# Patient Record
Sex: Female | Born: 1950
Health system: Southern US, Community
[De-identification: ages and names within clinical notes are randomized; demographics above are authoritative.]

## PROBLEM LIST (undated history)

## (undated) DIAGNOSIS — M792 Neuralgia and neuritis, unspecified: Secondary | ICD-10-CM

## (undated) DIAGNOSIS — M48061 Spinal stenosis, lumbar region without neurogenic claudication: Secondary | ICD-10-CM

## (undated) DIAGNOSIS — Z8719 Personal history of other diseases of the digestive system: Secondary | ICD-10-CM

## (undated) DIAGNOSIS — K449 Diaphragmatic hernia without obstruction or gangrene: Secondary | ICD-10-CM

## (undated) DIAGNOSIS — Z86010 Personal history of colonic polyps: Secondary | ICD-10-CM

## (undated) DIAGNOSIS — R112 Nausea with vomiting, unspecified: Secondary | ICD-10-CM

## (undated) DIAGNOSIS — M5136 Other intervertebral disc degeneration, lumbar region: Secondary | ICD-10-CM

## (undated) DIAGNOSIS — Z973 Presence of spectacles and contact lenses: Secondary | ICD-10-CM

## (undated) DIAGNOSIS — M216X9 Other acquired deformities of unspecified foot: Secondary | ICD-10-CM

## (undated) DIAGNOSIS — D126 Benign neoplasm of colon, unspecified: Secondary | ICD-10-CM

## (undated) DIAGNOSIS — F329 Major depressive disorder, single episode, unspecified: Secondary | ICD-10-CM

## (undated) DIAGNOSIS — Z9889 Other specified postprocedural states: Secondary | ICD-10-CM

## (undated) DIAGNOSIS — Z8639 Personal history of other endocrine, nutritional and metabolic disease: Secondary | ICD-10-CM

## (undated) DIAGNOSIS — R9389 Abnormal findings on diagnostic imaging of other specified body structures: Secondary | ICD-10-CM

## (undated) DIAGNOSIS — M8580 Other specified disorders of bone density and structure, unspecified site: Secondary | ICD-10-CM

## (undated) DIAGNOSIS — I10 Essential (primary) hypertension: Secondary | ICD-10-CM

## (undated) DIAGNOSIS — G4700 Insomnia, unspecified: Secondary | ICD-10-CM

## (undated) DIAGNOSIS — K219 Gastro-esophageal reflux disease without esophagitis: Secondary | ICD-10-CM

## (undated) DIAGNOSIS — E785 Hyperlipidemia, unspecified: Secondary | ICD-10-CM

## (undated) DIAGNOSIS — Z860101 Personal history of adenomatous and serrated colon polyps: Secondary | ICD-10-CM

## (undated) DIAGNOSIS — D259 Leiomyoma of uterus, unspecified: Secondary | ICD-10-CM

## (undated) DIAGNOSIS — M7660 Achilles tendinitis, unspecified leg: Secondary | ICD-10-CM

## (undated) DIAGNOSIS — M76891 Other specified enthesopathies of right lower limb, excluding foot: Secondary | ICD-10-CM

## (undated) DIAGNOSIS — N840 Polyp of corpus uteri: Secondary | ICD-10-CM

## (undated) DIAGNOSIS — M67921 Unspecified disorder of synovium and tendon, right upper arm: Secondary | ICD-10-CM

## (undated) DIAGNOSIS — M419 Scoliosis, unspecified: Secondary | ICD-10-CM

## (undated) DIAGNOSIS — M51369 Other intervertebral disc degeneration, lumbar region without mention of lumbar back pain or lower extremity pain: Secondary | ICD-10-CM

## (undated) DIAGNOSIS — Z5189 Encounter for other specified aftercare: Secondary | ICD-10-CM

## (undated) DIAGNOSIS — D219 Benign neoplasm of connective and other soft tissue, unspecified: Secondary | ICD-10-CM

## (undated) DIAGNOSIS — F32A Depression, unspecified: Secondary | ICD-10-CM

## (undated) DIAGNOSIS — F419 Anxiety disorder, unspecified: Secondary | ICD-10-CM

## (undated) DIAGNOSIS — M4800 Spinal stenosis, site unspecified: Secondary | ICD-10-CM

## (undated) DIAGNOSIS — K222 Esophageal obstruction: Secondary | ICD-10-CM

## (undated) HISTORY — DX: Gastro-esophageal reflux disease without esophagitis: K21.9

## (undated) HISTORY — DX: Insomnia, unspecified: G47.00

## (undated) HISTORY — DX: Other specified disorders of bone density and structure, unspecified site: M85.80

## (undated) HISTORY — DX: Essential (primary) hypertension: I10

## (undated) HISTORY — PX: PARATHYROIDECTOMY: SHX19

## (undated) HISTORY — PX: UPPER GASTROINTESTINAL ENDOSCOPY: SHX188

## (undated) HISTORY — DX: Benign neoplasm of connective and other soft tissue, unspecified: D21.9

## (undated) HISTORY — DX: Neuralgia and neuritis, unspecified: M79.2

## (undated) HISTORY — DX: Scoliosis, unspecified: M41.9

## (undated) HISTORY — DX: Major depressive disorder, single episode, unspecified: F32.9

## (undated) HISTORY — PX: COLONOSCOPY: SHX174

## (undated) HISTORY — DX: Benign neoplasm of colon, unspecified: D12.6

## (undated) HISTORY — DX: Spinal stenosis, site unspecified: M48.00

## (undated) HISTORY — DX: Encounter for other specified aftercare: Z51.89

## (undated) HISTORY — DX: Achilles tendinitis, unspecified leg: M76.60

## (undated) HISTORY — DX: Anxiety disorder, unspecified: F41.9

## (undated) HISTORY — DX: Depression, unspecified: F32.A

## (undated) HISTORY — DX: Hyperlipidemia, unspecified: E78.5

---
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
## 1979-01-30 HISTORY — PX: ECTOPIC PREGNANCY SURGERY: SHX613

## 1983-01-30 HISTORY — PX: CYST EXCISION: SHX5701

## 1998-01-29 HISTORY — PX: BREAST REDUCTION SURGERY: SHX8

## 1998-01-29 HISTORY — PX: REDUCTION MAMMAPLASTY: SUR839

## 1998-03-31 ENCOUNTER — Other Ambulatory Visit: Admission: RE | Admit: 1998-03-31 | Discharge: 1998-03-31 | Payer: Self-pay | Admitting: Obstetrics and Gynecology

## 1999-10-13 ENCOUNTER — Other Ambulatory Visit: Admission: RE | Admit: 1999-10-13 | Discharge: 1999-10-13 | Payer: Self-pay | Admitting: Obstetrics and Gynecology

## 2000-10-22 ENCOUNTER — Other Ambulatory Visit: Admission: RE | Admit: 2000-10-22 | Discharge: 2000-10-22 | Payer: Self-pay | Admitting: Obstetrics and Gynecology

## 2001-10-24 ENCOUNTER — Other Ambulatory Visit: Admission: RE | Admit: 2001-10-24 | Discharge: 2001-10-24 | Payer: Self-pay | Admitting: Obstetrics and Gynecology

## 2002-10-27 ENCOUNTER — Other Ambulatory Visit: Admission: RE | Admit: 2002-10-27 | Discharge: 2002-10-27 | Payer: Self-pay | Admitting: Obstetrics and Gynecology

## 2003-02-04 ENCOUNTER — Encounter: Admission: RE | Admit: 2003-02-04 | Discharge: 2003-02-08 | Payer: Self-pay | Admitting: Specialist

## 2004-07-06 ENCOUNTER — Ambulatory Visit: Payer: Self-pay | Admitting: Gastroenterology

## 2005-01-29 HISTORY — PX: OTHER SURGICAL HISTORY: SHX169

## 2005-01-29 HISTORY — PX: SHOULDER SURGERY: SHX246

## 2006-01-29 HISTORY — PX: PARATHYROIDECTOMY: SHX19

## 2006-04-23 ENCOUNTER — Emergency Department (HOSPITAL_COMMUNITY): Admission: EM | Admit: 2006-04-23 | Discharge: 2006-04-23 | Payer: Self-pay | Admitting: Emergency Medicine

## 2008-03-10 ENCOUNTER — Ambulatory Visit (HOSPITAL_COMMUNITY): Admission: RE | Admit: 2008-03-10 | Discharge: 2008-03-10 | Payer: Self-pay | Admitting: Family Medicine

## 2008-03-11 ENCOUNTER — Ambulatory Visit: Payer: Self-pay | Admitting: Gastroenterology

## 2008-05-07 ENCOUNTER — Ambulatory Visit: Payer: Self-pay | Admitting: Gastroenterology

## 2008-09-17 ENCOUNTER — Ambulatory Visit (HOSPITAL_COMMUNITY): Admission: RE | Admit: 2008-09-17 | Discharge: 2008-09-18 | Payer: Self-pay | Admitting: General Surgery

## 2008-09-17 ENCOUNTER — Encounter (INDEPENDENT_AMBULATORY_CARE_PROVIDER_SITE_OTHER): Payer: Self-pay | Admitting: General Surgery

## 2010-02-28 NOTE — Procedures (Signed)
Summary: Colonoscopy   Colonoscopy  Procedure date:  05/07/2008  Findings:      Location:  [facility].    Procedures Next Due Date:    Colonoscopy: 04/2013  COLONOSCOPY PROCEDURE REPORT  PATIENT:  Rebecca Long, Rebecca Long  MR#:  [id number] BIRTHDATE:   [birth year], 57 yrs. old   GENDER:   female  ENDOSCOPIST:   Judie Petit T. Russella Dar, MD, [facility]    PROCEDURE DATE:  05/07/2008 PROCEDURE:  Colonoscopy, diagnostic ASA CLASS:   Class I INDICATIONS: 1) follow-up of polyp, adenomatous polyps, 12/2001.  MEDICATIONS:    Fentanyl 75 mcg IV, Versed 7 mg IV  DESCRIPTION OF PROCEDURE:   After the risks benefits and alternatives of the procedure were thoroughly explained, informed consent was obtained.  Digital rectal exam was performed and revealed no abnormalities.   The LB [id number] endoscope was introduced through the anus and advanced to the cecum, which was identified by both the appendix and ileocecal valve, without limitations.  The quality of the prep was good, using MoviPrep.  The instrument was then slowly withdrawn as the colon was fully examined. <<PROCEDUREIMAGES>>              <<OLD IMAGES>>  FINDINGS:  A normal appearing cecum, ileocecal valve, and appendiceal orifice were identified. The ascending, hepatic flexure, transverse, splenic flexure descending, sigmoid colon, and rectum appeared unremarkable. Retroflexed views in the rectum revealed no abnormalities. The time to cecum =  2.75  minutes. The scope was then withdrawn (time =  12.5  min) from the patient and the procedure completed.  COMPLICATIONS:   None  ENDOSCOPIC IMPRESSION:  1) Normal colon    REPEAT EXAM:   In 5 year(s) for Colonoscopy.   Venita Lick. Russella Dar, MD, [facility]    CC: Herb Grays MD

## 2010-02-28 NOTE — Miscellaneous (Signed)
Summary: RECALL COL..EM  Clinical Lists Changes  Medications: Added new medication of MOVIPREP 100 GM  SOLR (PEG-KCL-NACL-NASULF-NA ASC-C) As directed. - Signed Rx of MOVIPREP 100 GM  SOLR (PEG-KCL-NACL-NASULF-NA ASC-C) As directed.;  #1 x 0;  Signed;  Entered by: Clide Cliff RN;  Authorized by: Marland Kitchen LGIDEFAULTPROVIDER;  Method used: Print then Give to Patient Observations: Added new observation of ALLERGY REV: Done (03/11/2008 13:11)    Prescriptions: MOVIPREP 100 GM  SOLR (PEG-KCL-NACL-NASULF-NA ASC-C) As directed.  #1 x 0   Entered by:   Clide Cliff RN   Authorized by:   Marland Kitchen LGIDEFAULTPROVIDER   Signed by:   Clide Cliff RN on 03/11/2008   Method used:   Print then Give to Patient   RxID:   [id number]

## 2010-05-06 LAB — DIFFERENTIAL
Basophils Absolute: 0.1 10*3/uL (ref 0.0–0.1)
Basophils Relative: 1 % (ref 0–1)
Eosinophils Absolute: 0.2 10*3/uL (ref 0.0–0.7)
Eosinophils Relative: 3 % (ref 0–5)
Lymphocytes Relative: 44 % (ref 12–46)
Lymphs Abs: 2.5 10*3/uL (ref 0.7–4.0)
Monocytes Absolute: 0.4 10*3/uL (ref 0.1–1.0)
Monocytes Relative: 7 % (ref 3–12)
Neutro Abs: 2.6 10*3/uL (ref 1.7–7.7)
Neutrophils Relative %: 45 % (ref 43–77)

## 2010-05-06 LAB — BASIC METABOLIC PANEL
BUN: 14 mg/dL (ref 6–23)
CO2: 27 mEq/L (ref 19–32)
Calcium: 10 mg/dL (ref 8.4–10.5)
Chloride: 110 mEq/L (ref 96–112)
Creatinine, Ser: 0.76 mg/dL (ref 0.4–1.2)
GFR calc Af Amer: >60 mL/min (ref >60)
GFR calc non Af Amer: >60 mL/min (ref >60)
Glucose, Bld: 88 mg/dL (ref 70–99)
Potassium: 4.2 mEq/L (ref 3.5–5.1)
Sodium: 140 mEq/L (ref 135–145)

## 2010-05-06 LAB — CALCIUM
Calcium: 9 mg/dL (ref 8.4–10.5)
Calcium: 9.9 mg/dL (ref 8.4–10.5)

## 2010-05-06 LAB — CBC
HCT: 41.8 % (ref 36.0–46.0)
Hemoglobin: 14.3 g/dL (ref 12.0–15.0)
MCHC: 34.2 g/dL (ref 30.0–36.0)
MCV: 88.4 fL (ref 78.0–100.0)
Platelets: 216 10*3/uL (ref 150–400)
RBC: 4.72 MIL/uL (ref 3.87–5.11)
RDW: 13.4 % (ref 11.5–15.5)
WBC: 5.8 10*3/uL (ref 4.0–10.5)

## 2010-05-06 LAB — VITAMIN D 1,25 DIHYDROXY
Vitamin D 1, 25 (OH)2 Total: 28 pg/mL (ref 18–72)
Vitamin D2 1, 25 (OH)2: <8 pg/mL
Vitamin D3 1, 25 (OH)2: 28 pg/mL

## 2010-06-13 NOTE — Op Note (Signed)
Rebecca Long, Rebecca Long                 ACCOUNT NO.:  [id number]   MEDICAL RECORD NO.:  [id number]          PATIENT TYPE:  AMB   LOCATION:  SDS                          FACILITY:  [facility]   PHYSICIAN:  Lennie Muckle, MD      DATE OF BIRTH:  [birth year]   DATE OF PROCEDURE:  09/17/2008  DATE OF DISCHARGE:                               OPERATIVE REPORT   PREOPERATIVE DIAGNOSIS:  Hyperparathyroidism with left parathyroid  adenoma.   POSTOPERATIVE DIAGNOSIS:  Hyperparathyroidism with left parathyroid  adenoma.   PROCEDURE:  Parathyroidectomy, right superior pole of thyroid.   SURGEON:  Amber L. Freida Busman, MD   ASSISTANT:  Velora Heckler, MD   FINDINGS:  Large parathyroid at superior pole of the thyroid gland.   SPECIMEN:  Parathyroid pathology.   ESTIMATED BLOOD LOSS:  Minimal blood loss.   COMPLICATIONS:  No immediate complications.   INDICATIONS FOR PROCEDURE:  Rebecca Long is a 60 year old female who had a  mild elevation of calcium.  She had had a full workup which included  elevated PTH of 85.  She had a nuclear study which revealed uptake in  the left superior pole of the thyroid gland.  She had been on  supplementation for hypocalcemia and a complaint of neck pain.  I  presumed she had had a parathyroid adenoma.  She initially tried  conservative management, however, elected to proceed with the  parathyroidectomy.  Her informed consent was obtained after explaining  the risks of surgery including but not limited to bleeding, injury to  recurrent laryngeal nerve, and continued hypercalcemia.  All questions  were answered.   PROCEDURE IN DETAIL:  Rebecca Long was identified in the preoperative  holding area.  I marked on the left side of her neck.  She was then  taken to the operating room and placed in supine position.  After  administration of general endotracheal anesthesia, a towel roll was  placed under her neck.  Her anterior neck was prepped and draped in  usual sterile fashion.   A time-out procedure indicating the patient and  procedure were performed.  I placed an incision 2 fingerbreadths above  the sternal notch, divided the subcutaneous tissues with electrocautery,  and elevated skin flaps with the platysma muscle after dividing the  platysma muscle.  This was carried superiorly to the cricothyroid notch  and inferiorly down to the clavicle.  We divided the midline strap  muscles.  Dissected the strap muscles off the left lobe of the thyroid  gland and continued dissecting up near the superior pole of the thyroid.  We were able to find parathyroid tissue superiorly.  This was rather  large in size.  I clipped the small feeding vessels and used a harmonic  scalpel to divide close to the parathyroid gland.  Specimen was easily  removed, dissected in small pieces and sent this for frozen.  This was  consistent with a parathyroid.  The remaining specimen was sent for  permanent section.  We irrigated the wound bed, found no bleeding,  placed Surgicel within the wound bed  and closed the strap muscle in an  interrupted fashion using 3-0 Vicryl.  Platysma was reapproximated using  3-0 Vicryl.  Dermis was closed with  3-0 Vicryl and skin was closed with 4-0 Monocryl.  Steri-Strips were  placed for final dressing.  The patient was extubated and transferred to  Postanesthesia Care Unit in stable condition.  I will check a calcium  level today and in the morning likely place her on Tums 2 tablets 3  times a day and likely she will be discharged home tomorrow.      Lennie Muckle, MD  Electronically Signed     ALA/MEDQ  D:  09/17/2008  T:  09/18/2008  Job:  [id number]   cc:   Tammy R. Collins [place], M.D.

## 2012-09-09 ENCOUNTER — Encounter: Payer: Self-pay | Admitting: Sports Medicine

## 2012-09-09 ENCOUNTER — Ambulatory Visit (INDEPENDENT_AMBULATORY_CARE_PROVIDER_SITE_OTHER): Payer: No Typology Code available for payment source | Admitting: Sports Medicine

## 2012-09-09 VITALS — BP 133/88 | HR 71 | Ht 63.0 in | Wt 160.0 lb

## 2012-09-09 DIAGNOSIS — M5137 Other intervertebral disc degeneration, lumbosacral region: Secondary | ICD-10-CM

## 2012-09-09 DIAGNOSIS — M216X9 Other acquired deformities of unspecified foot: Secondary | ICD-10-CM

## 2012-09-09 DIAGNOSIS — M5136 Other intervertebral disc degeneration, lumbar region: Secondary | ICD-10-CM | POA: Insufficient documentation

## 2012-09-09 DIAGNOSIS — M48061 Spinal stenosis, lumbar region without neurogenic claudication: Secondary | ICD-10-CM | POA: Insufficient documentation

## 2012-09-09 HISTORY — DX: Other acquired deformities of unspecified foot: M21.6X9

## 2012-09-09 NOTE — Assessment & Plan Note (Signed)
To get her back into a walking program I think she is going to require custom orthotics  We need to get better arch support bilaterally and correction of the curvature on the left

## 2012-09-09 NOTE — Patient Instructions (Addendum)
Pelvic tilts - 5 x 5 breaths  Curl ups - 5 x 5 Lt Knee to Rt shoulder Rt knee to left shoulder Both knees to chest  Modified sit ups - goal is 10 and gradually lower  Find aerobic exercise that workss - try to build to 30 mins most days but be sure and get some walking  Massage is good  Heat wraps is good  Up the gabapentin to 2 at night Gradually wean ambien  Do these for 6 weeks

## 2012-09-09 NOTE — Progress Notes (Signed)
Patient ID: Rebecca Long, female   DOB: [birth year], 62 y.o.   MRN: [id number]  This patient has been trouble with low back pain for more than 2 years She originally had seen Dr. Shelle Iron at [facility] orthopedics for right hip pain. She did have problems with a labral tear and trochanteric bursitis as well as some hip muscle tendinitis that were documented by MR arthrogram. Treatment for the hip conditions did not get rid of her pain that radiated into her buttocks and down into her legs. She underwent further evaluation which revealed that she had significant lumbar spinal stenosis. She also had multilevel degenerative disc changes. She had some acquired left convex scoliosis. At several levels she had some nerve root irritation.  For the past 2 years she continues to use gabapentin just at nighttime. She has Doing certain things but is now unable to walk very far without pain. She recently took a trip to [place] and coming back as severe flare of her low back pain. This led her to come seek additional evaluation and treatment.  She did have an upper respiratory infection about one week before the flare of back pain.  She also had a urinary tract infection treated with Cipro at about the time of back pain.  Dr. Jolee Ewing treats her for her medical problems and her medications are as listed.  PE  Pleasant female in no acute distress she has moderate centripetal obesity  Back extension to 20 creates some pain Back flexion is full Left lateral bending is pain free Right lateral band creates pain down her right outside leg Rotation of the low back is pain-free  She can do heel toe and tandem walk without problems  Straight leg raise is negative bilaterally  Hip abduction and flexion strength is strong bilaterally  There is atrophy that extends into both feet particularly of the intrinsic muscles of the left foot There is abnormal rotation and inversion of the left foot suggesting some chronic  peroneal nerve weakness Bilaterally she has a cavus foot  Pelvis is anteriorly rotated, right  Leg lengths are equal

## 2012-09-09 NOTE — Assessment & Plan Note (Signed)
I suggested increasing her dose of gabapentin  We may consider adding tramadol but she does not feel that her pain is significant at this time

## 2012-09-09 NOTE — Assessment & Plan Note (Signed)
We need to begin a series of consistent exercises to improve the position of her pelvis, strengthen her abdominal muscles, preserved her aerobic capacity  I think she is a good candidate for conservative care

## 2012-10-09 ENCOUNTER — Ambulatory Visit: Payer: Self-pay | Admitting: Sports Medicine

## 2012-10-21 ENCOUNTER — Ambulatory Visit (INDEPENDENT_AMBULATORY_CARE_PROVIDER_SITE_OTHER): Payer: No Typology Code available for payment source | Admitting: Sports Medicine

## 2012-10-21 ENCOUNTER — Encounter: Payer: Self-pay | Admitting: Sports Medicine

## 2012-10-21 VITALS — BP 146/65 | HR 79 | Ht 63.0 in | Wt 160.0 lb

## 2012-10-21 DIAGNOSIS — M549 Dorsalgia, unspecified: Secondary | ICD-10-CM

## 2012-10-21 DIAGNOSIS — Q667 Congenital pes cavus, unspecified foot: Secondary | ICD-10-CM

## 2012-10-21 DIAGNOSIS — M48061 Spinal stenosis, lumbar region without neurogenic claudication: Secondary | ICD-10-CM

## 2012-10-21 NOTE — Progress Notes (Signed)
  Subjective:    Patient ID: Rebecca Long, female    DOB: 03-Apr-1950, 62 y.o.   MRN: [id number]  HPI Ms. Horvath is a 62 year old female who is here to f/u on her back pain. She was last seen about 6 weeks ago at which time her gabapentin dose was increased from 300 to 600 mg qHS. She thought this would help her sleep and not use ambien, but the increased dose of gabapentin did not seem to help. She does, however, feel that her back has gotten a lot better. She has been able to increase her walking duration to 30 minutes, and has been working on pilates. She will occasionally have a twinge in her back if she sits in a weird position, but she no longer has any numbness or tingling in her legs or shooting pains down to her toes.   She is concerned about her foot because she was told she has some nerve damage. She had orthotics made a few years ago that she does not wear because the hard plastic bothers her. Instead, she got some over the counter orthotics which seem to help. She did recently buy a new pair of walking shoes.    Review of Systems     Objective:   Physical Exam  Constitutional: She is oriented to person, place, and time. She appears well-developed and well-nourished.  HENT:  Head: Normocephalic and atraumatic.  Eyes: Conjunctivae are normal.  Pulmonary/Chest: Effort normal.  Neurological: She is alert and oriented to person, place, and time.  Psychiatric: She has a normal mood and affect. Her behavior is normal.   No midline or paraspinal back pain. Normal bilateral hip ROM and strength.  3+ patellar reflexes, 2+ plantar reflexes. Equal sensation to light touch bilaterally.  Pes cavus deformity bilaterally, L > R.  Curvature of left foot with varus deformity. Tarsometatarsal bossing bilaterally, L > R. Medial displacement of 5th rays with gap formation between left rays 1&2 Atrophy of left ankle and foot/ less on RT       Assessment & Plan:  62 year old female with back pain  secondary to degenerative disc disease and spinal stenosis, also with pes cavus and left foot deformity 2/2 to L5 nerve root compression  - Back pain improved, will continue 300 mg gabapentin qHS. Titrate up to 600 mg if desired at next visit. Epidural CSI did not help last time, will not pursue at this time - Provided patient with scaphoid pads to support arch in her shoes today - She will return on Thursday or Friday with her walking shoes and we will place either arch pads vs. full insoles for better support  F/U in 3 months

## 2012-10-21 NOTE — Assessment & Plan Note (Signed)
Cont the conservative care program  See visit note  HEP/ gabapentin  Needs better foot support

## 2013-02-02 LAB — BASIC METABOLIC PANEL
BUN: 17 mg/dL (ref 4–21)
Creatinine: 0.7 mg/dL (ref 0.5–1.1)
Potassium: 4.4 mmol/L (ref 3.4–5.3)
Sodium: 139 mmol/L (ref 137–147)

## 2013-02-02 LAB — CBC AND DIFFERENTIAL
HCT: 44 % (ref 36–46)
Hemoglobin: 14.3 g/dL (ref 12.0–16.0)
Neutrophils Absolute: 3 /uL
Platelets: 252 10*3/uL (ref 150–399)
WBC: 5.3 10^3/mL

## 2013-02-02 LAB — HEPATIC FUNCTION PANEL
ALT: 62 U/L — AB (ref 7–35)
AST: 29 U/L (ref 13–35)
Alkaline Phosphatase: 44 U/L (ref 25–125)

## 2013-02-02 LAB — LIPID PANEL
Cholesterol: 283 mg/dL — AB (ref 0–200)
HDL: 67 mg/dL (ref 35–70)
LDL Cholesterol: 175 mg/dL
Triglycerides: 160 mg/dL (ref 40–160)

## 2013-02-02 LAB — TSH: TSH: 2.57 u[IU]/mL (ref 0.41–5.90)

## 2013-02-02 LAB — HEMOGLOBIN A1C: Hgb A1c MFr Bld: 5.4 % (ref 4.0–6.0)

## 2013-03-12 ENCOUNTER — Encounter: Payer: Self-pay | Admitting: Gastroenterology

## 2013-05-05 ENCOUNTER — Ambulatory Visit: Payer: No Typology Code available for payment source | Admitting: Sports Medicine

## 2013-06-17 ENCOUNTER — Encounter: Payer: Self-pay | Admitting: Gastroenterology

## 2013-08-14 ENCOUNTER — Ambulatory Visit (AMBULATORY_SURGERY_CENTER): Payer: BC Managed Care – PPO | Admitting: *Deleted

## 2013-08-14 VITALS — Ht 64.0 in | Wt 169.8 lb

## 2013-08-14 DIAGNOSIS — Z860100 Personal history of colon polyps, unspecified: Secondary | ICD-10-CM

## 2013-08-14 MED ORDER — MOVIPREP 100 G PO SOLR: ORAL | Status: DC

## 2013-08-14 NOTE — Progress Notes (Signed)
No allergies to eggs or soy. No problems with anesthesia.  Pt given Emmi instructions for colonoscopy  No oxygen use  No diet drug use  

## 2013-08-17 ENCOUNTER — Encounter: Payer: Self-pay | Admitting: Gastroenterology

## 2013-08-28 ENCOUNTER — Ambulatory Visit (AMBULATORY_SURGERY_CENTER): Payer: BC Managed Care – PPO | Admitting: Gastroenterology

## 2013-08-28 ENCOUNTER — Encounter: Payer: Self-pay | Admitting: Gastroenterology

## 2013-08-28 VITALS — BP 118/75 | HR 61 | Temp 98.0°F | Resp 19 | Ht 64.0 in | Wt 169.0 lb

## 2013-08-28 DIAGNOSIS — Z860100 Personal history of colon polyps, unspecified: Secondary | ICD-10-CM

## 2013-08-28 MED ORDER — SODIUM CHLORIDE 0.9 % IV SOLN: 500.0000 mL | INTRAVENOUS | Status: DC

## 2013-08-28 NOTE — Progress Notes (Signed)
Report to PACU, RN, vss, BBS= Clear.  

## 2013-08-28 NOTE — Patient Instructions (Signed)
YOU HAD AN ENDOSCOPIC PROCEDURE TODAY AT THE [facility]: Refer to the procedure report that was given to you for any specific questions about what was found during the examination.  If the procedure report does not answer your questions, please call your gastroenterologist to clarify.  If you requested that your care partner not be given the details of your procedure findings, then the procedure report has been included in a sealed envelope for you to review at your convenience later.  YOU SHOULD EXPECT: Some feelings of bloating in the abdomen. Passage of more gas than usual.  Walking can help get rid of the air that was put into your GI tract during the procedure and reduce the bloating. If you had a lower endoscopy (such as a colonoscopy or flexible sigmoidoscopy) you may notice spotting of blood in your stool or on the toilet paper. If you underwent a bowel prep for your procedure, then you may not have a normal bowel movement for a few days.  DIET: Your first meal following the procedure should be a light meal and then it is ok to progress to your normal diet.  A half-sandwich or bowl of soup is an example of a good first meal.  Heavy or fried foods are harder to digest and may make you feel nauseous or bloated.  Likewise meals heavy in dairy and vegetables can cause extra gas to form and this can also increase the bloating.  Drink plenty of fluids but you should avoid alcoholic beverages for 24 hours.  ACTIVITY: Your care partner should take you home directly after the procedure.  You should plan to take it easy, moving slowly for the rest of the day.  You can resume normal activity the day after the procedure however you should NOT DRIVE or use heavy machinery for 24 hours (because of the sedation medicines used during the test).    SYMPTOMS TO REPORT IMMEDIATELY: A gastroenterologist can be reached at any hour.  During normal business hours, 8:30 AM to 5:00 PM Monday through Friday,  call [phone].  After hours and on weekends, please call the GI answering service at [phone] who will take a message and have the physician on call contact you.   Following lower endoscopy (colonoscopy or flexible sigmoidoscopy):  Excessive amounts of blood in the stool  Significant tenderness or worsening of abdominal pains  Swelling of the abdomen that is new, acute  Fever of 100F or higher  FOLLOW UP: If any biopsies were taken you will be contacted by phone or by letter within the next 1-3 weeks.  Call your gastroenterologist if you have not heard about the biopsies in 3 weeks.  Our staff will call the home number listed on your records the next business day following your procedure to check on you and address any questions or concerns that you may have at that time regarding the information given to you following your procedure. This is a courtesy call and so if there is no answer at the home number and we have not heard from you through the emergency physician on call, we will assume that you have returned to your regular daily activities without incident.  SIGNATURES/CONFIDENTIALITY: You and/or your care partner have signed paperwork which will be entered into your electronic medical record.  These signatures attest to the fact that that the information above on your After Visit Summary has been reviewed and is understood.  Full responsibility of the confidentiality of this   discharge information lies with you and/or your care-partner.  Recommendations High fiber diet with liberal fluid intake. Repeat colonoscopy in 5 years.

## 2013-08-28 NOTE — Op Note (Signed)
[place]  [address] [place], [id number]   COLONOSCOPY PROCEDURE REPORT  PATIENT: Rebecca Long, Rebecca Long  MR#: [id number] BIRTHDATE: 05/08/1950 , 62  yrs. old GENDER: Female ENDOSCOPIST: Ladene Artist, MD, [facility] PROCEDURE DATE:  08/28/2013 PROCEDURE:   Colonoscopy, surveillance First Screening Colonoscopy - Avg.  risk and is 50 yrs.  old or older - No.  Prior Negative Screening - Now for repeat screening. N/A  History of Adenoma - Now for follow-up colonoscopy & has been > or = to 3 yrs.  Yes hx of adenoma.  Has been 3 or more years since last colonoscopy.  Polyps Removed Today? No.  Recommend repeat exam, <10 yrs? Yes.  High risk (family or personal hx). ASA CLASS:   Class III INDICATIONS:Patient's personal history of adenomatous colon polyps.  MEDICATIONS: MAC sedation, administered by CRNA and propofol (Diprivan) 240mg  IV DESCRIPTION OF PROCEDURE:   After the risks benefits and alternatives of the procedure were thoroughly explained, informed consent was obtained.  A digital rectal exam revealed no abnormalities of the perianal region.   The LB [id number] endoscope was introduced through the anus and advanced to the cecum, which was identified by both the appendix and ileocecal valve. No adverse events experienced.   The quality of the prep was excellent, using MoviPrep  The instrument was then slowly withdrawn as the colon was fully examined.  COLON FINDINGS: Mild diverticulosis was noted in the sigmoid colon. The colon was otherwise normal.  There was no diverticulosis, inflammation, polyps or cancers unless previously stated. Retroflexed views revealed no abnormalities. The time to cecum=3 minutes 34 seconds.  Withdrawal time=7 minutes 24 seconds.  The scope was withdrawn and the procedure completed.  COMPLICATIONS: There were no complications.  ENDOSCOPIC IMPRESSION: 1.   Mild diverticulosis in the sigmoid colon 2.   The colon was otherwise  normal  RECOMMENDATIONS: 1.  High fiber diet with liberal fluid intake. 2.  Repeat Colonoscopy in 5 years.  eSigned:  Ladene Artist, MD, [facility] 08/28/2013 11:19 AM   cc: Florina Ou, MD

## 2013-08-31 ENCOUNTER — Telehealth: Payer: Self-pay | Admitting: *Deleted

## 2013-08-31 NOTE — Telephone Encounter (Signed)
  Follow up Call-  Call back number 08/28/2013  Post procedure Call Back phone  # [phone]  Permission to leave phone message Yes     Patient questions:  Do you have a fever, pain , or abdominal swelling? No. Pain Score  0 *  Have you tolerated food without any problems? Yes.    Have you been able to return to your normal activities? Yes.    Do you have any questions about your discharge instructions: Diet   No. Medications  No. Follow up visit  No.  Do you have questions or concerns about your Care? No.  Actions: * If pain score is 4 or above: No action needed, pain <4.

## 2013-09-14 ENCOUNTER — Encounter: Payer: Self-pay | Admitting: Gastroenterology

## 2014-04-29 LAB — HEPATIC FUNCTION PANEL
ALT: 29 U/L (ref 7–35)
AST: 22 U/L (ref 13–35)
Alkaline Phosphatase: 53 U/L (ref 25–125)

## 2014-04-29 LAB — BASIC METABOLIC PANEL
BUN: 15 mg/dL (ref 4–21)
Creatinine: 0.8 mg/dL (ref 0.5–1.1)
Glucose: 88 mg/dL
Potassium: 4.2 mmol/L (ref 3.4–5.3)
Sodium: 139 mmol/L (ref 137–147)

## 2014-04-29 LAB — LIPID PANEL
Cholesterol: 172 mg/dL (ref 0–200)
HDL: 62 mg/dL (ref 35–70)
LDL Cholesterol: 89 mg/dL
Triglycerides: 113 mg/dL (ref 40–160)

## 2014-04-29 LAB — TSH: TSH: 1.49 u[IU]/mL (ref 0.41–5.90)

## 2014-10-05 DIAGNOSIS — Z85828 Personal history of other malignant neoplasm of skin: Secondary | ICD-10-CM | POA: Diagnosis not present

## 2014-10-05 DIAGNOSIS — D485 Neoplasm of uncertain behavior of skin: Secondary | ICD-10-CM | POA: Diagnosis not present

## 2014-10-05 DIAGNOSIS — D1722 Benign lipomatous neoplasm of skin and subcutaneous tissue of left arm: Secondary | ICD-10-CM | POA: Diagnosis not present

## 2014-10-05 DIAGNOSIS — D2262 Melanocytic nevi of left upper limb, including shoulder: Secondary | ICD-10-CM | POA: Diagnosis not present

## 2014-10-05 DIAGNOSIS — L821 Other seborrheic keratosis: Secondary | ICD-10-CM | POA: Diagnosis not present

## 2014-10-05 DIAGNOSIS — D225 Melanocytic nevi of trunk: Secondary | ICD-10-CM | POA: Diagnosis not present

## 2014-10-06 ENCOUNTER — Encounter: Payer: Self-pay | Admitting: Family Medicine

## 2014-10-06 ENCOUNTER — Ambulatory Visit (INDEPENDENT_AMBULATORY_CARE_PROVIDER_SITE_OTHER): Payer: Medicare Other | Admitting: Family Medicine

## 2014-10-06 VITALS — BP 133/85 | HR 63 | Temp 97.7°F | Resp 18 | Ht 62.5 in | Wt 174.0 lb

## 2014-10-06 DIAGNOSIS — M4806 Spinal stenosis, lumbar region: Secondary | ICD-10-CM

## 2014-10-06 DIAGNOSIS — F418 Other specified anxiety disorders: Secondary | ICD-10-CM | POA: Insufficient documentation

## 2014-10-06 DIAGNOSIS — M810 Age-related osteoporosis without current pathological fracture: Secondary | ICD-10-CM | POA: Insufficient documentation

## 2014-10-06 DIAGNOSIS — Z0000 Encounter for general adult medical examination without abnormal findings: Secondary | ICD-10-CM

## 2014-10-06 DIAGNOSIS — I10 Essential (primary) hypertension: Secondary | ICD-10-CM | POA: Diagnosis not present

## 2014-10-06 DIAGNOSIS — R5383 Other fatigue: Secondary | ICD-10-CM

## 2014-10-06 DIAGNOSIS — M8580 Other specified disorders of bone density and structure, unspecified site: Secondary | ICD-10-CM

## 2014-10-06 DIAGNOSIS — M48061 Spinal stenosis, lumbar region without neurogenic claudication: Secondary | ICD-10-CM

## 2014-10-06 DIAGNOSIS — N393 Stress incontinence (female) (male): Secondary | ICD-10-CM | POA: Insufficient documentation

## 2014-10-06 DIAGNOSIS — E2839 Other primary ovarian failure: Secondary | ICD-10-CM

## 2014-10-06 MED ORDER — ATORVASTATIN CALCIUM 10 MG PO TABS: 10.0000 mg | ORAL_TABLET | Freq: Every day | ORAL | Status: DC

## 2014-10-06 MED ORDER — LOSARTAN POTASSIUM 100 MG PO TABS: 100.0000 mg | ORAL_TABLET | Freq: Every day | ORAL | Status: DC

## 2014-10-06 NOTE — Progress Notes (Signed)
Pre visit review using our clinic review tool, if applicable. No additional management support is needed unless otherwise documented below in the visit note. 

## 2014-10-06 NOTE — Progress Notes (Signed)
Subjective:    Patient ID: Rebecca Long, female    DOB: February 25, 1950, 64 y.o.   MRN: [id number]  HPI  Patient transferring from [facility] in [place]. All PMH, FH, SurgHx, Allergies and social hx entered. Awaiting requested records for immunization history.   Anxiety/depression: evaluated by Jobie Quaker, PA (psych) every 6 months, stable, meds are prescribed through psychiatry office include: BuSpar 15 mg 2 times a day, Prozac 40 mg daily and Ambien 5 mg when necessary daily at bedtime.  Spinal stenosis/DDD lumbar: Patient follows with Dr. Oneida Alar, stable currently, with flares few times a year, driving long periods of time exacerbations symptoms. She frequently will experience radiation of pain to her right leg.  MRI 09/11/2012 of lumbar spine and right hip arthrogram are scanned into the system and have been reviewed. Patient with extensive degenerative disc disease, nerve encroachment and lumbar spinal stenosis, also with mildly prepare a right hip at that time. Patient does attempt to keep active by swimming 2 times a week. She reports when she does have a flare she needs to rest, take prednisone, and use Vicodin 5/325.   Hypertension: Patient states that her medication has been unchanged for quite some time. She currently takes certain 782 mg daily. She does not routinely take her blood pressures at home, but she does have a cough and is able to do so if needed. She does not watch the salt content in her diet. She attempts to stay active and swims twice a week, exercises difficult with her spinal stenosis/degenerative disc disease. She has not been walking as much, and reports she is going get back into that now that the weather is cooling down. She denies any chest pain, visual changes, orthopnea, dizziness or LE edema.   Fatigue: Patient states that she's had fatigue for some time. She has had low vitamin D in the past, and attempts to supplement daily with her vitamin D. She also suffers  from depression, and sees psychiatry and feels stable. She has gained weight recently, but states that she is not as active as she used to be. She denies any heat or cold intolerance. She denies any polyphagia or polydipsia.  Osteopenia:She has a history of low vitamin D and osteopenia, which she attempts to take daily vitamin D and calcium. She states her last bone density scan was when she was about 25, and at that time she states that they told her she had osteopenia. She no longer is a smoker. She is postmenopausal, therefore she is estrogen deficient. She denies any fractures.  Mild incontinence: Patient states that she does have mild stress incontinence. She reports urinary leakage with coughing and sneezing. She states it's very mild, and she currently does not wear a pad, she also denies ever trying any type of therapy. She does feel like this has become worse over the last year. She has had urinary tract infections in the past, but nothing recently.  Health maintenance:  Colonoscopy:UTD, h/o of polyps, no fhx of colon cancer. Every 5 years, due 2020. Mammogram: UTD, history breast reduction breast biopsy, no family history of breast cancer. Last mammogram April 2016, patient reported as normal, no EMR record. Cervical cancer screening: UTD. Patient with no abnormal Paps, last Pap within the last 3 years. Negative HPV. No further cervical cancer screening indicated. Patient not sexually active. Immunizations: Unknown tetanus and pneumonia vaccination records. Records have been requested from prior PCP. Infectious disease screening: Unknown infectious disease screening, records have been  requested from prior PCP   Review of Systems  Constitutional: Positive for fatigue. Negative for fever, chills, diaphoresis, activity change, appetite change and unexpected weight change.  HENT: Negative for congestion, ear discharge, facial swelling, hearing loss, postnasal drip, sinus pressure, sneezing,  sore throat, tinnitus, trouble swallowing and voice change.   Eyes: Negative for redness and visual disturbance.  Respiratory: Negative for cough, chest tightness, shortness of breath and wheezing.   Cardiovascular: Negative for chest pain, palpitations and leg swelling.  Gastrointestinal: Negative for nausea, vomiting, abdominal pain, diarrhea, constipation, blood in stool and abdominal distention.  Endocrine: Negative for polyphagia and polyuria.  Genitourinary: Negative for dysuria, hematuria, flank pain, decreased urine volume, vaginal bleeding, vaginal discharge, difficulty urinating and pelvic pain.  Musculoskeletal: Positive for back pain. Negative for neck pain.  Skin: Negative for rash and wound.  Allergic/Immunologic: Negative.  Negative for environmental allergies and food allergies.  Neurological: Negative for dizziness, tremors, syncope, weakness, numbness and headaches.  Hematological: Negative.   Psychiatric/Behavioral: Negative for suicidal ideas, hallucinations, behavioral problems, confusion and sleep disturbance. The patient is not nervous/anxious.        Objective:   Physical Exam BP 133/85 mmHg  Pulse 63  Temp(Src) 97.7 F (36.5 C) (Temporal)  Resp 18  Ht 5' 2.5" (1.588 m)  Wt 174 lb (78.926 kg)  BMI 31.30 kg/m2  SpO2 98% Gen: Afebrile. No acute distress. Nontoxic in appearance, well-developed, well-nourished Caucasian female. Very pleasant, cooperative with exam. HENT: AT. . Bilateral TM visualized and normal in appearance. MMM. Bilateral nares without erythema or swelling. Throat without erythema or exudates.  Eyes:Pupils Equal Round Reactive to light, Extraocular movements intact,  Conjunctiva without redness, discharge or icterus. Neck: Supple, no lymphadenopathy, no thyromegaly CV: RRR no murmur, no edema, +2/4 P posterior tibialis pulses Chest: CTAB, no wheeze or crackles Abd: Soft. Round. NTND. BS present. No Masses palpated.  Skin: No rashes, purpura  or petechiae.  Neuro/MSK: Normal gait. PERLA. EOMi. Alert and Oriented x3.  Muscle strength 5/5 UE/LE extremity.  Psych: Normal affect, dress and demeanor. Normal speech. Normal thought content and judgment..      Assessment & Plan:   1. Essential hypertension, benign - Stable on current medication, refills prescribed, continue current regimen. - Losartan 100 mg daily #90, 3 refills - Patient to continue monitoring blood pressure occasionally at home, watch for signs and symptoms of hyper or hypotension. - Discussed goal blood pressure should be below 140/90. - Follow-up in six-month to one-year, dependent on home blood pressure readings.  2. Depression with anxiety - Patient followed every 6 months with psychiatry physician assistant. - Currently stable. - All medications are prescribed through her psychiatrist office. BuSpar, Prozac, Ambien.  3. Stress incontinence - Seems to be mild, patient understands if this increases, she experiences frequent urinary tract infections or she would like to discuss possible treatments she will make an appointment. - Discussed today possible future urology evaluation versus pessaries. Versus behavior modifications.  4. Osteoporosis - h/o of osteopenia on prior bone density scan not available in the system. Patient is female, Caucasian, Estrogen deficient and former smoker. - Continue calcium and vitamin D supplementation - Will recheck vitamin D level with other future labs, once we get records from prior office. They have been requested for the second time today. - Repeat bone density ordered today. Consider preventive or treatment therapy if osteopenic/worsening scan.  5. Other fatigue - Once records from office become available, or if we do not receive records within  2 weeks, will obtain vitamin D, TSH, CBC and CMP.  6. Spinal stenosis of lumbar region - Reviewed MRI reports, and prior management. - Currently stable. Continue tolerable  physical exercise, swimming is an excellent form of exercise. - We'll continue to follow up with Dr. Oneida Alar as needed, if desired. May also be managed here.  - If increasing symptoms or flares become more frequent will need repeat imaging and possible neurosurgical referral. Patient's goal is to refrain from surgical referral as long as possible.  7. Health care maintenance Colonoscopy:UTD, due 2020. Mammogram: UTD; April 2017 due Cervical cancer screening: UTD. No further cervical cancer screening indicated greater than 32 of age, with normal Paps. Immunizations: Unknown tetanus and pneumonia vaccination records. Records have been requested from prior PCP. Patient will be called if need of vaccination, and will schedule nurse visit for immunization. Flu vaccination given today. Infectious disease screening: Unknown infectious disease screening, records have been requested from prior PCP. Patient will be called if need of vaccination, and will schedule nurse visit for immunization.

## 2014-10-06 NOTE — Patient Instructions (Signed)

## 2014-10-11 ENCOUNTER — Other Ambulatory Visit: Payer: Self-pay | Admitting: Neurosurgery

## 2014-10-11 DIAGNOSIS — G930 Cerebral cysts: Secondary | ICD-10-CM

## 2014-10-25 ENCOUNTER — Telehealth: Payer: Self-pay | Admitting: Family Medicine

## 2014-10-25 NOTE — Telephone Encounter (Signed)
LMOM for pt to CB.  

## 2014-10-25 NOTE — Telephone Encounter (Signed)
Patient aware.

## 2014-10-25 NOTE — Telephone Encounter (Signed)
Have you received records yet?  Please advise.

## 2014-10-25 NOTE — Telephone Encounter (Signed)
I understand she would like to have her PNA vaccination. We discussed I would like to receive her prior immunization records first, as I do not which one or when she had a vaccination to PNA, if any. I have not received these records as of yet. - - She can also attempt to get these records for [place].  - If we get them we will call her immediately to schedule vaccinations if needed.  - It is not recommended to repeat the same vaccination within 5 years, and there are two different types which need to be timed at least 1 year apart form each other.  - If she is certain she has not had a PNA vac in 5 years then we can schedule a PCV13 and then in 1 year she can have the [place].

## 2014-10-25 NOTE — Telephone Encounter (Signed)
Pt. Would like a pneumo shot. Dr. Raoul Pitch said she wanted to see Renley's records from Dr. Greta Doom office before giving it. Have we received these records and can she have a vaccine Please call her.

## 2014-10-26 ENCOUNTER — Encounter: Payer: Self-pay | Admitting: Family Medicine

## 2014-10-26 ENCOUNTER — Telehealth: Payer: Self-pay | Admitting: Family Medicine

## 2014-10-26 DIAGNOSIS — R5382 Chronic fatigue, unspecified: Secondary | ICD-10-CM

## 2014-10-26 LAB — VITAMIN D 25 HYDROXY (VIT D DEFICIENCY, FRACTURES)
Vitamin D, 25-OH, D2: 1
Vitamin D, 25-OH, D3: 44
Vitamin D, 25-OH, Total: 45

## 2014-10-26 LAB — VITAMIN B12

## 2014-10-26 NOTE — Telephone Encounter (Signed)
LMOM for pt to CB.  

## 2014-10-26 NOTE — Telephone Encounter (Signed)
Pt aware.  She states that she thinks she has had a lot of these labs checked within this year but she will double check before repeating labs.   She states she will let me know on Friday when she comes into office for injections.

## 2014-10-26 NOTE — Telephone Encounter (Signed)
Pt: scheduled an appt for immunizations on 9/30 at 2 pm with the nurse.  - I have received her records and it appears she is due for both the PCV13 (and then PPSV23 1 year later) and the TDAP vaccinations (last Td was 10/05/2004, every 10 years). Please make pt aware. - I have also reviewed recent labs and it appears her thyroid was recently tested. Since she did have complaints of fatigue I would like to obtain a repeat vit D, B12, ESR, ANA and CBC to investigate further.  - we can collect these on her nurse visit date, if abnormal results would need to followup with provider visit to discuss. -  If normal results would follow up on other chronic issues/annual visit end of March/April (her annual visit with her prior PCP was 04/29/2014), unless  Needed sooner.

## 2014-10-29 ENCOUNTER — Ambulatory Visit
Admission: RE | Admit: 2014-10-29 | Discharge: 2014-10-29 | Disposition: A | Discharge status: Home / Self Care | Payer: Medicare Other | Source: Ambulatory Visit | Attending: Neurosurgery | Admitting: Neurosurgery

## 2014-10-29 ENCOUNTER — Ambulatory Visit (INDEPENDENT_AMBULATORY_CARE_PROVIDER_SITE_OTHER): Payer: Medicare Other | Admitting: *Deleted

## 2014-10-29 DIAGNOSIS — G930 Cerebral cysts: Secondary | ICD-10-CM

## 2014-10-29 DIAGNOSIS — Z23 Encounter for immunization: Secondary | ICD-10-CM | POA: Diagnosis not present

## 2014-11-04 DIAGNOSIS — H2513 Age-related nuclear cataract, bilateral: Secondary | ICD-10-CM | POA: Diagnosis not present

## 2014-11-05 DIAGNOSIS — G930 Cerebral cysts: Secondary | ICD-10-CM | POA: Diagnosis not present

## 2014-11-05 DIAGNOSIS — Z6830 Body mass index (BMI) 30.0-30.9, adult: Secondary | ICD-10-CM | POA: Diagnosis not present

## 2015-01-20 DIAGNOSIS — F3341 Major depressive disorder, recurrent, in partial remission: Secondary | ICD-10-CM | POA: Diagnosis not present

## 2015-01-20 DIAGNOSIS — F419 Anxiety disorder, unspecified: Secondary | ICD-10-CM | POA: Diagnosis not present

## 2015-04-30 DIAGNOSIS — Z1231 Encounter for screening mammogram for malignant neoplasm of breast: Secondary | ICD-10-CM | POA: Diagnosis not present

## 2015-05-03 ENCOUNTER — Encounter: Payer: Self-pay | Admitting: Family Medicine

## 2015-05-03 DIAGNOSIS — Z1239 Encounter for other screening for malignant neoplasm of breast: Secondary | ICD-10-CM | POA: Insufficient documentation

## 2015-05-17 DIAGNOSIS — M779 Enthesopathy, unspecified: Secondary | ICD-10-CM | POA: Diagnosis not present

## 2015-05-17 DIAGNOSIS — M79671 Pain in right foot: Secondary | ICD-10-CM | POA: Diagnosis not present

## 2015-05-26 ENCOUNTER — Ambulatory Visit: Payer: Medicare Other | Admitting: Podiatry

## 2015-07-25 ENCOUNTER — Ambulatory Visit (INDEPENDENT_AMBULATORY_CARE_PROVIDER_SITE_OTHER): Payer: Medicare Other | Admitting: Podiatry

## 2015-07-25 ENCOUNTER — Encounter: Payer: Self-pay | Admitting: Podiatry

## 2015-07-25 ENCOUNTER — Ambulatory Visit (INDEPENDENT_AMBULATORY_CARE_PROVIDER_SITE_OTHER): Payer: Medicare Other

## 2015-07-25 VITALS — BP 130/78 | HR 75 | Resp 16

## 2015-07-25 DIAGNOSIS — M79673 Pain in unspecified foot: Secondary | ICD-10-CM

## 2015-07-25 DIAGNOSIS — M2041 Other hammer toe(s) (acquired), right foot: Secondary | ICD-10-CM

## 2015-07-25 DIAGNOSIS — M779 Enthesopathy, unspecified: Secondary | ICD-10-CM

## 2015-07-25 DIAGNOSIS — D169 Benign neoplasm of bone and articular cartilage, unspecified: Secondary | ICD-10-CM

## 2015-07-25 MED ORDER — TRIAMCINOLONE ACETONIDE 10 MG/ML IJ SUSP
10.0000 mg | Freq: Once | INTRAMUSCULAR | Status: AC
  Administered 2015-07-25: 10 mg

## 2015-07-27 NOTE — Progress Notes (Signed)
Subjective:     Patient ID: Rebecca Long, female   DOB: 30-Apr-1950, 65 y.o.   MRN: [id number]  HPI patient presents stating that she's getting a lot of pain in the right foot worse than the left with inflammation around the base of the second toe right over left. States that it's been going on for a while and worse recently   Review of Systems  All other systems reviewed and are negative.      Objective:   Physical Exam  Constitutional: She is oriented to person, place, and time.  Cardiovascular: Intact distal pulses.   Musculoskeletal: Normal range of motion.  Neurological: She is oriented to person, place, and time.  Skin: Skin is warm.  Nursing note and vitals reviewed.  neurovascular status found to be intact muscle strength adequate range of motion within normal limits. Patient's noted to have inflammation and fluid around the second MPJ right over left and spurring formation on top of the foot that's moderately painful. Patient states that this is worsened gradually over the last few months and is making it difficult to walk comfortably. She is noted to have good digital perfusion and is well oriented 3     Assessment:     Inflammatory capsulitis right over left second MPJ with fluid buildup around the joint and moderate exostotic formation    Plan:     H&P and x-ray reviewed with patient. Today I went ahead and I did a proximal nerve block I was able to aspirate the second MPJ and got out amount of clear fluid and injected with a quarter cc dexamethasone Kenalog and applied thick plantar padding. Patient had x-rays reviewed and will be seen back for [place] to recheck and may require further treatment depending on response  X-ray report indicated that there is no indications of stress fracture or advanced arthritic condition

## 2015-08-08 ENCOUNTER — Ambulatory Visit (INDEPENDENT_AMBULATORY_CARE_PROVIDER_SITE_OTHER): Payer: Medicare Other | Admitting: Podiatry

## 2015-08-08 ENCOUNTER — Encounter: Payer: Self-pay | Admitting: Podiatry

## 2015-08-08 DIAGNOSIS — M2041 Other hammer toe(s) (acquired), right foot: Secondary | ICD-10-CM | POA: Diagnosis not present

## 2015-08-08 DIAGNOSIS — M779 Enthesopathy, unspecified: Secondary | ICD-10-CM | POA: Diagnosis not present

## 2015-08-09 NOTE — Progress Notes (Signed)
Subjective:     Patient ID: Rebecca Long, female   DOB: 01/22/1951, 65 y.o.   MRN: [id number]  HPI patient presents stating my right foot is feeling a lot better   Review of Systems     Objective:   Physical Exam Neurovascular status intact muscle strength adequate significant improvement of the right foot with digital deformity and inflamed capsule    Assessment:     Improved capsulitis right    Plan:     Advised on continued pad usage not going barefoot supportive shoes and possibility for orthotics in the future. Explained orthotic usage and reappoint as needed

## 2015-09-26 DIAGNOSIS — F419 Anxiety disorder, unspecified: Secondary | ICD-10-CM | POA: Diagnosis not present

## 2015-09-26 DIAGNOSIS — F3341 Major depressive disorder, recurrent, in partial remission: Secondary | ICD-10-CM | POA: Diagnosis not present

## 2015-10-18 ENCOUNTER — Ambulatory Visit (INDEPENDENT_AMBULATORY_CARE_PROVIDER_SITE_OTHER): Payer: Medicare Other

## 2015-10-18 VITALS — BP 124/72 | HR 58 | Ht 63.0 in | Wt 163.8 lb

## 2015-10-18 DIAGNOSIS — Z0000 Encounter for general adult medical examination without abnormal findings: Secondary | ICD-10-CM

## 2015-10-18 DIAGNOSIS — Z23 Encounter for immunization: Secondary | ICD-10-CM | POA: Diagnosis not present

## 2015-10-18 NOTE — Progress Notes (Signed)
Subjective:   Rebecca Long is a 64 y.o. female who presents for an Initial Medicare Annual Wellness Visit.  The Patient was informed that the wellness visit is to identify future health risk and educate and initiate measures that can reduce risk for increased disease through the lifespan.   Describes health as fair, good or great? Good.   Review of Systems    No ROS.  Medicare Wellness Visit.  Cardiac Risk Factors include: advanced age (>89men, >89 women);dyslipidemia;hypertension;obesity (BMI >30kg/m2) Health Maintenance Pap: Within the last 3 years, normal.  Mammo: 04/2015, normal (dense). Recall 1 year. Mother recently diagnosed with breast cancer (lumpectomy 03/2014).  Dexa: Age 18, osteopenia. Taking Vitamin D and Calcium (occasionally). Bone Density ordered 09/2014. Will call GI to schedule, number provided.  CCS: Colonoscopy 07/2013, mild diverticulosis. Recall 5 years.  Eye Exam: Followed yearly by optometrist, cannot recall name at this time. Dental: Followed every 6 months, UTD. Dr. Marlyce Huge    Sleep patterns: Typically sleeps 8-9 hours nightly (uses Ambien), up x 1 to void. Uses night guard to prevent teeth grinding.   Home Safety/Smoke Alarms and environment: Lives at home with husband and pet. Feels safe at home, smoke detectors in place, multi-level home, no difficulty with steps currently.  Firearm Safety: No firearms in home.  Seat Belt Safety/Bike Helmet: Wears seatbelt.       Objective:    Today's Vitals   10/18/15 1049  BP: 124/72  Pulse: (!) 58  SpO2: 97%  Weight: 163 lb 12.8 oz (74.3 kg)  Height: 5\' 3"  (1.6 m)   Body mass index is 29.02 kg/m.   Current Medications (verified) Outpatient Encounter Prescriptions as of 10/18/2015  Medication Sig  . aspirin 81 MG chewable tablet Chew 81 mg by mouth daily.  Marland Kitchen atorvastatin (LIPITOR) 10 MG tablet Take 1 tablet (10 mg total) by mouth daily.  . calcium acetate (PHOSLO) 667 MG capsule Take by mouth as needed.   . cholecalciferol (VITAMIN D) 1000 UNITS tablet Take 1,000 Units by mouth daily.  Marland Kitchen FLUoxetine (PROZAC) 40 MG capsule Take 40 mg by mouth daily.  Marland Kitchen losartan (COZAAR) 100 MG tablet Take 1 tablet (100 mg total) by mouth daily.  . Multiple Vitamin (MULTIVITAMIN) tablet Take 1 tablet by mouth daily.  Marland Kitchen zolpidem (AMBIEN) 5 MG tablet Take 5 mg by mouth at bedtime as needed.  . [DISCONTINUED] busPIRone (BUSPAR) 15 MG tablet Take 15 mg by mouth 2 (two) times daily. Reported on 07/25/2015   No facility-administered encounter medications on file as of 10/18/2015.     Allergies (verified) Latex and Polysporin [bacitracin-polymyxin b]   History: Past Medical History:  Diagnosis Date  . Achilles tendinitis   . Anxiety   . Blood transfusion without reported diagnosis 1980   ectopic pregnancy  . Colon polyps    Benign, routine colonoscopy found  . Depression   . Hyperlipidemia   . Hypertension   . Insomnia   . Nerve pain    back  . Osteopenia   . Scoliosis   . Spinal stenosis    Past Surgical History:  Procedure Laterality Date  . BREAST REDUCTION SURGERY Bilateral 2000  . BREAST SURGERY    . CYST EXCISION  1985   head and rt axilla  . ECTOPIC PREGNANCY SURGERY  1981  . frozen shoulder Right 2007  . PARATHYROIDECTOMY  2008   Family History  Problem Relation Age of Onset  . Heart disease Father 31  . Cancer Mother   .  Colon cancer Neg Hx    Social History   Occupational History  . Not on file.   Social History Main Topics  . Smoking status: Former Smoker    Quit date: 01/30/1991  . Smokeless tobacco: Never Used  . Alcohol use No  . Drug use: No  . Sexual activity: No    Tobacco Counseling Counseling given: Not Answered   Activities of Daily Living In your present state of health, do you have any difficulty performing the following activities: 10/18/2015  Hearing? N  Vision? N  Difficulty concentrating or making decisions? N  Walking or climbing stairs? N  Dressing  or bathing? N  Doing errands, shopping? N  Preparing Food and eating ? N  Using the Toilet? N  In the past six months, have you accidently leaked urine? Y  Do you have problems with loss of bowel control? N  Managing your Medications? N  Managing your Finances? N  Housekeeping or managing your Housekeeping? N  Some recent data might be hidden    Immunizations and Health Maintenance Immunization History  Administered Date(s) Administered  . Influenza,inj,Quad PF,36+ Mos 10/06/2014  . Influenza-Unspecified 10/15/2012  . Pneumococcal Conjugate-13 10/29/2014  . Td 10/24/2004  . Tdap 10/29/2014   Health Maintenance Due  Topic Date Due  . Hepatitis C Screening  07/23/1950  . DEXA SCAN  09/29/2009  . INFLUENZA VACCINE  08/30/2015    Patient Care Team: Ma Hillock, DO as PCP - General (Family Medicine) Wallene Huh, DPM as Consulting Physician (Podiatry) Sydnee Levans, MD as Consulting Physician (Dermatology)  Indicate any recent Medical Services you may have received from other than [facility] providers in the past year (date may be approximate).     Assessment:   This is a routine wellness examination for Rebecca Long. Physical assessment deferred to PCP.   Hearing/Vision screen Hearing Screening Comments: Hears conversational tones without difficulty.  Vision Screening Comments: Followed by optometry   Dietary issues and exercise activities discussed: Current Exercise Habits: Home exercise routine, Type of exercise: Other - see comments Environmental consultant (type of tennis)), Time (Minutes): > 60, Frequency (Times/Week): 3, Weekly Exercise (Minutes/Week): 0, Exercise limited by: cardiac condition(s);orthopedic condition(s) Diet (meal preparation, eat out, water intake, caffeinated beverages, dairy products, fruits and vegetables): Eats at least 3 meals/day, Drinks 3 cups coffee/day. Diet Coke,20 oz/daily. Sweet tea. Does not like water.  Breakfast: Skips breakfast. Occasionally will  have smoothie.  Lunch: Leftovers, eats out 2-3 x per week. Dinner:  Dance movement psychotherapist, vegetables, salads, starch/grain.    Goals    . Weight (lb) < 150 lb (68 kg)          Plans to increase walking to mailbox, continue playing "pickle ball", will incorporate resistant bands at least twice a week.       Depression Screen PHQ 2/9 Scores 10/18/2015  PHQ - 2 Score 0    Fall Risk Fall Risk  10/18/2015  Falls in the past year? No  Risk for fall due to : History of fall(s)  Risk for fall due to (comments): Fell down steps-concussion 2011   Ad8 score reviewed for issues:  Issues making decisions: no  Less interest in hobbies / activities:no  Repeats questions, stories (family complaining):no  Trouble using ordinary gadgets (microwave, computer, phone):no  Forgets the month or year: no  Mismanaging finances: no  Remembering appts:no  Daily problems with thinking and/or memory: no Ad8 score is=0     Cognitive Function: MMSE - Mini Mental  State Exam 10/18/2015  Not completed: (No Data)       Screening Tests Health Maintenance  Topic Date Due  . Hepatitis C Screening  25-Jul-1950  . DEXA SCAN  09/29/2009  . INFLUENZA VACCINE  08/30/2015  . PNA vac Low Risk Adult (2 of 2 - PPSV23) 10/29/2015  . MAMMOGRAM  04/29/2017  . COLONOSCOPY  08/29/2018  . TETANUS/TDAP  10/28/2024  . ZOSTAVAX  Completed      Plan:     Begin to eat heart healthy diet (full of fruits, vegetables, whole grains, lean protein, water--limit salt, fat, and sugar intake) and increase physical activity as tolerated.  Begin doing brain stimulating activities (puzzles, reading, adult coloring books, staying active) to keep memory sharp.   Schedule Bone Density Study, call [phone] [facility])  Follow up with Dr. Raoul Pitch in 7-10 days for follow up on chronic conditions.   Bring a copy of your advanced directives to your next office visit.  Patient reports occasional (twice a month) heart  racing that last less than 30 seconds. Patient is not concerned, thinks it is probably anxiety related. Will discuss with Dr. Raoul Pitch at next office visit (2 weeks).   During the course of the visit, Rebecca Long was educated and counseled about the following appropriate screening and preventive services:   Vaccines to include Pneumoccal, Influenza, Hepatitis B, Td, Zostavax, HCV  Cardiovascular disease screening  Colorectal cancer screening  Bone density screening  Diabetes screening  Glaucoma screening  Mammography/PAP  Nutrition counseling  Smoking cessation counseling  Patient Instructions (the written plan) were given to the patient.    Gerilyn Nestle, RN   10/18/2015   Electronically Signed by: Howard Pouch, DO [facility] [place]

## 2015-10-18 NOTE — Patient Instructions (Addendum)
Begin to eat heart healthy diet (full of fruits, vegetables, whole grains, lean protein, water--limit salt, fat, and sugar intake) and increase physical activity as tolerated.  Begin doing brain stimulating activities (puzzles, reading, adult coloring books, staying active) to keep memory sharp.   Schedule Bone Density Study, call [phone] [facility]) Follow up with Dr. Raoul Pitch in 7-10 days for follow up on chronic conditions.   Bring a copy of your advanced directives to your next office visit.     Bone Densitometry Bone densitometry is an imaging test that uses a special X-ray to measure the amount of calcium and other minerals in your bones (bone density). This test is also known as a bone mineral density test or dual-energy X-ray absorptiometry (DXA). The test can measure bone density at your hip and your spine. It is similar to having a regular X-ray. You may have this test to:  Diagnose a condition that causes weak or thin bones (osteoporosis).  Predict your risk of a broken bone (fracture).  Determine how well osteoporosis treatment is working. LET [facility] CARE PROVIDER KNOW ABOUT:  Any allergies you have.  All medicines you are taking, including vitamins, herbs, eye drops, creams, and over-the-counter medicines.  Previous problems you or members of your family have had with the use of anesthetics.  Any blood disorders you have.  Previous surgeries you have had.  Medical conditions you have.  Possibility of pregnancy.  Any other medical test you had within the previous 14 days that used contrast material. RISKS AND COMPLICATIONS Generally, this is a safe procedure. However, problems can occur and may include the following:  This test exposes you to a very small amount of radiation.  The risks of radiation exposure may be greater to unborn children. BEFORE THE PROCEDURE  Do not take any calcium supplements for 24 hours before having the test. You  can otherwise eat and drink what you usually do.  Take off all metal jewelry, eyeglasses, dental appliances, and any other metal objects. PROCEDURE  You may lie on an exam table. There will be an X-ray generator below you and an imaging device above you.  Other devices, such as boxes or braces, may be used to position your body properly for the scan.  You will need to lie still while the machine slowly scans your body.  The images will show up on a computer monitor. AFTER THE PROCEDURE You may need more testing at a later time.   This information is not intended to replace advice given to you by your health care provider. Make sure you discuss any questions you have with your health care provider.   Document Released: 02/07/2004 Document Revised: 02/05/2014 Document Reviewed: 06/25/2013 Elsevier Interactive Patient Education 2016 [employer]   Fat and Cholesterol Restricted Diet Getting too much fat and cholesterol in your diet may cause health problems. Following this diet helps keep your fat and cholesterol at normal levels. This can keep you from getting sick. WHAT TYPES OF FAT SHOULD I CHOOSE?  Choose monosaturated and polyunsaturated fats. These are found in foods such as olive oil, canola oil, flaxseeds, walnuts, almonds, and seeds.  Eat more omega-3 fats. Good choices include salmon, mackerel, sardines, tuna, flaxseed oil, and ground flaxseeds.  Limit saturated fats. These are in animal products such as meats, butter, and cream. They can also be in plant products such as palm oil, palm kernel oil, and coconut oil.   Avoid foods with partially hydrogenated oils in them.  These contain trans fats. Examples of foods that have trans fats are stick margarine, some tub margarines, cookies, crackers, and other baked goods. WHAT GENERAL GUIDELINES DO I NEED TO FOLLOW?   Check food labels. Look for the words "trans fat" and "saturated fat."  When preparing a meal:  Fill half of  your plate with vegetables and green salads.  Fill one fourth of your plate with whole grains. Look for the word "whole" as the first word in the ingredient list.  Fill one fourth of your plate with lean protein foods.  Limit fruit to two servings a day. Choose fruit instead of juice.  Eat more foods with soluble fiber. Examples of foods with this type of fiber are apples, broccoli, carrots, beans, peas, and barley. Try to get 20-30 g (grams) of fiber per day.  Eat more home-cooked foods. Eat less at restaurants and buffets.  Limit or avoid alcohol.  Limit foods high in starch and sugar.  Limit fried foods.  Cook foods without frying them. Baking, boiling, grilling, and broiling are all great options.  Lose weight if you are overweight. Losing even a small amount of weight can help your overall health. It can also help prevent diseases such as diabetes and heart disease. WHAT FOODS CAN I EAT? Grains Whole grains, such as whole wheat or whole grain breads, crackers, cereals, and pasta. Unsweetened oatmeal, bulgur, barley, quinoa, or brown rice. Corn or whole wheat flour tortillas. Vegetables Fresh or frozen vegetables (raw, steamed, roasted, or grilled). Green salads. Fruits All fresh, canned (in natural juice), or frozen fruits. Meat and Other Protein Products Ground beef (85% or leaner), grass-fed beef, or beef trimmed of fat. Skinless chicken or [place]. Ground chicken or [place]. Pork trimmed of fat. All fish and seafood. Eggs. Dried beans, peas, or lentils. Unsalted nuts or seeds. Unsalted canned or dry beans. Dairy Low-fat dairy products, such as skim or 1% milk, 2% or reduced-fat cheeses, low-fat ricotta or cottage cheese, or plain low-fat yogurt. Fats and Oils Tub margarines without trans fats. Light or reduced-fat mayonnaise and salad dressings. Avocado. Olive, canola, sesame, or safflower oils. Natural peanut or almond butter (choose ones without added sugar and oil). The  items listed above may not be a complete list of recommended foods or beverages. Contact your dietitian for more options. WHAT FOODS ARE NOT RECOMMENDED? Grains White bread. White pasta. White rice. Cornbread. Bagels, pastries, and croissants. Crackers that contain trans fat. Vegetables White potatoes. Corn. Creamed or fried vegetables. Vegetables in a cheese sauce. Fruits Dried fruits. Canned fruit in light or heavy syrup. Fruit juice. Meat and Other Protein Products Fatty cuts of meat. Ribs, chicken wings, bacon, sausage, bologna, salami, chitterlings, fatback, hot dogs, bratwurst, and packaged luncheon meats. Liver and organ meats. Dairy Whole or 2% milk, cream, half-and-half, and cream cheese. Whole milk cheeses. Whole-fat or sweetened yogurt. Full-fat cheeses. Nondairy creamers and whipped toppings. Processed cheese, cheese spreads, or cheese curds. Sweets and Desserts Corn syrup, sugars, honey, and molasses. Candy. Jam and jelly. Syrup. Sweetened cereals. Cookies, pies, cakes, donuts, muffins, and ice cream. Fats and Oils Butter, stick margarine, lard, shortening, ghee, or bacon fat. Coconut, palm kernel, or palm oils. Beverages Alcohol. Sweetened drinks (such as sodas, lemonade, and fruit drinks or punches). The items listed above may not be a complete list of foods and beverages to avoid. Contact your dietitian for more information.   This information is not intended to replace advice given to you by your health care  provider. Make sure you discuss any questions you have with your health care provider.   Document Released: 07/17/2011 Document Revised: 02/05/2014 Document Reviewed: 04/16/2013 Elsevier Interactive Patient Education 2016 [place] in the Home  Falls can cause injuries. They can happen to people of all ages. There are many things you can do to make your home safe and to help prevent falls.  WHAT CAN I DO ON THE OUTSIDE OF MY HOME?  Regularly  fix the edges of walkways and driveways and fix any cracks.  Remove anything that might make you trip as you walk through a door, such as a raised step or threshold.  Trim any bushes or trees on the path to your home.  Use bright outdoor lighting.  Clear any walking paths of anything that might make someone trip, such as rocks or tools.  Regularly check to see if handrails are loose or broken. Make sure that both sides of any steps have handrails.  Any raised decks and porches should have guardrails on the edges.  Have any leaves, snow, or ice cleared regularly.  Use sand or salt on walking paths during winter.  Clean up any spills in your garage right away. This includes oil or grease spills. WHAT CAN I DO IN THE BATHROOM?   Use night lights.  Install grab bars by the toilet and in the tub and shower. Do not use towel bars as grab bars.  Use non-skid mats or decals in the tub or shower.  If you need to sit down in the shower, use a plastic, non-slip stool.  Keep the floor dry. Clean up any water that spills on the floor as soon as it happens.  Remove soap buildup in the tub or shower regularly.  Attach bath mats securely with double-sided non-slip rug tape.  Do not have throw rugs and other things on the floor that can make you trip. WHAT CAN I DO IN THE BEDROOM?  Use night lights.  Make sure that you have a light by your bed that is easy to reach.  Do not use any sheets or blankets that are too big for your bed. They should not hang down onto the floor.  Have a firm chair that has side arms. You can use this for support while you get dressed.  Do not have throw rugs and other things on the floor that can make you trip. WHAT CAN I DO IN THE KITCHEN?  Clean up any spills right away.  Avoid walking on wet floors.  Keep items that you use a lot in easy-to-reach places.  If you need to reach something above you, use a strong step stool that has a grab bar.  Keep  electrical cords out of the way.  Do not use floor polish or wax that makes floors slippery. If you must use wax, use non-skid floor wax.  Do not have throw rugs and other things on the floor that can make you trip. WHAT CAN I DO WITH MY STAIRS?  Do not leave any items on the stairs.  Make sure that there are handrails on both sides of the stairs and use them. Fix handrails that are broken or loose. Make sure that handrails are as long as the stairways.  Check any carpeting to make sure that it is firmly attached to the stairs. Fix any carpet that is loose or worn.  Avoid having throw rugs at the top or bottom of the stairs.  If you do have throw rugs, attach them to the floor with carpet tape.  Make sure that you have a light switch at the top of the stairs and the bottom of the stairs. If you do not have them, ask someone to add them for you. WHAT ELSE CAN I DO TO HELP PREVENT FALLS?  Wear shoes that:  Do not have high heels.  Have rubber bottoms.  Are comfortable and fit you well.  Are closed at the toe. Do not wear sandals.  If you use a stepladder:  Make sure that it is fully opened. Do not climb a closed stepladder.  Make sure that both sides of the stepladder are locked into place.  Ask someone to hold it for you, if possible.  Clearly mark and make sure that you can see:  Any grab bars or handrails.  First and last steps.  Where the edge of each step is.  Use tools that help you move around (mobility aids) if they are needed. These include:  Canes.  Walkers.  Scooters.  Crutches.  Turn on the lights when you go into a dark area. Replace any light bulbs as soon as they burn out.  Set up your furniture so you have a clear path. Avoid moving your furniture around.  If any of your floors are uneven, fix them.  If there are any pets around you, be aware of where they are.  Review your medicines with your doctor. Some medicines can make you feel dizzy.  This can increase your chance of falling. Ask your doctor what other things that you can do to help prevent falls.   This information is not intended to replace advice given to you by your health care provider. Make sure you discuss any questions you have with your health care provider.   Document Released: 11/11/2008 Document Revised: 06/01/2014 Document Reviewed: 02/19/2014 Elsevier Interactive Patient Education 2016 [place] Maintenance, Female Adopting a healthy lifestyle and getting preventive care can go a long way to promote health and wellness. Talk with your health care provider about what schedule of regular examinations is right for you. This is a good chance for you to check in with your provider about disease prevention and staying healthy. In between checkups, there are plenty of things you can do on your own. Experts have done a lot of research about which lifestyle changes and preventive measures are most likely to keep you healthy. Ask your health care provider for more information. WEIGHT AND DIET  Eat a healthy diet  Be sure to include plenty of vegetables, fruits, low-fat dairy products, and lean protein.  Do not eat a lot of foods high in solid fats, added sugars, or salt.  Get regular exercise. This is one of the most important things you can do for your health.  Most adults should exercise for at least 150 minutes each week. The exercise should increase your heart rate and make you sweat (moderate-intensity exercise).  Most adults should also do strengthening exercises at least twice a week. This is in addition to the moderate-intensity exercise.  Maintain a healthy weight  Body mass index (BMI) is a measurement that can be used to identify possible weight problems. It estimates body fat based on height and weight. Your health care provider can help determine your BMI and help you achieve or maintain a healthy weight.  For females 40 years of age and  older:   A BMI below 18.5 is  considered underweight.  A BMI of 18.5 to 24.9 is normal.  A BMI of 25 to 29.9 is considered overweight.  A BMI of 30 and above is considered obese.  Watch levels of cholesterol and blood lipids  You should start having your blood tested for lipids and cholesterol at 66 years of age, then have this test every 5 years.  You may need to have your cholesterol levels checked more often if:  Your lipid or cholesterol levels are high.  You are older than 66 years of age.  You are at high risk for heart disease.  CANCER SCREENING   Lung Cancer  Lung cancer screening is recommended for adults 54-78 years old who are at high risk for lung cancer because of a history of smoking.  A yearly low-dose CT scan of the lungs is recommended for people who:  Currently smoke.  Have quit within the past 15 years.  Have at least a 30-pack-year history of smoking. A pack year is smoking an average of one pack of cigarettes a day for 1 year.  Yearly screening should continue until it has been 15 years since you quit.  Yearly screening should stop if you develop a health problem that would prevent you from having lung cancer treatment.  Breast Cancer  Practice breast self-awareness. This means understanding how your breasts normally appear and feel.  It also means doing regular breast self-exams. Let your health care provider know about any changes, no matter how small.  If you are in your 20s or 30s, you should have a clinical breast exam (CBE) by a health care provider every 1-3 years as part of a regular health exam.  If you are 73 or older, have a CBE every year. Also consider having a breast X-ray (mammogram) every year.  If you have a family history of breast cancer, talk to your health care provider about genetic screening.  If you are at high risk for breast cancer, talk to your health care provider about having an MRI and a mammogram every  year.  Breast cancer gene (BRCA) assessment is recommended for women who have family members with BRCA-related cancers. BRCA-related cancers include:  Breast.  Ovarian.  Tubal.  Peritoneal cancers.  Results of the assessment will determine the need for genetic counseling and BRCA1 and BRCA2 testing. Cervical Cancer Your health care provider may recommend that you be screened regularly for cancer of the pelvic organs (ovaries, uterus, and vagina). This screening involves a pelvic examination, including checking for microscopic changes to the surface of your cervix (Pap test). You may be encouraged to have this screening done every 3 years, beginning at age 62.  For women ages 11-65, health care providers may recommend pelvic exams and Pap testing every 3 years, or they may recommend the Pap and pelvic exam, combined with testing for human papilloma virus (HPV), every 5 years. Some types of HPV increase your risk of cervical cancer. Testing for HPV may also be done on women of any age with unclear Pap test results.  Other health care providers may not recommend any screening for nonpregnant women who are considered low risk for pelvic cancer and who do not have symptoms. Ask your health care provider if a screening pelvic exam is right for you.  If you have had past treatment for cervical cancer or a condition that could lead to cancer, you need Pap tests and screening for cancer for at least 20 years after your treatment. If  Pap tests have been discontinued, your risk factors (such as having a new sexual partner) need to be reassessed to determine if screening should resume. Some women have medical problems that increase the chance of getting cervical cancer. In these cases, your health care provider may recommend more frequent screening and Pap tests. Colorectal Cancer  This type of cancer can be detected and often prevented.  Routine colorectal cancer screening usually begins at 66 years of  age and continues through 66 years of age.  Your health care provider may recommend screening at an earlier age if you have risk factors for colon cancer.  Your health care provider may also recommend using home test kits to check for hidden blood in the stool.  A small camera at the end of a tube can be used to examine your colon directly (sigmoidoscopy or colonoscopy). This is done to check for the earliest forms of colorectal cancer.  Routine screening usually begins at age 40.  Direct examination of the colon should be repeated every 5-10 years through 66 years of age. However, you may need to be screened more often if early forms of precancerous polyps or small growths are found. Skin Cancer  Check your skin from head to toe regularly.  Tell your health care provider about any new moles or changes in moles, especially if there is a change in a mole's shape or color.  Also tell your health care provider if you have a mole that is larger than the size of a pencil eraser.  Always use sunscreen. Apply sunscreen liberally and repeatedly throughout the day.  Protect yourself by wearing long sleeves, pants, a wide-brimmed hat, and sunglasses whenever you are outside. HEART DISEASE, DIABETES, AND HIGH BLOOD PRESSURE   High blood pressure causes heart disease and increases the risk of stroke. High blood pressure is more likely to develop in:  People who have blood pressure in the high end of the normal range (130-139/85-89 mm Hg).  People who are overweight or obese.  People who are African American.  If you are 63-65 years of age, have your blood pressure checked every 3-5 years. If you are 21 years of age or older, have your blood pressure checked every year. You should have your blood pressure measured twice--once when you are at a hospital or clinic, and once when you are not at a hospital or clinic. Record the average of the two measurements. To check your blood pressure when you are  not at a hospital or clinic, you can use:  An automated blood pressure machine at a pharmacy.  A home blood pressure monitor.  If you are between 33 years and 85 years old, ask your health care provider if you should take aspirin to prevent strokes.  Have regular diabetes screenings. This involves taking a blood sample to check your fasting blood sugar level.  If you are at a normal weight and have a low risk for diabetes, have this test once every three years after 66 years of age.  If you are overweight and have a high risk for diabetes, consider being tested at a younger age or more often. PREVENTING INFECTION  Hepatitis B  If you have a higher risk for hepatitis B, you should be screened for this virus. You are considered at high risk for hepatitis B if:  You were born in a country where hepatitis B is common. Ask your health care provider which countries are considered high risk.  Your parents  were born in a high-risk country, and you have not been immunized against hepatitis B (hepatitis B vaccine).  You have HIV or AIDS.  You use needles to inject street drugs.  You live with someone who has hepatitis B.  You have had sex with someone who has hepatitis B.  You get hemodialysis treatment.  You take certain medicines for conditions, including cancer, organ transplantation, and autoimmune conditions. Hepatitis C  Blood testing is recommended for:  Everyone born from [birth year] through 1966.  Anyone with known risk factors for hepatitis C. Sexually transmitted infections (STIs)  You should be screened for sexually transmitted infections (STIs) including gonorrhea and chlamydia if:  You are sexually active and are younger than 66 years of age.  You are older than 66 years of age and your health care provider tells you that you are at risk for this type of infection.  Your sexual activity has changed since you were last screened and you are at an increased risk for  chlamydia or gonorrhea. Ask your health care provider if you are at risk.  If you do not have HIV, but are at risk, it may be recommended that you take a prescription medicine daily to prevent HIV infection. This is called pre-exposure prophylaxis (PrEP). You are considered at risk if:  You are sexually active and do not regularly use condoms or know the HIV status of your partner(s).  You take drugs by injection.  You are sexually active with a partner who has HIV. Talk with your health care provider about whether you are at high risk of being infected with HIV. If you choose to begin PrEP, you should first be tested for HIV. You should then be tested every 3 months for as long as you are taking PrEP.  PREGNANCY   If you are premenopausal and you may become pregnant, ask your health care provider about preconception counseling.  If you may become pregnant, take 400 to 800 micrograms (mcg) of folic acid every day.  If you want to prevent pregnancy, talk to your health care provider about birth control (contraception). OSTEOPOROSIS AND MENOPAUSE   Osteoporosis is a disease in which the bones lose minerals and strength with aging. This can result in serious bone fractures. Your risk for osteoporosis can be identified using a bone density scan.  If you are 57 years of age or older, or if you are at risk for osteoporosis and fractures, ask your health care provider if you should be screened.  Ask your health care provider whether you should take a calcium or vitamin D supplement to lower your risk for osteoporosis.  Menopause may have certain physical symptoms and risks.  Hormone replacement therapy may reduce some of these symptoms and risks. Talk to your health care provider about whether hormone replacement therapy is right for you.  HOME CARE INSTRUCTIONS   Schedule regular health, dental, and eye exams.  Stay current with your immunizations.   Do not use any tobacco products  including cigarettes, chewing tobacco, or electronic cigarettes.  If you are pregnant, do not drink alcohol.  If you are breastfeeding, limit how much and how often you drink alcohol.  Limit alcohol intake to no more than 1 drink per day for nonpregnant women. One drink equals 12 ounces of beer, 5 ounces of wine, or 1 ounces of hard liquor.  Do not use street drugs.  Do not share needles.  Ask your health care provider for help if you need  support or information about quitting drugs.  Tell your health care provider if you often feel depressed.  Tell your health care provider if you have ever been abused or do not feel safe at home.   This information is not intended to replace advice given to you by your health care provider. Make sure you discuss any questions you have with your health care provider.   Document Released: 07/31/2010 Document Revised: 02/05/2014 Document Reviewed: 12/17/2012 Elsevier Interactive Patient Education [employer].

## 2015-11-01 ENCOUNTER — Ambulatory Visit (INDEPENDENT_AMBULATORY_CARE_PROVIDER_SITE_OTHER): Payer: Medicare Other | Admitting: Family Medicine

## 2015-11-01 ENCOUNTER — Encounter: Payer: Self-pay | Admitting: Family Medicine

## 2015-11-01 VITALS — BP 136/88 | HR 62 | Temp 97.7°F | Resp 20 | Ht 63.0 in | Wt 165.5 lb

## 2015-11-01 DIAGNOSIS — I10 Essential (primary) hypertension: Secondary | ICD-10-CM | POA: Diagnosis not present

## 2015-11-01 DIAGNOSIS — Z1159 Encounter for screening for other viral diseases: Secondary | ICD-10-CM | POA: Diagnosis not present

## 2015-11-01 DIAGNOSIS — E785 Hyperlipidemia, unspecified: Secondary | ICD-10-CM | POA: Diagnosis not present

## 2015-11-01 DIAGNOSIS — Z23 Encounter for immunization: Secondary | ICD-10-CM | POA: Diagnosis not present

## 2015-11-01 MED ORDER — ATORVASTATIN CALCIUM 10 MG PO TABS: 10.0000 mg | ORAL_TABLET | Freq: Every day | ORAL | 3 refills | Status: DC

## 2015-11-01 MED ORDER — LOSARTAN POTASSIUM 100 MG PO TABS: 100.0000 mg | ORAL_TABLET | Freq: Every day | ORAL | 1 refills | Status: DC

## 2015-11-01 NOTE — Patient Instructions (Addendum)
Make an appt within week. For lab (fasting).  Make bone density appt, it is ordered.   Low salt and exercise (or play more pickle ball).   Hypertension Hypertension, commonly called high blood pressure, is when the force of blood pumping through your arteries is too strong. Your arteries are the blood vessels that carry blood from your heart throughout your body. A blood pressure reading consists of a higher number over a lower number, such as 110/72. The higher number (systolic) is the pressure inside your arteries when your heart pumps. The lower number (diastolic) is the pressure inside your arteries when your heart relaxes. Ideally you want your blood pressure below 120/80. Hypertension forces your heart to work harder to pump blood. Your arteries may become narrow or stiff. Having untreated or uncontrolled hypertension can cause heart attack, stroke, kidney disease, and other problems. RISK FACTORS Some risk factors for high blood pressure are controllable. Others are not.  Risk factors you cannot control include:   Race. You may be at higher risk if you are African American.  Age. Risk increases with age.  Gender. Men are at higher risk than women before age 15 years. After age 35, women are at higher risk than men. Risk factors you can control include:  Not getting enough exercise or physical activity.  Being overweight.  Getting too much fat, sugar, calories, or salt in your diet.  Drinking too much alcohol. SIGNS AND SYMPTOMS Hypertension does not usually cause signs or symptoms. Extremely high blood pressure (hypertensive crisis) may cause headache, anxiety, shortness of breath, and nosebleed. DIAGNOSIS To check if you have hypertension, your health care provider will measure your blood pressure while you are seated, with your arm held at the level of your heart. It should be measured at least twice using the same arm. Certain conditions can cause a difference in blood  pressure between your right and left arms. A blood pressure reading that is higher than normal on one occasion does not mean that you need treatment. If it is not clear whether you have high blood pressure, you may be asked to return on a different day to have your blood pressure checked again. Or, you may be asked to monitor your blood pressure at home for 1 or more weeks. TREATMENT Treating high blood pressure includes making lifestyle changes and possibly taking medicine. Living a healthy lifestyle can help lower high blood pressure. You may need to change some of your habits. Lifestyle changes may include:  Following the DASH diet. This diet is high in fruits, vegetables, and whole grains. It is low in salt, red meat, and added sugars.  Keep your sodium intake below 2,300 mg per day.  Getting at least 30-45 minutes of aerobic exercise at least 4 times per week.  Losing weight if necessary.  Not smoking.  Limiting alcoholic beverages.  Learning ways to reduce stress. Your health care provider may prescribe medicine if lifestyle changes are not enough to get your blood pressure under control, and if one of the following is true:  You are 24-60 years of age and your systolic blood pressure is above 140.  You are 43 years of age or older, and your systolic blood pressure is above 150.  Your diastolic blood pressure is above 90.  You have diabetes, and your systolic blood pressure is over [id number] or your diastolic blood pressure is over 90.  You have kidney disease and your blood pressure is above 140/90.  You have  heart disease and your blood pressure is above 140/90. Your personal target blood pressure may vary depending on your medical conditions, your age, and other factors. HOME CARE INSTRUCTIONS  Have your blood pressure rechecked as directed by your health care provider.   Take medicines only as directed by your health care provider. Follow the directions carefully. Blood  pressure medicines must be taken as prescribed. The medicine does not work as well when you skip doses. Skipping doses also puts you at risk for problems.  Do not smoke.   Monitor your blood pressure at home as directed by your health care provider. SEEK MEDICAL CARE IF:   You think you are having a reaction to medicines taken.  You have recurrent headaches or feel dizzy.  You have swelling in your ankles.  You have trouble with your vision. SEEK IMMEDIATE MEDICAL CARE IF:  You develop a severe headache or confusion.  You have unusual weakness, numbness, or feel faint.  You have severe chest or abdominal pain.  You vomit repeatedly.  You have trouble breathing. MAKE SURE YOU:   Understand these instructions.  Will watch your condition.  Will get help right away if you are not doing well or get worse.   This information is not intended to replace advice given to you by your health care provider. Make sure you discuss any questions you have with your health care provider.   Document Released: 01/15/2005 Document Revised: 06/01/2014 Document Reviewed: 11/07/2012 Elsevier Interactive Patient Education [employer].

## 2015-11-01 NOTE — Progress Notes (Signed)
Rebecca Long , July 06, 1950, 65 y.o., female MRN: [id number] Patient Care Team    Relationship Specialty Notifications Start End  Ma Hillock, DO PCP - General Family Medicine  10/06/14   Wallene Huh, DPM Consulting Physician Podiatry  10/18/15   Sydnee Levans, MD Consulting Physician Dermatology  10/18/15   Stefanie Libel, MD Consulting Physician Sports Medicine  11/01/15     CC: F/U chronic medical conditions.  Subjective:   Hypertension/hyperlipidemia: Pt reports compliance with losartan 100 mg QD and Lipitor 10 mg QD. Last lipid panel 03/2014 was well controlled on medications. Blood pressures have been stable. Pt denies chest pain, shortness of breath or lower extremity edema. She does take a daily baby ASA. She plays pickle ball a few times a week.   Health maintenance: Hep C and pneumovax due.   Allergies  Allergen Reactions  . Latex Rash  . Polysporin [Bacitracin-Polymyxin B] Rash   Social History  Substance Use Topics  . Smoking status: Former Smoker    Quit date: 01/30/1991  . Smokeless tobacco: Never Used  . Alcohol use No   Past Medical History:  Diagnosis Date  . Achilles tendinitis   . Anxiety   . Blood transfusion without reported diagnosis 1980   ectopic pregnancy  . Colon polyps    Benign, routine colonoscopy found  . Depression   . Hyperlipidemia   . Hypertension   . Insomnia   . Nerve pain    back  . Osteopenia   . Scoliosis   . Spinal stenosis    Past Surgical History:  Procedure Laterality Date  . BREAST REDUCTION SURGERY Bilateral 2000  . BREAST SURGERY    . CYST EXCISION  1985   head and rt axilla  . ECTOPIC PREGNANCY SURGERY  1981  . frozen shoulder Right 2007  . PARATHYROIDECTOMY  2008   Family History  Problem Relation Age of Onset  . Heart disease Father 48  . Cancer Mother   . Colon cancer Neg Hx      Medication List       Accurate as of 11/01/15 10:21 AM. Always use your most recent med list.          aspirin 81 MG  chewable tablet Chew 81 mg by mouth daily.   atorvastatin 10 MG tablet Commonly known as:  LIPITOR Take 1 tablet (10 mg total) by mouth daily.   calcium acetate 667 MG capsule Commonly known as:  PHOSLO Take by mouth as needed.   cholecalciferol 1000 units tablet Commonly known as:  VITAMIN D Take 1,000 Units by mouth daily.   FLUoxetine 40 MG capsule Commonly known as:  PROZAC Take 40 mg by mouth daily.   losartan 100 MG tablet Commonly known as:  COZAAR Take 1 tablet (100 mg total) by mouth daily.   multivitamin tablet Take 1 tablet by mouth daily.   zolpidem 5 MG tablet Commonly known as:  AMBIEN Take 5 mg by mouth at bedtime as needed.       No results found for this or any previous visit (from the past 24 hour(s)). No results found.   ROS: Negative, with the exception of above mentioned in HPI   Objective:  BP 136/88 (BP Location: Right Arm, Patient Position: Sitting, Cuff Size: Normal)   Pulse 62   Temp 97.7 F (36.5 C)   Resp 20   Ht 5\' 3"  (1.6 m)   Wt 165 lb 8 oz (75.1 kg)  SpO2 97%   BMI 29.32 kg/m  Body mass index is 29.32 kg/m. Gen: Afebrile. No acute distress. Nontoxic in appearance, well developed, well nourished. Pleasant caucasian female.  HENT: AT. [place]. MMM Eyes:Pupils Equal Round Reactive to light, Extraocular movements intact,  Conjunctiva without redness, discharge or icterus. Neck/lymp/endocrine: Supple,no  lymphadenopathy CV: RRR , no murmur, no edema Chest: CTAB, no wheeze or crackles. Good air movement, normal resp effort.  Abd: Soft. NTND. BS present. no Masses palpated.  Neuro: Normal gait. PERLA. EOMi. Alert. Oriented x3   Assessment/Plan: Rebecca Long is a 65 y.o. female present for acute OV for  Essential hypertension, benign/Hyperlipidemia, unspecified hyperlipidemia type - CBC, CMP, lipid panel future order to be completed fasting within a week.  - low salt, exercise counseled.  - refills on losartan and Lipitor today  for 6 mos.  - BMP yearly - F/U 6 months for HTN  Hep C screen: Hep C future  Need for 23-polyvalent pneumococcal polysaccharide vaccine - Pneumococcal polysaccharide vaccine 23-valent greater than or equal to 2yo subcutaneous/IM   electronically signed by:  Howard Pouch, DO  [place]

## 2015-11-10 ENCOUNTER — Other Ambulatory Visit (INDEPENDENT_AMBULATORY_CARE_PROVIDER_SITE_OTHER): Payer: Medicare Other

## 2015-11-10 DIAGNOSIS — I10 Essential (primary) hypertension: Secondary | ICD-10-CM | POA: Diagnosis not present

## 2015-11-10 DIAGNOSIS — E785 Hyperlipidemia, unspecified: Secondary | ICD-10-CM

## 2015-11-10 DIAGNOSIS — Z1159 Encounter for screening for other viral diseases: Secondary | ICD-10-CM | POA: Diagnosis not present

## 2015-11-10 LAB — BASIC METABOLIC PANEL WITH GFR
BUN: 13 mg/dL (ref 7–25)
CO2: 24 mmol/L (ref 20–31)
Calcium: 9.1 mg/dL (ref 8.6–10.4)
Chloride: 108 mmol/L (ref 98–110)
Creat: 0.72 mg/dL (ref 0.50–0.99)
GFR, Est African American: >89 mL/min (ref >=60)
GFR, Est Non African American: 88 mL/min (ref >=60)
Glucose, Bld: 84 mg/dL (ref 65–99)
Potassium: 4.6 mmol/L (ref 3.5–5.3)
Sodium: 142 mmol/L (ref 135–146)

## 2015-11-10 LAB — LIPID PANEL
Cholesterol: 159 mg/dL (ref 0–200)
HDL: 59.1 mg/dL (ref >39.00)
LDL Cholesterol: 80 mg/dL (ref 0–99)
NonHDL: 99.47
Total CHOL/HDL Ratio: 3
Triglycerides: 98 mg/dL (ref 0.0–149.0)
VLDL: 19.6 mg/dL (ref 0.0–40.0)

## 2015-11-10 LAB — CBC WITH DIFFERENTIAL/PLATELET
Basophils Absolute: 0 10*3/uL (ref 0.0–0.1)
Basophils Relative: 0.7 % (ref 0.0–3.0)
Eosinophils Absolute: 0.1 10*3/uL (ref 0.0–0.7)
Eosinophils Relative: 2.5 % (ref 0.0–5.0)
HCT: 41.8 % (ref 36.0–46.0)
Hemoglobin: 14 g/dL (ref 12.0–15.0)
Lymphocytes Relative: 39.2 % (ref 12.0–46.0)
Lymphs Abs: 2.1 10*3/uL (ref 0.7–4.0)
MCHC: 33.5 g/dL (ref 30.0–36.0)
MCV: 86.7 fl (ref 78.0–100.0)
Monocytes Absolute: 0.3 10*3/uL (ref 0.1–1.0)
Monocytes Relative: 6.4 % (ref 3.0–12.0)
Neutro Abs: 2.8 10*3/uL (ref 1.4–7.7)
Neutrophils Relative %: 51.2 % (ref 43.0–77.0)
Platelets: 277 10*3/uL (ref 150.0–400.0)
RBC: 4.83 Mil/uL (ref 3.87–5.11)
RDW: 14 % (ref 11.5–15.5)
WBC: 5.4 10*3/uL (ref 4.0–10.5)

## 2015-11-11 ENCOUNTER — Telehealth: Payer: Self-pay | Admitting: Family Medicine

## 2015-11-11 LAB — HEPATITIS C ANTIBODY: HCV Ab: NEGATIVE

## 2015-11-11 NOTE — Telephone Encounter (Signed)
Please call pt: - her labs are all normal.  

## 2015-11-11 NOTE — Telephone Encounter (Signed)
Spoke with patient reviewed lab results. 

## 2015-11-17 ENCOUNTER — Other Ambulatory Visit: Payer: Self-pay | Admitting: Family Medicine

## 2015-11-22 ENCOUNTER — Other Ambulatory Visit: Payer: Self-pay | Admitting: Family Medicine

## 2015-11-22 ENCOUNTER — Telehealth: Payer: Self-pay | Admitting: *Deleted

## 2015-11-22 DIAGNOSIS — E2839 Other primary ovarian failure: Secondary | ICD-10-CM

## 2015-11-22 DIAGNOSIS — M8580 Other specified disorders of bone density and structure, unspecified site: Secondary | ICD-10-CM

## 2015-11-22 NOTE — Telephone Encounter (Signed)
Order for bone density scan re entered previous order was expired.

## 2015-11-23 ENCOUNTER — Other Ambulatory Visit: Payer: Medicare Other

## 2015-11-23 ENCOUNTER — Ambulatory Visit
Admission: RE | Admit: 2015-11-23 | Discharge: 2015-11-23 | Disposition: A | Discharge status: Home / Self Care | Payer: Medicare Other | Source: Ambulatory Visit | Attending: Family Medicine | Admitting: Family Medicine

## 2015-11-23 ENCOUNTER — Telehealth: Payer: Self-pay | Admitting: Family Medicine

## 2015-11-23 DIAGNOSIS — M8580 Other specified disorders of bone density and structure, unspecified site: Secondary | ICD-10-CM

## 2015-11-23 DIAGNOSIS — Z780 Asymptomatic menopausal state: Secondary | ICD-10-CM | POA: Diagnosis not present

## 2015-11-23 DIAGNOSIS — M8589 Other specified disorders of bone density and structure, multiple sites: Secondary | ICD-10-CM | POA: Diagnosis not present

## 2015-11-23 DIAGNOSIS — E2839 Other primary ovarian failure: Secondary | ICD-10-CM

## 2015-11-23 NOTE — Telephone Encounter (Addendum)
Please call pt: - her bone density was positive for osteopenia. Her current fracture risk is not indicative of needing prescribed medication. She should continue her vit d/calcium supplement and we ill monitor her bone density every 3 years.

## 2015-11-23 NOTE — Telephone Encounter (Signed)
Spoke with patient reviewed results and instructions . Patient verbalized understanding. 

## 2016-05-01 DIAGNOSIS — Z1231 Encounter for screening mammogram for malignant neoplasm of breast: Secondary | ICD-10-CM | POA: Diagnosis not present

## 2016-05-01 LAB — HM MAMMOGRAPHY

## 2016-05-03 ENCOUNTER — Encounter: Payer: Self-pay | Admitting: *Deleted

## 2016-05-08 ENCOUNTER — Encounter: Payer: Self-pay | Admitting: Family Medicine

## 2016-05-08 ENCOUNTER — Ambulatory Visit (INDEPENDENT_AMBULATORY_CARE_PROVIDER_SITE_OTHER): Payer: Medicare Other | Admitting: Family Medicine

## 2016-05-08 VITALS — BP 107/71 | HR 70 | Temp 98.1°F | Resp 20 | Ht 63.0 in | Wt 166.0 lb

## 2016-05-08 DIAGNOSIS — E785 Hyperlipidemia, unspecified: Secondary | ICD-10-CM

## 2016-05-08 DIAGNOSIS — I10 Essential (primary) hypertension: Secondary | ICD-10-CM | POA: Diagnosis not present

## 2016-05-08 MED ORDER — LOSARTAN POTASSIUM 100 MG PO TABS: 100.0000 mg | ORAL_TABLET | Freq: Every day | ORAL | 1 refills | Status: DC

## 2016-05-08 NOTE — Progress Notes (Signed)
Rebecca Long , 04-13-1950, 66 y.o., female MRN: [id number] Patient Care Team    Relationship Specialty Notifications Start End  Ma Hillock, DO PCP - General Family Medicine  10/06/14   Wallene Huh, DPM Consulting Physician Podiatry  10/18/15   Sydnee Levans, MD Consulting Physician Dermatology  10/18/15   Stefanie Libel, MD Consulting Physician Sports Medicine  11/01/15     CC: F/U chronic medical conditions.  Subjective:   Hypertension/hyperlipidemia: She reports compliance with losartan 100 mg daily, Lipitor 10 mg daily, and daily baby aspirin. Patient's last BMP and lipids 11/10/2015 within normal limits. Patient denies any chest pain, shortness of breath, lower extremity edema. She continues to exercise, she enjoys pickle ball. She does admit that she is having difficulty losing weight, which she does love sugar.   Allergies  Allergen Reactions  . Latex Rash  . Polysporin [Bacitracin-Polymyxin B] Rash   Social History  Substance Use Topics  . Smoking status: Former Smoker    Quit date: 01/30/1991  . Smokeless tobacco: Never Used  . Alcohol use No   Past Medical History:  Diagnosis Date  . Achilles tendinitis   . Anxiety   . Blood transfusion without reported diagnosis 1980   ectopic pregnancy  . Colon polyps    Benign, routine colonoscopy found  . Depression   . Hyperlipidemia   . Hypertension   . Insomnia   . Nerve pain    back  . Osteopenia   . Scoliosis   . Spinal stenosis    Past Surgical History:  Procedure Laterality Date  . BREAST REDUCTION SURGERY Bilateral 2000  . BREAST SURGERY    . CYST EXCISION  1985   head and rt axilla  . ECTOPIC PREGNANCY SURGERY  1981  . frozen shoulder Right 2007  . PARATHYROIDECTOMY  2008   Family History  Problem Relation Age of Onset  . Heart disease Father 85  . Cancer Mother   . Colon cancer Neg Hx    Allergies as of 05/08/2016      Reactions   Latex Rash   Polysporin [bacitracin-polymyxin B] Rash        Medication List       Accurate as of 05/08/16 10:00 AM. Always use your most recent med list.          aspirin 81 MG chewable tablet Chew 81 mg by mouth daily.   atorvastatin 10 MG tablet Commonly known as:  LIPITOR Take 1 tablet (10 mg total) by mouth daily.   calcium acetate 667 MG capsule Commonly known as:  PHOSLO Take by mouth as needed.   cholecalciferol 1000 units tablet Commonly known as:  VITAMIN D Take 1,000 Units by mouth daily.   FLUoxetine 40 MG capsule Commonly known as:  PROZAC Take 40 mg by mouth daily.   losartan 100 MG tablet Commonly known as:  COZAAR Take 1 tablet (100 mg total) by mouth daily.   multivitamin tablet Take 1 tablet by mouth daily.   zolpidem 5 MG tablet Commonly known as:  AMBIEN Take 5 mg by mouth at bedtime as needed.       No results found for this or any previous visit (from the past 24 hour(s)). No results found.   ROS: Negative, with the exception of above mentioned in HPI   Objective:  BP 107/71 (BP Location: Left Arm, Patient Position: Sitting, Cuff Size: Large)   Pulse 70   Temp 98.1 F (36.7 C)  Resp 20   Ht 5\' 3"  (1.6 m)   Wt 166 lb (75.3 kg)   SpO2 97%   BMI 29.41 kg/m  Body mass index is 29.41 kg/m. Gen: Afebrile. No acute distress. Very pleasant Caucasian female. HENT: AT. [place].  MMM.  Eyes:Pupils Equal Round Reactive to light, Extraocular movements intact,  Conjunctiva without redness, discharge or icterus.no murmur CV: RRR no murmur, no edema, +2/4 P posterior tibialis pulses Chest: CTAB, no wheeze or crackles Abd: Soft. NTND. BS present.  Skin: Warm, well perfused, intact. Neuro: Normal gait. PERLA. EOMi. Alert. Oriented x3  Assessment/Plan: Rebecca Long is a 66 y.o. female present for acute OV for  Essential hypertension, benign/Hyperlipidemia, unspecified hyperlipidemia type -  blood pressure at goal today. - Continue exercising at least 150 minutes a week. Low-sodium diet. - Briefly  discussed weight loss today and if she could lose weight we could likely decrease her medication given her blood pressure today. She does love sugar, discussed cutting back on intake. - Continue losartan 100 mg daily, daily baby aspirin, and Lipitor 10 mg daily. Refills provided today on losartan. - Follow-up 6 months  electronically signed by:  Howard Pouch, DO  [place]

## 2016-05-08 NOTE — Patient Instructions (Signed)
Your blood pressure looks great. Keep up the good work. Follow up in 6 months, unless needed sooner

## 2016-05-28 ENCOUNTER — Encounter: Payer: Self-pay | Admitting: Family Medicine

## 2016-05-28 DIAGNOSIS — N6489 Other specified disorders of breast: Secondary | ICD-10-CM | POA: Diagnosis not present

## 2016-05-28 LAB — HM MAMMOGRAPHY

## 2016-05-29 ENCOUNTER — Encounter: Payer: Self-pay | Admitting: *Deleted

## 2016-07-02 ENCOUNTER — Ambulatory Visit (INDEPENDENT_AMBULATORY_CARE_PROVIDER_SITE_OTHER): Payer: Medicare Other | Admitting: Family Medicine

## 2016-07-02 ENCOUNTER — Encounter: Payer: Self-pay | Admitting: Family Medicine

## 2016-07-02 VITALS — BP 122/64 | HR 63 | Temp 97.8°F | Resp 16 | Wt 164.0 lb

## 2016-07-02 DIAGNOSIS — M67921 Unspecified disorder of synovium and tendon, right upper arm: Secondary | ICD-10-CM | POA: Diagnosis not present

## 2016-07-02 DIAGNOSIS — M76891 Other specified enthesopathies of right lower limb, excluding foot: Secondary | ICD-10-CM

## 2016-07-02 HISTORY — DX: Unspecified disorder of synovium and tendon, right upper arm: M67.921

## 2016-07-02 HISTORY — DX: Other specified enthesopathies of right lower limb, excluding foot: M76.891

## 2016-07-02 MED ORDER — NAPROXEN 500 MG PO TABS: 500.0000 mg | ORAL_TABLET | Freq: Two times a day (BID) | ORAL | 0 refills | Status: DC

## 2016-07-02 MED ORDER — MELOXICAM 7.5 MG PO TABS: 7.5000 mg | ORAL_TABLET | Freq: Every day | ORAL | 11 refills | Status: DC

## 2016-07-02 NOTE — Patient Instructions (Addendum)
Naproxen every 12 hours for 5-7 days.  You can then try mobic daily after naproxen stops. Dont use mobic with other anti- inflammatory.  Rest. Take 2 weeks off, then ease back into activities. Stretch before and after activities.     Hip Exercises Ask your health care provider which exercises are safe for you. Do exercises exactly as told by your health care provider and adjust them as directed. It is normal to feel mild stretching, pulling, tightness, or discomfort as you do these exercises, but you should stop right away if you feel sudden pain or your pain gets worse.Do not begin these exercises until told by your health care provider. STRETCHING AND RANGE OF MOTION EXERCISES These exercises warm up your muscles and joints and improve the movement and flexibility of your hip. These exercises also help to relieve pain, numbness, and tingling. Exercise A: Hamstrings, Supine  1. Lie on your back. 2. Loop a belt or towel over the ball of your left / rightfoot. The ball of your foot is on the walking surface, right under your toes. 3. Straighten your left / rightknee and slowly pull on the belt to raise your leg. ? Do not let your left / right knee bend while you do this. ? Keep your other leg flat on the floor. ? Raise the left / right leg until you feel a gentle stretch behind your left / right knee or thigh. 4. Hold this position for __________ seconds. 5. Slowly return your leg to the starting position. Repeat __________ times. Complete this stretch __________ times a day. Exercise B: Hip Rotators  1. Lie on your back on a firm surface. 2. Hold your left / right knee with your left / right hand. Hold your ankle with your other hand. 3. Gently pull your left / right knee and rotate your lower leg toward your other shoulder. ? Pull until you feel a stretch in your buttocks. ? Keep your hips and shoulders firmly planted while you do this stretch. 4. Hold this position for __________  seconds. Repeat __________ times. Complete this stretch __________ times a day. Exercise C: V-Sit (Hamstrings and Adductors)  1. Sit on the floor with your legs extended in a large "V" shape. Keep your knees straight during this exercise. 2. Start with your head and chest upright, then bend at your waist to reach for your left foot (position A). You should feel a stretch in your right inner thigh. 3. Hold this position for __________ seconds. Then slowly return to the upright position. 4. Bend at your waist to reach forward (position B). You should feel a stretch behind both of your thighs and knees. 5. Hold this position for __________ seconds. Then slowly return to the upright position. 6. Bend at your waist to reach for your right foot (position C). You should feel a stretch in your left inner thigh. 7. Hold this position for __________ seconds. Then slowly return to the upright position. Repeat __________ times. Complete this stretch __________ times a day. Exercise D: Lunge (Hip Flexors)  1. Place your left / right knee on the floor and bend your other knee so that is directly over your ankle. You should be half-kneeling. 2. Keep good posture with your head over your shoulders. 3. Tighten your buttocks to point your tailbone downward. This helps your back to keep from arching too much. 4. You should feel a gentle stretch in the front of your left / right thigh and hip. If  you do not feel any resistance, slightly slide your other foot forward and then slowly lunge forward so your knee once again lines up over your ankle. 5. Make sure your tailbone continues to point downward. 6. Hold this position for __________ seconds. Repeat __________ times. Complete this stretch __________ times a day. STRENGTHENING EXERCISES These exercises build strength and endurance in your hip. Endurance is the ability to use your muscles for a long time, even after they get tired. Exercise E: Bridge (Hip  Extensors)  1. Lie on your back on a firm surface with your knees bent and your feet flat on the floor. 2. Tighten your buttocks muscles and lift your bottom off the floor until the trunk of your body is level with your thighs. ? Do not arch your back. ? You should feel the muscles working in your buttocks and the back of your thighs. If you do not feel these muscles, slide your feet 1-2 inches (2.5-5 cm) farther away from your buttocks. 3. Hold this position for __________ seconds. 4. Slowly lower your hips to the starting position. 5. Let your muscles relax completely between repetitions. 6. If this exercise is too easy, try doing it with your arms crossed over your chest. Repeat __________ times. Complete this exercise __________ times a day. Exercise F: Straight Leg Raises - Hip Abductors  1. Lie on your side with your left / right leg in the top position. Lie so your head, shoulder, knee, and hip line up with each other. You may bend your bottom knee to help you balance. 2. Roll your hips slightly forward, so your hips are stacked directly over each other and your left / right knee is facing forward. 3. Leading with your heel, lift your top leg 4-6 inches (10-15 cm). You should feel the muscles in your outer hip lifting. ? Do not let your foot drift forward. ? Do not let your knee roll toward the ceiling. 4. Hold this position for __________ seconds. 5. Slowly return to the starting position. 6. Let your muscles relax completely between repetitions. Repeat __________ times. Complete this exercise __________ times a day. Exercise G: Straight Leg Raises - Hip Adductors  1. Lie on your side with your left / right leg in the bottom position. Lie so your head, shoulder, knee, and hip line up. You may place your upper foot in front to help you balance. 2. Roll your hips slightly forward, so your hips are stacked directly over each other and your left / right knee is facing forward. 3. Tense  the muscles in your inner thigh and lift your bottom leg 4-6 inches (10-15 cm). 4. Hold this position for __________ seconds. 5. Slowly return to the starting position. 6. Let your muscles relax completely between repetitions. Repeat __________ times. Complete this exercise __________ times a day. Exercise H: Straight Leg Raises - Quadriceps  1. Lie on your back with your left / right leg extended and your other knee bent. 2. Tense the muscles in the front of your left / right thigh. When you do this, you should see your kneecap slide up or see increased dimpling just above your knee. 3. Tighten these muscles even more and raise your leg 4-6 inches (10-15 cm) off the floor. 4. Hold this position for __________ seconds. 5. Keep these muscles tense as you lower your leg. 6. Relax the muscles slowly and completely between repetitions. Repeat __________ times. Complete this exercise __________ times a day. Exercise I: Hip  Abductors, Standing 1. Tie one end of a rubber exercise band or tubing to a secure surface, such as a table or pole. 2. Loop the other end of the band or tubing around your left / right ankle. 3. Keeping your ankle with the band or tubing directly opposite of the secured end, step away until there is tension in the tubing or band. Hold onto a chair as needed for balance. 4. Lift your left / right leg out to your side. While you do this: ? Keep your back upright. ? Keep your shoulders over your hips. ? Keep your toes pointing forward. ? Make sure to use your hip muscles to lift your leg. Do not "throw" your leg or tip your body to lift your leg. 5. Hold this position for __________ seconds. 6. Slowly return to the starting position. Repeat __________ times. Complete this exercise __________ times a day. Exercise J: Squats (Quadriceps) 1. Stand in a door frame so your feet and knees are in line with the frame. You may place your hands on the frame for balance. 2. Slowly bend  your knees and lower your hips like you are going to sit in a chair. ? Keep your lower legs in a straight-up-and-down position. ? Do not let your hips go lower than your knees. ? Do not bend your knees lower than told by your health care provider. ? If your hip pain increases, do not bend as low. 3. Hold this position for ___________ seconds. 4. Slowly push with your legs to return to standing. Do not use your hands to pull yourself to standing. Repeat __________ times. Complete this exercise __________ times a day. This information is not intended to replace advice given to you by your health care provider. Make sure you discuss any questions you have with your health care provider. Document Released: 02/02/2005 Document Revised: 10/10/2015 Document Reviewed: 01/10/2015 Elsevier Interactive Patient Education  [employer].

## 2016-07-02 NOTE — Progress Notes (Signed)
Rebecca Long , [birth year], 66 y.o., female MRN: [id number] Patient Care Team    Relationship Specialty Notifications Start End  Ma Hillock, DO PCP - General Family Medicine  10/06/14   Wallene Huh, [place] Consulting Physician Podiatry  10/18/15   Sydnee Levans, MD Consulting Physician Dermatology  10/18/15   Stefanie Libel, MD Consulting Physician Sports Medicine  11/01/15     Chief Complaint  Patient presents with  . Groin Pain    right side groin pain, right shoulder pain     Subjective: Pt presents for an OV with complaints of 2 msk complaints.   Shoulder pain: prior h/o right shoulder surgery for fx, then adhesive capsulitis, which needed second surgery to improve. She denies known injury, but reports it has been achy for a few weeks and points to bicep area. She use to take mobic for her shoulder and it worked great. She has not tried anything OTC.   Groin pain:  Pt reports right groin pain, that has been "coming on for a few weeks", but started hurting worse for 3 days. She plays pickle ball and it started after playing Friday. She reports seating straight up makes worse and flexing hip. She states the pain is intermittent and no tenderness to touch. She did take a week of activitie s   Depression screen [facility] 2/9 10/18/2015  Decreased Interest 0  Down, Depressed, Hopeless 0  PHQ - 2 Score 0    Allergies  Allergen Reactions  . Latex Rash  . Polysporin [Bacitracin-Polymyxin B] Rash   Social History  Substance Use Topics  . Smoking status: Former Smoker    Quit date: 01/30/1991  . Smokeless tobacco: Never Used  . Alcohol use No   Past Medical History:  Diagnosis Date  . Achilles tendinitis   . Anxiety   . Blood transfusion without reported diagnosis 1980   ectopic pregnancy  . Colon polyps    Benign, routine colonoscopy found  . Depression   . Hyperlipidemia   . Hypertension   . Insomnia   . Nerve pain    back  . Osteopenia   . Scoliosis   . Spinal  stenosis    Past Surgical History:  Procedure Laterality Date  . BREAST REDUCTION SURGERY Bilateral 2000  . BREAST SURGERY    . CYST EXCISION  1985   head and rt axilla  . ECTOPIC PREGNANCY SURGERY  1981  . frozen shoulder Right 2007  . PARATHYROIDECTOMY  2008   Family History  Problem Relation Age of Onset  . Heart disease Father 74  . Cancer Mother   . Colon cancer Neg Hx    Allergies as of 07/02/2016      Reactions   Latex Rash   Polysporin [bacitracin-polymyxin B] Rash      Medication List       Accurate as of 07/02/16  1:56 PM. Always use your most recent med list.          aspirin 81 MG chewable tablet Chew 81 mg by mouth daily.   atorvastatin 10 MG tablet Commonly known as:  LIPITOR Take 1 tablet (10 mg total) by mouth daily.   calcium acetate 667 MG capsule Commonly known as:  PHOSLO Take by mouth as needed.   cholecalciferol 1000 units tablet Commonly known as:  VITAMIN D Take 1,000 Units by mouth daily.   FLUoxetine 40 MG capsule Commonly known as:  PROZAC Take 40 mg by mouth daily.  losartan 100 MG tablet Commonly known as:  COZAAR Take 1 tablet (100 mg total) by mouth daily.   multivitamin tablet Take 1 tablet by mouth daily.   zolpidem 5 MG tablet Commonly known as:  AMBIEN Take 5 mg by mouth at bedtime as needed.       All past medical history, surgical history, allergies, family history, immunizations andmedications were updated in the EMR today and reviewed under the history and medication portions of their EMR.     ROS: Negative, with the exception of above mentioned in HPI   Objective:  BP 122/64 (BP Location: Right Arm, Patient Position: Sitting, Cuff Size: Normal)   Pulse 63   Temp 97.8 F (36.6 C) (Oral)   Resp 16   Wt 164 lb (74.4 kg)   SpO2 98%   BMI 29.05 kg/m  Body mass index is 29.05 kg/m. Gen: Afebrile. No acute distress. Nontoxic in appearance, well developed, well nourished.  HENT: AT. [place].  MMM, no oral  lesions.  Eyes:Pupils Equal Round Reactive to light, Extraocular movements intact,  Conjunctiva without redness, discharge or icterus. MSK: no erythema, no soft tissue swelling either locations.   right shoulder: TTP right bicep Full ROM. Neg empty can, neg hawkins. 5/5 UE MS strength. NV intact distally.    Right LE: full ROM. No ttp. Bilateral LE 5/5 MS with mild discomfort with resisted flexion of hip. Neg FABRE and SLR bilaterally. NV intact distally.  Neuro:  Normal gait. PERLA. EOMi. Alert. Oriented x3  No exam data present No results found. No results found for this or any previous visit (from the past 24 hour(s)).  Assessment/Plan: Rebecca Long is a 66 y.o. female present for OV for  Hip flexor tendinitis, right Tendinopathy of right biceps tendon (proximal origin) - NSAIDS, rest, stretches to start next week. Than ease back into activity, making sure to warm up with stretches and cool down with stretches.  - stretches provided to her today.  - encouraged her to decrease activity for 2 weeks.  - naproxen BID for 5 days, then can start mobic.  Both prescribed today with instruction.   Reviewed expectations re: course of current medical issues.  Discussed self-management of symptoms.  Outlined signs and symptoms indicating need for more acute intervention.  Patient verbalized understanding and all questions were answered.  Patient received an After-Visit Summary.     Note is dictated utilizing voice recognition software. Although note has been proof read prior to signing, occasional typographical errors still can be missed. If any questions arise, please do not hesitate to call for verification.   electronically signed by:  Howard Pouch, DO  [place]

## 2016-10-25 DIAGNOSIS — F3341 Major depressive disorder, recurrent, in partial remission: Secondary | ICD-10-CM | POA: Diagnosis not present

## 2016-10-25 DIAGNOSIS — F419 Anxiety disorder, unspecified: Secondary | ICD-10-CM | POA: Diagnosis not present

## 2016-11-12 ENCOUNTER — Encounter: Payer: Self-pay | Admitting: Family Medicine

## 2016-11-12 ENCOUNTER — Ambulatory Visit (INDEPENDENT_AMBULATORY_CARE_PROVIDER_SITE_OTHER): Payer: Medicare Other | Admitting: Family Medicine

## 2016-11-12 VITALS — BP 136/86 | HR 62 | Temp 98.2°F | Resp 20 | Ht 63.0 in | Wt 164.0 lb

## 2016-11-12 DIAGNOSIS — Z6829 Body mass index (BMI) 29.0-29.9, adult: Secondary | ICD-10-CM | POA: Diagnosis not present

## 2016-11-12 DIAGNOSIS — Z8619 Personal history of other infectious and parasitic diseases: Secondary | ICD-10-CM | POA: Insufficient documentation

## 2016-11-12 DIAGNOSIS — F418 Other specified anxiety disorders: Secondary | ICD-10-CM

## 2016-11-12 DIAGNOSIS — I10 Essential (primary) hypertension: Secondary | ICD-10-CM

## 2016-11-12 DIAGNOSIS — Z23 Encounter for immunization: Secondary | ICD-10-CM | POA: Diagnosis not present

## 2016-11-12 DIAGNOSIS — Z1389 Encounter for screening for other disorder: Secondary | ICD-10-CM | POA: Insufficient documentation

## 2016-11-12 DIAGNOSIS — E663 Overweight: Secondary | ICD-10-CM

## 2016-11-12 HISTORY — DX: Personal history of other infectious and parasitic diseases: Z86.19

## 2016-11-12 MED ORDER — LOSARTAN POTASSIUM 100 MG PO TABS: 100.0000 mg | ORAL_TABLET | Freq: Every day | ORAL | 1 refills | Status: DC

## 2016-11-12 MED ORDER — VALACYCLOVIR HCL 1 G PO TABS: ORAL_TABLET | ORAL | 0 refills | Status: DC

## 2016-11-12 NOTE — Patient Instructions (Addendum)
Check BP in a week or so and make sure below 135/85.  Low sodium.  I refilled you medications.  Follow up in 6 months.    Please help [place] help you:  We are honored you have chosen [facility] for your Primary Care home. Below you will find basic instructions that you may need to access in the future. Please help [place] help you by reading the instructions, which cover many of the frequent questions we experience.   Prescription refills and request:  -In order to allow more efficient response time, please call your pharmacy for all refills. They will forward the request electronically to [place]. This allows for the quickest possible response. Request left on a nurse line can take longer to refill, since these are checked as time allows between office patients and other phone calls.  - refill request can take up to 3-5 working days to complete.  - If request is sent electronically and request is appropiate, it is usually completed in 1-2 business days.  - all patients will need to be seen routinely for all chronic medical conditions requiring prescription medications (see follow-up below). If you are overdue for follow up on your condition, you will be asked to make an appointment and we will call in enough medication to cover you until your appointment (up to 30 days).  - all controlled substances will require a face to face visit to request/refill.  - if you desire your prescriptions to go through a new pharmacy, and have an active script at original pharmacy, you will need to call your pharmacy and have scripts transferred to new pharmacy. This is completed between the pharmacy locations and not by your provider.    Results: If any images or labs were ordered, it can take up to 1 week to get results depending on the test ordered and the lab/facility running and resulting the test. - Normal or stable results, which do not need further discussion, may be released to your mychart immediately with  attached note to you. A call may not be generated for normal results. Please make certain to sign up for mychart. If you have questions on how to activate your mychart you can call the front office.  - If your results need further discussion, our office will attempt to contact you via phone, and if unable to reach you after 2 attempts, we will release your abnormal result to your mychart with instructions.  - All results will be automatically released in mychart after 1 week.  - Your provider will provide you with explanation and instruction on all relevant material in your results. Please keep in mind, results and labs may appear confusing or abnormal to the untrained eye, but it does not mean they are actually abnormal for you personally. If you have any questions about your results that are not covered, or you desire more detailed explanation than what was provided, you should make an appointment with your provider to do so.   Our office handles many outgoing and incoming calls daily. If we have not contacted you within 1 week about your results, please check your mychart to see if there is a message first and if not, then contact our office.  In helping with this matter, you help decrease call volume, and therefore allow [place] to be able to respond to patients needs more efficiently.   Acute office visits (sick visit):  An acute visit is intended for a new problem and are scheduled  in shorter time slots to allow schedule openings for patients with new problems. This is the appropriate visit to discuss a new problem. In order to provide you with excellent quality medical care with proper time for you to explain your problem, have an exam and receive treatment with instructions, these appointments should be limited to one new problem per visit. If you experience a new problem, in which you desire to be addressed, please make an acute office visit, we save openings on the schedule to accommodate you. Please do  not save your new problem for any other type of visit, let us take care of it properly and quickly for you.   Follow up visits:  Depending on your condition(s) your provider will need to see you routinely in order to provide you with quality care and prescribe medication(s). Most chronic conditions (Example: hypertension, Diabetes, depression/anxiety... etc), require visits a couple times a year. Your provider will instruct you on proper follow up for your personal medical conditions and history. Please make certain to make follow up appointments for your condition as instructed. Failing to do so could result in lapse in your medication treatment/refills. If you request a refill, and are overdue to be seen on a condition, we will always provide you with a 30 day script (once) to allow you time to schedule.    Medicare wellness (well visit): - we have a wonderful Nurse Maudie Mercury), that will meet with you and provide you will yearly medicare wellness visits. These visits should occur yearly (can not be scheduled less than 1 calendar year apart) and cover preventive health, immunizations, advance directives and screenings you are entitled to yearly through your medicare benefits. Do not miss out on your entitled benefits, this is when medicare will pay for these benefits to be ordered for you.  These are strongly encouraged by your provider and is the appropriate type of visit to make certain you are up to date with all preventive health benefits. If you have not had your medicare wellness exam in the last 12 months, please make certain to schedule one by calling the office and schedule your medicare wellness with Maudie Mercury as soon as possible.   Yearly physical (well visit):  - Adults are recommended to be seen yearly for physicals. Check with your insurance and date of your last physical, most insurances require one calendar year between physicals. Physicals include all preventive health topics, screenings, medical  exam and labs that are appropriate for gender/age and history. You may have fasting labs needed at this visit. This is a well visit (not a sick visit), new problems should not be covered during this visit (see acute visit).  - Pediatric patients are seen more frequently when they are younger. Your provider will advise you on well child visit timing that is appropriate for your their age. - This is not a medicare wellness visit. Medicare wellness exams do not have an exam portion to the visit. Some medicare companies allow for a physical, some do not allow a yearly physical. If your medicare allows a yearly physical you can schedule the medicare wellness with our nurse Maudie Mercury and have your physical with your provider after, on the same day. Please check with insurance for your full benefits.   Late Policy/No Shows:  - all new patients should arrive 15-30 minutes earlier than appointment to allow [place] time  to  obtain all personal demographics,  insurance information and for you to complete office paperwork. - All established  patients should arrive 10-15 minutes earlier than appointment time to update all information and be checked in .  - In our best efforts to run on time, if you are late for your appointment you will be asked to either reschedule or if able, we will work you back into the schedule. There will be a wait time to work you back in the schedule,  depending on availability.  - If you are unable to make it to your appointment as scheduled, please call 24 hours ahead of time to allow [place] to fill the time slot with someone else who needs to be seen. If you do not cancel your appointment ahead of time, you may be charged a no show fee.

## 2016-11-12 NOTE — Progress Notes (Signed)
Rebecca Long , 08-Dec-1950, 66 y.o., female MRN: [id number] Patient Care Team    Relationship Specialty Notifications Start End  Rebecca Hillock, DO PCP - General Family Medicine  10/06/14   Rebecca Long, [place] Consulting Physician Podiatry  10/18/15   Rebecca Levans, MD Consulting Physician Dermatology  10/18/15   Rebecca Libel, MD Consulting Physician Sports Medicine  11/01/15     Chief Complaint  Patient presents with  . Hypertension    Subjective:   Hypertension/hyperlipidemia:   Pt reports compliance with Losartan 100 mg daily, Lipitor 10 mg daily and daily baby aspirin. Blood pressures ranges at home not routinely checked. Patient denies chest pain, shortness of breath or lower extremity edema. Pt endorses use of daily baby ASA. Pt is  prescribed statin. BMP: 11/10/2015 WNL CBC: 11/10/2015 WNL Lipid: 11/10/2015 WNL Exercise: exercise and pickle ball.  RF: HTN, HLD, Fhx Heart disease,  Depression/anxiety: Followed by Rebecca Long, prescribed Ambien and prozac.   HSV: Patient reports using Valtrex 1000 mg daily for 3-5 days with onset of genital lesion. She does need refills on these medications today.   Depression screen [facility] 2/9 11/12/2016 11/12/2016 10/18/2015  Decreased Interest 0 0 0  Down, Depressed, Hopeless 0 0 0  PHQ - 2 Score 0 0 0  Altered sleeping 0 - -  Tired, decreased energy 3 - -  Change in appetite 0 - -  Feeling bad or failure about yourself  0 - -  Trouble concentrating 2 - -  Moving slowly or fidgety/restless 0 - -  Suicidal thoughts 0 - -  PHQ-9 Score 5 - -    Allergies  Allergen Reactions  . Latex Rash  . Polysporin [Bacitracin-Polymyxin B] Rash   Social History  Substance Use Topics  . Smoking status: Former Smoker    Quit date: 01/30/1991  . Smokeless tobacco: Never Used  . Alcohol use No   Past Medical History:  Diagnosis Date  . Achilles tendinitis   . Anxiety   . Blood transfusion without reported diagnosis 1980   ectopic  pregnancy  . Colon polyps    Benign, routine colonoscopy found  . Depression   . Hyperlipidemia   . Hypertension   . Insomnia   . Nerve pain    back  . Osteopenia   . Scoliosis   . Spinal stenosis    Past Surgical History:  Procedure Laterality Date  . BREAST REDUCTION SURGERY Bilateral 2000  . BREAST SURGERY    . CYST EXCISION  1985   head and rt axilla  . ECTOPIC PREGNANCY SURGERY  1981  . frozen shoulder Right 2007  . PARATHYROIDECTOMY  2008   Family History  Problem Relation Age of Onset  . Heart disease Father 30  . Cancer Mother   . Colon cancer Neg Hx    Allergies as of 11/12/2016      Reactions   Latex Rash   Polysporin [bacitracin-polymyxin B] Rash      Medication List       Accurate as of 11/12/16 10:19 AM. Always use your most recent med list.          atorvastatin 10 MG tablet Commonly known as:  LIPITOR Take 1 tablet (10 mg total) by mouth daily.   cholecalciferol 1000 units tablet Commonly known as:  VITAMIN D Take 1,000 Units by mouth daily.   FLUoxetine 40 MG capsule Commonly known as:  PROZAC Take 40 mg by mouth daily.   losartan 100  MG tablet Commonly known as:  COZAAR Take 1 tablet (100 mg total) by mouth daily.   meloxicam 7.5 MG tablet Commonly known as:  MOBIC Take 1 tablet (7.5 mg total) by mouth daily.   multivitamin tablet Take 1 tablet by mouth daily.   valACYclovir 1000 MG tablet Commonly known as:  VALTREX   zolpidem 5 MG tablet Commonly known as:  AMBIEN Take 5 mg by mouth at bedtime as needed.       No results found for this or any previous visit (from the past 24 hour(s)). No results found.   ROS: Negative, with the exception of above mentioned in HPI   Objective:  BP 136/86 (BP Location: Right Arm, Patient Position: Sitting, Cuff Size: Normal)   Pulse 62   Temp 98.2 F (36.8 C)   Resp 20   Ht 5\' 3"  (1.6 m)   Wt 164 lb (74.4 kg)   SpO2 97%   BMI 29.05 kg/m  Body mass index is 29.05  kg/m. Gen: Afebrile. No acute distress. Nontoxic in appearance, well-developed, well-nourished, Caucasian female. HENT: AT. [place].MMM. Eyes:Pupils Equal Round Reactive to light, Extraocular movements intact,  Conjunctiva without redness, discharge or icterus. Neck/lymp/endocrine: Supple, no lymphadenopathy, no thyromegaly CV: RRR no murmur, no edema, +2/4 P posterior tibialis pulses Chest: CTAB, no wheeze or crackles Abd: Soft. NTND. BS present. No Masses palpated.  Neuro:  Normal gait. PERLA. EOMi. Alert. Oriented x3 Psych: Normal affect, dress and demeanor. Normal speech. Normal thought content and judgment.  Assessment/Plan: Rebecca Long is a 66 y.o. female present for acute OV for  Essential hypertension, benign/Hyperlipidemia, unspecified hyperlipidemia type/BMI 29 -  stable, but borderline for her. She was encouraged to monitor in the outpatient setting if above 135/85 routinely would want to see her back sooner to discuss the addition of medication. - Continue exercising at least 150 minutes a week. Low-sodium diet. - Continue losartan 100 mg daily, daily baby aspirin, and Lipitor 10 mg daily. Refills provided today on losartan. - Follow-up 6 months Depression with anxiety: Patient follows with psychiatry. PHQ9 completed today. HSV2: Refills on Valtrex provided today.  Influenza vaccination administered today.  electronically signed by:  Rebecca Pouch, DO  [place]

## 2016-11-13 DIAGNOSIS — D2262 Melanocytic nevi of left upper limb, including shoulder: Secondary | ICD-10-CM | POA: Diagnosis not present

## 2016-11-13 DIAGNOSIS — L218 Other seborrheic dermatitis: Secondary | ICD-10-CM | POA: Diagnosis not present

## 2016-11-13 DIAGNOSIS — D225 Melanocytic nevi of trunk: Secondary | ICD-10-CM | POA: Diagnosis not present

## 2016-11-13 DIAGNOSIS — L821 Other seborrheic keratosis: Secondary | ICD-10-CM | POA: Diagnosis not present

## 2016-11-13 DIAGNOSIS — Z85828 Personal history of other malignant neoplasm of skin: Secondary | ICD-10-CM | POA: Diagnosis not present

## 2016-11-30 ENCOUNTER — Ambulatory Visit (INDEPENDENT_AMBULATORY_CARE_PROVIDER_SITE_OTHER): Payer: Medicare Other | Admitting: Internal Medicine

## 2016-11-30 DIAGNOSIS — Z9189 Other specified personal risk factors, not elsewhere classified: Secondary | ICD-10-CM | POA: Diagnosis not present

## 2016-11-30 DIAGNOSIS — Z23 Encounter for immunization: Secondary | ICD-10-CM | POA: Diagnosis not present

## 2016-11-30 DIAGNOSIS — Z789 Other specified health status: Secondary | ICD-10-CM

## 2016-11-30 DIAGNOSIS — Z7184 Encounter for health counseling related to travel: Secondary | ICD-10-CM

## 2016-11-30 DIAGNOSIS — Z7189 Other specified counseling: Secondary | ICD-10-CM

## 2016-11-30 MED ORDER — AZITHROMYCIN 500 MG PO TABS: 500.0000 mg | ORAL_TABLET | Freq: Every day | ORAL | 0 refills | Status: DC

## 2016-11-30 NOTE — Progress Notes (Signed)
Subjective:   Rebecca Long is a 66 y.o. female who presents to the Infectious Disease clinic for travel consultation. Planned departure date: November thanksgiving Planned return date: 4 days Countries of travel: [place] Areas in country: rural?  Accommodations: resort Purpose of travel: family vacation Prior travel out of [place]: yes- [place], [place], [place], [place], [place], [place], [place], [place]  Vaccines - flu and pneumonococcal     Objective:   Medications: atorvastatin, losratan, prozac, ambien, and vitamin d All _ none   Assessment:   No contraindications to travel. none   Plan:   Mosquito borne illness = give precautions to use mosquito repellant  Pre travel vaccine = gave option to get hepatitis A to prevent food borne illness/hepatitis  Traveler's diarrrhea = gave rx for azithromycin to use if needed. Gave recs to bring pepto and immodium

## 2016-11-30 NOTE — Addendum Note (Signed)
Addended by: Aundria Rud on: 11/30/2016 11:00 AM   Modules accepted: Orders

## 2016-12-17 ENCOUNTER — Other Ambulatory Visit: Payer: Self-pay | Admitting: Family Medicine

## 2017-03-31 DIAGNOSIS — R509 Fever, unspecified: Secondary | ICD-10-CM | POA: Diagnosis not present

## 2017-03-31 DIAGNOSIS — J019 Acute sinusitis, unspecified: Secondary | ICD-10-CM | POA: Diagnosis not present

## 2017-04-09 ENCOUNTER — Other Ambulatory Visit: Payer: Self-pay

## 2017-04-09 ENCOUNTER — Ambulatory Visit (INDEPENDENT_AMBULATORY_CARE_PROVIDER_SITE_OTHER): Payer: Medicare HMO

## 2017-04-09 VITALS — BP 120/76 | HR 70 | Ht 63.0 in | Wt 165.8 lb

## 2017-04-09 DIAGNOSIS — Z0000 Encounter for general adult medical examination without abnormal findings: Secondary | ICD-10-CM

## 2017-04-09 DIAGNOSIS — Z23 Encounter for immunization: Secondary | ICD-10-CM

## 2017-04-09 MED ORDER — ZOSTER VAC RECOMB ADJUVANTED 50 MCG/0.5ML IM SUSR: 0.5000 mL | Freq: Once | INTRAMUSCULAR | 1 refills | Status: AC

## 2017-04-09 NOTE — Patient Instructions (Addendum)
Schedule mammogram in April.   Schedule eye exam.   Continue doing brain stimulating activities (puzzles, reading, adult coloring books, staying active) to keep memory sharp.   Bring a copy of your living will and/or healthcare power of attorney to your next office visit.  Health Maintenance, Female Adopting a healthy lifestyle and getting preventive care can go a long way to promote health and wellness. Talk with your health care provider about what schedule of regular examinations is right for you. This is a good chance for you to check in with your provider about disease prevention and staying healthy. In between checkups, there are plenty of things you can do on your own. Experts have done a lot of research about which lifestyle changes and preventive measures are most likely to keep you healthy. Ask your health care provider for more information. Weight and diet Eat a healthy diet  Be sure to include plenty of vegetables, fruits, low-fat dairy products, and lean protein.  Do not eat a lot of foods high in solid fats, added sugars, or salt.  Get regular exercise. This is one of the most important things you can do for your health. ? Most adults should exercise for at least 150 minutes each week. The exercise should increase your heart rate and make you sweat (moderate-intensity exercise). ? Most adults should also do strengthening exercises at least twice a week. This is in addition to the moderate-intensity exercise.  Maintain a healthy weight  Body mass index (BMI) is a measurement that can be used to identify possible weight problems. It estimates body fat based on height and weight. Your health care provider can help determine your BMI and help you achieve or maintain a healthy weight.  For females 49 years of age and older: ? A BMI below 18.5 is considered underweight. ? A BMI of 18.5 to 24.9 is normal. ? A BMI of 25 to 29.9 is considered overweight. ? A BMI of 30 and above is  considered obese.  Watch levels of cholesterol and blood lipids  You should start having your blood tested for lipids and cholesterol at 68 years of age, then have this test every 5 years.  You may need to have your cholesterol levels checked more often if: ? Your lipid or cholesterol levels are high. ? You are older than 68 years of age. ? You are at high risk for heart disease.  Cancer screening Lung Cancer  Lung cancer screening is recommended for adults 63-57 years old who are at high risk for lung cancer because of a history of smoking.  A yearly low-dose CT scan of the lungs is recommended for people who: ? Currently smoke. ? Have quit within the past 15 years. ? Have at least a 30-pack-year history of smoking. A pack year is smoking an average of one pack of cigarettes a day for 1 year.  Yearly screening should continue until it has been 15 years since you quit.  Yearly screening should stop if you develop a health problem that would prevent you from having lung cancer treatment.  Breast Cancer  Practice breast self-awareness. This means understanding how your breasts normally appear and feel.  It also means doing regular breast self-exams. Let your health care provider know about any changes, no matter how small.  If you are in your 20s or 30s, you should have a clinical breast exam (CBE) by a health care provider every 1-3 years as part of a regular health exam.  If you are 40 or older, have a CBE every year. Also consider having a breast X-ray (mammogram) every year.  If you have a family history of breast cancer, talk to your health care provider about genetic screening.  If you are at high risk for breast cancer, talk to your health care provider about having an MRI and a mammogram every year.  Breast cancer gene (BRCA) assessment is recommended for women who have family members with BRCA-related cancers. BRCA-related cancers  include: ? Breast. ? Ovarian. ? Tubal. ? Peritoneal cancers.  Results of the assessment will determine the need for genetic counseling and BRCA1 and BRCA2 testing.  Cervical Cancer Your health care provider may recommend that you be screened regularly for cancer of the pelvic organs (ovaries, uterus, and vagina). This screening involves a pelvic examination, including checking for microscopic changes to the surface of your cervix (Pap test). You may be encouraged to have this screening done every 3 years, beginning at age 21.  For women ages 30-65, health care providers may recommend pelvic exams and Pap testing every 3 years, or they may recommend the Pap and pelvic exam, combined with testing for human papilloma virus (HPV), every 5 years. Some types of HPV increase your risk of cervical cancer. Testing for HPV may also be done on women of any age with unclear Pap test results.  Other health care providers may not recommend any screening for nonpregnant women who are considered low risk for pelvic cancer and who do not have symptoms. Ask your health care provider if a screening pelvic exam is right for you.  If you have had past treatment for cervical cancer or a condition that could lead to cancer, you need Pap tests and screening for cancer for at least 20 years after your treatment. If Pap tests have been discontinued, your risk factors (such as having a new sexual partner) need to be reassessed to determine if screening should resume. Some women have medical problems that increase the chance of getting cervical cancer. In these cases, your health care provider may recommend more frequent screening and Pap tests.  Colorectal Cancer  This type of cancer can be detected and often prevented.  Routine colorectal cancer screening usually begins at 68 years of age and continues through 68 years of age.  Your health care provider may recommend screening at an earlier age if you have risk factors  for colon cancer.  Your health care provider may also recommend using home test kits to check for hidden blood in the stool.  A small camera at the end of a tube can be used to examine your colon directly (sigmoidoscopy or colonoscopy). This is done to check for the earliest forms of colorectal cancer.  Routine screening usually begins at age 50.  Direct examination of the colon should be repeated every 5-10 years through 68 years of age. However, you may need to be screened more often if early forms of precancerous polyps or small growths are found.  Skin Cancer  Check your skin from head to toe regularly.  Tell your health care provider about any new moles or changes in moles, especially if there is a change in a mole's shape or color.  Also tell your health care provider if you have a mole that is larger than the size of a pencil eraser.  Always use sunscreen. Apply sunscreen liberally and repeatedly throughout the day.  Protect yourself by wearing long sleeves, pants, a wide-brimmed hat, and   sunglasses whenever you are outside.  Heart disease, diabetes, and high blood pressure  High blood pressure causes heart disease and increases the risk of stroke. High blood pressure is more likely to develop in: ? People who have blood pressure in the high end of the normal range (130-139/85-89 mm Hg). ? People who are overweight or obese. ? People who are African American.  If you are 25-70 years of age, have your blood pressure checked every 3-5 years. If you are 86 years of age or older, have your blood pressure checked every year. You should have your blood pressure measured twice-once when you are at a hospital or clinic, and once when you are not at a hospital or clinic. Record the average of the two measurements. To check your blood pressure when you are not at a hospital or clinic, you can use: ? An automated blood pressure machine at a pharmacy. ? A home blood pressure monitor.  If  you are between 34 years and 33 years old, ask your health care provider if you should take aspirin to prevent strokes.  Have regular diabetes screenings. This involves taking a blood sample to check your fasting blood sugar level. ? If you are at a normal weight and have a low risk for diabetes, have this test once every three years after 68 years of age. ? If you are overweight and have a high risk for diabetes, consider being tested at a younger age or more often. Preventing infection Hepatitis B  If you have a higher risk for hepatitis B, you should be screened for this virus. You are considered at high risk for hepatitis B if: ? You were born in a country where hepatitis B is common. Ask your health care provider which countries are considered high risk. ? Your parents were born in a high-risk country, and you have not been immunized against hepatitis B (hepatitis B vaccine). ? You have HIV or AIDS. ? You use needles to inject street drugs. ? You live with someone who has hepatitis B. ? You have had sex with someone who has hepatitis B. ? You get hemodialysis treatment. ? You take certain medicines for conditions, including cancer, organ transplantation, and autoimmune conditions.  Hepatitis C  Blood testing is recommended for: ? Everyone born from [birth year] through 1966. ? Anyone with known risk factors for hepatitis C.  Sexually transmitted infections (STIs)  You should be screened for sexually transmitted infections (STIs) including gonorrhea and chlamydia if: ? You are sexually active and are younger than 68 years of age. ? You are older than 68 years of age and your health care provider tells you that you are at risk for this type of infection. ? Your sexual activity has changed since you were last screened and you are at an increased risk for chlamydia or gonorrhea. Ask your health care provider if you are at risk.  If you do not have HIV, but are at risk, it may be recommended  that you take a prescription medicine daily to prevent HIV infection. This is called pre-exposure prophylaxis (PrEP). You are considered at risk if: ? You are sexually active and do not regularly use condoms or know the HIV status of your partner(s). ? You take drugs by injection. ? You are sexually active with a partner who has HIV.  Talk with your health care provider about whether you are at high risk of being infected with HIV. If you choose to begin PrEP, you  should first be tested for HIV. You should then be tested every 3 months for as long as you are taking PrEP. Pregnancy  If you are premenopausal and you may become pregnant, ask your health care provider about preconception counseling.  If you may become pregnant, take 400 to 800 micrograms (mcg) of folic acid every day.  If you want to prevent pregnancy, talk to your health care provider about birth control (contraception). Osteoporosis and menopause  Osteoporosis is a disease in which the bones lose minerals and strength with aging. This can result in serious bone fractures. Your risk for osteoporosis can be identified using a bone density scan.  If you are 65 years of age or older, or if you are at risk for osteoporosis and fractures, ask your health care provider if you should be screened.  Ask your health care provider whether you should take a calcium or vitamin D supplement to lower your risk for osteoporosis.  Menopause may have certain physical symptoms and risks.  Hormone replacement therapy may reduce some of these symptoms and risks. Talk to your health care provider about whether hormone replacement therapy is right for you. Follow these instructions at home:  Schedule regular health, dental, and eye exams.  Stay current with your immunizations.  Do not use any tobacco products including cigarettes, chewing tobacco, or electronic cigarettes.  If you are pregnant, do not drink alcohol.  If you are  breastfeeding, limit how much and how often you drink alcohol.  Limit alcohol intake to no more than 1 drink per day for nonpregnant women. One drink equals 12 ounces of beer, 5 ounces of wine, or 1 ounces of hard liquor.  Do not use street drugs.  Do not share needles.  Ask your health care provider for help if you need support or information about quitting drugs.  Tell your health care provider if you often feel depressed.  Tell your health care provider if you have ever been abused or do not feel safe at home. This information is not intended to replace advice given to you by your health care provider. Make sure you discuss any questions you have with your health care provider. Document Released: 07/31/2010 Document Revised: 06/23/2015 Document Reviewed: 10/19/2014 Elsevier Interactive Patient Education  2018 Elsevier Inc.  

## 2017-04-09 NOTE — Progress Notes (Addendum)
Subjective:   Rebecca Long is a 67 y.o. female who presents for Medicare Annual (Subsequent) preventive examination.  Review of Systems:  No ROS.  Medicare Wellness Visit. Additional risk factors are reflected in the social history.  Cardiac Risk Factors include: advanced age (>16men, >15 women);dyslipidemia;hypertension   Sleep patterns: Sleeps 8-9 hours, Ambien 2.5 mg nightly.  Home Safety/Smoke Alarms: Feels safe in home. Smoke alarms in place.  Living environment; residence and Firearm Safety: Lives with husband in 2 story home.  Seat Belt Safety/Bike Helmet: Wears seat belt.   Female:   Pap-02/05/2013       Mammo-05/28/2016 (w/ [place]), normal. [place]. Will schedule for 04/2017.  Dexa scan-11/23/2015, Osteopenia.          [facility]-Colonoscopy 07/2013, mild diverticulosis. Recall 5 years     Objective:     Vitals: BP 120/76 (BP Location: Left Arm, Patient Position: Sitting, Cuff Size: Normal)   Pulse 70   Ht 5\' 3"  (1.6 m)   Wt 165 lb 12.8 oz (75.2 kg)   SpO2 98%   BMI 29.37 kg/m   Body mass index is 29.37 kg/m.  Advanced Directives 04/09/2017 10/18/2015 08/14/2013  Does Patient Have a Medical Advance Directive? Yes Yes Patient has advance directive, copy not in chart  Type of Advance Directive Living will;Healthcare Power of Attorney Living will;Healthcare Power of Attorney Living will;Healthcare Power of Attorney  Does patient want to make changes to medical advance directive? - No - Patient declined -  Copy of [place] in Chart? No - copy requested No - copy requested -    Tobacco Social History   Tobacco Use  Smoking Status Former Smoker  . Last attempt to quit: 01/30/1991  . Years since quitting: 26.2  Smokeless Tobacco Never Used     Counseling given: Not Answered    Past Medical History:  Diagnosis Date  . Achilles tendinitis   . Anxiety   . Blood transfusion without reported diagnosis 1980   ectopic pregnancy  . Colon polyps    Benign,  routine colonoscopy found  . Depression   . Hyperlipidemia   . Hypertension   . Insomnia   . Nerve pain    back  . Osteopenia   . Scoliosis   . Spinal stenosis    Past Surgical History:  Procedure Laterality Date  . BREAST REDUCTION SURGERY Bilateral 2000  . BREAST SURGERY    . CYST EXCISION  1985   head and rt axilla  . ECTOPIC PREGNANCY SURGERY  1981  . frozen shoulder Right 2007  . PARATHYROIDECTOMY  2008   Family History  Problem Relation Age of Onset  . Heart disease Father 81  . Cancer Mother   . Colon cancer Neg Hx    Social History   Socioeconomic History  . Marital status: Married    Spouse name: None  . Number of children: None  . Years of education: None  . Highest education level: None  Social Needs  . Financial resource strain: None  . Food insecurity - worry: None  . Food insecurity - inability: None  . Transportation needs - medical: None  . Transportation needs - non-medical: None  Occupational History  . None  Tobacco Use  . Smoking status: Former Smoker    Last attempt to quit: 01/30/1991    Years since quitting: 26.2  . Smokeless tobacco: Never Used  Substance and Sexual Activity  . Alcohol use: Yes    Comment: 1/year  .  Drug use: No  . Sexual activity: No  Other Topics Concern  . None  Social History Narrative   Lives with husband. Feels safe at home. 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 [employer]. Some college. Former smoker. Wears seat belt.     Outpatient Encounter Medications as of 04/09/2017  Medication Sig  . atorvastatin (LIPITOR) 10 MG tablet TAKE 1 TABLET DAILY  . cholecalciferol (VITAMIN D) 1000 UNITS tablet Take 1,000 Units by mouth daily.  Marland Kitchen FLUoxetine (PROZAC) 40 MG capsule Take 40 mg by mouth daily.  Marland Kitchen losartan (COZAAR) 100 MG tablet Take 1 tablet (100 mg total) by mouth daily.  . meloxicam (MOBIC) 7.5 MG tablet Take 1 tablet (7.5 mg total) by mouth daily.  . valACYclovir (VALTREX) 1000 MG tablet 1000 mg QD for 3-5 days  with onset of lesion.  Marland Kitchen zolpidem (AMBIEN) 5 MG tablet Take 5 mg by mouth at bedtime as needed.  Marland Kitchen azithromycin (ZITHROMAX) 500 MG tablet Take 1 tablet (500 mg total) by mouth daily. If you have 3+ loose stools in 24hr. Can stop taking if diarrhea resolves (Patient not taking: Reported on 04/09/2017)  . Multiple Vitamin (MULTIVITAMIN) tablet Take 1 tablet by mouth daily.  Marland Kitchen Zoster Vaccine Adjuvanted [employer]) injection Inject 0.5 mLs into the muscle once for 1 dose.   No facility-administered encounter medications on file as of 04/09/2017.     Activities of Daily Living In your present state of health, do you have any difficulty performing the following activities: 04/09/2017  Hearing? N  Vision? N  Difficulty concentrating or making decisions? N  Walking or climbing stairs? N  Dressing or bathing? N  Doing errands, shopping? N  Preparing Food and eating ? N  Using the Toilet? N  In the past six months, have you accidently leaked urine? Y  Do you have problems with loss of bowel control? N  Managing your Medications? N  Managing your Finances? N  Housekeeping or managing your Housekeeping? N  Some recent data might be hidden    Patient Care Team: Ma Hillock, DO as PCP - General (Family Medicine) Regal, Tamala Fothergill, DPM as Consulting Physician (Podiatry) Sydnee Levans, MD as Consulting Physician (Dermatology) Stefanie Libel, MD as Consulting Physician (Sports Medicine)    Assessment:   This is a routine wellness examination for Rebecca Long.  Exercise Activities and Dietary recommendations Current Exercise Habits: Structured exercise class, Type of exercise: Other - see comments(pickle ball), Time (Minutes): > 60, Frequency (Times/Week): 3, Weekly Exercise (Minutes/Week): 0, Exercise limited by: None identified   Diet (meal preparation, eat out, water intake, caffeinated beverages, dairy products, fruits and vegetables): Drinks water, coffee and diet soda.   Breakfast: cottage  cheese with fruit; crackers Lunch: leftovers  Dinner: lean meat with veggies Eats sweets often.       Goals    . Patient Stated     Maintain current health    . Weight (lb) < 150 lb (68 kg)     Plans to increase walking to mailbox, continue playing "pickle ball", will incorporate resistant bands at least twice a week.        Fall Risk Fall Risk  04/09/2017 11/12/2016 10/18/2015  Falls in the past year? No No No  Risk for fall due to : - - History of fall(s)  Risk for fall due to: Comment - - Fell down steps-concussion 2011    Depression Screen PHQ 2/9 Scores 04/09/2017 11/12/2016 11/12/2016 10/18/2015  PHQ - 2 Score 2 0  0 0  PHQ- 9 Score 10 5 - -     Cognitive Function MMSE - Mini Mental State Exam 04/09/2017 10/18/2015  Not completed: - (No Data)  Orientation to time 5 -  Orientation to Place 5 -  Registration 3 -  Attention/ Calculation 5 -  Recall 3 -  Language- name 2 objects 2 -  Language- repeat 1 -  Language- follow 3 step command 3 -  Language- read & follow direction 1 -  Write a sentence 1 -  Copy design 1 -  Total score 30 -        Immunization History  Administered Date(s) Administered  . Hepatitis A, Adult 11/30/2016  . Influenza, High Dose Seasonal PF 10/18/2015, 11/12/2016  . Influenza,inj,Quad PF,6+ Mos 10/06/2014  . Influenza-Unspecified 10/15/2012  . Pneumococcal Conjugate-13 10/29/2014  . Pneumococcal Polysaccharide-23 11/01/2015  . Td 10/24/2004  . Tdap 10/29/2014     Screening Tests Health Maintenance  Topic Date Due  . MAMMOGRAM  05/29/2018  . COLONOSCOPY  08/29/2018  . DEXA SCAN  11/23/2018  . TETANUS/TDAP  10/28/2024  . INFLUENZA VACCINE  Completed  . Hepatitis C Screening  Completed  . PNA vac Low Risk Adult  Completed        Plan:    Schedule mammogram in April.   Schedule eye exam.   Continue doing brain stimulating activities (puzzles, reading, adult coloring books, staying active) to keep memory sharp.   Bring a  copy of your living will and/or healthcare power of attorney to your next office visit.   I have personally reviewed and noted the following in the patient's chart:   . Medical and social history . Use of alcohol, tobacco or illicit drugs  . Current medications and supplements . Functional ability and status . Nutritional status . Physical activity . Advanced directives . List of other physicians . Hospitalizations, surgeries, and ER visits in previous 12 months . Vitals . Screenings to include cognitive, depression, and falls . Referrals and appointments  In addition, I have reviewed and discussed with patient certain preventive protocols, quality metrics, and best practice recommendations. A written personalized care plan for preventive services as well as general preventive health recommendations were provided to patient.     Gerilyn Nestle, RN  04/09/2017   PCP Notes: -PHQ9=10. States Fluoxetine is working, followed by therapist yearly.  -Would like to discuss possible referral for bladder tack.  -FU appt with PCP 05/15/17  Medical screening examination/treatment/procedure(s) were performed by non-physician practitioner and as supervising physician I was immediately available for consultation/collaboration.  I agree with above assessment and plan.  Electronically Signed by: Howard Pouch, DO [facility] [place]

## 2017-04-15 DIAGNOSIS — H524 Presbyopia: Secondary | ICD-10-CM | POA: Diagnosis not present

## 2017-04-15 DIAGNOSIS — H5203 Hypermetropia, bilateral: Secondary | ICD-10-CM | POA: Diagnosis not present

## 2017-04-15 DIAGNOSIS — H52223 Regular astigmatism, bilateral: Secondary | ICD-10-CM | POA: Diagnosis not present

## 2017-04-15 DIAGNOSIS — H25013 Cortical age-related cataract, bilateral: Secondary | ICD-10-CM | POA: Diagnosis not present

## 2017-04-15 DIAGNOSIS — H2513 Age-related nuclear cataract, bilateral: Secondary | ICD-10-CM | POA: Diagnosis not present

## 2017-04-19 ENCOUNTER — Other Ambulatory Visit: Payer: Self-pay | Admitting: *Deleted

## 2017-04-19 MED ORDER — ATORVASTATIN CALCIUM 10 MG PO TABS: 10.0000 mg | ORAL_TABLET | Freq: Every day | ORAL | 0 refills | Status: DC

## 2017-05-08 DIAGNOSIS — Z1231 Encounter for screening mammogram for malignant neoplasm of breast: Secondary | ICD-10-CM | POA: Diagnosis not present

## 2017-05-08 LAB — HM MAMMOGRAPHY

## 2017-05-10 ENCOUNTER — Encounter: Payer: Self-pay | Admitting: Family Medicine

## 2017-05-15 ENCOUNTER — Encounter: Payer: Self-pay | Admitting: Family Medicine

## 2017-05-15 ENCOUNTER — Ambulatory Visit (INDEPENDENT_AMBULATORY_CARE_PROVIDER_SITE_OTHER): Payer: Medicare HMO | Admitting: Family Medicine

## 2017-05-15 VITALS — BP 109/71 | HR 68 | Temp 97.5°F | Ht 63.0 in | Wt 163.0 lb

## 2017-05-15 DIAGNOSIS — E785 Hyperlipidemia, unspecified: Secondary | ICD-10-CM | POA: Insufficient documentation

## 2017-05-15 DIAGNOSIS — E663 Overweight: Secondary | ICD-10-CM | POA: Diagnosis not present

## 2017-05-15 DIAGNOSIS — N393 Stress incontinence (female) (male): Secondary | ICD-10-CM | POA: Diagnosis not present

## 2017-05-15 DIAGNOSIS — R69 Illness, unspecified: Secondary | ICD-10-CM | POA: Diagnosis not present

## 2017-05-15 DIAGNOSIS — I10 Essential (primary) hypertension: Secondary | ICD-10-CM | POA: Diagnosis not present

## 2017-05-15 DIAGNOSIS — F418 Other specified anxiety disorders: Secondary | ICD-10-CM | POA: Diagnosis not present

## 2017-05-15 LAB — LIPID PANEL
Cholesterol: 162 mg/dL (ref 0–200)
HDL: 63.3 mg/dL (ref >39.00)
LDL Cholesterol: 79 mg/dL (ref 0–99)
NonHDL: 98.42
Total CHOL/HDL Ratio: 3
Triglycerides: 96 mg/dL (ref 0.0–149.0)
VLDL: 19.2 mg/dL (ref 0.0–40.0)

## 2017-05-15 LAB — COMPREHENSIVE METABOLIC PANEL
ALT: 19 U/L (ref 0–35)
AST: 17 U/L (ref 0–37)
Albumin: 4.1 g/dL (ref 3.5–5.2)
Alkaline Phosphatase: 48 U/L (ref 39–117)
BUN: 11 mg/dL (ref 6–23)
CO2: 25 mEq/L (ref 19–32)
Calcium: 9.3 mg/dL (ref 8.4–10.5)
Chloride: 106 mEq/L (ref 96–112)
Creatinine, Ser: 0.71 mg/dL (ref 0.40–1.20)
GFR: 87.11 mL/min (ref >60.00)
Glucose, Bld: 88 mg/dL (ref 70–99)
Potassium: 4.2 mEq/L (ref 3.5–5.1)
Sodium: 140 mEq/L (ref 135–145)
Total Bilirubin: 0.8 mg/dL (ref 0.2–1.2)
Total Protein: 6.4 g/dL (ref 6.0–8.3)

## 2017-05-15 LAB — TSH: TSH: 1.6 u[IU]/mL (ref 0.35–4.50)

## 2017-05-15 LAB — CBC
HCT: 42.5 % (ref 36.0–46.0)
Hemoglobin: 14.1 g/dL (ref 12.0–15.0)
MCHC: 33.2 g/dL (ref 30.0–36.0)
MCV: 87.4 fl (ref 78.0–100.0)
Platelets: 279 10*3/uL (ref 150.0–400.0)
RBC: 4.87 Mil/uL (ref 3.87–5.11)
RDW: 14.4 % (ref 11.5–15.5)
WBC: 5.2 10*3/uL (ref 4.0–10.5)

## 2017-05-15 MED ORDER — ATORVASTATIN CALCIUM 10 MG PO TABS: 10.0000 mg | ORAL_TABLET | Freq: Every day | ORAL | 0 refills | Status: DC

## 2017-05-15 MED ORDER — LOSARTAN POTASSIUM 100 MG PO TABS: 100.0000 mg | ORAL_TABLET | Freq: Every day | ORAL | 1 refills | Status: DC

## 2017-05-15 NOTE — Progress Notes (Signed)
Rebecca Long , 01-12-1951, 67 y.o., female MRN: [id number] Patient Care Team    Relationship Specialty Notifications Start End  Ma Hillock, DO PCP - General Family Medicine  10/06/14   Wallene Huh, [place] Consulting Physician Podiatry  10/18/15   Sydnee Levans, MD Consulting Physician Dermatology  10/18/15   Stefanie Libel, MD Consulting Physician Sports Medicine  11/01/15     Chief Complaint  Patient presents with  . Follow-up    HTN/DEPRESSION    Subjective:   Hypertension/hyperlipidemia/overweight:   Pt reports compliance with Losartan 100 mg daily, Lipitor 10 mg daily and daily baby aspirin. Blood pressures ranges at home not routinely checked. Patient denies chest pain, shortness of breath, dizziness or lower extremity edema. Pt endorses use of daily baby ASA. Pt is  prescribed statin. BMP: 11/10/2015 WNL CBC: 11/10/2015 WNL Lipid: 11/10/2015 WNL Exercise: exercise and pickle ball.  RF: HTN, HLD, Fhx Heart disease  Stress incontinence:  Pt reports worsening incontinence. She states she has always had some incontinence when sneezing etc, but over the last 2 months she has noticed a more frequent occurrence of incontinence with physical activity, laughing, coughing and sneezing. She has started weering a pad. She had one vaginal delivery and reports the baby was small.  She has all female organs. She denies nocturia or urgency.   Depression/anxiety: Followed by Jobie Quaker, prescribed Ambien and prozac. Reports she is doing ok, despite the screening results.   Depression screen [facility] 2/9 05/15/2017 04/09/2017 11/12/2016 11/12/2016 10/18/2015  Decreased Interest 1 1 0 0 0  Down, Depressed, Hopeless 1 1 0 0 0  PHQ - 2 Score 2 2 0 0 0  Altered sleeping 1 1 0 - -  Tired, decreased energy '2 3 3 '$ - -  Change in appetite 2 1 0 - -  Feeling bad or failure about yourself  1 1 0 - -  Trouble concentrating '1 1 2 '$ - -  Moving slowly or fidgety/restless 0 1 0 - -  Suicidal thoughts 0 0  0 - -  PHQ-9 Score '9 10 5 '$ - -  Difficult doing work/chores Not difficult at all Not difficult at all - - -    Allergies  Allergen Reactions  . Latex Rash  . Polysporin [Bacitracin-Polymyxin B] Rash   Social History   Tobacco Use  . Smoking status: Former Smoker    Last attempt to quit: 01/30/1991    Years since quitting: 26.3  . Smokeless tobacco: Never Used  Substance Use Topics  . Alcohol use: Yes    Comment: 1/year   Past Medical History:  Diagnosis Date  . Achilles tendinitis   . Anxiety   . Blood transfusion without reported diagnosis 1980   ectopic pregnancy  . Colon polyps    Benign, routine colonoscopy found  . Depression   . Hyperlipidemia   . Hypertension   . Insomnia   . Nerve pain    back  . Osteopenia   . Scoliosis   . Spinal stenosis    Past Surgical History:  Procedure Laterality Date  . BREAST REDUCTION SURGERY Bilateral 2000  . BREAST SURGERY    . CYST EXCISION  1985   head and rt axilla  . ECTOPIC PREGNANCY SURGERY  1981  . frozen shoulder Right 2007  . PARATHYROIDECTOMY  2008   Family History  Problem Relation Age of Onset  . Heart disease Father 85  . Cancer Mother   . Colon cancer Neg Hx  Allergies as of 05/15/2017      Reactions   Latex Rash   Polysporin [bacitracin-polymyxin B] Rash      Medication List        Accurate as of 05/15/17  8:44 AM. Always use your most recent med list.          atorvastatin 10 MG tablet Commonly known as:  LIPITOR Take 1 tablet (10 mg total) by mouth daily.   cholecalciferol 1000 units tablet Commonly known as:  VITAMIN D Take 1,000 Units by mouth daily.   FLUoxetine 40 MG capsule Commonly known as:  PROZAC Take 40 mg by mouth daily.   losartan 100 MG tablet Commonly known as:  COZAAR Take 1 tablet (100 mg total) by mouth daily.   meloxicam 7.5 MG tablet Commonly known as:  MOBIC Take 1 tablet (7.5 mg total) by mouth daily.   multivitamin tablet Take 1 tablet by mouth daily.     valACYclovir 1000 MG tablet Commonly known as:  VALTREX 1000 mg QD for 3-5 days with onset of lesion.   zolpidem 5 MG tablet Commonly known as:  AMBIEN Take 5 mg by mouth at bedtime as needed.       No results found for this or any previous visit (from the past 24 hour(s)). No results found.   ROS: Negative, with the exception of above mentioned in HPI   Objective:  BP 109/71 (BP Location: Left Arm, Patient Position: Sitting, Cuff Size: Normal)   Pulse 68   Temp (!) 97.5 F (36.4 C) (Oral)   Ht '5\' 3"'$  (1.6 m)   Wt 163 lb (73.9 kg)   SpO2 98%   BMI 28.87 kg/m  Body mass index is 28.87 kg/m. Gen: Afebrile. No acute distress. Nontoxic in appearance, well developed, well nourished. Overweight caucasian female.  HENT: AT. [place]. MMM.  Eyes:Pupils Equal Round Reactive to light, Extraocular movements intact,  Conjunctiva without redness, discharge or icterus. Neck/lymp/endocrine: Supple CV: RRR no murmur, no edema, +2/4 P posterior tibialis pulses Chest: CTAB, no wheeze or crackles Abd: Soft. NTND. BS present. no Masses palpated.  Skin: no rashes, purpura or petechiae.  Neuro:  Normal gait. PERLA. EOMi. Alert. Oriented x3  Psych: Normal affect, dress and demeanor. Normal speech. Normal thought content and judgment  Assessment/Plan: AAROHI REDDITT is a 66 y.o. female present for acute OV for  Essential hypertension, benign/Hyperlipidemia, unspecified hyperlipidemia type/overweight -  stable.  - Continue exercising at least 150 minutes a week. Low-sodium diet. - Continue losartan 100 mg daily, daily baby aspirin, and Lipitor 10 mg daily. Refills provided today on losartan. - Comp Met (CMET) - TSH - CBC - Lipid panel - atorvastatin (LIPITOR) 10 MG tablet; Take 1 tablet (10 mg total) by mouth daily.  Dispense: 90 tablet; Refill: 0 - losartan (COZAAR) 100 MG tablet; Take 1 tablet (100 mg total) by mouth daily.  Dispense: 90 tablet; Refill: 1 - atorvastatin (LIPITOR) 10 MG  tablet; Take 1 tablet (10 mg total) by mouth daily.  Dispense: 90 tablet; Refill: 0 - F/U 6 months as long as doing well.   Stress incontinence - worsening incontinence, by report seems mostly stress incontinence. Recommended GYN eval for potential cystocele and stress incontinence. She is agreeable to this today.  - kegel exercises recommended and AVS instructions provided.  - Ambulatory referral to Gynecology  Depression with anxiety: Patient follows with psychiatry. PHQ9 completed today. Pt reports she is doing well.   electronically signed by:  Howard Pouch,  DO  [facility] - [place]

## 2017-05-15 NOTE — Patient Instructions (Signed)
I will place a referral to GYN for eval on incontinence.   I have refilled your medications. We will call you with your labs.  Your BP looks great.    Urinary Incontinence Urinary incontinence is the involuntary loss of urine from your bladder. What are the causes? There are many causes of urinary incontinence. They include:  Medicines.  Infections.  Prostatic enlargement, leading to overflow of urine from your bladder.  Surgery.  Neurological diseases.  Emotional factors.  What are the signs or symptoms? Urinary Incontinence can be divided into four types: 1. Urge incontinence. Urge incontinence is the involuntary loss of urine before you have the opportunity to go to the bathroom. There is a sudden urge to void but not enough time to reach a bathroom. 2. Stress incontinence. Stress incontinence is the sudden loss of urine with any activity that forces urine to pass. It is commonly caused by anatomical changes to the pelvis and sphincter areas of your body. 3. Overflow incontinence. Overflow incontinence is the loss of urine from an obstructed opening to your bladder. This results in a backup of urine and a resultant buildup of pressure within the bladder. When the pressure within the bladder exceeds the closing pressure of the sphincter, the urine overflows, which causes incontinence, similar to water overflowing a dam. 4. Total incontinence. Total incontinence is the loss of urine as a result of the inability to store urine within your bladder.  How is this diagnosed? Evaluating the cause of incontinence may require:  A thorough and complete medical and obstetric history.  A complete physical exam.  Laboratory tests such as a urine culture and sensitivities.  When additional tests are indicated, they can include:  An ultrasound exam.  Kidney and bladder X-rays.  Cystoscopy. This is an exam of the bladder using a narrow scope.  Urodynamic testing to test the nerve  function to the bladder and sphincter areas.  How is this treated? Treatment for urinary incontinence depends on the cause:  For urge incontinence caused by a bacterial infection, antibiotics will be prescribed. If the urge incontinence is related to medicines you take, your health care provider may have you change the medicine.  For stress incontinence, surgery to re-establish anatomical support to the bladder or sphincter, or both, will often correct the condition.  For overflow incontinence caused by an enlarged prostate, an operation to open the channel through the enlarged prostate will allow the flow of urine out of the bladder. In women with fibroids, a hysterectomy may be recommended.  For total incontinence, surgery on your urinary sphincter may help. An artificial urinary sphincter (an inflatable cuff placed around the urethra) may be required. In women who have developed a hole-like passage between their bladder and vagina (vesicovaginal fistula), surgery to close the fistula often is required.  Follow these instructions at home:  Normal daily hygiene and the use of pads or adult diapers that are changed regularly will help prevent odors and skin damage.  Avoid caffeine. It can overstimulate your bladder.  Use the bathroom regularly. Try about every 2-3 hours to go to the bathroom, even if you do not feel the need to do so. Take time to empty your bladder completely. After urinating, wait a minute. Then try to urinate again.  For causes involving nerve dysfunction, keep a log of the medicines you take and a journal of the times you go to the bathroom. Contact a health care provider if:  You experience worsening of pain  instead of improvement in pain after your procedure.  Your incontinence becomes worse instead of better. Get help right away if:  You experience fever or shaking chills.  You are unable to pass your urine.  You have redness spreading into your groin or down  into your thighs. This information is not intended to replace advice given to you by your health care provider. Make sure you discuss any questions you have with your health care provider. Document Released: 02/23/2004 Document Revised: 08/26/2015 Document Reviewed: 06/24/2012 Elsevier Interactive Patient Education  2018 [employer].  Kegel Exercises Kegel exercises help strengthen the muscles that support the rectum, vagina, small intestine, bladder, and uterus. Doing Kegel exercises can help:  Improve bladder and bowel control.  Improve sexual response.  Reduce problems and discomfort during pregnancy.  Kegel exercises involve squeezing your pelvic floor muscles, which are the same muscles you squeeze when you try to stop the flow of urine. The exercises can be done while sitting, standing, or lying down, but it is best to vary your position. Phase 1 exercises 5. Squeeze your pelvic floor muscles tight. You should feel a tight lift in your rectal area. If you are a female, you should also feel a tightness in your vaginal area. Keep your stomach, buttocks, and legs relaxed. 6. Hold the muscles tight for up to 10 seconds. 7. Relax your muscles. Repeat this exercise 50 times a day or as many times as told by your health care provider. Continue to do this exercise for at least 4-6 weeks or for as long as told by your health care provider. This information is not intended to replace advice given to you by your health care provider. Make sure you discuss any questions you have with your health care provider. Document Released: 01/02/2012 Document Revised: 09/10/2015 Document Reviewed: 12/05/2014 Elsevier Interactive Patient Education  [employer].

## 2017-06-05 ENCOUNTER — Telehealth: Payer: Self-pay | Admitting: Family Medicine

## 2017-06-05 NOTE — Telephone Encounter (Signed)
Spoke with patient scheduled appt for evaluation of her rash.

## 2017-06-05 NOTE — Telephone Encounter (Signed)
Copied from [place] [id number]. Topic: Quick Communication - See Telephone Encounter >> Jun 05, 2017  2:40 PM Aurelio Brash B wrote: CRM for notification. See Telephone encounter for: 06/05/17. Pt is requesting  an antibiotic   for a nose rash  that she has had in the past and states only an antibiotic  clears  it up   [id number] - [place], [address] [phone] (Phone) [phone] (Fax)

## 2017-06-06 ENCOUNTER — Encounter: Payer: Self-pay | Admitting: Family Medicine

## 2017-06-06 ENCOUNTER — Ambulatory Visit (INDEPENDENT_AMBULATORY_CARE_PROVIDER_SITE_OTHER): Payer: Medicare HMO | Admitting: Family Medicine

## 2017-06-06 VITALS — BP 125/85 | HR 60 | Temp 98.2°F | Resp 20 | Ht 63.0 in | Wt 162.0 lb

## 2017-06-06 DIAGNOSIS — R21 Rash and other nonspecific skin eruption: Secondary | ICD-10-CM

## 2017-06-06 MED ORDER — DOXYCYCLINE HYCLATE 100 MG PO TABS: 100.0000 mg | ORAL_TABLET | Freq: Two times a day (BID) | ORAL | 0 refills | Status: DC

## 2017-06-06 NOTE — Progress Notes (Signed)
Rebecca Long , February 25, 1950, 67 y.o., female MRN: [id number] Patient Care Team    Relationship Specialty Notifications Start End  Ma Hillock, DO PCP - General Family Medicine  10/06/14   Wallene Huh, [place] Consulting Physician Podiatry  10/18/15   Sydnee Levans, MD Consulting Physician Dermatology  10/18/15   Stefanie Libel, MD Consulting Physician Sports Medicine  11/01/15     Chief Complaint  Patient presents with  . Rash    face     Subjective: Pt presents for an OV with complaints of rash of 2-3 weeks duration.  Associated symptoms include itching red raised rash. She reports she had a similar rash a few years ago and dermatology tried many topical creams without resolution and then she was tried on oral abx that cleared the rash. She is uncertain the diagnosis or abx. Pt has tried nothing to ease their symptoms.   Depression screen [facility] 2/9 05/15/2017 04/09/2017 11/12/2016 11/12/2016 10/18/2015  Decreased Interest 1 1 0 0 0  Down, Depressed, Hopeless 1 1 0 0 0  PHQ - 2 Score 2 2 0 0 0  Altered sleeping 1 1 0 - -  Tired, decreased energy 2 3 3  - -  Change in appetite 2 1 0 - -  Feeling bad or failure about yourself  1 1 0 - -  Trouble concentrating 1 1 2  - -  Moving slowly or fidgety/restless 0 1 0 - -  Suicidal thoughts 0 0 0 - -  PHQ-9 Score 9 10 5  - -  Difficult doing work/chores Not difficult at all Not difficult at all - - -    Allergies  Allergen Reactions  . Latex Rash  . Polysporin [Bacitracin-Polymyxin B] Rash   Social History   Tobacco Use  . Smoking status: Former Smoker    Last attempt to quit: 01/30/1991    Years since quitting: 26.3  . Smokeless tobacco: Never Used  Substance Use Topics  . Alcohol use: Yes    Comment: 1/year   Past Medical History:  Diagnosis Date  . Achilles tendinitis   . Anxiety   . Blood transfusion without reported diagnosis 1980   ectopic pregnancy  . Colon polyps    Benign, routine colonoscopy found  . Depression     . Hyperlipidemia   . Hypertension   . Insomnia   . Nerve pain    back  . Osteopenia   . Scoliosis   . Spinal stenosis    Past Surgical History:  Procedure Laterality Date  . BREAST REDUCTION SURGERY Bilateral 2000  . BREAST SURGERY    . CYST EXCISION  1985   head and rt axilla  . ECTOPIC PREGNANCY SURGERY  1981  . frozen shoulder Right 2007  . PARATHYROIDECTOMY  2008   Family History  Problem Relation Age of Onset  . Heart disease Father 28  . Cancer Mother   . Colon cancer Neg Hx    Allergies as of 06/06/2017      Reactions   Latex Rash   Polysporin [bacitracin-polymyxin B] Rash      Medication List        Accurate as of 06/06/17  9:20 AM. Always use your most recent med list.          atorvastatin 10 MG tablet Commonly known as:  LIPITOR Take 1 tablet (10 mg total) by mouth daily.   cholecalciferol 1000 units tablet Commonly known as:  VITAMIN D Take 1,000 Units by  mouth daily.   FLUoxetine 40 MG capsule Commonly known as:  PROZAC Take 40 mg by mouth daily.   losartan 100 MG tablet Commonly known as:  COZAAR Take 1 tablet (100 mg total) by mouth daily.   meloxicam 7.5 MG tablet Commonly known as:  MOBIC Take 1 tablet (7.5 mg total) by mouth daily.   multivitamin tablet Take 1 tablet by mouth daily.   valACYclovir 1000 MG tablet Commonly known as:  VALTREX 1000 mg QD for 3-5 days with onset of lesion.   zolpidem 5 MG tablet Commonly known as:  AMBIEN Take 5 mg by mouth at bedtime as needed.       All past medical history, surgical history, allergies, family history, immunizations andmedications were updated in the EMR today and reviewed under the history and medication portions of their EMR.     ROS: Negative, with the exception of above mentioned in HPI   Objective:  BP 125/85 (BP Location: Right Arm, Patient Position: Sitting, Cuff Size: Normal)   Pulse 60   Temp 98.2 F (36.8 C)   Resp 20   Ht 5\' 3"  (1.6 m)   Wt 162 lb (73.5 kg)    SpO2 96%   BMI 28.70 kg/m  Body mass index is 28.7 kg/m. Gen: Afebrile. No acute distress. Nontoxic in appearance, well developed, well nourished.  HENT: AT. [place]. MMM Eyes:Pupils Equal Round Reactive to light, Extraocular movements intact,  Conjunctiva without redness, discharge or icterus. Skin: left nasolabial fold red raised rash, no vesicales. no purpura or petechiae. No drainage.    No exam data present No results found. No results found for this or any previous visit (from the past 24 hour(s)).  Assessment/Plan: Rebecca Long is a 67 y.o. female present for OV for  Rash - possible impetigo, given location and h/o prior similar infection  A few years ago on her nasal labial fold and chin, which was resistant to topical treatments.  - trial of doxy BID 5-7 days.  - F/U PRN   Reviewed expectations re: course of current medical issues.  Discussed self-management of symptoms.  Outlined signs and symptoms indicating need for more acute intervention.  Patient verbalized understanding and all questions were answered.  Patient received an After-Visit Summary.    No orders of the defined types were placed in this encounter.    Note is dictated utilizing voice recognition software. Although note has been proof read prior to signing, occasional typographical errors still can be missed. If any questions arise, please do not hesitate to call for verification.   electronically signed by:  Howard Pouch, DO  [place]

## 2017-06-06 NOTE — Patient Instructions (Addendum)
Doxycyline every 12 hours for 5-7 days.  Keep face clean.    Please help [place] help you:  We are honored you have chosen [facility] for your Primary Care home. Below you will find basic instructions that you may need to access in the future. Please help [place] help you by reading the instructions, which cover many of the frequent questions we experience.   Prescription refills and request:  -In order to allow more efficient response time, please call your pharmacy for all refills. They will forward the request electronically to [place]. This allows for the quickest possible response. Request left on a nurse line can take longer to refill, since these are checked as time allows between office patients and other phone calls.  - refill request can take up to 3-5 working days to complete.  - If request is sent electronically and request is appropiate, it is usually completed in 1-2 business days.  - all patients will need to be seen routinely for all chronic medical conditions requiring prescription medications (see follow-up below). If you are overdue for follow up on your condition, you will be asked to make an appointment and we will call in enough medication to cover you until your appointment (up to 30 days).  - all controlled substances will require a face to face visit to request/refill.  - if you desire your prescriptions to go through a new pharmacy, and have an active script at original pharmacy, you will need to call your pharmacy and have scripts transferred to new pharmacy. This is completed between the pharmacy locations and not by your provider.    Results: If any images or labs were ordered, it can take up to 1 week to get results depending on the test ordered and the lab/facility running and resulting the test. - Normal or stable results, which do not need further discussion, may be released to your mychart immediately with attached note to you. A call may not be generated for normal  results. Please make certain to sign up for mychart. If you have questions on how to activate your mychart you can call the front office.  - If your results need further discussion, our office will attempt to contact you via phone, and if unable to reach you after 2 attempts, we will release your abnormal result to your mychart with instructions.  - All results will be automatically released in mychart after 1 week.  - Your provider will provide you with explanation and instruction on all relevant material in your results. Please keep in mind, results and labs may appear confusing or abnormal to the untrained eye, but it does not mean they are actually abnormal for you personally. If you have any questions about your results that are not covered, or you desire more detailed explanation than what was provided, you should make an appointment with your provider to do so.   Our office handles many outgoing and incoming calls daily. If we have not contacted you within 1 week about your results, please check your mychart to see if there is a message first and if not, then contact our office.  In helping with this matter, you help decrease call volume, and therefore allow [place] to be able to respond to patients needs more efficiently.   Acute office visits (sick visit):  An acute visit is intended for a new problem and are scheduled in shorter time slots to allow schedule openings for patients with new problems. This is  the appropriate visit to discuss a new problem. Problems will not be addressed by phone call or Echart message. Appointment is needed if requesting treatment. In order to provide you with excellent quality medical care with proper time for you to explain your problem, have an exam and receive treatment with instructions, these appointments should be limited to one new problem per visit. If you experience a new problem, in which you desire to be addressed, please make an acute office visit, we save  openings on the schedule to accommodate you. Please do not save your new problem for any other type of visit, let us take care of it properly and quickly for you.   Follow up visits:  Depending on your condition(s) your provider will need to see you routinely in order to provide you with quality care and prescribe medication(s). Most chronic conditions (Example: hypertension, Diabetes, depression/anxiety... etc), require visits a couple times a year. Your provider will instruct you on proper follow up for your personal medical conditions and history. Please make certain to make follow up appointments for your condition as instructed. Failing to do so could result in lapse in your medication treatment/refills. If you request a refill, and are overdue to be seen on a condition, we will always provide you with a 30 day script (once) to allow you time to schedule.    Medicare wellness (well visit): - we have a wonderful Nurse Maudie Mercury), that will meet with you and provide you will yearly medicare wellness visits. These visits should occur yearly (can not be scheduled less than 1 calendar year apart) and cover preventive health, immunizations, advance directives and screenings you are entitled to yearly through your medicare benefits. Do not miss out on your entitled benefits, this is when medicare will pay for these benefits to be ordered for you.  These are strongly encouraged by your provider and is the appropriate type of visit to make certain you are up to date with all preventive health benefits. If you have not had your medicare wellness exam in the last 12 months, please make certain to schedule one by calling the office and schedule your medicare wellness with Maudie Mercury as soon as possible.   Yearly physical (well visit):  - Adults are recommended to be seen yearly for physicals. Check with your insurance and date of your last physical, most insurances require one calendar year between physicals. Physicals  include all preventive health topics, screenings, medical exam and labs that are appropriate for gender/age and history. You may have fasting labs needed at this visit. This is a well visit (not a sick visit), new problems should not be covered during this visit (see acute visit).  - Pediatric patients are seen more frequently when they are younger. Your provider will advise you on well child visit timing that is appropriate for your their age. - This is not a medicare wellness visit. Medicare wellness exams do not have an exam portion to the visit. Some medicare companies allow for a physical, some do not allow a yearly physical. If your medicare allows a yearly physical you can schedule the medicare wellness with our nurse Maudie Mercury and have your physical with your provider after, on the same day. Please check with insurance for your full benefits.   Late Policy/No Shows:  - all new patients should arrive 15-30 minutes earlier than appointment to allow [place] time  to  obtain all personal demographics,  insurance information and for you to complete office paperwork. -  All established patients should arrive 10-15 minutes earlier than appointment time to update all information and be checked in .  - In our best efforts to run on time, if you are late for your appointment you will be asked to either reschedule or if able, we will work you back into the schedule. There will be a wait time to work you back in the schedule,  depending on availability.  - If you are unable to make it to your appointment as scheduled, please call 24 hours ahead of time to allow [place] to fill the time slot with someone else who needs to be seen. If you do not cancel your appointment ahead of time, you may be charged a no show fee.

## 2017-06-12 ENCOUNTER — Telehealth: Payer: Self-pay | Admitting: Family Medicine

## 2017-06-12 MED ORDER — DOXYCYCLINE HYCLATE 100 MG PO TABS: 100.0000 mg | ORAL_TABLET | Freq: Two times a day (BID) | ORAL | 0 refills | Status: DC

## 2017-06-12 NOTE — Telephone Encounter (Signed)
Copied from [place] [id number]. Topic: Quick Communication - See Telephone Encounter >> Jun 12, 2017  1:39 PM Bea Graff, NT wrote: CRM for notification. See Telephone encounter for: 06/12/17. Pt states she will finish her antibiotic for the rash on her nose today and that the rash is not close to being any better. She states that when she had this issue back in 2015 a prescription for doxycycline had to be prescribed for a month to clear the rash up. She would like to see if something else can be called in.

## 2017-06-12 NOTE — Telephone Encounter (Signed)
Doxy prescribed for 1 month, if symptoms are no improved after completion she will need referral to Dermatology which we would be happy to place for her without appt. Since she has been seen for this issue.

## 2017-06-12 NOTE — Telephone Encounter (Signed)
Spoke with patient reviewed lab information and instructions. Patient verbalized understanding.

## 2017-06-21 ENCOUNTER — Ambulatory Visit: Payer: Medicare Other | Admitting: Obstetrics & Gynecology

## 2017-07-25 ENCOUNTER — Other Ambulatory Visit: Payer: Self-pay | Admitting: *Deleted

## 2017-07-25 MED ORDER — MELOXICAM 7.5 MG PO TABS: 7.5000 mg | ORAL_TABLET | Freq: Every day | ORAL | 2 refills | Status: DC

## 2017-08-06 DIAGNOSIS — Z8249 Family history of ischemic heart disease and other diseases of the circulatory system: Secondary | ICD-10-CM | POA: Diagnosis not present

## 2017-08-06 DIAGNOSIS — Z87891 Personal history of nicotine dependence: Secondary | ICD-10-CM | POA: Diagnosis not present

## 2017-08-06 DIAGNOSIS — E785 Hyperlipidemia, unspecified: Secondary | ICD-10-CM | POA: Diagnosis not present

## 2017-08-06 DIAGNOSIS — I10 Essential (primary) hypertension: Secondary | ICD-10-CM | POA: Diagnosis not present

## 2017-08-06 DIAGNOSIS — F419 Anxiety disorder, unspecified: Secondary | ICD-10-CM | POA: Diagnosis not present

## 2017-08-06 DIAGNOSIS — R69 Illness, unspecified: Secondary | ICD-10-CM | POA: Diagnosis not present

## 2017-08-06 DIAGNOSIS — G4700 Insomnia, unspecified: Secondary | ICD-10-CM | POA: Diagnosis not present

## 2017-08-06 DIAGNOSIS — R32 Unspecified urinary incontinence: Secondary | ICD-10-CM | POA: Diagnosis not present

## 2017-08-12 DIAGNOSIS — R69 Illness, unspecified: Secondary | ICD-10-CM | POA: Diagnosis not present

## 2017-08-21 ENCOUNTER — Encounter: Payer: Self-pay | Admitting: Obstetrics & Gynecology

## 2017-08-21 ENCOUNTER — Ambulatory Visit (INDEPENDENT_AMBULATORY_CARE_PROVIDER_SITE_OTHER): Payer: Medicare HMO | Admitting: Obstetrics & Gynecology

## 2017-08-21 VITALS — BP 124/78 | Ht 62.5 in | Wt 165.0 lb

## 2017-08-21 DIAGNOSIS — Z9189 Other specified personal risk factors, not elsewhere classified: Secondary | ICD-10-CM | POA: Diagnosis not present

## 2017-08-21 DIAGNOSIS — R102 Pelvic and perineal pain: Secondary | ICD-10-CM | POA: Diagnosis not present

## 2017-08-21 DIAGNOSIS — Z780 Asymptomatic menopausal state: Secondary | ICD-10-CM

## 2017-08-21 DIAGNOSIS — Z01419 Encounter for gynecological examination (general) (routine) without abnormal findings: Secondary | ICD-10-CM | POA: Diagnosis not present

## 2017-08-21 DIAGNOSIS — N393 Stress incontinence (female) (male): Secondary | ICD-10-CM

## 2017-08-21 DIAGNOSIS — N6320 Unspecified lump in the left breast, unspecified quadrant: Secondary | ICD-10-CM

## 2017-08-21 NOTE — Progress Notes (Signed)
Rebecca Long [birth year] [id number]   History:    67 y.o. G2P1A1L1  Married.  Retired.  RP:  New patient presenting for annual gyn exam   HPI: C/O sudden pelvic pain while playing pickle ball and very intense for a couple of hours after playing 2 days ago.  The pelvic pain has persisted, but more like a discomfort.  No vaginal bulging or protrusion.  Menopause, well on no HRT.  No PMB.  Normal vaginal secretions.  Abstinent.  Urine with some frequency, but no burning.  Mild SUI with coughing.  BMs wnl.  No fever.  Breasts s/p bilateral reduction normal.  BMI 29.70.  Health labs with Fam MD.  Past medical history,surgical history, family history and social history were all reviewed and documented in the EPIC chart.  Gynecologic History No LMP recorded. Patient is postmenopausal. Contraception: abstinence and post menopausal status Last Pap: 2015. Results were: normal Last mammogram: 04/2017. Results were: Negative Bone Density: 10/2015 Osteopenia T-Score -1.8 Colonoscopy: 07/2013, 5 yr schedule  Obstetric History OB History  Gravida Para Term Preterm AB Living  2 1     1 1   SAB TAB Ectopic Multiple Live Births      1        # Outcome Date GA Lbr Len/2nd Weight Sex Delivery Anes PTL Lv  2 Ectopic           1 Para              ROS: A ROS was performed and pertinent positives and negatives are included in the history.  GENERAL: No fevers or chills. HEENT: No change in vision, no earache, sore throat or sinus congestion. NECK: No pain or stiffness. CARDIOVASCULAR: No chest pain or pressure. No palpitations. PULMONARY: No shortness of breath, cough or wheeze. GASTROINTESTINAL: No abdominal pain, nausea, vomiting or diarrhea, melena or bright red blood per rectum. GENITOURINARY: No urinary frequency, urgency, hesitancy or dysuria. MUSCULOSKELETAL: No joint or muscle pain, no back pain, no recent trauma. DERMATOLOGIC: No rash, no itching, no lesions. ENDOCRINE: No polyuria, polydipsia, no  heat or cold intolerance. No recent change in weight. HEMATOLOGICAL: No anemia or easy bruising or bleeding. NEUROLOGIC: No headache, seizures, numbness, tingling or weakness. PSYCHIATRIC: No depression, no loss of interest in normal activity or change in sleep pattern.     Exam:   BP 124/78   Ht 5' 2.5" (1.588 m)   Wt 165 lb (74.8 kg)   BMI 29.70 kg/m   Body mass index is 29.7 kg/m.  General appearance : Well developed well nourished female. No acute distress HEENT: Eyes: no retinal hemorrhage or exudates,  Neck supple, trachea midline, no carotid bruits, no thyroidmegaly Lungs: Clear to auscultation, no rhonchi or wheezes, or rib retractions  Heart: Regular rate and rhythm, no murmurs or gallops Breast:Examined in sitting and supine position were symmetrical in appearance, s/p bilateral reduction.  Rt breast: No palpable masses or tenderness,  no skin retraction, no nipple inversion, no nipple discharge, no skin discoloration, no axillary or supraclavicular lymphadenopathy.  Left breast:  Increased density/mass at 4 O'clock, 4 x 4 cm, mobile, mildly tender.   No skin retraction, no nipple inversion, no nipple discharge, no skin discoloration, no axillary or supraclavicular lymphadenopathy.  Abdomen: no palpable masses or tenderness, no rebound or guarding Extremities: no edema or skin discoloration or tenderness  Pelvic: Vulva: Normal             Vagina: No gross lesions or  discharge  Cervix: No gross lesions or discharge.  Pap reflex done.  Uterus  AV, normal size, shape and consistency, non-tender and mobile  Adnexa  Without masses or tenderness  Anus: Normal  U/A: Yellow clear, nitrites negative, blood 1+ with red blood cells 0-2, white blood cells 0-5, bacteria negative.   Assessment/Plan:  67 y.o. female for annual exam   1. Encounter for routine gynecological examination with Papanicolaou smear of cervix Normal gynecologic exam in menopause, except for mild tenderness of  the uterus.  Pap reflex done today.  Breast exam normal.  Mammogram negative in April 2019.  Colonoscopy in 2015.  2. Menopause present Menopause, well on no HRT.  No postmenopausal bleeding.  Last bone density showed osteopenia with a T score of -1.8 in October 2017.  Will repeat in October 2019.  Vitamin D supplements, calcium rich nutrition and regular physical activity recommended.  3. Left breast mass  Left breast:  Increased density/mass at 4 O'clock, 4 x 4 cm, mobile, mildly tender. Lt Dx mammo/US  4. Pelvic pain in female Increase with ambulation.  Patient will complete investigation with a pelvic ultrasound.  Urine analysis very mildly perturbed, culture pending. - US Transvaginal Non-OB; Future - Urinalysis,Complete w/RFL Culture  5. SUI (stress urinary incontinence, female) Kegel exercises instructed and recommended.  PT for reinforcement of pelvic floor discussed.  Counseling on above issues and coordination of care more than 50% for 15 minutes.  Princess Bruins MD, 2:18 PM 08/21/2017

## 2017-08-22 ENCOUNTER — Other Ambulatory Visit: Payer: Self-pay | Admitting: Obstetrics & Gynecology

## 2017-08-22 ENCOUNTER — Encounter: Payer: Self-pay | Admitting: Obstetrics & Gynecology

## 2017-08-22 ENCOUNTER — Ambulatory Visit (INDEPENDENT_AMBULATORY_CARE_PROVIDER_SITE_OTHER): Payer: Medicare HMO

## 2017-08-22 ENCOUNTER — Ambulatory Visit: Payer: Medicare HMO | Admitting: Obstetrics & Gynecology

## 2017-08-22 ENCOUNTER — Telehealth: Payer: Self-pay | Admitting: *Deleted

## 2017-08-22 DIAGNOSIS — R102 Pelvic and perineal pain unspecified side: Secondary | ICD-10-CM

## 2017-08-22 DIAGNOSIS — D252 Subserosal leiomyoma of uterus: Secondary | ICD-10-CM

## 2017-08-22 DIAGNOSIS — N63 Unspecified lump in unspecified breast: Secondary | ICD-10-CM

## 2017-08-22 DIAGNOSIS — N858 Other specified noninflammatory disorders of uterus: Secondary | ICD-10-CM

## 2017-08-22 DIAGNOSIS — R9389 Abnormal findings on diagnostic imaging of other specified body structures: Secondary | ICD-10-CM | POA: Diagnosis not present

## 2017-08-22 DIAGNOSIS — N852 Hypertrophy of uterus: Secondary | ICD-10-CM

## 2017-08-22 DIAGNOSIS — D251 Intramural leiomyoma of uterus: Secondary | ICD-10-CM

## 2017-08-22 DIAGNOSIS — N859 Noninflammatory disorder of uterus, unspecified: Secondary | ICD-10-CM | POA: Diagnosis not present

## 2017-08-22 DIAGNOSIS — N9489 Other specified conditions associated with female genital organs and menstrual cycle: Secondary | ICD-10-CM

## 2017-08-22 LAB — PAP IG W/ RFLX HPV ASCU

## 2017-08-22 NOTE — Telephone Encounter (Signed)
patient called back and order faxed to [phone], they will call to schedule.

## 2017-08-22 NOTE — Patient Instructions (Signed)
1. Pelvic pain in female Intermittent midline pelvic pain worsened by movement.  2. Thickened endometrium Thick endometrium at 10.4 cm with a cystic focus 1.8 x 1.0 cm and color flow doppler positive.  Long standing menopause on no hormone replacement therapy, no postmenopausal bleeding.  Will proceed with Sonohysterogram with endometrial biopsy per findings, to rule out endometrial polyp, submucosal fibroid, endometrial hyperplasia and endometrial cancer. - [place] Sonohysterogram; Future  3. Uterine mass Benign Uterine Fibroid very likely.  But given that patient is experiencing pain, has no recent pelvic imaging with no h/o Uterine Fibroids and that the uterine mass is large at 6 x 5.7 x 5.7 cm, decision made to further investigate with an MRI of the pelvis.  MRI of the pelvis to make sure that the uterine mass has no feature c/w a Sarcoma.  Richell, good seeing you today!

## 2017-08-22 NOTE — Progress Notes (Signed)
    Rebecca Long 1950-06-18 [id number]        67 y.o.  G2P0011   RP: Pelvic Pain for Pelvic US  HPI: Intermittent midline pelvic pain x 3 days, started during a [employer] and worsened after.  No PMB.     OB History  Gravida Para Term Preterm AB Living  2 1     1 1   SAB TAB Ectopic Multiple Live Births      1        # Outcome Date GA Lbr Len/2nd Weight Sex Delivery Anes PTL Lv  2 Ectopic           1 Para             Past medical history,surgical history, problem list, medications, allergies, family history and social history were all reviewed and documented in the EPIC chart.   Directed ROS with pertinent positives and negatives documented in the history of present illness/assessment and plan.  Exam:  There were no vitals filed for this visit. General appearance:  Normal  Pelvic US today: T/V and T/a images.  Anteverted uterus measuring 5.89 x 4.16 x 2.09 cm. Exophytic right fundal fibroid with feeder vessel measured at 6.0 x 5.7 x 5.7 cm.  Endometrial lining thickened at 10.4 mm.  With a cystic focus measuring 1.8 x 1.0 cm.  Color flow Doppler positive to the endometrium.  Right ovary normal.  Left ovary with an echo-free cyst measuring 7 x 10 mm.  No free fluid in the posterior cul-de-sac.   Assessment/Plan:  67 y.o. G2P0011   1. Pelvic pain in female Intermittent midline pelvic pain worsened by movement.  2. Thickened endometrium Thick endometrium at 10.4 cm with a cystic focus 1.8 x 1.0 cm and color flow doppler positive.  Long standing menopause on no hormone replacement therapy, no postmenopausal bleeding.  Will proceed with Sonohysterogram with endometrial biopsy per findings, to rule out endometrial polyp, submucosal fibroid, endometrial hyperplasia and endometrial cancer. - [place] Sonohysterogram; Future  3. Uterine mass Benign Uterine Fibroid very likely.  But given that patient is experiencing pain, has no recent pelvic imaging with no h/o Uterine Fibroids  and that the uterine mass is large at 6 x 5.7 x 5.7 cm, decision made to further investigate with an MRI of the pelvis.  MRI of the pelvis to make sure that the uterine mass has no feature c/w a Sarcoma.  Counseling on above issues and coordination of care more than 50% for 15 minutes.  Princess Bruins MD, 4:26 PM 08/22/2017

## 2017-08-22 NOTE — Telephone Encounter (Signed)
Left message for pt to call to find out where last mammogram was done.

## 2017-08-22 NOTE — Telephone Encounter (Signed)
patient scheduled on 09/04/17 @ 12:30pm at breast center. After speaking with patient she would prefer to have done at [place] will let me know what they need from me. She will call breast center to cancel appointment.

## 2017-08-22 NOTE — Telephone Encounter (Signed)
-----   Message from Princess Bruins, MD sent at 08/21/2017  2:57 PM EDT ----- Regarding: Refer for Left Dx mammo/US Left breast density/mass at 4 O'clock 4 x 4 cm, mobile, mildly tender.  S/P Bilateral breast reduction.

## 2017-08-23 ENCOUNTER — Telehealth: Payer: Self-pay | Admitting: *Deleted

## 2017-08-23 DIAGNOSIS — R102 Pelvic and perineal pain: Secondary | ICD-10-CM

## 2017-08-23 DIAGNOSIS — N858 Other specified noninflammatory disorders of uterus: Secondary | ICD-10-CM

## 2017-08-23 NOTE — Telephone Encounter (Signed)
-----   Message from Princess Bruins, MD sent at 08/22/2017  4:45 PM EDT ----- Regarding: Schedule MRI of pelvis Pelvic pain with large uterine mass 6 x 5.7 x 5.7 cm.

## 2017-08-23 NOTE — Telephone Encounter (Signed)
Number given to patient to schedule at [facility].

## 2017-08-24 LAB — URINALYSIS, COMPLETE W/RFL CULTURE
Bacteria, UA: NONE SEEN /HPF
Bilirubin Urine: NEGATIVE
Glucose, UA: NEGATIVE
Hyaline Cast: NONE SEEN /LPF
Ketones, ur: NEGATIVE
Leukocyte Esterase: NEGATIVE
Nitrites, Initial: NEGATIVE
Protein, ur: NEGATIVE
Specific Gravity, Urine: 1.01 (ref 1.001–1.03)
pH: 5.5 (ref 5.0–8.0)

## 2017-08-24 LAB — URINE CULTURE
MICRO NUMBER:: 90880947
SPECIMEN QUALITY:: ADEQUATE
Sample Source: 0

## 2017-08-24 LAB — CULTURE INDICATED

## 2017-08-25 ENCOUNTER — Encounter: Payer: Self-pay | Admitting: Obstetrics & Gynecology

## 2017-08-25 NOTE — Patient Instructions (Signed)
1. Encounter for routine gynecological examination with Papanicolaou smear of cervix Normal gynecologic exam in menopause, except for mild tenderness of the uterus.  Pap reflex done today.  Breast exam normal.  Mammogram negative in April 2019.  Colonoscopy in 2015.  2. Menopause present Menopause, well on no HRT.  No postmenopausal bleeding.  Last bone density showed osteopenia with a T score of -1.8 in October 2017.  Will repeat in October 2019.  Vitamin D supplements, calcium rich nutrition and regular physical activity recommended.  3. Left breast mass  Left breast:  Increased density/mass at 4 O'clock, 4 x 4 cm, mobile, mildly tender. Lt Dx mammo/US  4. Pelvic pain in female Increase with ambulation.  Patient will complete investigation with a pelvic ultrasound.  Urine analysis very mildly perturbed, culture pending. - US Transvaginal Non-OB; Future - Urinalysis,Complete w/RFL Culture  5. SUI (stress urinary incontinence, female) Kegel exercises instructed and recommended.  PT for reinforcement of pelvic floor discussed.  Rebecca Long, it was a pleasure meeting you today!  I will inform you of your results as soon as they are available.   Kegel Exercises Kegel exercises help strengthen the muscles that support the rectum, vagina, small intestine, bladder, and uterus. Doing Kegel exercises can help:  Improve bladder and bowel control.  Improve sexual response.  Reduce problems and discomfort during pregnancy.  Kegel exercises involve squeezing your pelvic floor muscles, which are the same muscles you squeeze when you try to stop the flow of urine. The exercises can be done while sitting, standing, or lying down, but it is best to vary your position. Phase 1 exercises 1. Squeeze your pelvic floor muscles tight. You should feel a tight lift in your rectal area. If you are a female, you should also feel a tightness in your vaginal area. Keep your stomach, buttocks, and legs relaxed. 2. Hold  the muscles tight for up to 10 seconds. 3. Relax your muscles. Repeat this exercise 50 times a day or as many times as told by your health care provider. Continue to do this exercise for at least 4-6 weeks or for as long as told by your health care provider. This information is not intended to replace advice given to you by your health care provider. Make sure you discuss any questions you have with your health care provider. Document Released: 01/02/2012 Document Revised: 09/10/2015 Document Reviewed: 12/05/2014 Elsevier Interactive Patient Education  [employer].

## 2017-08-30 NOTE — Telephone Encounter (Signed)
patient scheduled on 09/09/17 @ 1:40pm

## 2017-09-04 ENCOUNTER — Other Ambulatory Visit (HOSPITAL_COMMUNITY): Payer: Medicare HMO

## 2017-09-04 ENCOUNTER — Other Ambulatory Visit: Payer: Medicare HMO

## 2017-09-09 ENCOUNTER — Ambulatory Visit
Admission: RE | Admit: 2017-09-09 | Discharge: 2017-09-09 | Disposition: A | Discharge status: Home / Self Care | Payer: Medicare HMO | Source: Ambulatory Visit | Attending: Obstetrics & Gynecology | Admitting: Obstetrics & Gynecology

## 2017-09-09 DIAGNOSIS — R102 Pelvic and perineal pain: Secondary | ICD-10-CM

## 2017-09-09 DIAGNOSIS — D252 Subserosal leiomyoma of uterus: Secondary | ICD-10-CM | POA: Diagnosis not present

## 2017-09-09 DIAGNOSIS — N858 Other specified noninflammatory disorders of uterus: Secondary | ICD-10-CM

## 2017-09-09 MED ORDER — GADOBENATE DIMEGLUMINE 529 MG/ML IV SOLN
15.0000 mL | Freq: Once | INTRAVENOUS | Status: AC | PRN
  Administered 2017-09-09: 15 mL via INTRAVENOUS

## 2017-09-10 ENCOUNTER — Encounter: Payer: Self-pay | Admitting: Obstetrics & Gynecology

## 2017-09-10 DIAGNOSIS — R928 Other abnormal and inconclusive findings on diagnostic imaging of breast: Secondary | ICD-10-CM | POA: Diagnosis not present

## 2017-09-10 DIAGNOSIS — N6489 Other specified disorders of breast: Secondary | ICD-10-CM | POA: Diagnosis not present

## 2017-09-11 ENCOUNTER — Other Ambulatory Visit: Payer: Self-pay | Admitting: Obstetrics & Gynecology

## 2017-09-11 ENCOUNTER — Telehealth: Payer: Self-pay

## 2017-09-11 DIAGNOSIS — D219 Benign neoplasm of connective and other soft tissue, unspecified: Secondary | ICD-10-CM

## 2017-09-11 NOTE — Telephone Encounter (Signed)
-----   Message from Princess Bruins, MD sent at 09/10/2017  5:35 PM EDT ----- MRI pelvis reviewed:  Normal Ovaries, no Free Fluid.  SubSerosal Fibroid 5.8 cm.  Reassure patient.  F/U with me if pelvic pain persists.  Recommend following the Fibroid with a Pelvic US in 6 months here.

## 2017-09-11 NOTE — Telephone Encounter (Signed)
Patient asked in light of these results did she need to come for the [facility] appointment scheduled next week?   You had ordered it due to thickened endometrium.

## 2017-09-12 NOTE — Telephone Encounter (Signed)
MRI also shows a thin endometrium, so can cancel the Sonohysterogram.

## 2017-09-13 NOTE — Telephone Encounter (Signed)
Patient informed, [facility] canceled.

## 2017-09-16 ENCOUNTER — Encounter: Payer: Self-pay | Admitting: Anesthesiology

## 2017-09-19 ENCOUNTER — Other Ambulatory Visit: Payer: Medicare HMO

## 2017-09-19 ENCOUNTER — Ambulatory Visit: Payer: Medicare HMO | Admitting: Obstetrics & Gynecology

## 2017-10-14 ENCOUNTER — Other Ambulatory Visit: Payer: Self-pay | Admitting: *Deleted

## 2017-10-14 DIAGNOSIS — I10 Essential (primary) hypertension: Secondary | ICD-10-CM

## 2017-10-14 DIAGNOSIS — E785 Hyperlipidemia, unspecified: Secondary | ICD-10-CM

## 2017-10-14 MED ORDER — ATORVASTATIN CALCIUM 10 MG PO TABS
10.0000 mg | ORAL_TABLET | Freq: Every day | ORAL | 0 refills | Status: DC
Start: 2017-10-14 — End: 2017-11-01

## 2017-10-18 DIAGNOSIS — R69 Illness, unspecified: Secondary | ICD-10-CM | POA: Diagnosis not present

## 2017-10-24 DIAGNOSIS — F3341 Major depressive disorder, recurrent, in partial remission: Secondary | ICD-10-CM | POA: Diagnosis not present

## 2017-10-24 DIAGNOSIS — R69 Illness, unspecified: Secondary | ICD-10-CM | POA: Diagnosis not present

## 2017-11-01 ENCOUNTER — Encounter: Payer: Self-pay | Admitting: Gastroenterology

## 2017-11-01 ENCOUNTER — Ambulatory Visit (INDEPENDENT_AMBULATORY_CARE_PROVIDER_SITE_OTHER): Payer: Medicare HMO | Admitting: Family Medicine

## 2017-11-01 ENCOUNTER — Encounter: Payer: Self-pay | Admitting: Family Medicine

## 2017-11-01 VITALS — BP 124/78 | HR 83 | Temp 98.2°F | Resp 20 | Ht 63.0 in | Wt 166.5 lb

## 2017-11-01 DIAGNOSIS — I10 Essential (primary) hypertension: Secondary | ICD-10-CM

## 2017-11-01 DIAGNOSIS — R1310 Dysphagia, unspecified: Secondary | ICD-10-CM | POA: Diagnosis not present

## 2017-11-01 DIAGNOSIS — R1319 Other dysphagia: Secondary | ICD-10-CM

## 2017-11-01 DIAGNOSIS — E785 Hyperlipidemia, unspecified: Secondary | ICD-10-CM

## 2017-11-01 MED ORDER — LOSARTAN POTASSIUM 100 MG PO TABS: 100.0000 mg | ORAL_TABLET | Freq: Every day | ORAL | 1 refills | Status: DC

## 2017-11-01 MED ORDER — ATORVASTATIN CALCIUM 10 MG PO TABS: 10.0000 mg | ORAL_TABLET | Freq: Every day | ORAL | 3 refills | Status: DC

## 2017-11-01 NOTE — Progress Notes (Signed)
Rebecca Long , 09-22-1950, 67 y.o., female MRN: [id number] Patient Care Team    Relationship Specialty Notifications Start End  Rebecca Hillock, DO PCP - General Family Medicine  10/06/14   Rebecca Long, [place] Consulting Physician Podiatry  10/18/15   Rebecca Levans, MD Consulting Physician Dermatology  10/18/15   Rebecca Libel, MD Consulting Physician Sports Medicine  11/01/15     Chief Complaint  Patient presents with  . Dysphagia     Subjective: Pt presents for an OV with new complaints of dysphagia of 1 year duration.  Associated symptoms include feeling like food is stuck in her throat, she points to just above the sternal notch midline.  She states it has happened about 10 times over the last year, however it is becoming more frequent with 3 of those times being very recent.  It is not uncommon for her to vomit the food contents of her stomach when this occurs.  She reports she does not have any difficulty swallowing liquids.  She has not noticed any particular food type that causes her symptoms.  She has had no unintentional weight loss.  Her gastroenterologist is Dr. Fuller Plan.  Hypertension/hyperlipidemia/overweight:   Pt reports compliance with Losartan 100 mg daily, Lipitor 10 mg daily and daily baby aspirin. Blood pressures ranges at home not routinely checked. Patient denies chest pain, shortness of breath, dizziness or lower extremity edema.  Pt endorses use of daily baby ASA. Pt is  prescribed statin. BMP: 05/15/2017 within normal limits CBC: 05/15/2017 within normal limits Lipid:05/15/2017 within normal limits, total cholesterol 162, HDL 63, LDL 79, triglycerides 26 TSH: 05/15/2017 within normal limits Exercise: exercise and pickle ball.  RF: HTN, HLD, Fhx Heart disease   Depression screen [facility] 2/9 05/15/2017 04/09/2017 11/12/2016 11/12/2016 10/18/2015  Decreased Interest 1 1 0 0 0  Down, Depressed, Hopeless 1 1 0 0 0  PHQ - 2 Score 2 2 0 0 0  Altered sleeping 1 1 0 - -    Tired, decreased energy 2 3 3  - -  Change in appetite 2 1 0 - -  Feeling bad or failure about yourself  1 1 0 - -  Trouble concentrating 1 1 2  - -  Moving slowly or fidgety/restless 0 1 0 - -  Suicidal thoughts 0 0 0 - -  PHQ-9 Score 9 10 5  - -  Difficult doing work/chores Not difficult at all Not difficult at all - - -    Allergies  Allergen Reactions  . Latex Itching and Rash  . Polysporin [Bacitracin-Polymyxin B] Rash   Social History   Tobacco Use  . Smoking status: Former Smoker    Last attempt to quit: 01/30/1991    Years since quitting: 26.7  . Smokeless tobacco: Never Used  Substance Use Topics  . Alcohol use: Yes    Comment: 1/year   Past Medical History:  Diagnosis Date  . Achilles tendinitis   . Anxiety   . Blood transfusion without reported diagnosis 1980   ectopic pregnancy  . Colon polyps    Benign, routine colonoscopy found  . Depression   . Hyperlipidemia   . Hypertension   . Insomnia   . Nerve pain    back  . Osteopenia   . Scoliosis   . Spinal stenosis    Past Surgical History:  Procedure Laterality Date  . BREAST REDUCTION SURGERY Bilateral 2000  . BREAST SURGERY     breast reduction  . CYST EXCISION  1985   head and rt axilla  . ECTOPIC PREGNANCY SURGERY  1981  . frozen shoulder Right 2007  . PARATHYROIDECTOMY  2008   Family History  Problem Relation Age of Onset  . Heart disease Father 81  . Breast cancer Mother   . Hypertension Brother   . Hypertension Sister   . Breast cancer Maternal Aunt   . Colon cancer Neg Hx    Allergies as of 11/01/2017      Reactions   Latex Itching, Rash   Polysporin [bacitracin-polymyxin B] Rash      Medication List        Accurate as of 11/01/17  3:31 PM. Always use your most recent med list.          atorvastatin 10 MG tablet Commonly known as:  LIPITOR Take 1 tablet (10 mg total) by mouth daily. Needs office visit prior to any additional refills.   FLUoxetine 40 MG capsule Commonly  known as:  PROZAC Take 40 mg by mouth daily.   losartan 100 MG tablet Commonly known as:  COZAAR Take 1 tablet (100 mg total) by mouth daily.   zolpidem 5 MG tablet Commonly known as:  AMBIEN Take 5 mg by mouth at bedtime as needed.       All past medical history, surgical history, allergies, family history, immunizations andmedications were updated in the EMR today and reviewed under the history and medication portions of their EMR.     ROS: Negative, with the exception of above mentioned in HPI   Objective:  BP 124/78 (BP Location: Left Arm, Patient Position: Sitting, Cuff Size: Large)   Pulse 83   Temp 98.2 F (36.8 C)   Resp 20   Ht 5\' 3"  (1.6 m)   Wt 166 lb 8 oz (75.5 kg)   SpO2 97%   BMI 29.49 kg/m  Body mass index is 29.49 kg/m. Gen: Afebrile. No acute distress. Nontoxic in appearance, well developed, well nourished.  HENT: AT. [place].  MMM, no oral lesions.  Eyes:Pupils Equal Round Reactive to light, Extraocular movements intact,  Conjunctiva without redness, discharge or icterus. Neck/lymp/endocrine: Supple, no lymphadenopathy, no thyromegaly CV: RRR no murmur, no edema Chest: CTAB, no wheeze or crackles. Good air movement, normal resp effort.  Abd: Soft. NTND. BS +. no Masses palpated. No rebound or guarding. Skin: no rashes, purpura or petechiae.  Neuro:  Normal gait. PERLA. EOMi. Alert. Oriented x3  Psych: Normal affect, dress and demeanor. Normal speech. Normal thought content and judgment.  No exam data present No results found. No results found for this or any previous visit (from the past 24 hour(s)).  Assessment/Plan: Rebecca Long is a 67 y.o. female present for OV for  Esophageal dysphagia New problem.  Has been occurring for over 1 year with worsening symptoms over the last few weeks.  Gust options with her today which included swallow study versus GI referral for evaluation and possible EGD (vs swallow study).  Patient is interested in GI referral  today. - Ambulatory referral to Gastroenterology, she is a patient of Dr. Silvio Pate. Follow-up PRN  Essential hypertension, benign/Hyperlipidemia, unspecified hyperlipidemia type/overweight -  Stable.  Feels provided today on losartan and Lipitor. - Continue exercising at least 150 minutes a week. Low-sodium diet. - atorvastatin (LIPITOR) 10 MG tablet; Take 1 tablet (10 mg total) by mouth daily.  Dispense: 90 tablet; Refill: 0 - losartan (COZAAR) 100 MG tablet; Take 1 tablet (100 mg total) by mouth daily.  Dispense: 90  tablet; Refill: 1 - atorvastatin (LIPITOR) 10 MG tablet; Take 1 tablet (10 mg total) by mouth daily.  Dispense: 90 tablet; Refill: 0 - F/U 6 months as long as doing well.   Reviewed expectations re: course of current medical issues.  Discussed self-management of symptoms.  Outlined signs and symptoms indicating need for more acute intervention.  Patient verbalized understanding and all questions were answered.  Patient received an After-Visit Summary.    No orders of the defined types were placed in this encounter.  > 25 minutes spent with patient, >50% of time spent face to face counseling and coordinating care.     Note is dictated utilizing voice recognition software. Although note has been proof read prior to signing, occasional typographical errors still can be missed. If any questions arise, please do not hesitate to call for verification.   electronically signed by:  Howard Pouch, DO  [place]

## 2017-11-01 NOTE — Patient Instructions (Signed)
Referred you to gastroenterology to have evaluation and possibly egd procedure.    Dysphagia Dysphagia is trouble swallowing. This condition occurs when solids and liquids stick in a person's throat on the way down to the stomach, or when food takes longer to get to the stomach. You may have problems swallowing food, liquids, or both. You may also have pain while trying to swallow. It may take you more time and effort to swallow something. What are the causes? This condition is caused by:  Problems with the muscles. They may make it difficult for you to move food and liquids through the tube that connects your mouth to your stomach (esophagus). You may have ulcers, scar tissue, or inflammation that blocks the normal passage of food and liquids. Causes of these problems include: ? Acid reflux from your stomach into your esophagus (gastroesophageal reflux). ? Infections. ? Radiation treatment for cancer. ? Medicines taken without enough fluids to wash them down into your stomach.  Nerve problems. These prevent signals from being sent to the muscles of your esophagus to squeeze (contract) and move what you swallow down to your stomach.  Globus pharyngeus. This is a common problem that involves feeling like something is stuck in the throat or a sense of trouble with swallowing even though nothing is wrong with the swallowing passages.  Stroke. This can affect the nerves and make it difficult to swallow.  Certain conditions, such as cerebral palsy or Parkinson disease.  What are the signs or symptoms? Common symptoms of this condition include:  A feeling that solids or liquids are stuck in your throat on the way down to the stomach.  Food taking too long to get to the stomach.  Other symptoms include:  Food moving back from your stomach to your mouth (regurgitation).  Noises coming from your throat.  Chest discomfort with swallowing.  A feeling of fullness when  swallowing.  Drooling, especially when the throat is blocked.  Pain while swallowing.  Heartburn.  Coughing or gagging while trying to swallow.  How is this diagnosed? This condition is diagnosed by:  Barium X-ray. In this test, you swallow a white substance (contrast medium)that sticks to the inside of your esophagus. X-ray images are then taken.  Endoscopy. In this test, a flexible telescope is inserted down your throat to look at your esophagus and your stomach.  CT scans and MRI.  How is this treated? Treatment for dysphagia depends on the cause of the condition:  If the dysphagia is caused by acid reflux or infection, medicines may be used. They may include antibiotics and heartburn medicines.  If the dysphagia is caused by problems with your muscles, swallowing therapy may be used to help you strengthen your swallowing muscles. You may have to do specific exercises to strengthen the muscles or stretch them.  If the dysphagia is caused by a blockage or mass, procedures to remove the blockage may be done. You may need surgery and a feeding tube.  You may need to make diet changes. Ask your health care provider for specific instructions. Follow these instructions at home: Eating and drinking  Try to eat soft food that is easier to swallow.  Follow any diet changes as told by your health care provider.  Cut your food into small pieces and eat slowly.  Eat and drink only when you are sitting upright.  Do not drink alcohol or caffeine. If you need help quitting, ask your health care provider. General instructions  Check your  weight every day to make sure you are not losing weight.  Take over-the-counter and prescription medicines only as told by your health care provider.  If you were prescribed an antibiotic medicine, take it as told by your health care provider. Do not stop taking the antibiotic even if you start to feel better.  Do not use any products that contain  nicotine or tobacco, such as cigarettes and e-cigarettes. If you need help quitting, ask your health care provider.  Keep all follow-up visits as told by your health care provider. This is important. Contact a health care provider if:  You lose weight because you cannot swallow.  You cough when you drink liquids (aspiration).  You cough up partially digested food. Get help right away if:  You cannot swallow your saliva.  You have shortness of breath or a fever, or both.  You have a hoarse voice and also have trouble swallowing. Summary  Dysphagia is trouble swallowing. This condition occurs when solids and liquids stick in a person's throat on the way down to the stomach, or when food takes longer to get to the stomach.  Dysphagia has many possible causes and symptoms.  Treatment for dysphagia depends on the cause of the condition. This information is not intended to replace advice given to you by your health care provider. Make sure you discuss any questions you have with your health care provider. Document Released: 01/13/2000 Document Revised: 01/05/2016 Document Reviewed: 01/05/2016 Elsevier Interactive Patient Education  2017 [employer].

## 2017-11-29 DIAGNOSIS — Z8719 Personal history of other diseases of the digestive system: Secondary | ICD-10-CM

## 2017-11-29 HISTORY — DX: Personal history of other diseases of the digestive system: Z87.19

## 2017-12-05 ENCOUNTER — Ambulatory Visit: Payer: Medicare HMO | Admitting: Physician Assistant

## 2017-12-05 ENCOUNTER — Encounter: Payer: Self-pay | Admitting: Physician Assistant

## 2017-12-05 ENCOUNTER — Encounter (INDEPENDENT_AMBULATORY_CARE_PROVIDER_SITE_OTHER): Payer: Medicare HMO | Admitting: Gastroenterology

## 2017-12-05 ENCOUNTER — Encounter: Payer: Self-pay | Admitting: Gastroenterology

## 2017-12-05 VITALS — BP 110/78 | HR 72 | Ht 62.0 in | Wt 166.0 lb

## 2017-12-05 DIAGNOSIS — Z86010 Personal history of colonic polyps: Secondary | ICD-10-CM

## 2017-12-05 DIAGNOSIS — R1314 Dysphagia, pharyngoesophageal phase: Secondary | ICD-10-CM

## 2017-12-05 NOTE — Progress Notes (Signed)
Reviewed and agree with management plan.  Florena Kozma T. Daviyon Widmayer, MD FACG 

## 2017-12-05 NOTE — Patient Instructions (Signed)
You have been scheduled for a Barium Esophogram at [facility]iology (1st floor of the hospital) on Monday the 11th at 9:30 am. Please arrive at 9;15 am  to your appointment for registration. Make certain not to have anything to eat or drink 3 hours prior to your test. If you need to reschedule for any reason, please contact radiology at [phone] to do so. __________________________________________________________________ A barium swallow is an examination that concentrates on views of the esophagus. This tends to be a double contrast exam (barium and two liquids which, when combined, create a gas to distend the wall of the oesophagus) or single contrast (non-ionic iodine based). The study is usually tailored to your symptoms so a good history is essential. Attention is paid during the study to the form, structure and configuration of the esophagus, looking for functional disorders (such as aspiration, dysphagia, achalasia, motility and reflux) EXAMINATION You may be asked to change into a gown, depending on the type of swallow being performed. A radiologist and radiographer will perform the procedure. The radiologist will advise you of the type of contrast selected for your procedure and direct you during the exam. You will be asked to stand, sit or lie in several different positions and to hold a small amount of fluid in your mouth before being asked to swallow while the imaging is performed .In some instances you may be asked to swallow barium coated marshmallows to assess the motility of a solid food bolus. The exam can be recorded as a digital or video fluoroscopy procedure. POST PROCEDURE It will take 1-2 days for the barium to pass through your system. To facilitate this, it is important, unless otherwise directed, to increase your fluids for the next 24-48hrs and to resume your normal diet.  This test typically takes about 30 minutes to  perform. __________________________________________________________________________________  Normal BMI (Body Mass Index- based on height and weight) is between 23 and 30. Your BMI today is Body mass index is 30.36 kg/m. Marland Kitchen Please consider follow up  regarding your BMI with your Primary Care Provider.

## 2017-12-05 NOTE — Progress Notes (Signed)
Chief Complaint: Dysphagia  HPI:    Rebecca Long is a 67 year old female with a past medical history as listed below, known to Dr. Fuller Plan from a surveillance colonoscopy, who was referred to me by Howard Pouch A, DO for a complaint of dysphagia.      08/28/2013 colonoscopy due to patient's history of adenomatous colon polyps with mild diverticulosis in the sigmoid colon and otherwise normal.  Repeat was recommended in 5 years.    11/01/2017 office visit with PCP to complain about dysphagia that had been going on for a year.  Food seemed to get stuck in her throat, apparently had happened 10 times over the past year but more frequent recently.    Today, the patient presents to clinic accompanied by her husband and explains that over the past year she has had more than 5 but less than 10 episodes, where she feels as though something gets stuck in her throat and causes her to cough, on occasion, it has even caused her to vomit.  This has been increasing in frequency over the past few months.  Patient notes that she is trying to eat slower and chew very well in order to avoid problems.  Does express that when she eats, she coughs quite frequently though, and this is "a dry cough".  Asssociated symptoms include a very minor amount of reflux per patient which happens "not even once every week".  Patient is on Mobic as needed, but does not use this every day either.    Denies fever, chills, weight loss, anorexia, weight loss or change in bowel habits.  Past Medical History:  Diagnosis Date  . Achilles tendinitis   . Anxiety   . Blood transfusion without reported diagnosis 1980   ectopic pregnancy  . Depression   . Hyperlipidemia   . Hypertension   . Insomnia   . Nerve pain    back  . Osteopenia   . Scoliosis   . Spinal stenosis   . Tubular adenoma of colon 12/2001    Past Surgical History:  Procedure Laterality Date  . BREAST REDUCTION SURGERY Bilateral 2000  . CYST EXCISION  1985   head and  rt axilla  . ECTOPIC PREGNANCY SURGERY  1981  . frozen shoulder Right 2007  . PARATHYROIDECTOMY  2008    Current Outpatient Medications  Medication Sig Dispense Refill  . atorvastatin (LIPITOR) 10 MG tablet Take 1 tablet (10 mg total) by mouth daily. Needs office visit prior to any additional refills. 90 tablet 3  . buPROPion (WELLBUTRIN XL) 300 MG 24 hr tablet Take 1 tablet by mouth daily.    Marland Kitchen losartan (COZAAR) 100 MG tablet Take 1 tablet (100 mg total) by mouth daily. 90 tablet 1  . meloxicam (MOBIC) 7.5 MG tablet Take 7.5 mg by mouth as needed.  0  . zolpidem (AMBIEN) 5 MG tablet Take 5 mg by mouth at bedtime as needed.     No current facility-administered medications for this visit.     Allergies as of 12/05/2017 - Review Complete 12/05/2017  Allergen Reaction Noted  . Latex Itching and Rash 08/14/2013  . Polysporin [bacitracin-polymyxin b] Rash 09/09/2012    Family History  Problem Relation Age of Onset  . Heart disease Father 49  . Breast cancer Mother   . Hypertension Brother   . Hypertension Sister   . Breast cancer Maternal Aunt   . Colon cancer Neg Hx     Social History   Socioeconomic History  .  Marital status: Married    Spouse name: Not on file  . Number of children: Not on file  . Years of education: Not on file  . Highest education level: Not on file  Occupational History  . Not on file  Social Needs  . Financial resource strain: Not on file  . Food insecurity:    Worry: Not on file    Inability: Not on file  . Transportation needs:    Medical: Not on file    Non-medical: Not on file  Tobacco Use  . Smoking status: Former Smoker    Last attempt to quit: 01/30/1991    Years since quitting: 26.8  . Smokeless tobacco: Never Used  Substance and Sexual Activity  . Alcohol use: Yes    Comment: 1/year  . Drug use: No  . Sexual activity: Not Currently    Comment: 1st intercourse- 17, partners- 55, married- 19 yrs   Lifestyle  . Physical activity:      Days per week: Not on file    Minutes per session: Not on file  . Stress: Not on file  Relationships  . Social connections:    Talks on phone: Not on file    Gets together: Not on file    Attends religious service: Not on file    Active member of club or organization: Not on file    Attends meetings of clubs or organizations: Not on file    Relationship status: Not on file  . Intimate partner violence:    Fear of current or ex partner: Not on file    Emotionally abused: Not on file    Physically abused: Not on file    Forced sexual activity: Not on file  Other Topics Concern  . Not on file  Social History Narrative   Lives with husband. Feels safe at home. 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 [employer]. Some college. Former smoker. Wears seat belt.     Review of Systems:    Constitutional: No weight loss, fever or chills Skin: No rash Cardiovascular: No chest pain Respiratory: No SOB  Gastrointestinal: See HPI and otherwise negative Genitourinary: No dysuria  Neurological: No headache, dizziness or syncope Musculoskeletal: No new muscle or joint pain Hematologic: No bleeding  Psychiatric: No history of depression or anxiety   Physical Exam:  Vital signs: BP 110/78   Pulse 72   Ht 5\' 2"  (1.575 m)   Wt 166 lb (75.3 kg)   BMI 30.36 kg/m   Constitutional:   Pleasant Caucasian female appears to be in NAD, Well developed, Well nourished, alert and cooperative Head:  Normocephalic and atraumatic. Eyes:   PEERL, EOMI. No icterus. Conjunctiva pink. Ears:  Normal auditory acuity. Neck:  Supple Throat: Oral cavity and pharynx without inflammation, swelling or lesion.  Respiratory: Respirations even and unlabored. Lungs clear to auscultation bilaterally.   No wheezes, crackles, or rhonchi.  Cardiovascular: Normal S1, S2. No MRG. Regular rate and rhythm. No peripheral edema, cyanosis or pallor.  Gastrointestinal:  Soft, nondistended, nontender. No rebound or guarding. Normal  bowel sounds. No appreciable masses or hepatomegaly. Rectal:  Not performed.  Msk:  Symmetrical without gross deformities. Without edema, no deformity or joint abnormality.  Neurologic:  Alert and  oriented x4;  grossly normal neurologically.  Skin:   Dry and intact without significant lesions or rashes. Psychiatric: Demonstrates good judgement and reason without abnormal affect or behaviors.  MOST RECENT LABS AND IMAGING: CBC    Component Value Date/Time  WBC 5.2 05/15/2017 0905   RBC 4.87 05/15/2017 0905   HGB 14.1 05/15/2017 0905   HCT 42.5 05/15/2017 0905   PLT 279.0 05/15/2017 0905   MCV 87.4 05/15/2017 0905   MCHC 33.2 05/15/2017 0905   RDW 14.4 05/15/2017 0905   LYMPHSABS 2.1 11/10/2015 0831   MONOABS 0.3 11/10/2015 0831   EOSABS 0.1 11/10/2015 0831   BASOSABS 0.0 11/10/2015 0831    CMP     Component Value Date/Time   NA 140 05/15/2017 0905   NA 139 04/29/2014   K 4.2 05/15/2017 0905   CL 106 05/15/2017 0905   CO2 25 05/15/2017 0905   GLUCOSE 88 05/15/2017 0905   BUN 11 05/15/2017 0905   BUN 15 04/29/2014   CREATININE 0.71 05/15/2017 0905   CREATININE 0.72 11/10/2015 0831   CALCIUM 9.3 05/15/2017 0905   PROT 6.4 05/15/2017 0905   ALBUMIN 4.1 05/15/2017 0905   AST 17 05/15/2017 0905   ALT 19 05/15/2017 0905   ALKPHOS 48 05/15/2017 0905   BILITOT 0.8 05/15/2017 0905   GFRNONAA 88 11/10/2015 0831   GFRAA >89 11/10/2015 0831    Assessment: 1.  Dysphagia: Over the past year, 10 episodes where food seems to get stuck in her throat, causes dry cough which sometimes causes vomiting, worse over the past few months, very infrequent reflux, does use Mobic occasionally; consider most likely esophageal stricture versus dysmotility versus other 2.  History of adenomatous polyps: Last colonoscopy 07/2013 with recommendations for repeat in 5 years  Plan: 1.  Scheduled patient for a barium esophagram with tablet.  Pending results we will schedule patient for an EGD with  dilation in the [place] if necessary.  Patient does agree to have this scheduled with a different physician as Dr. Fuller Plan does not have availability before the end of November and the patient is leaving town at the beginning of December for 4 months. 2.  Reviewed anti-dysphagia measures including taking small bites, drinking sips of water in between bites and the chin tuck technique. 3.  Offered the patient Omeprazole 20 mg daily, but she would like to wait on any medications because her symptoms are so "infrequent". 4.  Patient will follow in clinic per recommendations after imaging above.  Ellouise Newer, PA-C [facility] 12/05/2017, 10:12 AM  Cc: Howard Pouch A, DO

## 2017-12-06 DIAGNOSIS — L821 Other seborrheic keratosis: Secondary | ICD-10-CM | POA: Diagnosis not present

## 2017-12-06 DIAGNOSIS — L814 Other melanin hyperpigmentation: Secondary | ICD-10-CM | POA: Diagnosis not present

## 2017-12-06 DIAGNOSIS — Z85828 Personal history of other malignant neoplasm of skin: Secondary | ICD-10-CM | POA: Diagnosis not present

## 2017-12-06 DIAGNOSIS — D1801 Hemangioma of skin and subcutaneous tissue: Secondary | ICD-10-CM | POA: Diagnosis not present

## 2017-12-06 DIAGNOSIS — D2262 Melanocytic nevi of left upper limb, including shoulder: Secondary | ICD-10-CM | POA: Diagnosis not present

## 2017-12-06 DIAGNOSIS — D225 Melanocytic nevi of trunk: Secondary | ICD-10-CM | POA: Diagnosis not present

## 2017-12-09 ENCOUNTER — Ambulatory Visit (HOSPITAL_COMMUNITY)
Admission: RE | Admit: 2017-12-09 | Discharge: 2017-12-09 | Disposition: A | Discharge status: Home / Self Care | Payer: Medicare HMO | Source: Ambulatory Visit | Attending: Physician Assistant | Admitting: Physician Assistant

## 2017-12-09 DIAGNOSIS — K222 Esophageal obstruction: Secondary | ICD-10-CM | POA: Diagnosis not present

## 2017-12-09 DIAGNOSIS — K449 Diaphragmatic hernia without obstruction or gangrene: Secondary | ICD-10-CM | POA: Diagnosis not present

## 2017-12-09 DIAGNOSIS — R1314 Dysphagia, pharyngoesophageal phase: Secondary | ICD-10-CM | POA: Insufficient documentation

## 2017-12-09 DIAGNOSIS — R1310 Dysphagia, unspecified: Secondary | ICD-10-CM | POA: Diagnosis present

## 2017-12-10 ENCOUNTER — Telehealth: Payer: Self-pay

## 2017-12-10 NOTE — Telephone Encounter (Signed)
Levin Erp, PA  [place]al[place], Santez Woodcox L, RN        Esophagram shows stricture, needs EGD. She is a Teacher, adult education patient but he does not have availability before the end of November, please schedule her with available physician in the [facility] in next week or two . Thanks-JLL

## 2017-12-10 NOTE — Telephone Encounter (Signed)
The pt has been added to Dr Rush Landmark for 12/18/17 for EGD.  She is a Dr Fuller Plan pt but he does not have any open appts prior to the end of November.  She has been scheduled for pre op visit on 11/14 at 1 pm.  The pt has been advised of the information and verbalized understanding.

## 2017-12-12 ENCOUNTER — Encounter: Payer: Self-pay | Admitting: Gastroenterology

## 2017-12-12 ENCOUNTER — Ambulatory Visit (AMBULATORY_SURGERY_CENTER): Payer: Self-pay

## 2017-12-12 VITALS — Ht 62.5 in | Wt 165.2 lb

## 2017-12-12 DIAGNOSIS — K222 Esophageal obstruction: Secondary | ICD-10-CM

## 2017-12-12 NOTE — Progress Notes (Signed)
Denies allergies to eggs or soy products. Denies complication of anesthesia or sedation. Denies use of weight loss medication. Denies use of O2.   Emmi instructions declined.  

## 2017-12-18 ENCOUNTER — Encounter: Payer: Self-pay | Admitting: Gastroenterology

## 2017-12-18 ENCOUNTER — Ambulatory Visit (AMBULATORY_SURGERY_CENTER): Payer: Medicare HMO | Admitting: Gastroenterology

## 2017-12-18 ENCOUNTER — Telehealth: Payer: Self-pay | Admitting: Gastroenterology

## 2017-12-18 VITALS — BP 130/75 | HR 70 | Temp 97.7°F | Resp 14 | Ht 62.5 in | Wt 165.0 lb

## 2017-12-18 DIAGNOSIS — K222 Esophageal obstruction: Secondary | ICD-10-CM | POA: Diagnosis not present

## 2017-12-18 DIAGNOSIS — K449 Diaphragmatic hernia without obstruction or gangrene: Secondary | ICD-10-CM | POA: Diagnosis not present

## 2017-12-18 DIAGNOSIS — K3189 Other diseases of stomach and duodenum: Secondary | ICD-10-CM

## 2017-12-18 DIAGNOSIS — K317 Polyp of stomach and duodenum: Secondary | ICD-10-CM | POA: Diagnosis not present

## 2017-12-18 DIAGNOSIS — K259 Gastric ulcer, unspecified as acute or chronic, without hemorrhage or perforation: Secondary | ICD-10-CM | POA: Diagnosis not present

## 2017-12-18 DIAGNOSIS — R1310 Dysphagia, unspecified: Secondary | ICD-10-CM | POA: Diagnosis not present

## 2017-12-18 DIAGNOSIS — K228 Other specified diseases of esophagus: Secondary | ICD-10-CM | POA: Diagnosis not present

## 2017-12-18 MED ORDER — SODIUM CHLORIDE 0.9 % IV SOLN: 500.0000 mL | Freq: Once | INTRAVENOUS | Status: DC

## 2017-12-18 MED ORDER — OMEPRAZOLE 40 MG PO CPDR: 40.0000 mg | DELAYED_RELEASE_CAPSULE | Freq: Two times a day (BID) | ORAL | 4 refills | Status: DC

## 2017-12-18 NOTE — Progress Notes (Signed)
Called to room to assist during endoscopic procedure.  Patient ID and intended procedure confirmed with present staff. Received instructions for my participation in the procedure from the performing physician.  

## 2017-12-18 NOTE — Progress Notes (Signed)
Pt's states no medical or surgical changes since previsit or office visit. 

## 2017-12-18 NOTE — Op Note (Addendum)
[place] Patient Name: Rebecca Long Procedure Date: 12/18/2017 10:15 AM MRN: [id number] Endoscopist: Justice Britain , MD Age: 67 Referring MD:  Date of Birth: 04-Jan-1951 Gender: Female Account #: [id number] Procedure:                Upper GI endoscopy Indications:              Dysphagia, Chronic cough, Sore throat Medicines:                Monitored Anesthesia Care Procedure:                Pre-Anesthesia Assessment:                           - Prior to the procedure, a History and Physical                            was performed, and patient medications and                            allergies were reviewed. The patient's tolerance of                            previous anesthesia was also reviewed. The risks                            and benefits of the procedure and the sedation                            options and risks were discussed with the patient.                            All questions were answered, and informed consent                            was obtained. Prior Anticoagulants: The patient has                            taken previous NSAID medication. ASA Grade                            Assessment: II - A patient with mild systemic                            disease. After reviewing the risks and benefits,                            the patient was deemed in satisfactory condition to                            undergo the procedure.                           After obtaining informed consent, the endoscope was  passed under direct vision. Throughout the                            procedure, the patient's blood pressure, pulse, and                            oxygen saturations were monitored continuously. The                            Endoscope was introduced through the mouth, and                            advanced to the second part of duodenum. The upper                            GI endoscopy was accomplished without  difficulty.                            The patient tolerated the procedure. Scope In: Scope Out: Findings:                 No gross lesions were noted in the proximal                            esophagus, in the mid esophagus and in the distal                            esophagus. Biopsies were taken with a cold forceps                            for histology from the proximal and middle                            esophagus for EoE evaluation.. Biopsies were taken                            with a cold forceps for histology from the distal                            esophagus for EoE evaluation.                           LA Grade C (one or more mucosal breaks continuous                            between tops of 2 or more mucosal folds, less than                            75% circumference) esophagitis was found at the                            gastroesophageal junction at 31 cm. This was  biopsied with a cold forceps for histology purposes                            to rule out Barrett's esophagus as well.                           A widely patent and non-obstructing Schatzki ring                            was found at the gastroesophageal junction at 31                            cm. Biopsies were taken with a cold forceps for                            histology to disrupt the region, but as noted above                            there is significant inflammation at the [employer] with esophagitis noted - dilation not                            performed as widely patent.                           A medium-sized hiatal hernia was found. The                            proximal extent of the gastric folds (end of                            tubular esophagus) was 32 cm from the incisors. The                            hiatal narrowing was 35 cm from the incisors. The                            Z-line was 31 cm from the incisors.                            Multiple small semi-sessile polyps were found in                            the cardia, in the gastric fundus and in the                            gastric body. Four of these polyps were removed                            with a cold biopsy forceps for sampling purposes -  these have appearance of typical fundic gland                            polyps.                           Multiple dispersed small erosions were found in the                            gastric antrum. There were stigmata of recent                            bleeding.                           No other gross lesions were noted in the entire                            examined stomach. Biopsies were taken with a cold                            forceps for histology and Helicobacter pylori                            testing from the antrum/incisura/greater                            curve/lesser curve.                           No gross lesions were noted in the duodenal bulb,                            in the first portion of the duodenum and in the                            second portion of the duodenum. Complications:            No immediate complications. Estimated Blood Loss:     Estimated blood loss was minimal. Impression:               - No gross lesions in proximal/middle/distal                            esophagus. Biopsied for EoE.                           - LA Grade C esophagitis with a widely patent and                            non-obstructing Schatzki ring at the [employer].                            Biopsied.                           -  Medium-sized hiatal hernia.                           - Multiple gastric polyps - likely fundic gland                            polyps. Resected and retrieved for sampling                            purposes.                           - Recently bleeding erosive gastropathy. No other                            gross  lesions in the stomach. Biopsied for HP.                           - No gross lesions in the duodenal bulb, in the                            first portion of the duodenum and in the second                            portion of the duodenum. Recommendation:           - The patient will be observed post-procedure,                            until all discharge criteria are met.                           - Discharge patient to home.                           - Patient has a contact number available for                            emergencies. The signs and symptoms of potential                            delayed complications were discussed with the                            patient. Return to normal activities tomorrow.                            Written discharge instructions were provided to the                            patient.                           - Resume previous diet.                           -  Use Prilosec (omeprazole) 40 mg PO BID (30                            minutes before breakfast and 30 minutes before                            dinner).                           - Await pathology results.                           - Repeat upper endoscopy in 2-3 months to check                            healing and dependent on how patient is feeling                            consider need for further Schatzki ring disruption                            via biopsy vs dilation.                           - Minimize NSAID use as able.                           - The findings and recommendations were discussed                            with the patient.                           - The findings and recommendations were discussed                            with the patient's family. Justice Britain, MD 12/18/2017 10:57:14 AM

## 2017-12-18 NOTE — Progress Notes (Signed)
A and O x3. Report to RN. Tolerated MAC anesthesia well.Teeth unchanged after procedure.

## 2017-12-18 NOTE — Telephone Encounter (Signed)
Spoke with the pharmacist at [facility]. He reran the prescription and was able to get her balance down to $o.oo. Pharmacy will notify the patient.

## 2017-12-18 NOTE — Patient Instructions (Signed)
Start taking Prilosec 40 mg Twice daily 30 mins before breakfast and dinner.  Minimize NSAID use as able.  YOU HAD AN ENDOSCOPIC PROCEDURE TODAY AT [facility]:   Refer to the procedure report that was given to you for any specific questions about what was found during the examination.  If the procedure report does not answer your questions, please call your gastroenterologist to clarify.  If you requested that your care partner not be given the details of your procedure findings, then the procedure report has been included in a sealed envelope for you to review at your convenience later.  YOU SHOULD EXPECT: Some feelings of bloating in the abdomen. Passage of more gas than usual.  Walking can help get rid of the air that was put into your GI tract during the procedure and reduce the bloating. If you had a lower endoscopy (such as a colonoscopy or flexible sigmoidoscopy) you may notice spotting of blood in your stool or on the toilet paper. If you underwent a bowel prep for your procedure, you may not have a normal bowel movement for a few days.  Please Note:  You might notice some irritation and congestion in your nose or some drainage.  This is from the oxygen used during your procedure.  There is no need for concern and it should clear up in a day or so.  SYMPTOMS TO REPORT IMMEDIATELY:   Following upper endoscopy (EGD)  Vomiting of blood or coffee ground material  New chest pain or pain under the shoulder blades  Painful or persistently difficult swallowing  New shortness of breath  Fever of 100F or higher  Black, tarry-looking stools  For urgent or emergent issues, a gastroenterologist can be reached at any hour by calling [phone].   DIET:  We do recommend a small meal at first, but then you may proceed to your regular diet.  Drink plenty of fluids but you should avoid alcoholic beverages for 24 hours.  ACTIVITY:  You should plan to take it easy for the rest of  today and you should NOT DRIVE or use heavy machinery until tomorrow (because of the sedation medicines used during the test).    FOLLOW UP: Our staff will call the number listed on your records the next business day following your procedure to check on you and address any questions or concerns that you may have regarding the information given to you following your procedure. If we do not reach you, we will leave a message.  However, if you are feeling well and you are not experiencing any problems, there is no need to return our call.  We will assume that you have returned to your regular daily activities without incident.  If any biopsies were taken you will be contacted by phone or by letter within the next 1-3 weeks.  Please call us at [phone] if you have not heard about the biopsies in 3 weeks.    SIGNATURES/CONFIDENTIALITY: You and/or your care partner have signed paperwork which will be entered into your electronic medical record.  These signatures attest to the fact that that the information above on your After Visit Summary has been reviewed and is understood.  Full responsibility of the confidentiality of this discharge information lies with you and/or your care-partner.

## 2017-12-19 ENCOUNTER — Telehealth: Payer: Self-pay | Admitting: *Deleted

## 2017-12-19 NOTE — Telephone Encounter (Signed)
  Follow up Call-  Call back number 12/18/2017  Post procedure Call Back phone  # [phone]  Permission to leave phone message Yes  Some recent data might be hidden     Patient questions:  Do you have a fever, pain , or abdominal swelling? No. Pain Score  0 *  Have you tolerated food without any problems? Yes.    Have you been able to return to your normal activities? Yes.    Do you have any questions about your discharge instructions: Diet   No. Medications  No. Follow up visit  No.  Do you have questions or concerns about your Care? No.  Actions: * If pain score is 4 or above: No action needed, pain <4.

## 2017-12-23 DIAGNOSIS — R69 Illness, unspecified: Secondary | ICD-10-CM | POA: Diagnosis not present

## 2017-12-23 DIAGNOSIS — F419 Anxiety disorder, unspecified: Secondary | ICD-10-CM | POA: Diagnosis not present

## 2017-12-25 ENCOUNTER — Encounter: Payer: Self-pay | Admitting: Gastroenterology

## 2017-12-30 ENCOUNTER — Encounter: Payer: Self-pay | Admitting: Family Medicine

## 2017-12-30 DIAGNOSIS — K2100 Gastro-esophageal reflux disease with esophagitis, without bleeding: Secondary | ICD-10-CM | POA: Insufficient documentation

## 2018-01-06 ENCOUNTER — Telehealth: Payer: Self-pay | Admitting: Gastroenterology

## 2018-01-06 NOTE — Telephone Encounter (Signed)
Patient wants egd biopsy report

## 2018-01-06 NOTE — Telephone Encounter (Signed)
12/25/2017 MRN: [id number]   [place] [address]  Dear Rebecca Long,  I am writing to inform you that the biopsies taken during your recent endoscopic examination showed:   Diagnosis 1. Surgical [P], random gastric BX - BENIGN GASTRIC MUCOSA - NO H. PYLORI, INTESTINAL METAPLASIA OR MALIGNANCY IDENTIFIED 2. Surgical [P], gastric polyps BX - FUNDIC GLAND POLYP - NO H. PYLORI, INTESTINAL METAPLASIA OR MALIGNANCY IDENTIFIED 3. Surgical [P], GE junction ulcer BX - SQUAMOCOLUMNAR JUNCTION WITH ULCERATION, ACUTE AND CHRONIC INFLAMMATION - NO INTESTINAL METAPLASIA, DYSPLASIA OR MALIGNANCY IDENTIFIED - SEE COMMENT 4. Surgical [P], distal esophagus - REACTIVE SQUAMOUS MUCOSA WITH INCREASED INTRAEPITHELIAL LYMPHOCYTES - SEE COMMENT 5. Surgical [P], mid esophagus and proximal esophagus - REACTIVE SQUAMOUS MUCOSA WITH INCREASED INTRAEPITHELIAL LYMPHOCYTES - SEE COMMENT   What does this all mean? Your esophageal biopsies showed evidence of reactive changes consistent with reflux and not Eosinophilic esophagitis. The biopsies from the bottom of the esophagus showed evidence of inflammation and ulceration.  No evidence of Barrett's esophagus. The stomach polyps returned as fundic gland which are benign and not precancerous in nature as we expected. The stomach biopsies showed no evidence of H. Pylori infection.  This is good news.  As previously discussed, I would like you to continue on Omeprazole 40 mg twice daily for the next 8-months.   I would consider repeating your upper endoscopy in 63-months to evaluate for healing of the esophagus and of the stomach lining as well, but I will defer this to Dr. Fuller Plan.  You have an appointment for 02/04/2018 at 10:45 am with Dr Fuller Plan.   Please call us at Dept: [phone] if you have persistent problems or have questions about your condition that have not been fully answered at this time.  Sincerely,  Irving Copas., MD    I called and gave the pt the information in the letter and advised her that the letter was mailed.  She is to call with any further questions.

## 2018-01-08 ENCOUNTER — Telehealth: Payer: Self-pay | Admitting: Gastroenterology

## 2018-01-08 NOTE — Telephone Encounter (Signed)
I spoke with the pt and she was given the information in the letter and the letter was also re-mailed to her.

## 2018-01-14 ENCOUNTER — Encounter: Payer: Self-pay | Admitting: *Deleted

## 2018-02-04 ENCOUNTER — Ambulatory Visit: Payer: Medicare HMO | Admitting: Gastroenterology

## 2018-02-17 DIAGNOSIS — M25571 Pain in right ankle and joints of right foot: Secondary | ICD-10-CM | POA: Diagnosis not present

## 2018-03-11 ENCOUNTER — Other Ambulatory Visit: Payer: Self-pay | Admitting: Gastroenterology

## 2018-03-11 ENCOUNTER — Ambulatory Visit: Payer: Self-pay | Admitting: *Deleted

## 2018-03-11 NOTE — Telephone Encounter (Signed)
Summary: advise    Pt called and is in [place] and has swelling on inside of right ankle on the bone, no heat to touch. Pt went to urgent care two weeks ago in [place] and had xrays and they were not able to determine what was wrong. The urgent care gave the Pt medication for gout. Pt states that she doesnt think it really helped. Ankle is painful/ please advise what the Pt should do next     Patient has been in [place] for 2 months- she has been to [facility]- x ray negative.( 3 weeks ago-1/20)  Patient was diagnosed with gout- but did not have labs at [facility]. Patient is taking antiinflammatory. Indomethacin- 3 x d/5 d. Finished medication. Recommend patient go back for reevaluation of ankle- labs may be helpful. Patient states she will return to [facility].  Reason for Disposition . [1] Redness AND [2] painful when touched AND [3] no fever  Answer Assessment - Initial Assessment Questions 1. LOCATION: "Which joint is swollen?"     R ankle-inside 2. ONSET: "When did the swelling start?"     1 month ago 3. SIZE: "How large is the swelling?"     Has gone down some- red and discolored- local to one area of foot- swelling just on bone 4. PAIN: "Is there any pain?" If so, ask: "How bad is it?" (Scale 1-10; or mild, moderate, severe)     Yes- moderate patient has elevated, soaked- pain is described as aching pain- sore to touch 5. CAUSE: "What do you think caused the swollen joint?"     Unknown- no recall of injury 6. OTHER SYMPTOMS: "Do you have any other symptoms?" (e.g., fever, chest pain, difficulty breathing, calf pain)     No other symptoms 7. PREGNANCY: "Is there any chance you are pregnant?" "When was your last menstrual period?"     n/a  Protocols used: ANKLE SWELLING-A-AH

## 2018-03-12 DIAGNOSIS — M19072 Primary osteoarthritis, left ankle and foot: Secondary | ICD-10-CM | POA: Diagnosis not present

## 2018-03-12 DIAGNOSIS — M25572 Pain in left ankle and joints of left foot: Secondary | ICD-10-CM | POA: Diagnosis not present

## 2018-03-27 DIAGNOSIS — R509 Fever, unspecified: Secondary | ICD-10-CM | POA: Diagnosis not present

## 2018-03-27 DIAGNOSIS — B999 Unspecified infectious disease: Secondary | ICD-10-CM | POA: Diagnosis not present

## 2018-04-15 ENCOUNTER — Ambulatory Visit: Payer: Medicare Other

## 2018-05-02 ENCOUNTER — Ambulatory Visit: Payer: Medicare HMO

## 2018-05-19 ENCOUNTER — Other Ambulatory Visit: Payer: Medicare HMO

## 2018-05-19 ENCOUNTER — Ambulatory Visit: Payer: Medicare HMO | Admitting: Obstetrics & Gynecology

## 2018-06-17 ENCOUNTER — Ambulatory Visit: Payer: Medicare HMO

## 2018-06-17 ENCOUNTER — Other Ambulatory Visit: Payer: Self-pay

## 2018-06-17 ENCOUNTER — Ambulatory Visit (INDEPENDENT_AMBULATORY_CARE_PROVIDER_SITE_OTHER): Payer: Medicare HMO | Admitting: Family Medicine

## 2018-06-17 ENCOUNTER — Encounter: Payer: Self-pay | Admitting: Family Medicine

## 2018-06-17 VITALS — Ht 62.0 in

## 2018-06-17 DIAGNOSIS — F418 Other specified anxiety disorders: Secondary | ICD-10-CM | POA: Diagnosis not present

## 2018-06-17 DIAGNOSIS — I10 Essential (primary) hypertension: Secondary | ICD-10-CM

## 2018-06-17 DIAGNOSIS — D369 Benign neoplasm, unspecified site: Secondary | ICD-10-CM | POA: Diagnosis not present

## 2018-06-17 DIAGNOSIS — E785 Hyperlipidemia, unspecified: Secondary | ICD-10-CM | POA: Diagnosis not present

## 2018-06-17 DIAGNOSIS — M8580 Other specified disorders of bone density and structure, unspecified site: Secondary | ICD-10-CM

## 2018-06-17 DIAGNOSIS — R69 Illness, unspecified: Secondary | ICD-10-CM | POA: Diagnosis not present

## 2018-06-17 DIAGNOSIS — Z0000 Encounter for general adult medical examination without abnormal findings: Secondary | ICD-10-CM

## 2018-06-17 MED ORDER — LOSARTAN POTASSIUM 100 MG PO TABS: 100.0000 mg | ORAL_TABLET | Freq: Every day | ORAL | 1 refills | Status: DC

## 2018-06-17 MED ORDER — ATORVASTATIN CALCIUM 10 MG PO TABS: 10.0000 mg | ORAL_TABLET | Freq: Every day | ORAL | 3 refills | Status: DC

## 2018-06-17 NOTE — Progress Notes (Signed)
VIRTUAL VISIT VIA VIDEO  I connected with Rebecca Long on 06/17/18 at  2:00 PM EDT by a video enabled telemedicine application and verified that I am speaking with the correct person using two identifiers. Location patient: Home Location provider: [facility], Office Persons participating in the virtual visit: Patient, Dr. Raoul Pitch and R.Baker, LPN  I discussed the limitations of evaluation and management by telemedicine and the availability of in person appointments. The patient expressed understanding and agreed to proceed.  Medicare AWV, subsequent Chief Complaint  Patient presents with  . Medicare Wellness    Pt is doing well with no complaints. Pt has living will and Advanced directives, On file. Pt went to GYN appt 11/2017 and had mammogram.    Patient Care Team    Relationship Specialty Notifications Start End  Ma Hillock, DO PCP - General Family Medicine  10/06/14   Wallene Huh, DPM Consulting Physician Podiatry  10/18/15   Sydnee Levans, MD Consulting Physician Dermatology  10/18/15   Stefanie Libel, MD Consulting Physician Sports Medicine  11/01/15   Ladene Artist, MD Consulting Physician Gastroenterology  11/04/17      History of Present Ilness: Rebecca Long, 68 y.o. , female presents today for Medicare wellness visit.   Ht '5\' 2"'  (1.575 m)   BMI 30.18 kg/m   Hypertension/hyperlipidemia/overweight:  Pt reportscompliance with Losartan 100 mg daily, Lipitor 10 mg daily and daily baby aspirin. Blood pressures ranges at home not routinely checked.Patient denies chest pain, shortness of breath, dizziness or lower extremity edema.  Pt endorses use of daily baby ASA. Pt is prescribed statin. BMP: 05/15/2017 within normal limits CBC: 05/15/2017 within normal limits Lipid:05/15/2017 within normal limits, total cholesterol 162, HDL 63, LDL 79, triglycerides 26 TSH: 05/15/2017 within normal limits Exercise: exercise and pickle ball.  RF: HTN, HLD, Fhx Heart  disease Health maintenance:  Colonoscopy: 08/28/2013, 5 year follow up. Dr. Fuller Plan >>> placed referral Mammogram (50-74): 09/10/2017; at GYN. Had an abnormal then a rpeat normal per pt.  Cervical cancer screening(<65):N/A- following with GYN for urinary incontinence, fibroid and poss.uterine hyperplasia Immunizations: tdap UTD 2016, PNA series completed, shingrix completed Infectious disease screening: completed 2017 DEXA: 10/2015- osteopenia Glaucoma screen:Yearly at [facility] eye Hearing: Whisper test, no barriers identified.  Cognitive assessment:  Memory recall: 3/3. No barriers identified. She feels her stress has made her unable to focus as much as prior.  Past medical, surgical, family and social histories reviewed (including experiences with illnesses, hospital stays, operations, injuries, and treatments):  Past Medical History:  Diagnosis Date  . Achilles tendinitis   . Anxiety   . Blood transfusion without reported diagnosis 1980   ectopic pregnancy  . Depression   . Fibroids   . GERD (gastroesophageal reflux disease)   . Hyperlipidemia   . Hypertension   . Insomnia   . Nerve pain    back  . Osteopenia   . Scoliosis   . Spinal stenosis   . Tubular adenoma of colon 12/2001   All allergies reviewed Allergies  Allergen Reactions  . Latex Itching and Rash  . Polysporin [Bacitracin-Polymyxin B] Rash   Past Surgical History:  Procedure Laterality Date  . BREAST REDUCTION SURGERY Bilateral 2000  . CYST EXCISION  1985   head and rt axilla  . ECTOPIC PREGNANCY SURGERY  1981  . frozen shoulder Right 2007  . PARATHYROIDECTOMY  2008   Family History  Problem Relation Age of Onset  . Heart disease Father 65  .  Breast cancer Mother   . Hypertension Brother   . Hypertension Sister   . Breast cancer Maternal Aunt   . Colon cancer Neg Hx   . Esophageal cancer Neg Hx   . Stomach cancer Neg Hx   . Rectal cancer Neg Hx    Social History   Social History Narrative    Lives with husband. Feels safe at home. 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 [employer]. Some college. Former smoker. Wears seat belt.     All medications verified Allergies as of 06/17/2018      Reactions   Latex Itching, Rash   Polysporin [bacitracin-polymyxin B] Rash      Medication List       Accurate as of Jun 17, 2018  1:59 PM. If you have any questions, ask your nurse or doctor.        atorvastatin 10 MG tablet Commonly known as:  LIPITOR Take 1 tablet (10 mg total) by mouth daily. Needs office visit prior to any additional refills.   buPROPion 150 MG 24 hr tablet Commonly known as:  WELLBUTRIN XL TAKE 1 TABLET BY MOUTH EVERY DAY IN THE MORNING What changed:  Another medication with the same name was removed. Continue taking this medication, and follow the directions you see here. Changed by:  Howard Pouch, DO   losartan 100 MG tablet Commonly known as:  COZAAR Take 1 tablet (100 mg total) by mouth daily.   meloxicam 7.5 MG tablet Commonly known as:  MOBIC Take 7.5 mg by mouth as needed.   omeprazole 40 MG capsule Commonly known as:  PRILOSEC TAKE 1 CAPSULE (40 MG TOTAL) BY MOUTH 2 (TWO) TIMES DAILY BEFORE A MEAL.   valACYclovir 500 MG tablet Commonly known as:  VALTREX Take 500 mg by mouth as needed.   zolpidem 5 MG tablet Commonly known as:  AMBIEN Take 5 mg by mouth at bedtime as needed.       Depression screen [facility] 2/9 06/17/2018 11/04/2017 05/15/2017 04/09/2017 11/12/2016  Decreased Interest 3 0 1 1 0  Down, Depressed, Hopeless 3 0 1 1 0  PHQ - 2 Score 6 0 2 2 0  Altered sleeping 1 - 1 1 0  Tired, decreased energy 3 - '2 3 3  ' Change in appetite 0 - 2 1 0  Feeling bad or failure about yourself  1 - 1 1 0  Trouble concentrating 1 - '1 1 2  ' Moving slowly or fidgety/restless 0 - 0 1 0  Suicidal thoughts 0 - 0 0 0  PHQ-9 Score 12 - '9 10 5  ' Difficult doing work/chores Somewhat difficult - Not difficult at all Not difficult at all -   GAD 7 : Generalized  Anxiety Score 06/17/2018 05/15/2017  Nervous, Anxious, on Edge 3 1  Control/stop worrying 3 1  Worry too much - different things 3 1  Trouble relaxing 3 1  Restless 0 1  Easily annoyed or irritable 3 1  Afraid - awful might happen 3 1  Total GAD 7 Score 18 7  Anxiety Difficulty Somewhat difficult Not difficult at all   Immunizations: Immunization History  Administered Date(s) Administered  . Hepatitis A, Adult 11/30/2016  . Influenza, High Dose Seasonal PF 10/18/2015, 11/12/2016, 10/18/2017  . Influenza,inj,Quad PF,6+ Mos 10/06/2014  . Influenza-Unspecified 10/15/2012  . Pneumococcal Conjugate-13 10/29/2014  . Pneumococcal Polysaccharide-23 11/01/2015  . Td 10/24/2004  . Tdap 10/29/2014  . Zoster 01/30/2007  . Zoster Recombinat (Shingrix) 04/11/2017, 07/25/2017   Exercise: Current  Exercise Habits: Home exercise routine, Type of exercise: Other - see comments;walking, Time (Minutes): 30, Frequency (Times/Week): 1, Weekly Exercise (Minutes/Week): 30, Intensity: Mild   Diet: Regular  Functional Status Survey: Get up and go test: steady and less than 20 seconds. Is the patient deaf or have difficulty hearing?: No Does the patient have difficulty seeing, even when wearing glasses/contacts?: No Does the patient have difficulty concentrating, remembering, or making decisions?: Yes Does the patient have difficulty walking or climbing stairs?: No Does the patient have difficulty dressing or bathing?: No Does the patient have difficulty doing errands alone such as visiting a doctor's office or shopping?: No Current Exercise Habits: Home exercise routine, Type of exercise: Other - see comments;walking, Time (Minutes): 30, Frequency (Times/Week): 1, Weekly Exercise (Minutes/Week): 30, Intensity: Mild   Fall Risk  06/17/2018 11/04/2017 04/09/2017 11/12/2016 10/18/2015  Falls in the past year? 0 No No No No  Risk for fall due to : - - - - History of fall(s)  Risk for fall due to: Comment - - -  - Fell down steps-concussion 2011  Follow up Education provided;Falls evaluation completed;Falls prevention discussed - - - -    Advanced Care Planning: Y- pt reports copy in chart  Cardiovascular Screening Blood Tests Lipid Panel     Component Value Date/Time   CHOL 162 05/15/2017 0905   TRIG 96.0 05/15/2017 0905   HDL 63.30 05/15/2017 0905   CHOLHDL 3 05/15/2017 0905   VLDL 19.2 05/15/2017 0905   LDLCALC 79 05/15/2017 0905    Diabetes Screening Tests Lab Results  Component Value Date   HGBA1C 5.4 02/02/2013    Objective:  Ht '5\' 2"'  (1.575 m)   BMI 30.18 kg/m  Gen: No acute distress. Nontoxic in appearance.  HENT: AT. [place].  MMM.  Eyes:Pupils Equal Round Reactive to light, Extraocular movements intact,  Conjunctiva without redness, discharge or icterus. CV: no edema Chest: Cough or shortness of breath not present Neuro:  Alert. Oriented x3  Psych: Normal affect, dress and demeanor. Normal speech. Normal thought content and judgment.  Assessment and Plan: Essential hypertension, benign/Hyperlipidemia, unspecified hyperlipidemia type/Morbid obesity ([place]) - VS check at lab appt this week. Continue Losartan and statin- refills provided today - CBC - Comp Met (CMET) - TSH - Lipid panel - losartan (COZAAR) 100 MG tablet; Take 1 tablet (100 mg total) by mouth daily.  Dispense: 90 tablet; Refill: 1 - atorvastatin (LIPITOR) 10 MG tablet; Take 1 tablet (10 mg total) by mouth daily. Needs office visit prior to any additional refills.  Dispense: 90 tablet; Refill: 3  Depression with anxiety Managed by J. Hughs. We discussed today her significant increase in both her PHQ and her GAD. She believes it is secondary to Coronavirus.She has an appt next week with her mental health specialist.   Osteopenia, unspecified location DEXa ordered for 11/2018  Medicare annual wellness visit, subsequent Patient was encouraged to exercise greater than 150 minutes a week. Patient was encouraged  to choose a diet filled with fresh fruits and vegetables, and lean meats. AVS provided to patient today for education/recommendation on gender specific health and safety maintenance. Colonoscopy: 08/28/2013, 5 year follow up for tubular adenoma. Dr. Fuller Plan >>> placed referral Mammogram (50-74): 09/10/2017; at GYN. Had an abnormal then a rpeat normal per pt.  Cervical cancer screening(<65):N/A- following with GYN for urinary incontinence, fibroid and poss.uterine hyperplasia Immunizations: tdap UTD 2016, PNA series completed, shingrix completed Infectious disease screening: completed 2017 DEXA: 10/2015- osteopenia Glaucoma screen:Yearly at [facility]  eye Hearing: Whisper test, no barriers identified.  Cognitive assessment:  Memory recall: 3/3. No barriers identified. She feels her stress has made her unable to focus as much as prior.   F/u: 6 mos [place] 1 yr MW  Medicare wellness completed today in addition to chronic medical conditions.  Greater than 15 minutes was spent on chronic medical conditions, greater than 15% of that time was spent face-to-face.  Electronically Signed by: Howard Pouch, DO [place]

## 2018-06-17 NOTE — Patient Instructions (Signed)
Rebecca Long , Thank you for taking time to come for your Medicare Wellness Visit. I appreciate your ongoing commitment to your health goals. Please review the following plan we discussed and let me know if I can assist you in the future.   These are the goals we discussed: Goals    . Patient Stated     Maintain current health       I have referred you to your gastroenterologist to have your colonoscopy completed this year.  You are due for your 5-year follow-up. Please make certain to reschedule your appointment with your eye doctor. Your bone density was ordered to be completed in November for your 3-year follow-up. Your immunizations are all up-to-date until September/October, your influenza vaccine will be due.   Health Maintenance After Age 81 After age 74, you are at a higher risk for certain long-term diseases and infections as well as injuries from falls. Falls are a major cause of broken bones and head injuries in people who are older than age 56. Getting regular preventive care can help to keep you healthy and well. Preventive care includes getting regular testing and making lifestyle changes as recommended by your health care provider. Talk with your health care provider about:  Which screenings and tests you should have. A screening is a test that checks for a disease when you have no symptoms.  A diet and exercise plan that is right for you. What should I know about screenings and tests to prevent falls? Screening and testing are the best ways to find a health problem early. Early diagnosis and treatment give you the best chance of managing medical conditions that are common after age 91. Certain conditions and lifestyle choices may make you more likely to have a fall. Your health care provider may recommend:  Regular vision checks. Poor vision and conditions such as cataracts can make you more likely to have a fall. If you wear glasses, make sure to get your prescription updated  if your vision changes.  Medicine review. Work with your health care provider to regularly review all of the medicines you are taking, including over-the-counter medicines. Ask your health care provider about any side effects that may make you more likely to have a fall. Tell your health care provider if any medicines that you take make you feel dizzy or sleepy.  Osteoporosis screening. Osteoporosis is a condition that causes the bones to get weaker. This can make the bones weak and cause them to break more easily.  Blood pressure screening. Blood pressure changes and medicines to control blood pressure can make you feel dizzy.  Strength and balance checks. Your health care provider may recommend certain tests to check your strength and balance while standing, walking, or changing positions.  Foot health exam. Foot pain and numbness, as well as not wearing proper footwear, can make you more likely to have a fall.  Depression screening. You may be more likely to have a fall if you have a fear of falling, feel emotionally low, or feel unable to do activities that you used to do.  Alcohol use screening. Using too much alcohol can affect your balance and may make you more likely to have a fall. What actions can I take to lower my risk of falls? General instructions  Talk with your health care provider about your risks for falling. Tell your health care provider if: ? You fall. Be sure to tell your health care provider about all falls, even  ones that seem minor. ? You feel dizzy, sleepy, or off-balance.  Take over-the-counter and prescription medicines only as told by your health care provider. These include any supplements.  Eat a healthy diet and maintain a healthy weight. A healthy diet includes low-fat dairy products, low-fat (lean) meats, and fiber from whole grains, beans, and lots of fruits and vegetables. Home safety  Remove any tripping hazards, such as rugs, cords, and clutter.   Install safety equipment such as grab bars in bathrooms and safety rails on stairs.  Keep rooms and walkways well-lit. Activity   Follow a regular exercise program to stay fit. This will help you maintain your balance. Ask your health care provider what types of exercise are appropriate for you.  If you need a cane or walker, use it as recommended by your health care provider.  Wear supportive shoes that have nonskid soles. Lifestyle  Do not drink alcohol if your health care provider tells you not to drink.  If you drink alcohol, limit how much you have: ? 0-1 drink a day for women. ? 0-2 drinks a day for men.  Be aware of how much alcohol is in your drink. In the [place] one drink equals one typical bottle of beer (12 oz), one-half glass of wine (5 oz), or one shot of hard liquor (1 oz).  Do not use any products that contain nicotine or tobacco, such as cigarettes and e-cigarettes. If you need help quitting, ask your health care provider. Summary  Having a healthy lifestyle and getting preventive care can help to protect your health and wellness after age 15.  Screening and testing are the best way to find a health problem early and help you avoid having a fall. Early diagnosis and treatment give you the best chance for managing medical conditions that are more common for people who are older than age 80.  Falls are a major cause of broken bones and head injuries in people who are older than age 51. Take precautions to prevent a fall at home.  Work with your health care provider to learn what changes you can make to improve your health and wellness and to prevent falls. This information is not intended to replace advice given to you by your health care provider. Make sure you discuss any questions you have with your health care provider. Document Released: 11/28/2016 Document Revised: 11/28/2016 Document Reviewed: 11/28/2016 Elsevier Interactive Patient Education  2019 [employer].

## 2018-06-19 ENCOUNTER — Ambulatory Visit: Payer: Medicare HMO | Admitting: Obstetrics & Gynecology

## 2018-06-19 ENCOUNTER — Encounter: Payer: Self-pay | Admitting: Obstetrics & Gynecology

## 2018-06-19 ENCOUNTER — Other Ambulatory Visit: Payer: Self-pay

## 2018-06-19 ENCOUNTER — Ambulatory Visit: Payer: Medicare HMO

## 2018-06-19 VITALS — BP 140/86

## 2018-06-19 DIAGNOSIS — R9389 Abnormal findings on diagnostic imaging of other specified body structures: Secondary | ICD-10-CM | POA: Diagnosis not present

## 2018-06-19 DIAGNOSIS — D219 Benign neoplasm of connective and other soft tissue, unspecified: Secondary | ICD-10-CM

## 2018-06-19 DIAGNOSIS — F419 Anxiety disorder, unspecified: Secondary | ICD-10-CM | POA: Diagnosis not present

## 2018-06-19 DIAGNOSIS — R69 Illness, unspecified: Secondary | ICD-10-CM | POA: Diagnosis not present

## 2018-06-19 NOTE — Progress Notes (Signed)
    Rebecca Long 10/18/1950 [id number]        68 y.o.  G2P0011 married  RP: History of thickened endometrium and uterine fibroid for pelvic ultrasound  HPI: Had a thickened endometrium on pelvic ultrasound in July 2019.  Patient had been scheduled for a sonohysterogram, but instead did a MRI of the pelvis which was reported as a normal endometrial lining.  Therefore patient did not follow-up for a sonohysterogram.  Menopause, on no hormone replacement therapy.  No postmenopausal bleeding.  Occasional pelvic discomfort.  Abstinent.  Urine and bowel movements normal.   OB History  Gravida Para Term Preterm AB Living  2 1     1 1   SAB TAB Ectopic Multiple Live Births      1        # Outcome Date GA Lbr Len/2nd Weight Sex Delivery Anes PTL Lv  2 Ectopic           1 Para             Past medical history,surgical history, problem list, medications, allergies, family history and social history were all reviewed and documented in the EPIC chart.   Directed ROS with pertinent positives and negatives documented in the history of present illness/assessment and plan.  Exam:  Vitals:   06/19/18 1546  BP: 140/86   General appearance:  Normal  Pelvic US today: T/V images.  Compared with previous ultrasound scan August 22, 2017.  Small uterus measured at 5.98 x 3.44 x 2.32 cm.  Pedunculated fibroid to the right fundus measuring 5.6 x 5.5 cm, unchanged.  Thick endometrium with cystic changes and feeder vessel measured at 9.3 mm, unchanged.  Small atrophic ovaries unchanged with a 6 mm simple cyst on the left side which is unchanged.  No free fluid in the posterior cul-de-sac.   Assessment/Plan:  68 y.o. G2P0011   1. Thickened endometrium Persistence of a thickened endometrium with an endometrial lining measuring 9.3 mm.  Probable endometrial polyp.  Follow-up for a sonohysterogram to further investigate.  If an endometrial polyp is present, will proceed with hysteroscopy excision of polyp and  D&C.  If no evidence of endometrial polyp, will proceed with an endometrial biopsy.  Importance of further investigation and treatment discussed with patient. - [place] Sonohysterogram; Future  Counseling on above issues and coordination of care more than 50% for 15 minutes.  Princess Bruins MD, 4:10 PM 06/19/2018

## 2018-06-23 ENCOUNTER — Encounter: Payer: Self-pay | Admitting: Obstetrics & Gynecology

## 2018-06-23 NOTE — Patient Instructions (Signed)
1. Thickened endometrium Persistence of a thickened endometrium with an endometrial lining measuring 9.3 mm.  Probable endometrial polyp.  Follow-up for a sonohysterogram to further investigate.  If an endometrial polyp is present, will proceed with hysteroscopy excision of polyp and D&C.  If no evidence of endometrial polyp, will proceed with an endometrial biopsy.  Importance of further investigation and treatment discussed with patient. - [place] Sonohysterogram; Future  Rebecca Long, it was a pleasure seeing you today!

## 2018-06-27 ENCOUNTER — Other Ambulatory Visit: Payer: Self-pay

## 2018-06-27 ENCOUNTER — Telehealth: Payer: Self-pay | Admitting: Family Medicine

## 2018-06-27 ENCOUNTER — Ambulatory Visit (INDEPENDENT_AMBULATORY_CARE_PROVIDER_SITE_OTHER): Payer: Medicare HMO | Admitting: Family Medicine

## 2018-06-27 DIAGNOSIS — E785 Hyperlipidemia, unspecified: Secondary | ICD-10-CM | POA: Diagnosis not present

## 2018-06-27 DIAGNOSIS — I10 Essential (primary) hypertension: Secondary | ICD-10-CM | POA: Diagnosis not present

## 2018-06-27 LAB — CBC
HCT: 42.4 % (ref 36.0–46.0)
Hemoglobin: 14.2 g/dL (ref 12.0–15.0)
MCHC: 33.4 g/dL (ref 30.0–36.0)
MCV: 88 fl (ref 78.0–100.0)
Platelets: 265 10*3/uL (ref 150.0–400.0)
RBC: 4.82 Mil/uL (ref 3.87–5.11)
RDW: 14.2 % (ref 11.5–15.5)
WBC: 5.9 10*3/uL (ref 4.0–10.5)

## 2018-06-27 LAB — COMPREHENSIVE METABOLIC PANEL
ALT: 14 U/L (ref 0–35)
AST: 13 U/L (ref 0–37)
Albumin: 4.3 g/dL (ref 3.5–5.2)
Alkaline Phosphatase: 52 U/L (ref 39–117)
BUN: 11 mg/dL (ref 6–23)
CO2: 28 mEq/L (ref 19–32)
Calcium: 9.6 mg/dL (ref 8.4–10.5)
Chloride: 104 mEq/L (ref 96–112)
Creatinine, Ser: 0.83 mg/dL (ref 0.40–1.20)
GFR: 68.21 mL/min (ref >60.00)
Glucose, Bld: 90 mg/dL (ref 70–99)
Potassium: 4.5 mEq/L (ref 3.5–5.1)
Sodium: 140 mEq/L (ref 135–145)
Total Bilirubin: 1 mg/dL (ref 0.2–1.2)
Total Protein: 6.7 g/dL (ref 6.0–8.3)

## 2018-06-27 LAB — LIPID PANEL
Cholesterol: 154 mg/dL (ref 0–200)
HDL: 62.9 mg/dL (ref >39.00)
LDL Cholesterol: 72 mg/dL (ref 0–99)
NonHDL: 90.94
Total CHOL/HDL Ratio: 2
Triglycerides: 94 mg/dL (ref 0.0–149.0)
VLDL: 18.8 mg/dL (ref 0.0–40.0)

## 2018-06-27 LAB — TSH: TSH: 1.53 u[IU]/mL (ref 0.35–4.50)

## 2018-06-27 NOTE — Addendum Note (Signed)
Addended by: Ralph Dowdy on: 06/27/2018 08:23 AM   Modules accepted: Orders

## 2018-06-27 NOTE — Telephone Encounter (Signed)
Please inform patient the following information: Her BP was great.  Her labs were all normal.

## 2018-06-27 NOTE — Progress Notes (Addendum)
Rebecca Long is a 68 y.o. female presents to the office today for Blood pressure recheck secondary to verbal orders and Virtual visit Blood pressure medication: losartan 100mg    If on medication, Last dose was at least 1-2 hours prior to recheck: Yes Blood pressure was taken in the right arm after patient rested for 10 minutes.  BP 128/88 (BP Location: Right Arm, Patient Position: Sitting, Cuff Size: Normal)   Pulse 66   Resp 16   SpO2 98%   Kathie Dike, CMA   Great.  Electronically Signed by: Howard Pouch, DO [place] [facility] [place]

## 2018-06-30 NOTE — Telephone Encounter (Signed)
Spoke with patient to give results. Stated verbal understanding.

## 2018-06-30 NOTE — Telephone Encounter (Signed)
LM for patient to call for results.

## 2018-07-03 ENCOUNTER — Encounter: Payer: Self-pay | Admitting: Gastroenterology

## 2018-07-07 ENCOUNTER — Ambulatory Visit: Payer: Medicare HMO

## 2018-07-23 ENCOUNTER — Other Ambulatory Visit: Payer: Self-pay

## 2018-07-24 ENCOUNTER — Encounter: Payer: Self-pay | Admitting: Obstetrics & Gynecology

## 2018-07-24 ENCOUNTER — Ambulatory Visit (INDEPENDENT_AMBULATORY_CARE_PROVIDER_SITE_OTHER): Payer: Medicare HMO

## 2018-07-24 ENCOUNTER — Ambulatory Visit: Payer: Medicare HMO | Admitting: Obstetrics & Gynecology

## 2018-07-24 DIAGNOSIS — N840 Polyp of corpus uteri: Secondary | ICD-10-CM | POA: Diagnosis not present

## 2018-07-24 DIAGNOSIS — R9389 Abnormal findings on diagnostic imaging of other specified body structures: Secondary | ICD-10-CM

## 2018-07-24 NOTE — Progress Notes (Signed)
    Rebecca Long 05/27/1950 [id number]        68 y.o.  G2P0011   RP: Thickened Endometrium with cystic changes for Sonohysterogram  HPI: Pelvic US 06/19/2018 showed a thick endometrium with cystic changes and feeder vessel measured at 9.3 mm, unchanged since 07/2017.  No PMB. No pelvic pain.   OB History  Gravida Para Term Preterm AB Living  2 1     1 1   SAB TAB Ectopic Multiple Live Births      1        # Outcome Date GA Lbr Len/2nd Weight Sex Delivery Anes PTL Lv  2 Ectopic           1 Para             Past medical history,surgical history, problem list, medications, allergies, family history and social history were all reviewed and documented in the EPIC chart.   Directed ROS with pertinent positives and negatives documented in the history of present illness/assessment and plan.  Exam:  There were no vitals filed for this visit. General appearance:  Normal                                                                    Sono Infusion Hysterogram ( procedure note)   The initial transvaginal ultrasound demonstrated the following: T/V images.  Uterus unchanged since May 29th 2020 ultrasound.  Pedunculated fibroid  unchanged at 5.6 cm.  Endometrial lining remains thickened at 9.3 mm. Both ovaries are small with atrophic appearance.  No free fluid in the posterior cutis sac.   The speculum  was inserted and the cervix cleansed with Betadine solution after confirming that patient has no allergies.A small sonohysterography catheterwas utilized.  Insertion was facilitated with ring forceps, using a spear-like motion the catheter was inserted to the fundus of the uterus. The speculum is then removed carefully to avoid dislodging the catheter. The catheter was flushed with sterile saline delete prior to insertion to rid it of small amounts of air.the sterile saline solution was infused into the uterine cavity as a vaginal ultrasound probe was then placed in the vagina for full  visualization of the uterine cavity from a transvaginal approach. The following was noted: The endometrial cavity is filled with saline, a 1.8 x 1.0 cm endometrial mass is present.  The catheter was then removed after retrieving some of the saline from the intrauterine cavity. An endometrial biopsy was not done. Patient tolerated procedure well. She had received a tablet of Aleve for discomfort.    Assessment/Plan:  68 y.o. G2P0011   1. Thickened endometrium Endometrial Polyp confirmed by Sonohysto measuring 1.8 x 1.0 cm.  Sonohysterogram findings reviewed with patient.  Patient informed about the endometrial polyp and management.  We will proceed with a hysteroscopy with MyoSure excision of the endometrial polyp and D&C.  Surgical procedure briefly discussed and pamphlet given.  Follow-up preop.  2. Endometrial polyp As above.  Counseling on above issues and coordination of care more than 50% for 15 minutes.  Princess Bruins MD, 11:32 AM 07/24/2018

## 2018-07-24 NOTE — Patient Instructions (Signed)
1. Thickened endometrium Endometrial Polyp confirmed by Sonohysto measuring 1.8 x 1.0 cm.  Sonohysterogram findings reviewed with patient.  Patient informed about the endometrial polyp and management.  We will proceed with a hysteroscopy with MyoSure excision of the endometrial polyp and D&C.  Surgical procedure briefly discussed and pamphlet given.  Follow-up preop.  2. Endometrial polyp As above.  Rebecca Long, it was a pleasure seeing you today!

## 2018-07-29 ENCOUNTER — Telehealth: Payer: Self-pay

## 2018-07-29 NOTE — Telephone Encounter (Signed)
Televisit is fine

## 2018-07-29 NOTE — Telephone Encounter (Signed)
Patient is scheduled for Hysteroscopy D&C w Myosure exc on 7/15.  She is scheduled to see you on 7/7 for pre op . She asked if this was really  Necessary because all her questions are answered OR if she could make it a telephone visit and not have to come in to the office?

## 2018-07-29 NOTE — Telephone Encounter (Signed)
Patient informed. Appt arranged.

## 2018-08-05 ENCOUNTER — Encounter: Payer: Self-pay | Admitting: Obstetrics & Gynecology

## 2018-08-05 ENCOUNTER — Ambulatory Visit (INDEPENDENT_AMBULATORY_CARE_PROVIDER_SITE_OTHER): Payer: Medicare HMO | Admitting: Obstetrics & Gynecology

## 2018-08-05 ENCOUNTER — Other Ambulatory Visit: Payer: Self-pay

## 2018-08-05 DIAGNOSIS — N840 Polyp of corpus uteri: Secondary | ICD-10-CM | POA: Diagnosis not present

## 2018-08-05 NOTE — Progress Notes (Signed)
    Rebecca Long Aug 15, 1950 [id number]        68 y.o.  G2P0011 Televisit by phone.  Patient consented to a televisit.  Patient identified.  Phone Televisit conducted between patient at her home and myself in my [place] office.  Counseling done x 20 minutes.  RP: Preop for [facility] Myosure Excision of Polyp/D+C  HPI: SonoHysto on 07/24/18 Endometrial Polyp 1.8 cm.  Pelvic US 07/24/2018 showed a thick endometrium with cystic changes and feeder vessel measured at 9.3 mm, unchanged since 05/2018.  No PMB. No pelvic pain.   OB History  Gravida Para Term Preterm AB Living  2 1     1 1   SAB TAB Ectopic Multiple Live Births      1        # Outcome Date GA Lbr Len/2nd Weight Sex Delivery Anes PTL Lv  2 Ectopic           1 Para             Past medical history,surgical history, problem list, medications, allergies, family history and social history were all reviewed and documented in the EPIC chart.   Directed ROS with pertinent positives and negatives documented in the history of present illness/assessment and plan.  Exam:  There were no vitals filed for this visit. General appearance:  Normal  Televisit   Sono Infusion Hysterogram ( procedure note)   The initial transvaginal ultrasound demonstrated the following: T/V images.  Uterus unchanged since May 29th 2020 ultrasound.  Pedunculated fibroid  unchanged at 5.6 cm.  Endometrial lining remains thickened at 9.3 mm. Both ovaries are small with atrophic appearance.  No free fluid in the posterior cutis sac.   The speculum  was inserted and the cervix cleansed with Betadine solution after confirming that patient has no allergies.A small sonohysterography catheterwas utilized.  Insertion was facilitated with ring forceps, using a spear-like motion the catheter was inserted to the fundus of the uterus. The speculum is then removed carefully to avoid dislodging the catheter. The catheter was flushed with sterile saline delete prior to insertion to  rid it of small amounts of air.the sterile saline solution was infused into the uterine cavity as a vaginal ultrasound probe was then placed in the vagina for full visualization of the uterine cavity from a transvaginal approach. The following was noted: The endometrial cavity is filled with saline, a 1.8 x 1.0 cm endometrial mass is present.  The catheter was then removed after retrieving some of the saline from the intrauterine cavity. An endometrial biopsy was not done. Patient tolerated procedure well. She had received a tablet of Aleve for discomfort.    Assessment/Plan:  68 y.o. G2P0011   1. Thickened endometrium Endometrial Polyp confirmed by Sonohysto measuring 1.8 x 1.0 cm.  Sonohysterogram findings reviewed with patient.  Patient informed about the endometrial polyp and management.  We will proceed with a hysteroscopy with MyoSure excision of the endometrial polyp and D&C.  Surgical procedure thoroughly reviewed and discussed including risks of trauma/uterine perforation, infection, hemorrhage, anesthesia complications.  Preop and postop management reviewed.  2. Endometrial polyp As above.   Princess Bruins MD, 3:22 PM 08/05/2018

## 2018-08-07 ENCOUNTER — Other Ambulatory Visit: Payer: Self-pay

## 2018-08-07 ENCOUNTER — Ambulatory Visit (AMBULATORY_SURGERY_CENTER): Payer: Self-pay | Admitting: *Deleted

## 2018-08-07 VITALS — Ht 62.5 in | Wt 162.0 lb

## 2018-08-07 DIAGNOSIS — G4700 Insomnia, unspecified: Secondary | ICD-10-CM | POA: Diagnosis not present

## 2018-08-07 DIAGNOSIS — R69 Illness, unspecified: Secondary | ICD-10-CM | POA: Diagnosis not present

## 2018-08-07 DIAGNOSIS — Z7722 Contact with and (suspected) exposure to environmental tobacco smoke (acute) (chronic): Secondary | ICD-10-CM | POA: Diagnosis not present

## 2018-08-07 DIAGNOSIS — M1990 Unspecified osteoarthritis, unspecified site: Secondary | ICD-10-CM | POA: Diagnosis not present

## 2018-08-07 DIAGNOSIS — Z791 Long term (current) use of non-steroidal anti-inflammatories (NSAID): Secondary | ICD-10-CM | POA: Diagnosis not present

## 2018-08-07 DIAGNOSIS — K222 Esophageal obstruction: Secondary | ICD-10-CM

## 2018-08-07 DIAGNOSIS — Z803 Family history of malignant neoplasm of breast: Secondary | ICD-10-CM | POA: Diagnosis not present

## 2018-08-07 DIAGNOSIS — R32 Unspecified urinary incontinence: Secondary | ICD-10-CM | POA: Diagnosis not present

## 2018-08-07 DIAGNOSIS — Z86010 Personal history of colonic polyps: Secondary | ICD-10-CM

## 2018-08-07 DIAGNOSIS — E785 Hyperlipidemia, unspecified: Secondary | ICD-10-CM | POA: Diagnosis not present

## 2018-08-07 DIAGNOSIS — I10 Essential (primary) hypertension: Secondary | ICD-10-CM | POA: Diagnosis not present

## 2018-08-07 DIAGNOSIS — F419 Anxiety disorder, unspecified: Secondary | ICD-10-CM | POA: Diagnosis not present

## 2018-08-07 MED ORDER — SUPREP BOWEL PREP KIT 17.5-3.13-1.6 GM/177ML PO SOLN: 1.0000 | Freq: Once | ORAL | 0 refills | Status: AC

## 2018-08-07 NOTE — Progress Notes (Signed)
No egg or soy allergy known to patient  No issues with past sedation with any surgeries  or procedures, no intubation problems  No diet pills per patient No home 02 use per patient  No blood thinners per patient  Pt denies issues with constipation  No A fib or A flutter  EMMI information given to the patient.

## 2018-08-08 ENCOUNTER — Encounter: Payer: Self-pay | Admitting: Obstetrics & Gynecology

## 2018-08-09 ENCOUNTER — Other Ambulatory Visit (HOSPITAL_COMMUNITY)
Admission: RE | Admit: 2018-08-09 | Discharge: 2018-08-09 | Disposition: A | Discharge status: Home / Self Care | Payer: Medicare HMO | Source: Ambulatory Visit | Attending: Obstetrics & Gynecology | Admitting: Obstetrics & Gynecology

## 2018-08-09 DIAGNOSIS — Z1159 Encounter for screening for other viral diseases: Secondary | ICD-10-CM | POA: Diagnosis not present

## 2018-08-09 DIAGNOSIS — Z01812 Encounter for preprocedural laboratory examination: Secondary | ICD-10-CM | POA: Insufficient documentation

## 2018-08-09 LAB — SARS CORONAVIRUS 2 (TAT 6-24 HRS): SARS Coronavirus 2: NEGATIVE

## 2018-08-11 ENCOUNTER — Other Ambulatory Visit: Payer: Self-pay

## 2018-08-11 ENCOUNTER — Encounter (HOSPITAL_COMMUNITY)
Admission: RE | Admit: 2018-08-11 | Discharge: 2018-08-11 | Disposition: A | Discharge status: Home / Self Care | Payer: Medicare HMO | Source: Ambulatory Visit | Attending: Obstetrics & Gynecology | Admitting: Obstetrics & Gynecology

## 2018-08-11 DIAGNOSIS — N920 Excessive and frequent menstruation with regular cycle: Secondary | ICD-10-CM | POA: Diagnosis not present

## 2018-08-11 DIAGNOSIS — Z87891 Personal history of nicotine dependence: Secondary | ICD-10-CM | POA: Diagnosis not present

## 2018-08-11 DIAGNOSIS — M8580 Other specified disorders of bone density and structure, unspecified site: Secondary | ICD-10-CM | POA: Diagnosis not present

## 2018-08-11 DIAGNOSIS — M419 Scoliosis, unspecified: Secondary | ICD-10-CM | POA: Diagnosis not present

## 2018-08-11 DIAGNOSIS — Z79899 Other long term (current) drug therapy: Secondary | ICD-10-CM | POA: Diagnosis not present

## 2018-08-11 DIAGNOSIS — E892 Postprocedural hypoparathyroidism: Secondary | ICD-10-CM | POA: Diagnosis not present

## 2018-08-11 DIAGNOSIS — I10 Essential (primary) hypertension: Secondary | ICD-10-CM | POA: Diagnosis not present

## 2018-08-11 DIAGNOSIS — G4700 Insomnia, unspecified: Secondary | ICD-10-CM | POA: Diagnosis not present

## 2018-08-11 DIAGNOSIS — Z803 Family history of malignant neoplasm of breast: Secondary | ICD-10-CM | POA: Diagnosis not present

## 2018-08-11 DIAGNOSIS — M5136 Other intervertebral disc degeneration, lumbar region: Secondary | ICD-10-CM | POA: Diagnosis not present

## 2018-08-11 DIAGNOSIS — F419 Anxiety disorder, unspecified: Secondary | ICD-10-CM | POA: Diagnosis not present

## 2018-08-11 DIAGNOSIS — E785 Hyperlipidemia, unspecified: Secondary | ICD-10-CM | POA: Diagnosis not present

## 2018-08-11 DIAGNOSIS — N858 Other specified noninflammatory disorders of uterus: Secondary | ICD-10-CM | POA: Diagnosis not present

## 2018-08-11 DIAGNOSIS — F329 Major depressive disorder, single episode, unspecified: Secondary | ICD-10-CM | POA: Diagnosis not present

## 2018-08-11 DIAGNOSIS — Z881 Allergy status to other antibiotic agents status: Secondary | ICD-10-CM | POA: Diagnosis not present

## 2018-08-11 DIAGNOSIS — K449 Diaphragmatic hernia without obstruction or gangrene: Secondary | ICD-10-CM | POA: Diagnosis not present

## 2018-08-11 DIAGNOSIS — K219 Gastro-esophageal reflux disease without esophagitis: Secondary | ICD-10-CM | POA: Diagnosis not present

## 2018-08-11 DIAGNOSIS — Z91040 Latex allergy status: Secondary | ICD-10-CM | POA: Diagnosis not present

## 2018-08-11 DIAGNOSIS — Z8249 Family history of ischemic heart disease and other diseases of the circulatory system: Secondary | ICD-10-CM | POA: Diagnosis not present

## 2018-08-11 DIAGNOSIS — N856 Intrauterine synechiae: Secondary | ICD-10-CM | POA: Diagnosis not present

## 2018-08-11 DIAGNOSIS — R9389 Abnormal findings on diagnostic imaging of other specified body structures: Secondary | ICD-10-CM | POA: Diagnosis present

## 2018-08-11 DIAGNOSIS — M48061 Spinal stenosis, lumbar region without neurogenic claudication: Secondary | ICD-10-CM | POA: Diagnosis not present

## 2018-08-11 DIAGNOSIS — K222 Esophageal obstruction: Secondary | ICD-10-CM | POA: Diagnosis not present

## 2018-08-11 DIAGNOSIS — R69 Illness, unspecified: Secondary | ICD-10-CM | POA: Diagnosis not present

## 2018-08-11 LAB — CBC
HCT: 43.9 % (ref 36.0–46.0)
Hemoglobin: 13.7 g/dL (ref 12.0–15.0)
MCH: 28.4 pg (ref 26.0–34.0)
MCHC: 31.2 g/dL (ref 30.0–36.0)
MCV: 91.1 fL (ref 80.0–100.0)
Platelets: 262 10*3/uL (ref 150–400)
RBC: 4.82 MIL/uL (ref 3.87–5.11)
RDW: 13.3 % (ref 11.5–15.5)
WBC: 5.5 10*3/uL (ref 4.0–10.5)
nRBC: 0 % (ref 0.0–0.2)

## 2018-08-11 LAB — BASIC METABOLIC PANEL
Anion gap: 9 (ref 5–15)
BUN: 17 mg/dL (ref 8–23)
CO2: 26 mmol/L (ref 22–32)
Calcium: 9.5 mg/dL (ref 8.9–10.3)
Chloride: 106 mmol/L (ref 98–111)
Creatinine, Ser: 0.73 mg/dL (ref 0.44–1.00)
GFR calc Af Amer: >60 mL/min (ref >60)
GFR calc non Af Amer: >60 mL/min (ref >60)
Glucose, Bld: 88 mg/dL (ref 70–99)
Potassium: 4.5 mmol/L (ref 3.5–5.1)
Sodium: 141 mmol/L (ref 135–145)

## 2018-08-12 ENCOUNTER — Encounter (HOSPITAL_BASED_OUTPATIENT_CLINIC_OR_DEPARTMENT_OTHER): Payer: Self-pay | Admitting: *Deleted

## 2018-08-12 ENCOUNTER — Other Ambulatory Visit: Payer: Self-pay

## 2018-08-12 ENCOUNTER — Telehealth: Payer: Self-pay

## 2018-08-12 NOTE — Progress Notes (Addendum)
Spoke w/ Rebecca Long via phone for pre-op interview.  Npo after mn.  Arrive at 0800.  Lab work dated 08-11-2018 (cbc, cmp, ekg) results in chart and epic.  Rebecca Long had covid test done 08-09-2018.  Will take prozac and wellbutrin am dos w/ sips of water.  Rebecca Long stated she has discomfort right goin area lower right quadrant.  Stated she had not done anything to pull a muscle intermittant for a week.  Rebecca Long verbalized understanding if she has concerns or becomes worse to call her pcp and also she can let dr Dellis Filbert know tomorrow before surgery.

## 2018-08-12 NOTE — Progress Notes (Signed)

## 2018-08-12 NOTE — Telephone Encounter (Signed)
Opened in error

## 2018-08-13 ENCOUNTER — Ambulatory Visit (HOSPITAL_BASED_OUTPATIENT_CLINIC_OR_DEPARTMENT_OTHER): Payer: Medicare HMO | Admitting: Certified Registered"

## 2018-08-13 ENCOUNTER — Encounter (HOSPITAL_BASED_OUTPATIENT_CLINIC_OR_DEPARTMENT_OTHER): Payer: Self-pay

## 2018-08-13 ENCOUNTER — Encounter (HOSPITAL_BASED_OUTPATIENT_CLINIC_OR_DEPARTMENT_OTHER)
Admission: RE | Disposition: A | Discharge status: Home / Self Care | Payer: Self-pay | Source: Home / Self Care | Attending: Obstetrics & Gynecology

## 2018-08-13 ENCOUNTER — Other Ambulatory Visit: Payer: Self-pay

## 2018-08-13 ENCOUNTER — Ambulatory Visit (HOSPITAL_BASED_OUTPATIENT_CLINIC_OR_DEPARTMENT_OTHER)
Admission: RE | Admit: 2018-08-13 | Discharge: 2018-08-13 | Disposition: A | Discharge status: Home / Self Care | Payer: Medicare HMO | Attending: Obstetrics & Gynecology | Admitting: Obstetrics & Gynecology

## 2018-08-13 DIAGNOSIS — F419 Anxiety disorder, unspecified: Secondary | ICD-10-CM | POA: Insufficient documentation

## 2018-08-13 DIAGNOSIS — G4700 Insomnia, unspecified: Secondary | ICD-10-CM | POA: Insufficient documentation

## 2018-08-13 DIAGNOSIS — E785 Hyperlipidemia, unspecified: Secondary | ICD-10-CM | POA: Diagnosis not present

## 2018-08-13 DIAGNOSIS — Z79899 Other long term (current) drug therapy: Secondary | ICD-10-CM | POA: Insufficient documentation

## 2018-08-13 DIAGNOSIS — M48061 Spinal stenosis, lumbar region without neurogenic claudication: Secondary | ICD-10-CM | POA: Insufficient documentation

## 2018-08-13 DIAGNOSIS — N920 Excessive and frequent menstruation with regular cycle: Secondary | ICD-10-CM | POA: Insufficient documentation

## 2018-08-13 DIAGNOSIS — R9389 Abnormal findings on diagnostic imaging of other specified body structures: Secondary | ICD-10-CM | POA: Diagnosis not present

## 2018-08-13 DIAGNOSIS — K449 Diaphragmatic hernia without obstruction or gangrene: Secondary | ICD-10-CM | POA: Diagnosis not present

## 2018-08-13 DIAGNOSIS — F329 Major depressive disorder, single episode, unspecified: Secondary | ICD-10-CM | POA: Insufficient documentation

## 2018-08-13 DIAGNOSIS — I10 Essential (primary) hypertension: Secondary | ICD-10-CM | POA: Insufficient documentation

## 2018-08-13 DIAGNOSIS — E892 Postprocedural hypoparathyroidism: Secondary | ICD-10-CM | POA: Insufficient documentation

## 2018-08-13 DIAGNOSIS — K222 Esophageal obstruction: Secondary | ICD-10-CM | POA: Insufficient documentation

## 2018-08-13 DIAGNOSIS — N858 Other specified noninflammatory disorders of uterus: Secondary | ICD-10-CM | POA: Insufficient documentation

## 2018-08-13 DIAGNOSIS — Z803 Family history of malignant neoplasm of breast: Secondary | ICD-10-CM | POA: Insufficient documentation

## 2018-08-13 DIAGNOSIS — K219 Gastro-esophageal reflux disease without esophagitis: Secondary | ICD-10-CM | POA: Insufficient documentation

## 2018-08-13 DIAGNOSIS — M8580 Other specified disorders of bone density and structure, unspecified site: Secondary | ICD-10-CM | POA: Insufficient documentation

## 2018-08-13 DIAGNOSIS — Z87891 Personal history of nicotine dependence: Secondary | ICD-10-CM | POA: Insufficient documentation

## 2018-08-13 DIAGNOSIS — Z91040 Latex allergy status: Secondary | ICD-10-CM | POA: Insufficient documentation

## 2018-08-13 DIAGNOSIS — M1990 Unspecified osteoarthritis, unspecified site: Secondary | ICD-10-CM | POA: Diagnosis not present

## 2018-08-13 DIAGNOSIS — R69 Illness, unspecified: Secondary | ICD-10-CM | POA: Diagnosis not present

## 2018-08-13 DIAGNOSIS — Z8249 Family history of ischemic heart disease and other diseases of the circulatory system: Secondary | ICD-10-CM | POA: Insufficient documentation

## 2018-08-13 DIAGNOSIS — N856 Intrauterine synechiae: Secondary | ICD-10-CM | POA: Insufficient documentation

## 2018-08-13 DIAGNOSIS — M5136 Other intervertebral disc degeneration, lumbar region: Secondary | ICD-10-CM | POA: Insufficient documentation

## 2018-08-13 DIAGNOSIS — Z881 Allergy status to other antibiotic agents status: Secondary | ICD-10-CM | POA: Insufficient documentation

## 2018-08-13 DIAGNOSIS — M419 Scoliosis, unspecified: Secondary | ICD-10-CM | POA: Insufficient documentation

## 2018-08-13 HISTORY — DX: Abnormal findings on diagnostic imaging of other specified body structures: R93.89

## 2018-08-13 HISTORY — DX: Spinal stenosis, lumbar region without neurogenic claudication: M48.061

## 2018-08-13 HISTORY — DX: Personal history of other diseases of the digestive system: Z87.19

## 2018-08-13 HISTORY — DX: Personal history of adenomatous and serrated colon polyps: Z86.0101

## 2018-08-13 HISTORY — PX: DILATATION & CURETTAGE/HYSTEROSCOPY WITH MYOSURE: SHX6511

## 2018-08-13 HISTORY — DX: Personal history of other endocrine, nutritional and metabolic disease: Z86.39

## 2018-08-13 HISTORY — DX: Personal history of colonic polyps: Z86.010

## 2018-08-13 HISTORY — DX: Polyp of corpus uteri: N84.0

## 2018-08-13 HISTORY — DX: Other intervertebral disc degeneration, lumbar region without mention of lumbar back pain or lower extremity pain: M51.369

## 2018-08-13 HISTORY — DX: Leiomyoma of uterus, unspecified: D25.9

## 2018-08-13 HISTORY — DX: Esophageal obstruction: K22.2

## 2018-08-13 HISTORY — DX: Other intervertebral disc degeneration, lumbar region: M51.36

## 2018-08-13 HISTORY — DX: Diaphragmatic hernia without obstruction or gangrene: K44.9

## 2018-08-13 HISTORY — PX: DILATION AND CURETTAGE OF UTERUS: SHX78

## 2018-08-13 HISTORY — DX: Presence of spectacles and contact lenses: Z97.3

## 2018-08-13 SURGERY — DILATATION & CURETTAGE/HYSTEROSCOPY WITH MYOSURE
Anesthesia: General

## 2018-08-13 MED ORDER — FENTANYL CITRATE (PF) 100 MCG/2ML IJ SOLN
INTRAMUSCULAR | Status: AC
  Filled 2018-08-13: qty 2

## 2018-08-13 MED ORDER — LACTATED RINGERS IV SOLN
INTRAVENOUS | Status: DC
  Filled 2018-08-13: qty 1000

## 2018-08-13 MED ORDER — ONDANSETRON HCL 4 MG/2ML IJ SOLN
INTRAMUSCULAR | Status: AC
  Filled 2018-08-13: qty 2

## 2018-08-13 MED ORDER — OXYCODONE HCL 5 MG PO TABS
5.0000 mg | ORAL_TABLET | Freq: Once | ORAL | Status: DC | PRN
  Filled 2018-08-13: qty 1

## 2018-08-13 MED ORDER — DEXAMETHASONE SODIUM PHOSPHATE 10 MG/ML IJ SOLN
INTRAMUSCULAR | Status: DC | PRN
  Administered 2018-08-13: 5 mg via INTRAVENOUS

## 2018-08-13 MED ORDER — HYDROMORPHONE HCL 1 MG/ML IJ SOLN
0.2500 mg | INTRAMUSCULAR | Status: DC | PRN
  Filled 2018-08-13: qty 0.5

## 2018-08-13 MED ORDER — DEXAMETHASONE SODIUM PHOSPHATE 10 MG/ML IJ SOLN
INTRAMUSCULAR | Status: AC
  Filled 2018-08-13: qty 1

## 2018-08-13 MED ORDER — LIDOCAINE HCL 1 % IJ SOLN
INTRAMUSCULAR | Status: DC | PRN
  Administered 2018-08-13: 20 mL

## 2018-08-13 MED ORDER — OXYCODONE HCL 5 MG/5ML PO SOLN
5.0000 mg | Freq: Once | ORAL | Status: DC | PRN
  Filled 2018-08-13: qty 5

## 2018-08-13 MED ORDER — PROMETHAZINE HCL 25 MG/ML IJ SOLN
6.2500 mg | INTRAMUSCULAR | Status: DC | PRN
  Filled 2018-08-13: qty 1

## 2018-08-13 MED ORDER — PROPOFOL 10 MG/ML IV BOLUS
INTRAVENOUS | Status: AC
  Filled 2018-08-13: qty 20

## 2018-08-13 MED ORDER — CEFAZOLIN SODIUM-DEXTROSE 2-4 GM/100ML-% IV SOLN
INTRAVENOUS | Status: AC
  Filled 2018-08-13: qty 100

## 2018-08-13 MED ORDER — LACTATED RINGERS IV SOLN
INTRAVENOUS | Status: DC
  Administered 2018-08-13: 11:00:00 via INTRAVENOUS
  Administered 2018-08-13: 125 mL/h via INTRAVENOUS
  Filled 2018-08-13: qty 1000

## 2018-08-13 MED ORDER — KETOROLAC TROMETHAMINE 30 MG/ML IJ SOLN
INTRAMUSCULAR | Status: DC | PRN
  Administered 2018-08-13: 30 mg via INTRAVENOUS

## 2018-08-13 MED ORDER — SODIUM CHLORIDE 0.9 % IR SOLN
Status: DC | PRN
  Administered 2018-08-13: 3000 mL

## 2018-08-13 MED ORDER — ONDANSETRON HCL 4 MG/2ML IJ SOLN
INTRAMUSCULAR | Status: DC | PRN
  Administered 2018-08-13: 4 mg via INTRAVENOUS

## 2018-08-13 MED ORDER — MIDAZOLAM HCL 5 MG/5ML IJ SOLN
INTRAMUSCULAR | Status: DC | PRN
  Administered 2018-08-13: 1 mg via INTRAVENOUS

## 2018-08-13 MED ORDER — FENTANYL CITRATE (PF) 100 MCG/2ML IJ SOLN
INTRAMUSCULAR | Status: DC | PRN
  Administered 2018-08-13: 50 ug via INTRAVENOUS
  Administered 2018-08-13: 25 ug via INTRAVENOUS

## 2018-08-13 MED ORDER — PROPOFOL 10 MG/ML IV BOLUS
INTRAVENOUS | Status: DC | PRN
  Administered 2018-08-13: 140 mg via INTRAVENOUS
  Administered 2018-08-13: 60 mg via INTRAVENOUS

## 2018-08-13 MED ORDER — KETOROLAC TROMETHAMINE 30 MG/ML IJ SOLN
INTRAMUSCULAR | Status: AC
  Filled 2018-08-13: qty 1

## 2018-08-13 MED ORDER — LIDOCAINE 2% (20 MG/ML) 5 ML SYRINGE
INTRAMUSCULAR | Status: AC
  Filled 2018-08-13: qty 5

## 2018-08-13 MED ORDER — CEFAZOLIN SODIUM-DEXTROSE 2-4 GM/100ML-% IV SOLN
2.0000 g | INTRAVENOUS | Status: AC
  Administered 2018-08-13: 2 g via INTRAVENOUS
  Filled 2018-08-13: qty 100

## 2018-08-13 MED ORDER — MIDAZOLAM HCL 2 MG/2ML IJ SOLN
INTRAMUSCULAR | Status: AC
  Filled 2018-08-13: qty 2

## 2018-08-13 MED ORDER — MEPERIDINE HCL 25 MG/ML IJ SOLN
6.2500 mg | INTRAMUSCULAR | Status: DC | PRN
  Filled 2018-08-13: qty 1

## 2018-08-13 MED ORDER — EPHEDRINE SULFATE-NACL 50-0.9 MG/10ML-% IV SOSY
PREFILLED_SYRINGE | INTRAVENOUS | Status: DC | PRN
  Administered 2018-08-13: 5 mg via INTRAVENOUS

## 2018-08-13 MED ORDER — LIDOCAINE 2% (20 MG/ML) 5 ML SYRINGE
INTRAMUSCULAR | Status: DC | PRN
  Administered 2018-08-13: 50 mg via INTRAVENOUS

## 2018-08-13 SURGICAL SUPPLY — 23 items
CANISTER SUCT 3000ML PPV (MISCELLANEOUS) ×2 IMPLANT
CATH ROBINSON RED A/P 16FR (CATHETERS) ×2 IMPLANT
COVER WAND RF STERILE (DRAPES) ×2 IMPLANT
DEVICE MYOSURE LITE (MISCELLANEOUS) IMPLANT
DEVICE MYOSURE REACH (MISCELLANEOUS) IMPLANT
DILATOR CANAL MILEX (MISCELLANEOUS) IMPLANT
ELECT REM PT RETURN 9FT ADLT (ELECTROSURGICAL)
ELECTRODE REM PT RTRN 9FT ADLT (ELECTROSURGICAL) IMPLANT
GAUZE 4X4 16PLY RFD (DISPOSABLE) ×2 IMPLANT
GLOVE BIO SURGEON STRL SZ 6.5 (GLOVE) ×2 IMPLANT
GLOVE BIOGEL PI IND STRL 7.0 (GLOVE) ×2 IMPLANT
GLOVE BIOGEL PI INDICATOR 7.0 (GLOVE) ×2
GOWN STRL REUS W/TWL LRG LVL3 (GOWN DISPOSABLE) ×4 IMPLANT
IV NS IRRIG 3000ML ARTHROMATIC (IV SOLUTION) ×4 IMPLANT
KIT PROCEDURE FLUENT (KITS) ×2 IMPLANT
MYOSURE XL FIBROID (MISCELLANEOUS) ×2
PACK VAGINAL MINOR WOMEN LF (CUSTOM PROCEDURE TRAY) ×2 IMPLANT
PAD OB MATERNITY 4.3X12.25 (PERSONAL CARE ITEMS) ×2 IMPLANT
PAD PREP 24X48 CUFFED NSTRL (MISCELLANEOUS) ×2 IMPLANT
SEAL CERVICAL OMNI LOK (ABLATOR) IMPLANT
SEAL ROD LENS SCOPE MYOSURE (ABLATOR) ×2 IMPLANT
SYSTEM TISS REMOVAL MYOSURE XL (MISCELLANEOUS) IMPLANT
TOWEL OR 17X26 10 PK STRL BLUE (TOWEL DISPOSABLE) ×4 IMPLANT

## 2018-08-13 NOTE — H&P (Signed)
Rebecca Long is an 68 y.o. female. [id number]   RP: [place] Myosure Excision of Polyp/D+C  HPI: SonoHysto on 07/24/18 Endometrial Polyp 1.8 cm.  Pelvic US 07/24/2018 showed a thick endometrium with cystic changes and feeder vessel measured at 9.3 mm, unchangedsince 05/2018. No PMB.No pelvic pain.   Pertinent Gynecological History: Menses: post-menopausal Contraception: post menopausal status Blood transfusions: none Sexually transmitted diseases: HPV Previous GYN Procedures: H/O LEEP Last mammogram: normal  Last pap: normal    Menstrual History: No LMP recorded. Patient is postmenopausal.    Past Medical History:  Diagnosis Date  . Anxiety   . Cavus foot, acquired 09/09/2012    bilateral cavus feet the one on the left shows that she has some probable neurogenic weakness with abnormal curvature and in inversion contracture Podiatry 06/2015: H&P and x-ray reviewed with patient. Today I went ahead and I did a proximal nerve block I was able to aspirate the second MPJ and got out amount of clear fluid and injected with a quarter cc dexamethasone Kenalog and ap  . DDD (degenerative disc disease), lumbar   . Depression   . Endometrial polyp   . GERD (gastroesophageal reflux disease)   . Hiatal hernia   . Hip flexor tendinitis, right 07/02/2016  . History of adenomatous polyp of colon   . History of esophageal stricture   . History of gastric polyp 11/2017  . History of primary hyperparathyroidism    s/p  left parathryoidectomy 09-17-2008-- resolved  . Hyperlipidemia   . Hypertension   . Insomnia   . Lumbar stenosis   . Osteopenia   . Schatzki's ring   . Scoliosis   . Tendinopathy of right biceps tendon 07/02/2016  . Thickened endometrium   . Uterine fibroid   . Wears glasses     Past Surgical History:  Procedure Laterality Date  . BREAST REDUCTION SURGERY Bilateral 2000  . COLONOSCOPY    . CYST EXCISION  1985   head and rt axilla  . ECTOPIC PREGNANCY SURGERY  1981  .  PARATHYROIDECTOMY Left 09/17/2008   dr Ronnald Collum  @[facility]    left superior  (adenoma)  . SHOULDER SURGERY Right 2007   "frozen"  . UPPER GASTROINTESTINAL ENDOSCOPY  last one 12-18-2017   dr Rush Landmark    Family History  Problem Relation Age of Onset  . Heart disease Father 30  . Breast cancer Mother   . Hypertension Brother   . Hypertension Sister   . Breast cancer Maternal Aunt   . Colon cancer Neg Hx   . Esophageal cancer Neg Hx   . Stomach cancer Neg Hx   . Rectal cancer Neg Hx   . Colon polyps Neg Hx     Social History:  reports that she quit smoking about 27 years ago. Her smoking use included cigarettes. She quit after 15.00 years of use. She has never used smokeless tobacco. She reports previous alcohol use. She reports that she does not use drugs.  Allergies:  Allergies  Allergen Reactions  . Latex Itching and Rash  . Polysporin [Bacitracin-Polymyxin B] Rash    Medications Prior to Admission  Medication Sig Dispense Refill Last Dose  . atorvastatin (LIPITOR) 10 MG tablet Take 1 tablet (10 mg total) by mouth daily. Needs office visit prior to any additional refills. (Patient taking differently: Take 10 mg by mouth at bedtime. Needs office visit prior to any additional refills.) 90 tablet 3 08/12/2018 at 2000  . buPROPion (WELLBUTRIN XL) 150 MG 24  hr tablet Take 150 mg by mouth daily.    08/12/2018 at Unknown time  . FLUoxetine (PROZAC) 20 MG tablet Take 20 mg by mouth daily.   08/12/2018 at Unknown time  . losartan (COZAAR) 100 MG tablet Take 1 tablet (100 mg total) by mouth daily. (Patient taking differently: Take 100 mg by mouth at bedtime. ) 90 tablet 1 08/12/2018 at 2000  . zolpidem (AMBIEN) 5 MG tablet Take 2.5 mg by mouth at bedtime.    08/12/2018 at Unknown time  . valACYclovir (VALTREX) 500 MG tablet Take 500 mg by mouth as needed.    More than a month at Unknown time    REVIEW OF SYSTEMS: A ROS was performed and pertinent positives and negatives are included in the  history.  GENERAL: No fevers or chills. HEENT: No change in vision, no earache, sore throat or sinus congestion. NECK: No pain or stiffness. CARDIOVASCULAR: No chest pain or pressure. No palpitations. PULMONARY: No shortness of breath, cough or wheeze. GASTROINTESTINAL: No abdominal pain, nausea, vomiting or diarrhea, melena or bright red blood per rectum. GENITOURINARY: No urinary frequency, urgency, hesitancy or dysuria. MUSCULOSKELETAL: No joint or muscle pain, no back pain, no recent trauma. DERMATOLOGIC: No rash, no itching, no lesions. ENDOCRINE: No polyuria, polydipsia, no heat or cold intolerance. No recent change in weight. HEMATOLOGICAL: No anemia or easy bruising or bleeding. NEUROLOGIC: No headache, seizures, numbness, tingling or weakness. PSYCHIATRIC: No depression, no loss of interest in normal activity or change in sleep pattern.     Blood pressure 130/76, pulse 63, temperature 98.2 F (36.8 C), temperature source Oral, resp. rate 16, height 5\' 2"  (1.575 m), weight 73.9 kg, SpO2 98 %.  Physical Exam:  See office notes  Sono Infusion Hysterogram ( procedure note)   The initial transvaginal ultrasound demonstrated the following: T/V images. Uterus unchanged since May 29th 2020 ultrasound. Pedunculated fibroid unchanged at 5.6 cm. Endometrial lining remains thickened at 9.3 mm. Both ovaries are small with atrophic appearance. No free fluid in the posterior cutis sac.   The speculum was inserted and the cervix cleansed with Betadine solution after confirming that patient has no allergies.A small sonohysterography catheterwas utilized. Insertion was facilitated with ring forceps, using a spear-like motion the catheter was inserted to the fundus of the uterus. The speculum is then removed carefully to avoid dislodging the catheter. The catheter was flushed with sterile saline delete prior to insertion to rid it of small amounts of air.the sterile saline solution was infused  into the uterine cavity as a vaginal ultrasound probe was then placed in the vagina for full visualization of the uterine cavity from a transvaginal approach. The following was noted: The endometrial cavity is filled with saline, a 1.8 x 1.0 cm endometrial mass is present.  The catheter was then removed after retrieving some of the saline from the intrauterine cavity. An endometrial biopsywas notdone. Patient tolerated procedure well. She had received a tablet of Aleve for discomfort.    Assessment/Plan:67 y.[id number]   1. Thickened endometrium Endometrial Polyp confirmed by Sonohysto measuring 1.8 x 1.0 cm.Sonohysterogram findings reviewed with patient. Patient informed about the endometrial polyp and management. We will proceed with a hysteroscopy with MyoSure excision of the endometrial polyp and D&C. Surgical procedure thoroughly reviewed and discussed including risks of trauma/uterine perforation, infection, hemorrhage, anesthesia complications. Preop and postop management reviewed.  2. Endometrial polyp As above.  Marie-Lyne Kamryn Messineo 08/13/2018, 8:44 AM

## 2018-08-13 NOTE — Transfer of Care (Signed)
Immediate Anesthesia Transfer of Care Note  Patient: Rebecca Long  Procedure(s) Performed: DILATATION & CURETTAGE/HYSTEROSCOPY WITH MYOSURE (N/A )  Patient Location: PACU  Anesthesia Type:General  Level of Consciousness: drowsy and patient cooperative  Airway & Oxygen Therapy: Patient Spontanous Breathing and Patient connected to nasal cannula oxygen  Post-op Assessment: Report given to RN and Post -op Vital signs reviewed and stable  Post vital signs: Reviewed and stable  Last Vitals:  Vitals Value Taken Time  BP 102/56 08/13/18 1048  Temp 36.4 C 08/13/18 1048  Pulse 66 08/13/18 1055  Resp 14 08/13/18 1055  SpO2 97 % 08/13/18 1055  Vitals shown include unvalidated device data.  Last Pain:  Vitals:   08/13/18 1048  TempSrc:   PainSc: Asleep      Patients Stated Pain Goal: 3 (18/59/09 3112)  Complications: No apparent anesthesia complications

## 2018-08-13 NOTE — Op Note (Addendum)
Operative Note  08/13/2018  10:41 AM  PATIENT:  Rebecca Long  68 y.o. female  PRE-OPERATIVE DIAGNOSIS:  Thickened Endometrium/Probable Polyp  POST-OPERATIVE DIAGNOSIS: Endometrial scarring/Septum  PROCEDURE:  Procedure(s): HYSTEROSCOPY WITH D+C  SURGEON:  Surgeon(s): Princess Bruins, MD  ANESTHESIA:   general with laryngeal mask  FINDINGS: Intra-uterine scarring with adhesions/septum  DESCRIPTION OF OPERATION: Under general anesthesia with laryngeal mask the patient is in lithotomy position.  She is prepped with Betadine on the suprapubic, vulvar and vaginal areas. The bladder is catheterized.  The patient is draped as usual.  The vaginal exam reveals an anteverted uterus, normal volume, mobile.  No adnexal mass.  The speculum is inserted in the vagina and the anterior lip of the cervix is grasped with a tenaculum.  A paracervical block is done with lidocaine 1% a total of 20 cc at 4 and 8:00.  Dilation of the cervix with Pratt dilators up to #19 without difficulty.  Insertion of the hysteroscope in the intrauterine cavity.  Inspection reveals scar tissue throughout the cavity with a thick anteroposterior midline adhesion/septum.  Both ostia are seen on either side of that midline adhesion/septum.  No other intrauterine lesion is seen.  Pictures are taken.  The hysteroscope was removed.  An endometrial curettage is done with the sharp curette on all intrauterine surfaces.  The specimen is sent to pathology.  Hemostasis is adequate.  All instruments are removed.  The patient is brought to recovery room in good and stable status.  ESTIMATED BLOOD LOSS: 5 mL FLUID DEFICIT 450 cc  Intake/Output Summary (Last 24 hours) at 08/13/2018 1041 Last data filed at 08/13/2018 1024 Gross per 24 hour  Intake -  Output 5 ml  Net -5 ml     BLOOD ADMINISTERED:none   LOCAL MEDICATIONS USED:  LIDOCAINE 1% 20 cc Paracervical block  SPECIMEN:  Source of Specimen:  Endometrial  curretings  DISPOSITION OF SPECIMEN:  PATHOLOGY  COUNTS:  YES  PLAN OF CARE: Transfer to PACU  Marie-Lyne LavoieMD10:41 AM

## 2018-08-13 NOTE — Anesthesia Procedure Notes (Signed)
Procedure Name: LMA Insertion Date/Time: 08/13/2018 10:14 AM Performed by: Gwyndolyn Saxon, CRNA Pre-anesthesia Checklist: Patient identified, Emergency Drugs available, Suction available and Patient being monitored Patient Re-evaluated:Patient Re-evaluated prior to induction Oxygen Delivery Method: Circle system utilized Preoxygenation: Pre-oxygenation with 100% oxygen Induction Type: IV induction Ventilation: Mask ventilation without difficulty LMA: LMA inserted LMA Size: 3.0 Number of attempts: 2 (LMA#4 unable to pass gently) Placement Confirmation: positive ETCO2 and breath sounds checked- equal and bilateral Tube secured with: Tape Dental Injury: Teeth and Oropharynx as per pre-operative assessment

## 2018-08-13 NOTE — Anesthesia Postprocedure Evaluation (Signed)
Anesthesia Post Note  Patient: Jalayla Chrismer Cassin  Procedure(s) Performed: DILATATION & CURETTAGE/HYSTEROSCOPY WITH MYOSURE (N/A )     Patient location during evaluation: PACU Anesthesia Type: General Level of consciousness: awake and alert Pain management: pain level controlled Vital Signs Assessment: post-procedure vital signs reviewed and stable Respiratory status: spontaneous breathing, nonlabored ventilation and respiratory function stable Cardiovascular status: blood pressure returned to baseline and stable Postop Assessment: no apparent nausea or vomiting Anesthetic complications: no    Last Vitals:  Vitals:   08/13/18 1130 08/13/18 1200  BP: 110/67 123/70  Pulse: (!) 58 62  Resp: 14 16  Temp:  (!) 36.4 C  SpO2: 93% 96%    Last Pain:  Vitals:   08/13/18 1200  TempSrc:   PainSc: 0-No pain                 Lynda Rainwater

## 2018-08-13 NOTE — Discharge Instructions (Addendum)
Hysteroscopy, Care After This sheet gives you information about how to care for yourself after your procedure. Your health care provider may also give you more specific instructions. If you have problems or questions, contact your health care provider. What can I expect after the procedure? After the procedure, it is common to have:  Cramping.  Bleeding. This can vary from light spotting to menstrual-like bleeding. Follow these instructions at home: Activity  Rest for 1-2 days after the procedure.  Do not douche, use tampons, or have sex for 2 weeks after the procedure, or until your health care provider approves.  Do not drive for 24 hours after the procedure, or for as long as told by your health care provider.  Do not drive, use heavy machinery, or drink alcohol while taking prescription pain medicines. Medicines   Take over-the-counter and prescription medicines only as told by your health care provider.  Do not take aspirin during recovery. It can increase the risk of bleeding.  General instructions  Do not take baths, swim, or use a hot tub until your health care provider approves. Take showers instead of baths for 2 weeks, or for as long as told by your health care provider.  To prevent or treat constipation while you are taking prescription pain medicine, your health care provider may recommend that you: ? Drink enough fluid to keep your urine clear or pale yellow. ? Take over-the-counter or prescription medicines. ? Eat foods that are high in fiber, such as fresh fruits and vegetables, whole grains, and beans. ? Limit foods that are high in fat and processed sugars, such as fried and sweet foods.  Keep all follow-up visits as told by your health care provider. This is important. Contact a health care provider if:  You feel dizzy or lightheaded.  You feel nauseous.  You have abnormal vaginal discharge.  You have a rash.  You have pain that does not get better  with medicine.  You have chills. Get help right away if:  You have bleeding that is heavier than a normal menstrual period.  You have a fever.  You have pain or cramps that get worse.  You develop new abdominal pain.  You faint.  You have pain in your shoulders.  You have shortness of breath. Summary  After the procedure, you may have cramping and some vaginal bleeding.  Do not douche, use tampons, or have sex for 2 weeks after the procedure, or until your health care provider approves.  Do not take baths, swim, or use a hot tub until your health care provider approves. Take showers instead of baths for 2 weeks, or for as long as told by your health care provider.  Report any unusual symptoms to your health care provider.  Keep all follow-up visits as told by your health care provider. This is important. This information is not intended to replace advice given to you by your health care provider. Make sure you discuss any questions you have with your health care provider. Document Released: 11/05/2012 Document Revised: 12/28/2016 Document Reviewed: 02/14/2016 Elsevier Patient Education  [place] until after 6:30pm  Post Anesthesia Home Care Instructions  Activity: Get plenty of rest for the remainder of the day. A responsible individual must stay with you for 24 hours following the procedure.  For the next 24 hours, DO NOT: -Drive a car -Paediatric nurse -Drink alcoholic beverages -Take any medication unless instructed by your physician -Make any legal decisions or  sign important papers.  Meals: Start with liquid foods such as gelatin or soup. Progress to regular foods as tolerated. Avoid greasy, spicy, heavy foods. If nausea and/or vomiting occur, drink only clear liquids until the nausea and/or vomiting subsides. Call your physician if vomiting continues.  Special Instructions/Symptoms: Your throat may feel dry or sore from the  anesthesia or the breathing tube placed in your throat during surgery. If this causes discomfort, gargle with warm salt water. The discomfort should disappear within 24 hours.  If you had a scopolamine patch placed behind your ear for the management of post- operative nausea and/or vomiting:  1. The medication in the patch is effective for 72 hours, after which it should be removed.  Wrap patch in a tissue and discard in the trash. Wash hands thoroughly with soap and water. 2. You may remove the patch earlier than 72 hours if you experience unpleasant side effects which may include dry mouth, dizziness or visual disturbances. 3. Avoid touching the patch. Wash your hands with soap and water after contact with the patch.

## 2018-08-13 NOTE — Anesthesia Preprocedure Evaluation (Signed)
Anesthesia Evaluation  Patient identified by MRN, date of birth, ID band Patient awake    Reviewed: Allergy & Precautions, NPO status , Patient's Chart, lab work & pertinent test results  Airway Mallampati: II  TM Distance: >3 FB Neck ROM: Full    Dental no notable dental hx.    Pulmonary neg pulmonary ROS, former smoker,    Pulmonary exam normal breath sounds clear to auscultation       Cardiovascular hypertension, Pt. on medications negative cardio ROS Normal cardiovascular exam Rhythm:Regular Rate:Normal     Neuro/Psych Anxiety Depression negative neurological ROS  negative psych ROS   GI/Hepatic Neg liver ROS, GERD  ,  Endo/Other  negative endocrine ROS  Renal/GU negative Renal ROS  negative genitourinary   Musculoskeletal  (+) Arthritis , Osteoarthritis,    Abdominal   Peds negative pediatric ROS (+)  Hematology negative hematology ROS (+)   Anesthesia Other Findings   Reproductive/Obstetrics negative OB ROS                             Anesthesia Physical Anesthesia Plan  ASA: II  Anesthesia Plan: General   Post-op Pain Management:    Induction: Intravenous  PONV Risk Score and Plan: 3 and Ondansetron, Dexamethasone, Midazolam and Treatment may vary due to age or medical condition  Airway Management Planned: LMA  Additional Equipment:   Intra-op Plan:   Post-operative Plan: Extubation in OR  Informed Consent: I have reviewed the patients History and Physical, chart, labs and discussed the procedure including the risks, benefits and alternatives for the proposed anesthesia with the patient or authorized representative who has indicated his/her understanding and acceptance.     Dental advisory given  Plan Discussed with: CRNA  Anesthesia Plan Comments:         Anesthesia Quick Evaluation

## 2018-08-15 ENCOUNTER — Encounter (HOSPITAL_BASED_OUTPATIENT_CLINIC_OR_DEPARTMENT_OTHER): Payer: Self-pay | Admitting: Obstetrics & Gynecology

## 2018-08-15 ENCOUNTER — Telehealth: Payer: Self-pay

## 2018-08-15 ENCOUNTER — Telehealth: Payer: Self-pay | Admitting: *Deleted

## 2018-08-15 NOTE — Telephone Encounter (Signed)
Patient advised. Appt cancelled.

## 2018-08-15 NOTE — Telephone Encounter (Signed)
Can be pregnancy, infection, D+C.  Could also be congenital with a septum.Marland KitchenMarland Kitchen

## 2018-08-15 NOTE — Telephone Encounter (Signed)
Patient had D&C on 08/13/18 states you spoke with her husband and told her that you found scar tissue on uterus? Patient asked how does scar tissue form on uterus? Please advise

## 2018-08-15 NOTE — Telephone Encounter (Signed)
-----   Message from Princess Bruins, MD sent at 08/14/2018  8:56 PM EDT ----- Patho benign.  Scarred cavity, no endometrium.  Diagnosis Endometrium, curettage - PREDOMINANTLY BLOOD WITH FRAGMENTS OF BENIGN SQUAMOUS EPITHELIUM - NO DEFINITIVE ENDOMETRIUM IDENTIFIED

## 2018-08-15 NOTE — Telephone Encounter (Signed)
Patient wants to know if it is necessary that she return for a follow up visit?

## 2018-08-15 NOTE — Telephone Encounter (Signed)
If she has no pelvic  pain, no abnormal vaginal discharge, no fever and no question, she doesn't need to have a postop visit.  She can do a televisit if she justs wants to discuss the findings.

## 2018-08-20 ENCOUNTER — Telehealth: Payer: Self-pay | Admitting: Gastroenterology

## 2018-08-20 NOTE — Telephone Encounter (Signed)
Patient answered "NO" to all Covid-19 questions.

## 2018-08-20 NOTE — Telephone Encounter (Signed)
Patient informed. 

## 2018-08-20 NOTE — Telephone Encounter (Signed)

## 2018-08-21 ENCOUNTER — Other Ambulatory Visit: Payer: Self-pay

## 2018-08-21 ENCOUNTER — Ambulatory Visit (AMBULATORY_SURGERY_CENTER): Payer: Medicare HMO | Admitting: Gastroenterology

## 2018-08-21 ENCOUNTER — Encounter: Payer: Self-pay | Admitting: Gastroenterology

## 2018-08-21 VITALS — BP 97/58 | HR 57 | Temp 97.7°F | Resp 16 | Ht 62.2 in | Wt 162.0 lb

## 2018-08-21 DIAGNOSIS — D125 Benign neoplasm of sigmoid colon: Secondary | ICD-10-CM

## 2018-08-21 DIAGNOSIS — R1310 Dysphagia, unspecified: Secondary | ICD-10-CM | POA: Diagnosis not present

## 2018-08-21 DIAGNOSIS — K210 Gastro-esophageal reflux disease with esophagitis: Secondary | ICD-10-CM

## 2018-08-21 DIAGNOSIS — D123 Benign neoplasm of transverse colon: Secondary | ICD-10-CM

## 2018-08-21 DIAGNOSIS — K222 Esophageal obstruction: Secondary | ICD-10-CM

## 2018-08-21 DIAGNOSIS — K317 Polyp of stomach and duodenum: Secondary | ICD-10-CM

## 2018-08-21 DIAGNOSIS — K449 Diaphragmatic hernia without obstruction or gangrene: Secondary | ICD-10-CM | POA: Diagnosis not present

## 2018-08-21 DIAGNOSIS — D122 Benign neoplasm of ascending colon: Secondary | ICD-10-CM | POA: Diagnosis not present

## 2018-08-21 DIAGNOSIS — Z86010 Personal history of colonic polyps: Secondary | ICD-10-CM

## 2018-08-21 DIAGNOSIS — R1319 Other dysphagia: Secondary | ICD-10-CM

## 2018-08-21 MED ORDER — OMEPRAZOLE 40 MG PO CPDR: 40.0000 mg | DELAYED_RELEASE_CAPSULE | Freq: Every day | ORAL | 4 refills | Status: DC

## 2018-08-21 MED ORDER — SODIUM CHLORIDE 0.9 % IV SOLN: 500.0000 mL | Freq: Once | INTRAVENOUS | Status: DC

## 2018-08-21 NOTE — Progress Notes (Signed)
Patient had D&C on July 15 at [facility].

## 2018-08-21 NOTE — Progress Notes (Signed)
PT taken to PACU. Monitors in place. VSS. Report given to RN. 

## 2018-08-21 NOTE — Op Note (Signed)
[place] Patient Name: Rebecca Long Procedure Date: 08/21/2018 9:53 AM MRN: [id number] Endoscopist: Ladene Artist , MD Age: 68 Referring MD:  Date of Birth: 1950/07/17 Gender: Female Account #: [id number] Procedure:                Upper GI endoscopy Indications:              Dysphagia Medicines:                Monitored Anesthesia Care Procedure:                Pre-Anesthesia Assessment:                           - Prior to the procedure, a History and Physical                            was performed, and patient medications and                            allergies were reviewed. The patient's tolerance of                            previous anesthesia was also reviewed. The risks                            and benefits of the procedure and the sedation                            options and risks were discussed with the patient.                            All questions were answered, and informed consent                            was obtained. Prior Anticoagulants: The patient has                            taken no previous anticoagulant or antiplatelet                            agents. ASA Grade Assessment: II - A patient with                            mild systemic disease. After reviewing the risks                            and benefits, the patient was deemed in                            satisfactory condition to undergo the procedure.                           After obtaining informed consent, the endoscope was  passed under direct vision. Throughout the                            procedure, the patient's blood pressure, pulse, and                            oxygen saturations were monitored continuously. The                            Endoscope was introduced through the mouth, and                            advanced to the second part of duodenum. The upper                            GI endoscopy was accomplished without  difficulty.                            The patient tolerated the procedure well. Scope In: Scope Out: Findings:                 LA Grade C (one or more mucosal breaks continuous                            between tops of 2 or more mucosal folds, less than                            75% circumference) esophagitis with no bleeding was                            found in the distal esophagus.                           One benign-appearing, intrinsic moderate stenosis                            was found 32 cm from the incisors. This stenosis                            measured 1.2 cm (inner diameter). The stenosis was                            traversed. A guidewire was placed and the scope was                            withdrawn. Dilations were performed with Savary                            dilators with mild resistance at 13 mm, 14 mm and                            15 mm. No heme noted  The exam of the esophagus was otherwise normal.                           Localized mild inflammation characterized by                            erythema and granularity was found in the gastric                            antrum.                           A medium-sized hiatal hernia was present.                           Multiple small sessile polyps with no stigmata of                            recent bleeding were found in the gastric body.                           The exam of the stomach was otherwise normal.                           Patchy mildly erythematous mucosa without active                            bleeding and with no stigmata of bleeding was found                            in the duodenal bulb.                           The second portion of the duodenum was normal. Complications:            No immediate complications. Estimated Blood Loss:     Estimated blood loss: none. Impression:               - LA Grade C reflux esophagitis.                            - Benign-appearing esophageal stenosis. Dilated.                           - Gastritis.                           - Medium-sized hiatal hernia.                           - Multiple gastric polyps.                           - Erythematous duodenopathy.                           - Normal second portion of the duodenum.                           -  No specimens collected. Recommendation:           - Patient has a contact number available for                            emergencies. The signs and symptoms of potential                            delayed complications were discussed with the                            patient. Return to normal activities tomorrow.                            Written discharge instructions were provided to the                            patient.                           - Clear liquid diet for 2 hours, then advance as                            tolerated to soft diet today.                           - Resume prior diet tomorrow.                           - Antireflux measures indefinitely.                           - Continue present medications.                           - Return to GI office in 6-8 weeks.                           - Prilosec (omeprazole) 40 mg PO daily                            indefinitely, 1 year of refills. Ladene Artist, MD 08/21/2018 11:00:25 AM This report has been signed electronically.

## 2018-08-21 NOTE — Patient Instructions (Signed)
Thank you for allowing [place] to participate in your care today!  Await pathology results by mail, approximately 2 weeks.  Modified diet today.  Clear liquids for 2 hours, then advance to soft diet for the remainder of the day today.  Resume previous diet tomorrow.  New prescription:  Prilosec 40 mg by mouth daily.  This has been sent to your pharmacy.  Follow up in GI clinic in 6-8 weeks.  We will call to arrange this appointment.  Recommend high fiber diet and follow anti-reflux measures.  Handouts provided.  Avoid all Aspirin, Ibuprofen, Naproxyn, or other non-steroidal anti-inflammatory drugs for 2 weeks.      YOU HAD AN ENDOSCOPIC PROCEDURE TODAY AT [facility]:   Refer to the procedure report that was given to you for any specific questions about what was found during the examination.  If the procedure report does not answer your questions, please call your gastroenterologist to clarify.  If you requested that your care partner not be given the details of your procedure findings, then the procedure report has been included in a sealed envelope for you to review at your convenience later.  YOU SHOULD EXPECT: Some feelings of bloating in the abdomen. Passage of more gas than usual.  Walking can help get rid of the air that was put into your GI tract during the procedure and reduce the bloating. If you had a lower endoscopy (such as a colonoscopy or flexible sigmoidoscopy) you may notice spotting of blood in your stool or on the toilet paper. If you underwent a bowel prep for your procedure, you may not have a normal bowel movement for a few days.  Please Note:  You might notice some irritation and congestion in your nose or some drainage.  This is from the oxygen used during your procedure.  There is no need for concern and it should clear up in a day or so.  SYMPTOMS TO REPORT IMMEDIATELY:   Following lower endoscopy (colonoscopy or flexible sigmoidoscopy):  Excessive  amounts of blood in the stool  Significant tenderness or worsening of abdominal pains  Swelling of the abdomen that is new, acute  Fever of 100F or higher   Following upper endoscopy (EGD)  Vomiting of blood or coffee ground material  New chest pain or pain under the shoulder blades  Painful or persistently difficult swallowing  New shortness of breath  Fever of 100F or higher  Black, tarry-looking stools  For urgent or emergent issues, a gastroenterologist can be reached at any hour by calling [phone].   DIET:  We do recommend a small meal at first, but then you may proceed to your regular diet.  Drink plenty of fluids but you should avoid alcoholic beverages for 24 hours.  ACTIVITY:  You should plan to take it easy for the rest of today and you should NOT DRIVE or use heavy machinery until tomorrow (because of the sedation medicines used during the test).    FOLLOW UP: Our staff will call the number listed on your records 48-72 hours following your procedure to check on you and address any questions or concerns that you may have regarding the information given to you following your procedure. If we do not reach you, we will leave a message.  We will attempt to reach you two times.  During this call, we will ask if you have developed any symptoms of COVID 19. If you develop any symptoms (ie: fever, flu-like symptoms, shortness of  breath, cough etc.) before then, please call [phone].  If you test positive for Covid 19 in the 2 weeks post procedure, please call and report this information to [place].    If any biopsies were taken you will be contacted by phone or by letter within the next 1-3 weeks.  Please call us at [phone] if you have not heard about the biopsies in 3 weeks.    SIGNATURES/CONFIDENTIALITY: You and/or your care partner have signed paperwork which will be entered into your electronic medical record.  These signatures attest to the fact that that the  information above on your After Visit Summary has been reviewed and is understood.  Full responsibility of the confidentiality of this discharge information lies with you and/or your care-partner.YOU HAD AN ENDOSCOPIC PROCEDURE TODAY AT [facility]:   Refer to the procedure report that was given to you for any specific questions about what was found during the examination.  If the procedure report does not answer your questions, please call your gastroenterologist to clarify.  If you requested that your care partner not be given the details of your procedure findings, then the procedure report has been included in a sealed envelope for you to review at your convenience later.  YOU SHOULD EXPECT: Some feelings of bloating in the abdomen. Passage of more gas than usual.  Walking can help get rid of the air that was put into your GI tract during the procedure and reduce the bloating. If you had a lower endoscopy (such as a colonoscopy or flexible sigmoidoscopy) you may notice spotting of blood in your stool or on the toilet paper. If you underwent a bowel prep for your procedure, you may not have a normal bowel movement for a few days.  Please Note:  You might notice some irritation and congestion in your nose or some drainage.  This is from the oxygen used during your procedure.  There is no need for concern and it should clear up in a day or so.  SYMPTOMS TO REPORT IMMEDIATELY:   Following lower endoscopy (colonoscopy or flexible sigmoidoscopy):  Excessive amounts of blood in the stool  Significant tenderness or worsening of abdominal pains  Swelling of the abdomen that is new, acute  Fever of 100F or higher   Following upper endoscopy (EGD)  Vomiting of blood or coffee ground material  New chest pain or pain under the shoulder blades  Painful or persistently difficult swallowing  New shortness of breath  Fever of 100F or higher  Black, tarry-looking stools  For urgent or  emergent issues, a gastroenterologist can be reached at any hour by calling [phone].   DIET:  We do recommend a small meal at first, but then you may proceed to your regular diet.  Drink plenty of fluids but you should avoid alcoholic beverages for 24 hours.  ACTIVITY:  You should plan to take it easy for the rest of today and you should NOT DRIVE or use heavy machinery until tomorrow (because of the sedation medicines used during the test).    FOLLOW UP: Our staff will call the number listed on your records 48-72 hours following your procedure to check on you and address any questions or concerns that you may have regarding the information given to you following your procedure. If we do not reach you, we will leave a message.  We will attempt to reach you two times.  During this call, we will ask if you have  developed any symptoms of COVID 19. If you develop any symptoms (ie: fever, flu-like symptoms, shortness of breath, cough etc.) before then, please call [phone].  If you test positive for Covid 19 in the 2 weeks post procedure, please call and report this information to [place].    If any biopsies were taken you will be contacted by phone or by letter within the next 1-3 weeks.  Please call us at [phone] if you have not heard about the biopsies in 3 weeks.    SIGNATURES/CONFIDENTIALITY: You and/or your care partner have signed paperwork which will be entered into your electronic medical record.  These signatures attest to the fact that that the information above on your After Visit Summary has been reviewed and is understood.  Full responsibility of the confidentiality of this discharge information lies with you and/or your care-partner.

## 2018-08-21 NOTE — Progress Notes (Signed)
Called to room to assist during endoscopic procedure.  Patient ID and intended procedure confirmed with present staff. Received instructions for my participation in the procedure from the performing physician.  

## 2018-08-21 NOTE — Progress Notes (Signed)
Riki Sheer took temp and Edison took vitals.

## 2018-08-21 NOTE — Op Note (Signed)
[place] Patient Name: Rebecca Long Procedure Date: 08/21/2018 9:54 AM MRN: [id number] Endoscopist: Ladene Artist , MD Age: 68 Referring MD:  Date of Birth: Oct 05, 1950 Gender: Female Account #: [id number] Procedure:                Colonoscopy Indications:              Surveillance: Personal history of adenomatous                            polyps on last colonoscopy 5 years ago Medicines:                Monitored Anesthesia Care Procedure:                Pre-Anesthesia Assessment:                           - Prior to the procedure, a History and Physical                            was performed, and patient medications and                            allergies were reviewed. The patient's tolerance of                            previous anesthesia was also reviewed. The risks                            and benefits of the procedure and the sedation                            options and risks were discussed with the patient.                            All questions were answered, and informed consent                            was obtained. Prior Anticoagulants: The patient has                            taken no previous anticoagulant or antiplatelet                            agents. ASA Grade Assessment: II - A patient with                            mild systemic disease. After reviewing the risks                            and benefits, the patient was deemed in                            satisfactory condition to undergo the procedure.  After obtaining informed consent, the colonoscope                            was passed under direct vision. Throughout the                            procedure, the patient's blood pressure, pulse, and                            oxygen saturations were monitored continuously. The                            Endoscope was introduced through the anus and                            advanced to the the cecum,  identified by                            appendiceal orifice and ileocecal valve. The                            ileocecal valve, appendiceal orifice, and rectum                            were photographed. The quality of the bowel                            preparation was good. The colonoscopy was somewhat                            difficult due to restricted mobility of the colon                            and a tortuous colon in the sigmoid colon.                            Successful completion of the procedure was aided by                            using manual pressure, withdrawing the adult                            colonoscope and replacing with the pediatric                            colonoscope, withdrawing the pediatric colonoscope                            and replacing with the adult endoscope,                            straightening and shortening the scope to obtain  bowel loop reduction and using scope torsion. The                            procedure was completed with the adult endoscope.                            The patient tolerated the procedure fairly well. Scope In: 10:25:37 AM Scope Out: 10:52:41 AM Scope Withdrawal Time: 0 hours 18 minutes 41 seconds  Total Procedure Duration: 0 hours 27 minutes 4 seconds  Findings:                 The perianal and digital rectal examinations were                            normal.                           A 10 mm polyp was found in the ascending colon. The                            polyp was flat. The polyp was removed with a hot                            snare. Resection and retrieval were complete.                           A 16 mm polyp was found in the ascending colon. The                            polyp was sessile. The polyp was removed with a hot                            snare. Resection and retrieval were complete.                           Two sessile polyps were found in the  transverse                            colon. The polyps were 5 mm in size. These polyps                            were removed with a cold biopsy forceps. Resection                            and retrieval were complete.                           A 7 mm polyp was found in the sigmoid colon. The                            polyp was sessile. The polyp was removed with a  cold snare. Resection and retrieval were complete.                           Many small-mouthed diverticula were found in the                            left colon. There was no evidence of diverticular                            bleeding.                           The exam was otherwise without abnormality on                            direct and retroflexion views. Complications:            No immediate complications. Estimated blood loss:                            None. Estimated Blood Loss:     Estimated blood loss: none. Impression:               - One 10 mm polyp in the ascending colon, removed                            with a hot snare. Resected and retrieved.                           - One 16 mm polyp in the ascending colon, removed                            with a hot snare. Resected and retrieved.                           - Two 5 mm polyps in the transverse colon, removed                            with a cold biopsy forceps. Resected and retrieved.                           - One 7 mm polyp in the sigmoid colon, removed with                            a cold snare. Resected and retrieved.                           - Mild diverticulosis in the left colon.                           - The examination was otherwise normal on direct                            and retroflexion views. Recommendation:           -  Repeat colonoscopy likely in 3 years for                            surveillance based on pathology results.                           - Patient has a contact number available for                             emergencies. The signs and symptoms of potential                            delayed complications were discussed with the                            patient. Return to normal activities tomorrow.                            Written discharge instructions were provided to the                            patient.                           - High fiber diet.                           - Continue present medications.                           - Await pathology results.                           - No aspirin, ibuprofen, naproxen, or other                            non-steroidal anti-inflammatory drugs for 2 weeks                            after polyp removal. Ladene Artist, MD 08/21/2018 11:08:54 AM This report has been signed electronically.

## 2018-08-25 ENCOUNTER — Telehealth: Payer: Self-pay

## 2018-08-25 NOTE — Telephone Encounter (Signed)
  Follow up Call-  Call back number 08/21/2018 12/18/2017  Post procedure Call Back phone  # [phone]  Permission to leave phone message Yes Yes  Some recent data might be hidden     Patient questions:  Do you have a fever, pain , or abdominal swelling? No. Pain Score  0 *  Have you tolerated food without any problems? Yes.    Have you been able to return to your normal activities? Yes.    Do you have any questions about your discharge instructions: Diet   No. Medications  No. Follow up visit  No.  Do you have questions or concerns about your Care? No.  Actions: * If pain score is 4 or above: No action needed, pain <4.  1. Have you developed a fever since your procedure? no  2.   Have you had an respiratory symptoms (SOB or cough) since your procedure? no  3.   Have you tested positive for COVID 19 since your procedure no  4.   Have you had any family members/close contacts diagnosed with the COVID 19 since your procedure?  no   If yes to any of these questions please route to Joylene John, RN and Alphonsa Gin, Therapist, sports.

## 2018-08-27 ENCOUNTER — Encounter: Payer: Self-pay | Admitting: Gastroenterology

## 2018-09-01 ENCOUNTER — Ambulatory Visit: Payer: Medicare HMO | Admitting: Obstetrics & Gynecology

## 2018-09-15 DIAGNOSIS — R69 Illness, unspecified: Secondary | ICD-10-CM | POA: Diagnosis not present

## 2018-09-25 ENCOUNTER — Telehealth: Payer: Self-pay | Admitting: Gastroenterology

## 2018-09-25 NOTE — Telephone Encounter (Signed)
Patient reports that she has had one episode of bright rectal bleeding.  She is s/p colonoscopy 08/21/18 with polypectomy.  She is advised most likely hemorrhoidal bleeding.  She is advised to remain on a high fiber diet with liberal fluid intake.  She is asked to try hydrocortisone cream OTC PRN.  She is asked to call back for any additional questions or concerns.

## 2018-09-25 NOTE — Telephone Encounter (Signed)
Pt reported that she is having blood in stool post colon.  Please advice.

## 2018-10-07 ENCOUNTER — Ambulatory Visit
Admission: RE | Admit: 2018-10-07 | Discharge: 2018-10-07 | Disposition: A | Discharge status: Home / Self Care | Payer: Medicare HMO | Source: Ambulatory Visit | Attending: Family Medicine | Admitting: Family Medicine

## 2018-10-07 ENCOUNTER — Other Ambulatory Visit: Payer: Self-pay

## 2018-10-07 DIAGNOSIS — M8589 Other specified disorders of bone density and structure, multiple sites: Secondary | ICD-10-CM | POA: Diagnosis not present

## 2018-10-07 DIAGNOSIS — Z780 Asymptomatic menopausal state: Secondary | ICD-10-CM | POA: Diagnosis not present

## 2018-10-07 DIAGNOSIS — M8580 Other specified disorders of bone density and structure, unspecified site: Secondary | ICD-10-CM

## 2018-10-08 ENCOUNTER — Telehealth: Payer: Self-pay | Admitting: Family Medicine

## 2018-10-08 NOTE — Telephone Encounter (Signed)
Please inform patient the following information: Bone density resulted with osteopenia with a score of -1.8.This is unchanged from prior score.  Continue weightbearing exercises, adequate calcium 1200 mg and vitamin D 1000 units intake or supplementation recommended-as long as not told to avoid calcium used in the past..  Commendations are to repeat scan every 2-3 years to ensure not progressing to osteoporosis.

## 2018-10-09 NOTE — Telephone Encounter (Signed)
Pt was called and given results and instructions, she verbalized understanding

## 2018-10-09 NOTE — Telephone Encounter (Signed)
Pt was left message to return call

## 2018-12-16 DIAGNOSIS — L821 Other seborrheic keratosis: Secondary | ICD-10-CM | POA: Diagnosis not present

## 2018-12-16 DIAGNOSIS — B009 Herpesviral infection, unspecified: Secondary | ICD-10-CM | POA: Diagnosis not present

## 2018-12-16 DIAGNOSIS — D1801 Hemangioma of skin and subcutaneous tissue: Secondary | ICD-10-CM | POA: Diagnosis not present

## 2018-12-16 DIAGNOSIS — L814 Other melanin hyperpigmentation: Secondary | ICD-10-CM | POA: Diagnosis not present

## 2018-12-16 DIAGNOSIS — D225 Melanocytic nevi of trunk: Secondary | ICD-10-CM | POA: Diagnosis not present

## 2018-12-16 DIAGNOSIS — D2262 Melanocytic nevi of left upper limb, including shoulder: Secondary | ICD-10-CM | POA: Diagnosis not present

## 2018-12-16 DIAGNOSIS — Z85828 Personal history of other malignant neoplasm of skin: Secondary | ICD-10-CM | POA: Diagnosis not present

## 2019-01-01 DIAGNOSIS — F3341 Major depressive disorder, recurrent, in partial remission: Secondary | ICD-10-CM | POA: Diagnosis not present

## 2019-01-01 DIAGNOSIS — R69 Illness, unspecified: Secondary | ICD-10-CM | POA: Diagnosis not present

## 2019-01-12 ENCOUNTER — Encounter: Payer: Self-pay | Admitting: Family Medicine

## 2019-01-12 ENCOUNTER — Other Ambulatory Visit: Payer: Self-pay

## 2019-01-12 ENCOUNTER — Ambulatory Visit (INDEPENDENT_AMBULATORY_CARE_PROVIDER_SITE_OTHER): Payer: Medicare HMO | Admitting: Family Medicine

## 2019-01-12 VITALS — BP 133/84 | Ht 62.0 in

## 2019-01-12 DIAGNOSIS — R69 Illness, unspecified: Secondary | ICD-10-CM | POA: Diagnosis not present

## 2019-01-12 DIAGNOSIS — I10 Essential (primary) hypertension: Secondary | ICD-10-CM

## 2019-01-12 DIAGNOSIS — E785 Hyperlipidemia, unspecified: Secondary | ICD-10-CM

## 2019-01-12 DIAGNOSIS — F418 Other specified anxiety disorders: Secondary | ICD-10-CM

## 2019-01-12 MED ORDER — LOSARTAN POTASSIUM 100 MG PO TABS: 100.0000 mg | ORAL_TABLET | Freq: Every day | ORAL | 1 refills | Status: DC

## 2019-01-12 NOTE — Progress Notes (Signed)
VIRTUAL VISIT VIA VIDEO  I connected with [employer] on 01/12/19 at  1:00 PM EST by a video enabled telemedicine application and verified that I am speaking with the correct person using two identifiers. Location patient: Home Location provider: [facility], Office Persons participating in the virtual visit: Patient, Dr. Raoul Pitch and R.Baker, LPN  I discussed the limitations of evaluation and management by telemedicine and the availability of in person appointments. The patient expressed understanding and agreed to proceed.   Rebecca Long , 13-Oct-1950, 68 y.o., female MRN: [id number] Patient Care Team    Relationship Specialty Notifications Start End  Ma Hillock, DO PCP - General Family Medicine  10/06/14   Wallene Huh, [place] Consulting Physician Podiatry  10/18/15   Sydnee Levans, MD Consulting Physician Dermatology  10/18/15   Stefanie Libel, MD Consulting Physician Sports Medicine  11/01/15   Ladene Artist, MD Consulting Physician Gastroenterology  11/04/17   Rutherford Guys, MD Consulting Physician Ophthalmology  06/17/18   Princess Bruins, MD Consulting Physician Obstetrics and Gynecology  06/17/18   Eino Farber, PA-C Physician Assistant Psychiatry  06/17/18    Comment: [facility]    Chief Complaint  Patient presents with  . Hypertension    Pt is doing well with no complaints. Would like 90 day supply.   . Depression  . Anxiety     Subjective: Rebecca Long is a 68 y.o. female Pt presents for [facility] follow up.   Hypertension/hyperlipidemia/overweight:   Pt reports  compliance with Losartan 100 mg daily, Lipitor 10 mg daily and daily baby aspirin. Patient denies chest pain, shortness of breath, dizziness or lower extremity edema.  Pt endorses use of daily baby ASA. Pt is  prescribed statin. Labs up-to-date 05/2018 Exercise: exercise and pickle ball.  RF: HTN, HLD, Fhx Heart disease   Depression screen [facility] 2/9 06/17/2018 11/04/2017 05/15/2017  04/09/2017 11/12/2016  Decreased Interest 3 0 1 1 0  Down, Depressed, Hopeless 3 0 1 1 0  PHQ - 2 Score 6 0 2 2 0  Altered sleeping 1 - 1 1 0  Tired, decreased energy 3 - 2 3 3   Change in appetite 0 - 2 1 0  Feeling bad or failure about yourself  1 - 1 1 0  Trouble concentrating 1 - 1 1 2   Moving slowly or fidgety/restless 0 - 0 1 0  Suicidal thoughts 0 - 0 0 0  PHQ-9 Score 12 - 9 10 5   Difficult doing work/chores Somewhat difficult - Not difficult at all Not difficult at all -   GAD 7 : Generalized Anxiety Score 06/17/2018 05/15/2017  Nervous, Anxious, on Edge 3 1  Control/stop worrying 3 1  Worry too much - different things 3 1  Trouble relaxing 3 1  Restless 0 1  Easily annoyed or irritable 3 1  Afraid - awful might happen 3 1  Total GAD 7 Score 18 7  Anxiety Difficulty Somewhat difficult Not difficult at all    Allergies  Allergen Reactions  . Latex Itching and Rash  . Polysporin [Bacitracin-Polymyxin B] Rash   Social History   Tobacco Use  . Smoking status: Former Smoker    Years: 15.00    Types: Cigarettes    Quit date: 01/30/1991    Years since quitting: 27.9  . Smokeless tobacco: Never Used  Substance Use Topics  . Alcohol use: Not Currently   Past Medical History:  Diagnosis Date  .  Anxiety   . Cavus foot, acquired 09/09/2012    bilateral cavus feet the one on the left shows that she has some probable neurogenic weakness with abnormal curvature and in inversion contracture Podiatry 06/2015: H&P and x-ray reviewed with patient. Today I went ahead and I did a proximal nerve block I was able to aspirate the second MPJ and got out amount of clear fluid and injected with a quarter cc dexamethasone Kenalog and ap  . DDD (degenerative disc disease), lumbar   . Depression   . Endometrial polyp   . GERD (gastroesophageal reflux disease)   . Hiatal hernia   . Hip flexor tendinitis, right 07/02/2016  . History of adenomatous polyp of colon   . History of esophageal  stricture   . History of gastric polyp 11/2017  . History of primary hyperparathyroidism    s/p  left parathryoidectomy 09-17-2008-- resolved  . Hyperlipidemia   . Hypertension   . Insomnia   . Lumbar stenosis   . Osteopenia   . Schatzki's ring   . Scoliosis   . Tendinopathy of right biceps tendon 07/02/2016  . Thickened endometrium   . Uterine fibroid   . Wears glasses    Past Surgical History:  Procedure Laterality Date  . BREAST REDUCTION SURGERY Bilateral 2000  . COLONOSCOPY    . CYST EXCISION  1985   head and rt axilla  . DILATATION & CURETTAGE/HYSTEROSCOPY WITH MYOSURE N/A 08/13/2018   Procedure: DILATATION & CURETTAGE/HYSTEROSCOPY WITH MYOSURE;  Surgeon: Princess Bruins, MD;  Location: [place];  Service: Gynecology;  Laterality: N/A;  requests 10:15am in [facility] block request 30 minutes  . DILATION AND CURETTAGE OF UTERUS N/A 08/13/2018  . ECTOPIC PREGNANCY SURGERY  1981  . PARATHYROIDECTOMY Left 09/17/2008   dr Ronnald Collum  @[facility]    left superior  (adenoma)  . SHOULDER SURGERY Right 2007   "frozen"  . UPPER GASTROINTESTINAL ENDOSCOPY  last one 12-18-2017   dr Rush Landmark   Family History  Problem Relation Age of Onset  . Heart disease Father 63  . Breast cancer Mother   . Hypertension Brother   . Hypertension Sister   . Breast cancer Maternal Aunt   . Colon cancer Neg Hx   . Esophageal cancer Neg Hx   . Stomach cancer Neg Hx   . Rectal cancer Neg Hx   . Colon polyps Neg Hx    Allergies as of 01/12/2019      Reactions   Latex Itching, Rash   Polysporin [bacitracin-polymyxin B] Rash      Medication List       Accurate as of January 12, 2019  1:08 PM. If you have any questions, ask your nurse or doctor.        atorvastatin 10 MG tablet Commonly known as: LIPITOR Take 1 tablet (10 mg total) by mouth daily. Needs office visit prior to any additional refills. What changed: when to take this   buPROPion 150 MG 24 hr  tablet Commonly known as: WELLBUTRIN XL Take 150 mg by mouth daily.   FLUoxetine 40 MG capsule Commonly known as: PROZAC Take 40 mg by mouth daily. What changed: Another medication with the same name was removed. Continue taking this medication, and follow the directions you see here. Changed by: Howard Pouch, DO   losartan 100 MG tablet Commonly known as: COZAAR Take 1 tablet (100 mg total) by mouth daily. What changed: when to take this   omeprazole 40 MG capsule Commonly  known as: PRILOSEC Take 1 capsule (40 mg total) by mouth daily.   valACYclovir 500 MG tablet Commonly known as: VALTREX Take 500 mg by mouth as needed.   zolpidem 5 MG tablet Commonly known as: AMBIEN Take 2.5 mg by mouth at bedtime.       All past medical history, surgical history, allergies, family history, immunizations andmedications were updated in the EMR today and reviewed under the history and medication portions of their EMR.     ROS: Negative, with the exception of above mentioned in HPI   Objective:  BP 133/84   Ht 5\' 2"  (1.575 m)   BMI 29.63 kg/m  Body mass index is 29.63 kg/m. Gen: Afebrile. No acute distress.  HENT: AT. Bern[place].  Eyes:Pupils Equal Round Reactive to light, Extraocular movements intact,  Conjunctiva without redness, discharge or icterus. Chest: No cough or shortness of breath present. Neuro: PERLA. EOMi. Alert. Oriented x3  Psych: Normal affect, dress and demeanor. Normal speech. Normal thought content and judgment.   Assessment/Plan: Rebecca Long is a 68 y.o. female present for OV for  Essential hypertension, benign/Hyperlipidemia, unspecified hyperlipidemia type/overweight -  stable. Refills provided today on losartan  - Continue exercising at least 150 minutes a week. Low-sodium diet. Labs UTD due next appt.  - F/U 6 months as long as doing well.   Depression with anxiety Managed by J. Hughs.   Reviewed expectations re: course of current medical  issues.  Discussed self-management of symptoms.  Outlined signs and symptoms indicating need for more acute intervention.  Patient verbalized understanding and all questions were answered.  Patient received an After-Visit Summary.    No orders of the defined types were placed in this encounter.   > 15 minutes spent with patient, > 50% of that time face to face   Note is dictated utilizing voice recognition software. Although note has been proof read prior to signing, occasional typographical errors still can be missed. If any questions arise, please do not hesitate to call for verification.   electronically signed by:  Howard Pouch, DO  [place]

## 2019-02-19 ENCOUNTER — Ambulatory Visit: Payer: Medicare HMO | Attending: Internal Medicine

## 2019-02-19 DIAGNOSIS — Z23 Encounter for immunization: Secondary | ICD-10-CM | POA: Insufficient documentation

## 2019-02-19 DIAGNOSIS — R69 Illness, unspecified: Secondary | ICD-10-CM | POA: Diagnosis not present

## 2019-02-19 DIAGNOSIS — F3341 Major depressive disorder, recurrent, in partial remission: Secondary | ICD-10-CM | POA: Diagnosis not present

## 2019-02-19 NOTE — Progress Notes (Signed)
   Covid-19 Vaccination Clinic  Name:  Rebecca Long    MRN: [id number] DOB: 04/02/1950  02/19/2019  Rebecca Long was observed post Covid-19 immunization for 15 minutes without incidence. She was provided with Vaccine Information Sheet and instruction to access the V-Safe system.   Rebecca Long was instructed to call 911 with any severe reactions post vaccine: Marland Kitchen Difficulty breathing  . Swelling of your face and throat  . A fast heartbeat  . A bad rash all over your body  . Dizziness and weakness    Immunizations Administered    Name Date Dose VIS Date Route   Pfizer COVID-19 Vaccine 02/19/2019  3:36 PM 0.3 mL 01/09/2019 Intramuscular   Manufacturer: [place]   Lot: [id number]   [place]: [id number]

## 2019-02-20 DIAGNOSIS — R69 Illness, unspecified: Secondary | ICD-10-CM | POA: Diagnosis not present

## 2019-02-20 DIAGNOSIS — F419 Anxiety disorder, unspecified: Secondary | ICD-10-CM | POA: Diagnosis not present

## 2019-03-10 DIAGNOSIS — F419 Anxiety disorder, unspecified: Secondary | ICD-10-CM | POA: Diagnosis not present

## 2019-03-10 DIAGNOSIS — R69 Illness, unspecified: Secondary | ICD-10-CM | POA: Diagnosis not present

## 2019-03-12 ENCOUNTER — Ambulatory Visit: Payer: Medicare HMO | Attending: Internal Medicine

## 2019-03-12 DIAGNOSIS — Z23 Encounter for immunization: Secondary | ICD-10-CM | POA: Insufficient documentation

## 2019-03-12 NOTE — Progress Notes (Signed)
   Covid-19 Vaccination Clinic  Name:  Rebecca Long    MRN: [id number] DOB: 07-21-1950  03/12/2019  [name] was observed post Covid-19 immunization for 15 minutes without incidence. She was provided with Vaccine Information Sheet and instruction to access the V-Safe system.   [name] was instructed to call 911 with any severe reactions post vaccine: Marland Kitchen Difficulty breathing  . Swelling of your face and throat  . A fast heartbeat  . A bad rash all over your body  . Dizziness and weakness    Immunizations Administered    Name Date Dose VIS Date Route   Pfizer COVID-19 Vaccine 03/12/2019  4:36 PM 0.3 mL 01/09/2019 Intramuscular   Manufacturer: [place]   Lot: [id number]   [place]: [id number]

## 2019-03-16 DIAGNOSIS — R69 Illness, unspecified: Secondary | ICD-10-CM | POA: Diagnosis not present

## 2019-04-29 DIAGNOSIS — G4700 Insomnia, unspecified: Secondary | ICD-10-CM | POA: Diagnosis not present

## 2019-04-29 DIAGNOSIS — G8929 Other chronic pain: Secondary | ICD-10-CM | POA: Diagnosis not present

## 2019-04-29 DIAGNOSIS — R32 Unspecified urinary incontinence: Secondary | ICD-10-CM | POA: Diagnosis not present

## 2019-04-29 DIAGNOSIS — I10 Essential (primary) hypertension: Secondary | ICD-10-CM | POA: Diagnosis not present

## 2019-04-29 DIAGNOSIS — R69 Illness, unspecified: Secondary | ICD-10-CM | POA: Diagnosis not present

## 2019-04-29 DIAGNOSIS — E785 Hyperlipidemia, unspecified: Secondary | ICD-10-CM | POA: Diagnosis not present

## 2019-04-29 DIAGNOSIS — K219 Gastro-esophageal reflux disease without esophagitis: Secondary | ICD-10-CM | POA: Diagnosis not present

## 2019-04-29 DIAGNOSIS — M48061 Spinal stenosis, lumbar region without neurogenic claudication: Secondary | ICD-10-CM | POA: Diagnosis not present

## 2019-04-29 DIAGNOSIS — M1990 Unspecified osteoarthritis, unspecified site: Secondary | ICD-10-CM | POA: Diagnosis not present

## 2019-04-29 DIAGNOSIS — Z008 Encounter for other general examination: Secondary | ICD-10-CM | POA: Diagnosis not present

## 2019-04-29 DIAGNOSIS — F419 Anxiety disorder, unspecified: Secondary | ICD-10-CM | POA: Diagnosis not present

## 2019-05-21 DIAGNOSIS — F419 Anxiety disorder, unspecified: Secondary | ICD-10-CM | POA: Diagnosis not present

## 2019-05-21 DIAGNOSIS — R69 Illness, unspecified: Secondary | ICD-10-CM | POA: Diagnosis not present

## 2019-06-02 DIAGNOSIS — Z1231 Encounter for screening mammogram for malignant neoplasm of breast: Secondary | ICD-10-CM | POA: Diagnosis not present

## 2019-06-02 LAB — HM MAMMOGRAPHY

## 2019-06-12 ENCOUNTER — Encounter: Payer: Self-pay | Admitting: Family Medicine

## 2019-07-17 ENCOUNTER — Telehealth: Payer: Self-pay

## 2019-07-17 DIAGNOSIS — E785 Hyperlipidemia, unspecified: Secondary | ICD-10-CM

## 2019-07-17 DIAGNOSIS — I10 Essential (primary) hypertension: Secondary | ICD-10-CM

## 2019-07-17 MED ORDER — LOSARTAN POTASSIUM 100 MG PO TABS: 100.0000 mg | ORAL_TABLET | Freq: Every day | ORAL | 0 refills | Status: DC

## 2019-07-17 MED ORDER — ATORVASTATIN CALCIUM 10 MG PO TABS: 10.0000 mg | ORAL_TABLET | Freq: Every day | ORAL | 0 refills | Status: DC

## 2019-07-17 NOTE — Telephone Encounter (Signed)
Pt was called and short supply was sent to the pharmacy. Pt wanted refill on Ambien and was told that could not be refilled until appt. We moved appt up.

## 2019-07-17 NOTE — Telephone Encounter (Signed)
ERROR

## 2019-07-17 NOTE — Telephone Encounter (Signed)
Patient asking for a bridge of medication until 07/27/19 appt    losartan (COZAAR) 100 MG tablet   [id number] - [place], [place] - [address] AT [place] [address]

## 2019-07-22 ENCOUNTER — Other Ambulatory Visit: Payer: Self-pay

## 2019-07-22 ENCOUNTER — Encounter: Payer: Self-pay | Admitting: Family Medicine

## 2019-07-22 ENCOUNTER — Ambulatory Visit: Payer: Medicare HMO | Admitting: Family Medicine

## 2019-07-22 ENCOUNTER — Ambulatory Visit: Payer: Self-pay

## 2019-07-22 VITALS — BP 150/80 | HR 66 | Ht 62.0 in | Wt 166.0 lb

## 2019-07-22 DIAGNOSIS — M7502 Adhesive capsulitis of left shoulder: Secondary | ICD-10-CM | POA: Diagnosis not present

## 2019-07-22 DIAGNOSIS — M7501 Adhesive capsulitis of right shoulder: Secondary | ICD-10-CM | POA: Diagnosis not present

## 2019-07-22 DIAGNOSIS — M25512 Pain in left shoulder: Secondary | ICD-10-CM | POA: Diagnosis not present

## 2019-07-22 DIAGNOSIS — M25511 Pain in right shoulder: Secondary | ICD-10-CM

## 2019-07-22 DIAGNOSIS — G8929 Other chronic pain: Secondary | ICD-10-CM | POA: Diagnosis not present

## 2019-07-22 MED ORDER — VITAMIN D (ERGOCALCIFEROL) 1.25 MG (50000 UNIT) PO CAPS: 50000.0000 [IU] | ORAL_CAPSULE | ORAL | 0 refills | Status: DC

## 2019-07-22 NOTE — Assessment & Plan Note (Signed)
Patient given injection today, tolerated the procedure well.  None none concerned that this is an early frozen shoulder.  Referred to physical therapy that I think will be beneficial as well.  Discussed topical anti-inflammatories and once weekly vitamin D.  Patient will start to increase activity.  Rotator cuff on ultrasound appeared to be intact.  Follow-up again in 6 weeks

## 2019-07-22 NOTE — Patient Instructions (Signed)
Good to see you PT frozen shoulder [place] Once weekly vitamin d See me again in 6 weeks

## 2019-07-22 NOTE — Progress Notes (Signed)
Rebecca Long [address] [place] Phone: [phone] Subjective:   I Rebecca Long am serving as Long Education administrator for Rebecca Long.  This visit occurred during the SARS-CoV-2 public health emergency.  Safety protocols were in place, including screening questions prior to the visit, additional usage of staff PPE, and extensive cleaning of exam room while observing appropriate contact time as indicated for disinfecting solutions.   I'm seeing this patient by the request  of:  Kuneff, Renee A, DO  CC: Left greater than right shoulder pain  [id number]  Rebecca Long is Long 69 y.o. female coming in with complaint of bilateral shoulder pain. Left shoulder is worse. Patient is right hand dominant. Pain radiates to the elbow. History of fracture and frozen shoulder on the right.   Onset-  1 year  Location - Deltoid  Duration-  Character- Dull, sharp  Aggravating factors- ADLs, sleeping  Reliving factors-  Therapies tried- ice, heat, topical, oral meds  Severity-  7/10 at its worse      Past Medical History:  Diagnosis Date  . Anxiety   . Cavus foot, acquired 09/09/2012    bilateral cavus feet the one on the left shows that she has some probable neurogenic weakness with abnormal curvature and in inversion contracture Podiatry 06/2015: H&P and x-ray reviewed with patient. Today I went ahead and I did Long proximal nerve block I was able to aspirate the second MPJ and got out amount of clear fluid and injected with Long quarter cc dexamethasone Kenalog and ap  . DDD (degenerative disc disease), lumbar   . Depression   . Endometrial polyp   . GERD (gastroesophageal reflux disease)   . Hiatal hernia   . Hip flexor tendinitis, right 07/02/2016  . History of adenomatous polyp of colon   . History of esophageal stricture   . History of gastric polyp 11/2017  . History of primary hyperparathyroidism    s/p  left parathryoidectomy 09-17-2008-- resolved    . Hyperlipidemia   . Hypertension   . Insomnia   . Lumbar stenosis   . Osteopenia   . Schatzki's ring   . Scoliosis   . Tendinopathy of right biceps tendon 07/02/2016  . Thickened endometrium   . Uterine fibroid   . Wears glasses    Past Surgical History:  Procedure Laterality Date  . BREAST REDUCTION SURGERY Bilateral 2000  . COLONOSCOPY    . CYST EXCISION  1985   head and rt axilla  . DILATATION & CURETTAGE/HYSTEROSCOPY WITH MYOSURE N/Long 08/13/2018   Procedure: DILATATION & CURETTAGE/HYSTEROSCOPY WITH MYOSURE;  Surgeon: Rebecca Bruins, MD;  Location: [place];  Service: Gynecology;  Laterality: N/Long;  requests 10:15am in [place] [facility] block request 30 minutes  . DILATION AND CURETTAGE OF UTERUS N/Long 08/13/2018  . ECTOPIC PREGNANCY SURGERY  1981  . PARATHYROIDECTOMY Left 09/17/2008   dr Rebecca Long  @[facility]    left superior  (adenoma)  . SHOULDER SURGERY Right 2007   "frozen"  . UPPER GASTROINTESTINAL ENDOSCOPY  last one 12-18-2017   dr Rush Landmark   Social History   Socioeconomic History  . Marital status: Married    Spouse name: Not on file  . Number of children: 1  . Years of education: Not on file  . Highest education level: Not on file  Occupational History  . Not on file  Tobacco Use  . Smoking status: Former Smoker    Years: 15.00  Types: Cigarettes    Quit date: 01/30/1991    Years since quitting: 28.4  . Smokeless tobacco: Never Used  Vaping Use  . Vaping Use: Never used  Substance and Sexual Activity  . Alcohol use: Not Currently  . Drug use: Never  . Sexual activity: Not Currently    Birth control/protection: Post-menopausal    Comment: 1st intercourse- 78, partners- 67, married- 82 yrs   Other Topics Concern  . Not on file  Social History Narrative   Lives with husband. Feels safe at home. 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 [employer]. Some college. Former smoker. Wears seat belt.    Social Determinants of Health   Financial  Resource Strain:   . Difficulty of Paying Living Expenses:   Food Insecurity:   . Worried About Charity fundraiser in the Last Year:   . Arboriculturist in the Last Year:   Transportation Needs:   . Film/video editor (Medical):   Marland Kitchen Lack of Transportation (Non-Medical):   Physical Activity:   . Days of Exercise per Week:   . Minutes of Exercise per Session:   Stress:   . Feeling of Stress :   Social Connections:   . Frequency of Communication with Friends and Family:   . Frequency of Social Gatherings with Friends and Family:   . Attends Religious Services:   . Active Member of Clubs or Organizations:   . Attends Archivist Meetings:   Marland Kitchen Marital Status:    Allergies  Allergen Reactions  . Latex Itching and Rash  . Polysporin [Bacitracin-Polymyxin B] Rash   Family History  Problem Relation Age of Onset  . Heart disease Father 78  . Breast cancer Mother   . Hypertension Brother   . Hypertension Sister   . Breast cancer Maternal Aunt   . Colon cancer Neg Hx   . Esophageal cancer Neg Hx   . Stomach cancer Neg Hx   . Rectal cancer Neg Hx   . Colon polyps Neg Hx      Current Outpatient Medications (Cardiovascular):  .  atorvastatin (LIPITOR) 10 MG tablet, Take 1 tablet (10 mg total) by mouth daily. Needs office visit prior to any additional refills. Marland Kitchen  losartan (COZAAR) 100 MG tablet, Take 1 tablet (100 mg total) by mouth daily.     Current Outpatient Medications (Other):  Marland Kitchen  buPROPion (WELLBUTRIN XL) 150 MG 24 hr tablet, Take 150 mg by mouth daily.  Marland Kitchen  FLUoxetine (PROZAC) 40 MG capsule, Take 40 mg by mouth daily. Marland Kitchen  omeprazole (PRILOSEC) 40 MG capsule, Take 1 capsule (40 mg total) by mouth daily. .  valACYclovir (VALTREX) 500 MG tablet, Take 500 mg by mouth as needed.  .  zolpidem (AMBIEN) 5 MG tablet, Take 2.5 mg by mouth at bedtime.  .  Vitamin D, Ergocalciferol, (DRISDOL) 1.25 MG (50000 UNIT) CAPS capsule, Take 1 capsule (50,000 Units total) by mouth  every 7 (seven) days.   Reviewed prior external information including notes and imaging from  primary care provider As well as notes that were available from care everywhere and other healthcare systems.  Past medical history, social, surgical and family history all reviewed in electronic medical record.  No pertanent information unless stated regarding to the chief complaint.   Review of Systems:  No headache, visual changes, nausea, vomiting, diarrhea, constipation, dizziness, abdominal pain, skin rash, fevers, chills, night sweats, weight loss, swollen lymph nodes, body aches, joint swelling, chest pain, shortness of  breath, mood changes. POSITIVE muscle aches  Objective  Blood pressure (!) 150/80, pulse 66, height 5\' 2"  (1.575 m), weight 166 lb (75.3 kg), SpO2 97 %.   General: No apparent distress alert and oriented x3 mood and affect normal, dressed appropriately.  HEENT: Pupils equal, extraocular movements intact  Respiratory: Patient's speak in full sentences and does not appear short of breath  Cardiovascular: No lower extremity edema, non tender, no erythema  Neuro: Cranial nerves II through XII are intact, neurovascularly intact in all extremities with 2+ DTRs and 2+ pulses.  Gait normal with good balance and coordination.  MSK: Left shoulder exam shows the patient does have limited range of motion in internal and external range of motion and only forward flexion to 140 degrees. 5 out of 5 strength of the rotator cuff noted bilaterally.  Mild positive impingement on the right side Limited musculoskeletal ultrasound was performed and interpreted by Lyndal Pulley  Limited ultrasound of patient's left shoulder shows there is some increasing size and hypoechoic changes significant of the capsule noted anteriorly and superiorly.  Patient does have some mild degenerative changes of the rotator cuff but no acute tear appreciated.  Trace subacromial bursitis also noted. Impression:  Likely early frozen shoulder  Procedure: Real-time Ultrasound Guided Injection of left glenohumeral joint Device: GE Logiq E  Ultrasound guided injection is preferred based studies that show increased duration, increased effect, greater accuracy, decreased procedural pain, increased response rate with ultrasound guided versus blind injection.  Verbal informed consent obtained.  Time-out conducted.  Noted no overlying erythema, induration, or other signs of local infection.  Skin prepped in Long sterile fashion.  Local anesthesia: Topical Ethyl chloride.  With sterile technique and under real time ultrasound guidance:  Joint visualized.  21g 2 inch needle inserted posterior approach. Pictures taken for needle placement. Patient did have injection of 2 cc of 0.5% Marcaine, and 1cc of Kenalog 40 mg/dL. Completed without difficulty  Pain immediately resolved suggesting accurate placement of the medication.  Advised to call if fevers/chills, erythema, induration, drainage, or persistent bleeding.  Images permanently stored and available for review in the ultrasound unit.  Impression: Technically successful ultrasound guided injection.     Impression and Recommendations:     The above documentation has been reviewed and is accurate and complete Lyndal Pulley, DO       Note: This dictation was prepared with Dragon dictation along with smaller phrase technology. Any transcriptional errors that result from this process are unintentional.

## 2019-07-23 ENCOUNTER — Encounter: Payer: Self-pay | Admitting: Family Medicine

## 2019-07-23 ENCOUNTER — Ambulatory Visit (INDEPENDENT_AMBULATORY_CARE_PROVIDER_SITE_OTHER): Payer: Medicare HMO | Admitting: Family Medicine

## 2019-07-23 ENCOUNTER — Other Ambulatory Visit: Payer: Self-pay

## 2019-07-23 VITALS — BP 144/83 | HR 64 | Temp 98.0°F | Resp 18 | Ht 62.0 in | Wt 164.1 lb

## 2019-07-23 DIAGNOSIS — Z131 Encounter for screening for diabetes mellitus: Secondary | ICD-10-CM | POA: Diagnosis not present

## 2019-07-23 DIAGNOSIS — E785 Hyperlipidemia, unspecified: Secondary | ICD-10-CM

## 2019-07-23 DIAGNOSIS — J3489 Other specified disorders of nose and nasal sinuses: Secondary | ICD-10-CM | POA: Diagnosis not present

## 2019-07-23 DIAGNOSIS — I10 Essential (primary) hypertension: Secondary | ICD-10-CM

## 2019-07-23 DIAGNOSIS — F418 Other specified anxiety disorders: Secondary | ICD-10-CM | POA: Diagnosis not present

## 2019-07-23 DIAGNOSIS — R69 Illness, unspecified: Secondary | ICD-10-CM | POA: Diagnosis not present

## 2019-07-23 LAB — CBC WITH DIFFERENTIAL/PLATELET
Basophils Absolute: 0.1 10*3/uL (ref 0.0–0.1)
Basophils Relative: 0.9 % (ref 0.0–3.0)
Eosinophils Absolute: 0 10*3/uL (ref 0.0–0.7)
Eosinophils Relative: 0.3 % (ref 0.0–5.0)
HCT: 42.8 % (ref 36.0–46.0)
Hemoglobin: 14.1 g/dL (ref 12.0–15.0)
Lymphocytes Relative: 22.6 % (ref 12.0–46.0)
Lymphs Abs: 1.9 10*3/uL (ref 0.7–4.0)
MCHC: 32.9 g/dL (ref 30.0–36.0)
MCV: 87.6 fl (ref 78.0–100.0)
Monocytes Absolute: 0.5 10*3/uL (ref 0.1–1.0)
Monocytes Relative: 6.3 % (ref 3.0–12.0)
Neutro Abs: 5.9 10*3/uL (ref 1.4–7.7)
Neutrophils Relative %: 69.9 % (ref 43.0–77.0)
Platelets: 296 10*3/uL (ref 150.0–400.0)
RBC: 4.88 Mil/uL (ref 3.87–5.11)
RDW: 14.2 % (ref 11.5–15.5)
WBC: 8.4 10*3/uL (ref 4.0–10.5)

## 2019-07-23 LAB — LIPID PANEL
Cholesterol: 192 mg/dL (ref 0–200)
HDL: 68.2 mg/dL (ref >39.00)
LDL Cholesterol: 108 mg/dL — ABNORMAL HIGH (ref 0–99)
NonHDL: 124.09
Total CHOL/HDL Ratio: 3
Triglycerides: 79 mg/dL (ref 0.0–149.0)
VLDL: 15.8 mg/dL (ref 0.0–40.0)

## 2019-07-23 LAB — TSH: TSH: 1.31 u[IU]/mL (ref 0.35–4.50)

## 2019-07-23 LAB — HEMOGLOBIN A1C: Hgb A1c MFr Bld: 5.5 % (ref 4.6–6.5)

## 2019-07-23 MED ORDER — AMLODIPINE BESYLATE 2.5 MG PO TABS: 2.5000 mg | ORAL_TABLET | Freq: Every day | ORAL | 1 refills | Status: DC

## 2019-07-23 MED ORDER — LOSARTAN POTASSIUM 100 MG PO TABS: 100.0000 mg | ORAL_TABLET | Freq: Every day | ORAL | 1 refills | Status: DC

## 2019-07-23 MED ORDER — ZOLPIDEM TARTRATE 5 MG PO TABS: 2.5000 mg | ORAL_TABLET | Freq: Every day | ORAL | 1 refills | Status: DC

## 2019-07-23 MED ORDER — MUPIROCIN 2 % EX OINT: 1.0000 "application " | TOPICAL_OINTMENT | Freq: Two times a day (BID) | CUTANEOUS | 0 refills | Status: DC

## 2019-07-23 NOTE — Progress Notes (Signed)
Rebecca Long , 10-17-1950, 69 y.o., female MRN: [id number] Patient Care Team    Relationship Specialty Notifications Start End  Ma Hillock, DO PCP - General Family Medicine  10/06/14   Wallene Huh, [place] Consulting Physician Podiatry  10/18/15   Sydnee Levans, MD Consulting Physician Dermatology  10/18/15   Stefanie Libel, MD Consulting Physician Sports Medicine  11/01/15   Ladene Artist, MD Consulting Physician Gastroenterology  11/04/17   Rutherford Guys, MD Consulting Physician Ophthalmology  06/17/18   Princess Bruins, MD Consulting Physician Obstetrics and Gynecology  06/17/18   Eino Farber, PA-C Physician Assistant Psychiatry  06/17/18    Comment: [facility]    Chief Complaint  Patient presents with  . Hypertension    Needs refills      Subjective: Rebecca Long is a 69 y.o. female Pt presents for [facility] follow up.  Hypertension/hyperlipidemia/overweight:   Pt reports  compliance with Losartan 100 mg daily, Lipitor 10 mg daily and daily baby aspirin. Patient denies chest pain, shortness of breath, dizziness or lower extremity edema.  Pt endorses use of daily baby ASA. Pt is  prescribed statin. Labs up-to-date 05/2018 Exercise: exercise and pickle ball.  RF: HTN, HLD, Fhx Heart disease  Insomnia: Patient reports she has been prescribed Ambien for many many years.  She would like to have this prescription taken over by this provider.  She reports she takes Ambien 2.5 mg nightly routinely and occasionally if still unable to fall asleep will take a full Ambien 5 mg.  Right nasal lesion: She reports she has had a right nasal lesion off and on for the last few years.  She feels like she can "smell infection.  "   Depression screen [facility] 2/9 07/23/2019 06/17/2018 11/04/2017 05/15/2017 04/09/2017  Decreased Interest 0 3 0 1 1  Down, Depressed, Hopeless 0 3 0 1 1  PHQ - 2 Score 0 6 0 2 2  Altered sleeping 0 1 - 1 1  Tired, decreased energy 3 3 - 2 3  Change in  appetite 0 0 - 2 1  Feeling bad or failure about yourself  1 1 - 1 1  Trouble concentrating 3 1 - 1 1  Moving slowly or fidgety/restless 0 0 - 0 1  Suicidal thoughts 0 0 - 0 0  PHQ-9 Score 7 12 - 9 10  Difficult doing work/chores Not difficult at all Somewhat difficult - Not difficult at all Not difficult at all   GAD 7 : Generalized Anxiety Score 07/23/2019 06/17/2018 05/15/2017  Nervous, Anxious, on Edge '1 3 1  ' Control/stop worrying 0 3 1  Worry too much - different things 0 3 1  Trouble relaxing 0 3 1  Restless 0 0 1  Easily annoyed or irritable 0 3 1  Afraid - awful might happen 0 3 1  Total GAD 7 Score '1 18 7  ' Anxiety Difficulty Not difficult at all Somewhat difficult Not difficult at all    Allergies  Allergen Reactions  . Latex Itching and Rash  . Polysporin [Bacitracin-Polymyxin B] Rash   Social History   Tobacco Use  . Smoking status: Former Smoker    Years: 15.00    Types: Cigarettes    Quit date: 01/30/1991    Years since quitting: 28.4  . Smokeless tobacco: Never Used  Substance Use Topics  . Alcohol use: Not Currently   Past Medical History:  Diagnosis Date  . Anxiety   . Cavus foot,  acquired 09/09/2012    bilateral cavus feet the one on the left shows that she has some probable neurogenic weakness with abnormal curvature and in inversion contracture Podiatry 06/2015: H&P and x-ray reviewed with patient. Today I went ahead and I did a proximal nerve block I was able to aspirate the second MPJ and got out amount of clear fluid and injected with a quarter cc dexamethasone Kenalog and ap  . DDD (degenerative disc disease), lumbar   . Depression   . Endometrial polyp   . GERD (gastroesophageal reflux disease)   . Hiatal hernia   . Hip flexor tendinitis, right 07/02/2016  . History of adenomatous polyp of colon   . History of esophageal stricture   . History of gastric polyp 11/2017  . History of primary hyperparathyroidism    s/p  left parathryoidectomy  09-17-2008-- resolved  . Hyperlipidemia   . Hypertension   . Insomnia   . Lumbar stenosis   . Osteopenia   . Schatzki's ring   . Scoliosis   . Tendinopathy of right biceps tendon 07/02/2016  . Thickened endometrium   . Uterine fibroid   . Wears glasses    Past Surgical History:  Procedure Laterality Date  . BREAST REDUCTION SURGERY Bilateral 2000  . COLONOSCOPY    . CYST EXCISION  1985   head and rt axilla  . DILATATION & CURETTAGE/HYSTEROSCOPY WITH MYOSURE N/A 08/13/2018   Procedure: DILATATION & CURETTAGE/HYSTEROSCOPY WITH MYOSURE;  Surgeon: Princess Bruins, MD;  Location: [facility];  Service: Gynecology;  Laterality: N/A;  requests 10:15am in [place] Gyn block request 30 minutes  . DILATION AND CURETTAGE OF UTERUS N/A 08/13/2018  . ECTOPIC PREGNANCY SURGERY  1981  . PARATHYROIDECTOMY Left 09/17/2008   dr Ronnald Collum  '@[facility]'    left superior  (adenoma)  . SHOULDER SURGERY Right 2007   "frozen"  . UPPER GASTROINTESTINAL ENDOSCOPY  last one 12-18-2017   dr Rush Landmark   Family History  Problem Relation Age of Onset  . Heart disease Father 58  . Breast cancer Mother   . Hypertension Brother   . Hypertension Sister   . Breast cancer Maternal Aunt   . Colon cancer Neg Hx   . Esophageal cancer Neg Hx   . Stomach cancer Neg Hx   . Rectal cancer Neg Hx   . Colon polyps Neg Hx    Allergies as of 07/23/2019      Reactions   Latex Itching, Rash   Polysporin [bacitracin-polymyxin B] Rash      Medication List       Accurate as of July 23, 2019  3:04 PM. If you have any questions, ask your nurse or doctor.        STOP taking these medications   buPROPion 150 MG 24 hr tablet Commonly known as: WELLBUTRIN XL Stopped by: Howard Pouch, DO     TAKE these medications   ALPRAZolam 0.25 MG tablet Commonly known as: XANAX Take 0.25-0.5 mg by mouth daily.   amLODipine 2.5 MG tablet Commonly known as: NORVASC Take 1 tablet (2.5 mg total) by mouth  daily. Started by: Howard Pouch, DO   atorvastatin 10 MG tablet Commonly known as: LIPITOR Take 1 tablet (10 mg total) by mouth daily. Needs office visit prior to any additional refills.   FLUoxetine 40 MG capsule Commonly known as: PROZAC Take 40 mg by mouth daily.   losartan 100 MG tablet Commonly known as: COZAAR Take 1 tablet (100 mg total) by mouth  daily.   mupirocin ointment 2 % Commonly known as: Bactroban Place 1 application into the nose 2 (two) times daily. Started by: Howard Pouch, DO   omeprazole 40 MG capsule Commonly known as: PRILOSEC Take 1 capsule (40 mg total) by mouth daily.   valACYclovir 500 MG tablet Commonly known as: VALTREX Take 500 mg by mouth as needed.   Vitamin D (Ergocalciferol) 1.25 MG (50000 UNIT) Caps capsule Commonly known as: DRISDOL Take 1 capsule (50,000 Units total) by mouth every 7 (seven) days.   zolpidem 5 MG tablet Commonly known as: AMBIEN Take 0.5-1 tablets (2.5-5 mg total) by mouth at bedtime. What changed: how much to take Changed by: Howard Pouch, DO       All past medical history, surgical history, allergies, family history, immunizations andmedications were updated in the EMR today and reviewed under the history and medication portions of their EMR.     ROS: Negative, with the exception of above mentioned in HPI   Objective:  BP (!) 144/83 (BP Location: Right Arm, Patient Position: Sitting, Cuff Size: Normal)   Pulse 64   Temp 98 F (36.7 C) (Temporal)   Resp 18   Ht '5\' 2"'  (1.575 m)   Wt 164 lb 2 oz (74.4 kg)   SpO2 97%   BMI 30.02 kg/m  Body mass index is 30.02 kg/m. Gen: Afebrile. No acute distress.  HENT: AT. [place].  Left nare with mild erythema and location of her discomfort.  No drainage appreciated.  No tenderness.  Throat without erythema or exudates.  Eyes:Pupils Equal Round Reactive to light, Extraocular movements intact,  Conjunctiva without redness, discharge or icterus. Neck/lymp/endocrine: Supple,  no lymphadenopathy, no thyromegaly CV: RRR no murmur, no edema, +2/4 P posterior tibialis pulses Chest: CTAB, no wheeze or crackles Neuro:  Normal gait. PERLA. EOMi. Alert. Oriented x3  Psych: Normal affect, dress and demeanor. Normal speech. Normal thought content and judgment.  Assessment/Plan: Rebecca Long is a 69 y.o. female present for OV for  Essential hypertension, benign/Hyperlipidemia, unspecified hyperlipidemia type/overweight -  Above goal of<130/80 -Continue losartan 100 mg daily -Start amlodipine 2.5 mg daily -CBC, CMP, TSH, lipid panel collected today-patient's preference and she is fasting - Continue exercising at least 150 minutes a week. Low-sodium diet. Labs UTD due next appt.  - F/U 5.5 months as long as doing well.  Diabetes screening: A1c collected today-patient preference  Insomnia:  Agreed to take over patient's Ambien prescription.  Discussed safety profile with her and the likelihood of possibly needing to try another medication to help her sleep in the future. For now Ambien 2.5-5 seems to be working well with her without negative side effects. Continue Ambien 2.5-5 mg nightly.  [place] controlled substance database reviewed today. Follow-up face-to-face every 6 months needed for refills.  Depression with anxiety Managed by J. Hughs.  nasal lesion:  Small quantity of Bactroban ointment application twice daily to area of concern If area is not healed within 2 weeks, or if worsening-follow-up.   Reviewed expectations re: course of current medical issues.  Discussed self-management of symptoms.  Outlined signs and symptoms indicating need for more acute intervention.  Patient verbalized understanding and all questions were answered.  Patient received an After-Visit Summary.    Orders Placed This Encounter  Procedures  . Lipid panel  . TSH  . CBC w/Diff  . Comp Met (CMET)  . Hemoglobin A1c   Meds ordered this encounter  Medications   . losartan (COZAAR) 100 MG  tablet    Sig: Take 1 tablet (100 mg total) by mouth daily.    Dispense:  90 tablet    Refill:  1  . amLODipine (NORVASC) 2.5 MG tablet    Sig: Take 1 tablet (2.5 mg total) by mouth daily.    Dispense:  90 tablet    Refill:  1  . mupirocin ointment (BACTROBAN) 2 %    Sig: Place 1 application into the nose 2 (two) times daily.    Dispense:  22 g    Refill:  0  . zolpidem (AMBIEN) 5 MG tablet    Sig: Take 0.5-1 tablets (2.5-5 mg total) by mouth at bedtime.    Dispense:  90 tablet    Refill:  1   Referral Orders  No referral(s) requested today       Note is dictated utilizing voice recognition software. Although note has been proof read prior to signing, occasional typographical errors still can be missed. If any questions arise, please do not hesitate to call for verification.   electronically signed by:  Howard Pouch, DO  [place]

## 2019-07-23 NOTE — Patient Instructions (Addendum)
Physical in beginning of December.  We will call you with lab results.  Continue losartan and added amlodipine for your BP.  Prescribed your Lorrin Mais for you.

## 2019-07-24 ENCOUNTER — Other Ambulatory Visit: Payer: Self-pay | Admitting: Family Medicine

## 2019-07-24 DIAGNOSIS — I10 Essential (primary) hypertension: Secondary | ICD-10-CM

## 2019-07-24 DIAGNOSIS — E785 Hyperlipidemia, unspecified: Secondary | ICD-10-CM

## 2019-07-24 LAB — COMPREHENSIVE METABOLIC PANEL
ALT: 19 U/L (ref 0–35)
AST: 17 U/L (ref 0–37)
Albumin: 4.6 g/dL (ref 3.5–5.2)
Alkaline Phosphatase: 58 U/L (ref 39–117)
BUN: 19 mg/dL (ref 6–23)
CO2: 27 mEq/L (ref 19–32)
Calcium: 10 mg/dL (ref 8.4–10.5)
Chloride: 108 mEq/L (ref 96–112)
Creatinine, Ser: 0.71 mg/dL (ref 0.40–1.20)
GFR: 81.43 mL/min (ref >60.00)
Glucose, Bld: 87 mg/dL (ref 70–99)
Potassium: 4.2 mEq/L (ref 3.5–5.1)
Sodium: 139 mEq/L (ref 135–145)
Total Bilirubin: 0.9 mg/dL (ref 0.2–1.2)
Total Protein: 7 g/dL (ref 6.0–8.3)

## 2019-07-24 MED ORDER — ATORVASTATIN CALCIUM 20 MG PO TABS: 20.0000 mg | ORAL_TABLET | Freq: Every day | ORAL | 3 refills | Status: DC

## 2019-07-27 ENCOUNTER — Ambulatory Visit: Payer: Medicare HMO | Admitting: Family Medicine

## 2019-07-29 ENCOUNTER — Telehealth: Payer: Self-pay

## 2019-07-29 NOTE — Telephone Encounter (Signed)
Pt was called again and message was left to return call

## 2019-07-29 NOTE — Telephone Encounter (Signed)
Pt was called to get more information, no answer, VM was left to return call or I would try to call back again at a later time

## 2019-07-29 NOTE — Telephone Encounter (Signed)
Patient called in stating that after her last OV and her medication changes. She has been very dizzy. That's seems to be the only symptom. Feeling very weird especially after physical therapy also     Please call and advise

## 2019-07-30 NOTE — Telephone Encounter (Signed)
Pt was called and asked if she has been checking BP  Pt has checked BP a couple of times but never during being dizzy at times. Lowest number was 120/60 with wrist cuff and she was not having symptoms when she checked. She said the vertigo happened right after a PT session and when she got up in the middle of the night because her dog got sick. She did jump out of bed. Pt states she is feeling better today. Instructed pt on how to use a wrist cuff properly, ambulating slowly and allowing herself a minute or two before walking off. Pt encouraged to increase water intake. She was advised to write down readings when feeling dizzy and two hours after taking meds. If she started feeling dizzy again to call and she would need appt (bring BP log) to see if adjustments needed to be made. Pt verbalized understanding

## 2019-08-31 ENCOUNTER — Other Ambulatory Visit: Payer: Self-pay | Admitting: Gastroenterology

## 2019-09-02 ENCOUNTER — Other Ambulatory Visit: Payer: Self-pay

## 2019-09-02 ENCOUNTER — Ambulatory Visit: Payer: Medicare HMO | Admitting: Family Medicine

## 2019-09-02 ENCOUNTER — Encounter: Payer: Self-pay | Admitting: Family Medicine

## 2019-09-02 DIAGNOSIS — M7502 Adhesive capsulitis of left shoulder: Secondary | ICD-10-CM

## 2019-09-02 NOTE — Progress Notes (Signed)
[place] Phone: [phone] Subjective:   Rebecca Long, am serving as a scribe for Dr. Hulan Saas.   I'm seeing this patient by the request  of:  Kuneff, Renee A, DO  CC: Left shoulder pain follow-up  QAS:[id number]   07/22/2019 Patient given injection today, tolerated the procedure well.  None none concerned that this is an early frozen shoulder.  Referred to physical therapy that I think will be beneficial as well.  Discussed topical anti-inflammatories and once weekly vitamin D.  Patient will start to increase activity.  Rotator cuff on ultrasound appeared to be intact.  Follow-up again in 6 weeks  Update 09/02/2019 Rebecca Long is a 69 y.o. female coming in with complaint of left shoulder pain. Patient states that injection did help decrease her pain.       Past Medical History:  Diagnosis Date  . Anxiety   . Cavus foot, acquired 09/09/2012    bilateral cavus feet the one on the left shows that she has some probable neurogenic weakness with abnormal curvature and in inversion contracture Podiatry 06/2015: H&P and x-ray reviewed with patient. Today I went ahead and I did a proximal nerve block I was able to aspirate the second MPJ and got out amount of clear fluid and injected with a quarter cc dexamethasone Kenalog and ap  . DDD (degenerative disc disease), lumbar   . Depression   . Endometrial polyp   . GERD (gastroesophageal reflux disease)   . Hiatal hernia   . Hip flexor tendinitis, right 07/02/2016  . History of adenomatous polyp of colon   . History of esophageal stricture   . History of gastric polyp 11/2017  . History of primary hyperparathyroidism    s/p  left parathryoidectomy 09-17-2008-- resolved  . Hyperlipidemia   . Hypertension   . Insomnia   . Lumbar stenosis   . Osteopenia   . Schatzki's ring   . Scoliosis   . Tendinopathy of right biceps tendon 07/02/2016  . Thickened  endometrium   . Uterine fibroid   . Wears glasses    Past Surgical History:  Procedure Laterality Date  . BREAST REDUCTION SURGERY Bilateral 2000  . COLONOSCOPY    . CYST EXCISION  1985   head and rt axilla  . DILATATION & CURETTAGE/HYSTEROSCOPY WITH MYOSURE N/A 08/13/2018   Procedure: DILATATION & CURETTAGE/HYSTEROSCOPY WITH MYOSURE;  Surgeon: Princess Bruins, MD;  Location: [facility];  Service: Gynecology;  Laterality: N/A;  requests 10:15am in [place] Gyn block request 30 minutes  . DILATION AND CURETTAGE OF UTERUS N/A 08/13/2018  . ECTOPIC PREGNANCY SURGERY  1981  . PARATHYROIDECTOMY Left 09/17/2008   dr Ronnald Collum  @[facility]    left superior  (adenoma)  . SHOULDER SURGERY Right 2007   "frozen"  . UPPER GASTROINTESTINAL ENDOSCOPY  last one 12-18-2017   dr Rush Landmark   Social History   Socioeconomic History  . Marital status: Married    Spouse name: Not on file  . Number of children: 1  . Years of education: Not on file  . Highest education level: Not on file  Occupational History  . Not on file  Tobacco Use  . Smoking status: Former Smoker    Years: 15.00    Types: Cigarettes    Quit date: 01/30/1991    Years since quitting: 28.6  . Smokeless tobacco: Never Used  Vaping Use  . Vaping Use: Never used  Substance and Sexual Activity  . Alcohol use: Not Currently  . Drug use: Never  . Sexual activity: Not Currently    Birth control/protection: Post-menopausal    Comment: 1st intercourse- 43, partners- 73, married- 60 yrs   Other Topics Concern  . Not on file  Social History Narrative   Lives with husband. Feels safe at home. 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 [employer]. Some college. Former smoker. Wears seat belt.    Social Determinants of Health   Financial Resource Strain:   . Difficulty of Paying Living Expenses:   Food Insecurity:   . Worried About Charity fundraiser in the Last Year:   . Arboriculturist in the Last Year:     Transportation Needs:   . Film/video editor (Medical):   Marland Kitchen Lack of Transportation (Non-Medical):   Physical Activity:   . Days of Exercise per Week:   . Minutes of Exercise per Session:   Stress:   . Feeling of Stress :   Social Connections:   . Frequency of Communication with Friends and Family:   . Frequency of Social Gatherings with Friends and Family:   . Attends Religious Services:   . Active Member of Clubs or Organizations:   . Attends Archivist Meetings:   Marland Kitchen Marital Status:    Allergies  Allergen Reactions  . Latex Itching and Rash  . Polysporin [Bacitracin-Polymyxin B] Rash   Family History  Problem Relation Age of Onset  . Heart disease Father 25  . Breast cancer Mother   . Hypertension Brother   . Hypertension Sister   . Breast cancer Maternal Aunt   . Colon cancer Neg Hx   . Esophageal cancer Neg Hx   . Stomach cancer Neg Hx   . Rectal cancer Neg Hx   . Colon polyps Neg Hx      Current Outpatient Medications (Cardiovascular):  .  amLODipine (NORVASC) 2.5 MG tablet, Take 1 tablet (2.5 mg total) by mouth daily. Marland Kitchen  atorvastatin (LIPITOR) 20 MG tablet, Take 1 tablet (20 mg total) by mouth daily. Marland Kitchen  losartan (COZAAR) 100 MG tablet, Take 1 tablet (100 mg total) by mouth daily.     Current Outpatient Medications (Other):  Marland Kitchen  ALPRAZolam (XANAX) 0.25 MG tablet, Take 0.25-0.5 mg by mouth daily. Marland Kitchen  FLUoxetine (PROZAC) 40 MG capsule, Take 40 mg by mouth daily. .  mupirocin ointment (BACTROBAN) 2 %, Place 1 application into the nose 2 (two) times daily. Marland Kitchen  omeprazole (PRILOSEC) 40 MG capsule, Take 1 capsule (40 mg total) by mouth daily. PLEASE SCHEDULE FOLLOW UP APPOINTMENT .  valACYclovir (VALTREX) 500 MG tablet, Take 500 mg by mouth as needed.  .  Vitamin D, Ergocalciferol, (DRISDOL) 1.25 MG (50000 UNIT) CAPS capsule, Take 1 capsule (50,000 Units total) by mouth every 7 (seven) days. Marland Kitchen  zolpidem (AMBIEN) 5 MG tablet, Take 0.5-1 tablets (2.5-5 mg  total) by mouth at bedtime.   Reviewed prior external information including notes and imaging from  primary care provider As well as notes that were available from care everywhere and other healthcare systems.  Past medical history, social, surgical and family history all reviewed in electronic medical record.  Long pertanent information unless stated regarding to the chief complaint.   Review of Systems:  Long headache, visual changes, nausea, vomiting, diarrhea, constipation, dizziness, abdominal pain, skin rash, fevers, chills, night sweats, weight loss, swollen lymph nodes, body aches, joint swelling, chest pain, shortness of breath, mood  changes. POSITIVE muscle aches  Objective  Blood pressure 112/80, pulse 64, height 5\' 2"  (1.575 m), weight 163 lb (73.9 kg), SpO2 96 %.   General: Long apparent distress alert and oriented x3 mood and affect normal, dressed appropriately.  HEENT: Pupils equal, extraocular movements intact  Respiratory: Patient's speak in full sentences and does not appear short of breath  Cardiovascular: Long lower extremity edema, non tender, Long erythema  Neuro: Cranial nerves II through XII are intact, neurovascularly intact in all extremities with 2+ DTRs and 2+ pulses.  Gait normal with good balance and coordination.  MSK: Mild arthritic changes of multiple joints.  Patient does have the left shoulder on the left side still has some limited internal and external range of motion but improvement in forward flexion.  Rotator cuff strength 4+ out of 5.  Negative crossover test    Impression and Recommendations:     The above documentation has been reviewed and is accurate and complete Lyndal Pulley, DO       Note: This dictation was prepared with Dragon dictation along with smaller phrase technology. Any transcriptional errors that result from this process are unintentional.

## 2019-09-02 NOTE — Patient Instructions (Signed)
Keep doing everything  See me in 6-8 weeks

## 2019-09-02 NOTE — Assessment & Plan Note (Signed)
Patient has made significant improvement in the range of motion at this time.  Has been working with a Physiological scientist.  We discussed with patient that we could consider the possibility of another injection but I think she is doing better.  Continue the home exercises.  Did order the x-rays but patient would like to hold on it at this time.  Worsening symptoms consider x-ray and MRI follow-up again in 6 to 8 weeks

## 2019-10-08 DIAGNOSIS — R69 Illness, unspecified: Secondary | ICD-10-CM | POA: Diagnosis not present

## 2019-10-09 ENCOUNTER — Other Ambulatory Visit: Payer: Self-pay | Admitting: Family Medicine

## 2019-10-22 ENCOUNTER — Ambulatory Visit: Payer: Medicare HMO | Admitting: Family Medicine

## 2019-10-22 ENCOUNTER — Encounter: Payer: Self-pay | Admitting: Family Medicine

## 2019-10-22 ENCOUNTER — Other Ambulatory Visit: Payer: Self-pay

## 2019-10-22 ENCOUNTER — Ambulatory Visit (INDEPENDENT_AMBULATORY_CARE_PROVIDER_SITE_OTHER): Payer: Medicare HMO

## 2019-10-22 VITALS — BP 124/80 | HR 64 | Ht 62.0 in | Wt 166.0 lb

## 2019-10-22 DIAGNOSIS — M19012 Primary osteoarthritis, left shoulder: Secondary | ICD-10-CM | POA: Diagnosis not present

## 2019-10-22 DIAGNOSIS — G8929 Other chronic pain: Secondary | ICD-10-CM

## 2019-10-22 DIAGNOSIS — M19019 Primary osteoarthritis, unspecified shoulder: Secondary | ICD-10-CM | POA: Insufficient documentation

## 2019-10-22 DIAGNOSIS — M7502 Adhesive capsulitis of left shoulder: Secondary | ICD-10-CM

## 2019-10-22 DIAGNOSIS — M25512 Pain in left shoulder: Secondary | ICD-10-CM

## 2019-10-22 NOTE — Patient Instructions (Addendum)
Good to see you Xray today  AC joint injection today Continue therapy Good luck in [place] See me again in 4-6 weeks

## 2019-10-22 NOTE — Assessment & Plan Note (Signed)
Patient given injection today.  We will see how much this responds.  X-rays are pending.  I am hoping that this was the rest of patient's pain and will continue to improve.  Follow-up with me again in 6 to 8 weeks

## 2019-10-22 NOTE — Assessment & Plan Note (Signed)
I believe the patient has made some improvement.  Patient does have some arthritic changes of the acromioclavicular joint noted and I did get an injection today.  X-rays are pending.  Worsening symptoms will need advanced imaging follow-up again in 6 to 8 weeks

## 2019-10-22 NOTE — Progress Notes (Signed)
[facility] [address] [place] Phone: [phone] Subjective:   I [name] am serving as a Education administrator for Dr. Hulan Saas.  This visit occurred during the SARS-CoV-2 public health emergency.  Safety protocols were in place, including screening questions prior to the visit, additional usage of staff PPE, and extensive cleaning of exam room while observing appropriate contact time as indicated for disinfecting solutions.   I'm seeing this patient by the request  of:  Kuneff, Renee A, DO  CC: left shoulder pain follow up   SWN:[id number]   09/02/2019 Patient has made significant improvement in the range of motion at this time.  Has been working with a Physiological scientist.  We discussed with patient that we could consider the possibility of another injection but I think she is doing better.  Continue the home exercises.  Did order the x-rays but patient would like to hold on it at this time.  Worsening symptoms consider x-ray and MRI follow-up again in 6 to 8 weeks  Update 10/22/2019 ROSELANI GRAJEDA is a 69 y.o. female coming in with complaint of left shoulder bursitis. Patient states she is in pain. Last injection helped some.  Patient states that most of the daily activities has improved but still having difficulty only at night.  Certain movements with raising does cause some difficulty.  Has had some improvement in range of motion.      Past Medical History:  Diagnosis Date  . Anxiety   . Cavus foot, acquired 09/09/2012    bilateral cavus feet the one on the left shows that she has some probable neurogenic weakness with abnormal curvature and in inversion contracture Podiatry 06/2015: H&P and x-ray reviewed with patient. Today I went ahead and I did a proximal nerve block I was able to aspirate the second MPJ and got out amount of clear fluid and injected with a quarter cc dexamethasone Kenalog and ap  . DDD (degenerative disc disease), lumbar    . Depression   . Endometrial polyp   . GERD (gastroesophageal reflux disease)   . Hiatal hernia   . Hip flexor tendinitis, right 07/02/2016  . History of adenomatous polyp of colon   . History of esophageal stricture   . History of gastric polyp 11/2017  . History of primary hyperparathyroidism    s/p  left parathryoidectomy 09-17-2008-- resolved  . Hyperlipidemia   . Hypertension   . Insomnia   . Lumbar stenosis   . Osteopenia   . Schatzki's ring   . Scoliosis   . Tendinopathy of right biceps tendon 07/02/2016  . Thickened endometrium   . Uterine fibroid   . Wears glasses    Past Surgical History:  Procedure Laterality Date  . BREAST REDUCTION SURGERY Bilateral 2000  . COLONOSCOPY    . CYST EXCISION  1985   head and rt axilla  . DILATATION & CURETTAGE/HYSTEROSCOPY WITH MYOSURE N/A 08/13/2018   Procedure: DILATATION & CURETTAGE/HYSTEROSCOPY WITH MYOSURE;  Surgeon: Princess Bruins, MD;  Location: [facility];  Service: Gynecology;  Laterality: N/A;  requests 10:15am in [place] [facility] block request 30 minutes  . DILATION AND CURETTAGE OF UTERUS N/A 08/13/2018  . ECTOPIC PREGNANCY SURGERY  1981  . PARATHYROIDECTOMY Left 09/17/2008   dr Ronnald Collum  @[facility]    left superior  (adenoma)  . SHOULDER SURGERY Right 2007   "frozen"  . UPPER GASTROINTESTINAL ENDOSCOPY  last one 12-18-2017   dr Rush Landmark   Social  History   Socioeconomic History  . Marital status: Married    Spouse name: Not on file  . Number of children: 1  . Years of education: Not on file  . Highest education level: Not on file  Occupational History  . Not on file  Tobacco Use  . Smoking status: Former Smoker    Years: 15.00    Types: Cigarettes    Quit date: 01/30/1991    Years since quitting: 28.7  . Smokeless tobacco: Never Used  Vaping Use  . Vaping Use: Never used  Substance and Sexual Activity  . Alcohol use: Not Currently  . Drug use: Never  . Sexual activity: Not Currently     Birth control/protection: Post-menopausal    Comment: 1st intercourse- 53, partners- 28, married- 36 yrs   Other Topics Concern  . Not on file  Social History Narrative   Lives with husband. Feels safe at home. 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 [employer]. Some college. Former smoker. Wears seat belt.    Social Determinants of Health   Financial Resource Strain:   . Difficulty of Paying Living Expenses: Not on file  Food Insecurity:   . Worried About Charity fundraiser in the Last Year: Not on file  . Ran Out of Food in the Last Year: Not on file  Transportation Needs:   . Lack of Transportation (Medical): Not on file  . Lack of Transportation (Non-Medical): Not on file  Physical Activity:   . Days of Exercise per Week: Not on file  . Minutes of Exercise per Session: Not on file  Stress:   . Feeling of Stress : Not on file  Social Connections:   . Frequency of Communication with Friends and Family: Not on file  . Frequency of Social Gatherings with Friends and Family: Not on file  . Attends Religious Services: Not on file  . Active Member of Clubs or Organizations: Not on file  . Attends Archivist Meetings: Not on file  . Marital Status: Not on file   Allergies  Allergen Reactions  . Latex Itching and Rash  . Polysporin [Bacitracin-Polymyxin B] Rash   Family History  Problem Relation Age of Onset  . Heart disease Father 57  . Breast cancer Mother   . Hypertension Brother   . Hypertension Sister   . Breast cancer Maternal Aunt   . Colon cancer Neg Hx   . Esophageal cancer Neg Hx   . Stomach cancer Neg Hx   . Rectal cancer Neg Hx   . Colon polyps Neg Hx      Current Outpatient Medications (Cardiovascular):  .  amLODipine (NORVASC) 2.5 MG tablet, Take 1 tablet (2.5 mg total) by mouth daily. Marland Kitchen  atorvastatin (LIPITOR) 20 MG tablet, Take 1 tablet (20 mg total) by mouth daily. Marland Kitchen  losartan (COZAAR) 100 MG tablet, Take 1 tablet (100 mg total) by mouth  daily.     Current Outpatient Medications (Other):  Marland Kitchen  ALPRAZolam (XANAX) 0.25 MG tablet, Take 0.25-0.5 mg by mouth daily. Marland Kitchen  FLUoxetine (PROZAC) 40 MG capsule, Take 40 mg by mouth daily. .  mupirocin ointment (BACTROBAN) 2 %, Place 1 application into the nose 2 (two) times daily. Marland Kitchen  omeprazole (PRILOSEC) 40 MG capsule, Take 1 capsule (40 mg total) by mouth daily. PLEASE SCHEDULE FOLLOW UP APPOINTMENT .  valACYclovir (VALTREX) 500 MG tablet, Take 500 mg by mouth as needed.  .  Vitamin D, Ergocalciferol, (DRISDOL) 1.25 MG (50000 UNIT) CAPS  capsule, TAKE 1 CAPSULE (50,000 UNITS TOTAL) BY MOUTH EVERY 7 (SEVEN) DAYS. Marland Kitchen  zolpidem (AMBIEN) 5 MG tablet, Take 0.5-1 tablets (2.5-5 mg total) by mouth at bedtime.   Reviewed prior external information including notes and imaging from  primary care provider As well as notes that were available from care everywhere and other healthcare systems.  Past medical history, social, surgical and family history all reviewed in electronic medical record.  No pertanent information unless stated regarding to the chief complaint.   Review of Systems:  No headache, visual changes, nausea, vomiting, diarrhea, constipation, dizziness, abdominal pain, skin rash, fevers, chills, night sweats, weight loss, swollen lymph nodes, body aches, joint swelling, chest pain, shortness of breath, mood changes. POSITIVE muscle aches  Objective  Blood pressure 124/80, pulse 64, height 5\' 2"  (1.575 m), weight 166 lb (75.3 kg), SpO2 96 %.   General: No apparent distress alert and oriented x3 mood and affect normal, dressed appropriately.  HEENT: Pupils equal, extraocular movements intact  Respiratory: Patient's speak in full sentences and does not appear short of breath  Cardiovascular: No lower extremity edema, non tender, no erythema  Neuro: Cranial nerves II through XII are intact, neurovascularly intact in all extremities with 2+ DTRs and 2+ pulses.  Gait normal with good  balance and coordination.  MSK:   Left shoulder exam shows very mild atrophy of the shoulder girdle compared to the contralateral side but 5 out of 5 strength of the rotator cuff.  Positive Hawkins and Neer's and positive crossover.  Pain over the acromioclavicular joint.  Procedure: Real-time Ultrasound Guided Injection left acromioclavicular joint Device: GE Logiq Q7 Ultrasound guided injection is preferred based studies that show increased duration, increased effect, greater accuracy, decreased procedural pain, increased response rate, and decreased cost with ultrasound guided versus blind injection.  Verbal informed consent obtained.  Time-out conducted.  Noted no overlying erythema, induration, or other signs of local infection.  Skin prepped in a sterile fashion.  Local anesthesia: Topical Ethyl chloride.  With sterile technique and under real time ultrasound guidance: With a 25-gauge half inch needle injected with 0.5 cc of 0.5% Marcaine and 0.5 cc of Kenalog 40 mg/mL into the left acromioclavicular joint (superior angle Completed without difficulty  Pain immediately resolved suggesting accurate placement of the medication.  Advised to call if fevers/chills, erythema, induration, drainage, or persistent bleeding.   Impression: Technically successful ultrasound guided injection.    Impression and Recommendations:     The above documentation has been reviewed and is accurate and complete Lyndal Pulley, DO       Note: This dictation was prepared with Dragon dictation along with smaller phrase technology. Any transcriptional errors that result from this process are unintentional.

## 2019-11-19 ENCOUNTER — Ambulatory Visit: Payer: Medicare HMO | Admitting: Family Medicine

## 2019-11-23 ENCOUNTER — Other Ambulatory Visit: Payer: Self-pay | Admitting: Gastroenterology

## 2019-11-26 ENCOUNTER — Telehealth (INDEPENDENT_AMBULATORY_CARE_PROVIDER_SITE_OTHER): Payer: Medicare HMO | Admitting: Family Medicine

## 2019-11-26 DIAGNOSIS — R300 Dysuria: Secondary | ICD-10-CM

## 2019-11-26 MED ORDER — NITROFURANTOIN MONOHYD MACRO 100 MG PO CAPS
100.0000 mg | ORAL_CAPSULE | Freq: Two times a day (BID) | ORAL | 0 refills | Status: DC
Start: 2019-11-26 — End: 2020-01-06

## 2019-11-26 NOTE — Progress Notes (Signed)
Virtual Visit via Video Note  I connected with Porschea  on 11/26/19 at 10:20 AM EDT by a video enabled telemedicine application and verified that I am speaking with the correct person using two identifiers.  Location patient: home, [place] Location provider:work or home office Persons participating in the virtual visit: patient, provider  I discussed the limitations of evaluation and management by telemedicine and the availability of in person appointments. The patient expressed understanding and agreed to proceed.   HPI:  Acute telemedicine visit for Dysuria: -Onset: about 2 weeks ago -Symptoms include:frequency, urgency, dysuria -Denies: fevers, flank pain, NVD, abd pain, hematuria, vaginal symptoms -Has tried: none -Pertinent past medical history: UTI and this feels similar - has been several years -Pertinent medication allergies: latex, bacitracin oint  ROS: See pertinent positives and negatives per HPI.  Past Medical History:  Diagnosis Date  . Anxiety   . Cavus foot, acquired 09/09/2012    bilateral cavus feet the one on the left shows that she has some probable neurogenic weakness with abnormal curvature and in inversion contracture Podiatry 06/2015: H&P and x-ray reviewed with patient. Today I went ahead and I did a proximal nerve block I was able to aspirate the second MPJ and got out amount of clear fluid and injected with a quarter cc dexamethasone Kenalog and ap  . DDD (degenerative disc disease), lumbar   . Depression   . Endometrial polyp   . GERD (gastroesophageal reflux disease)   . Hiatal hernia   . Hip flexor tendinitis, right 07/02/2016  . History of adenomatous polyp of colon   . History of esophageal stricture   . History of gastric polyp 11/2017  . History of primary hyperparathyroidism    s/p  left parathryoidectomy 09-17-2008-- resolved  . Hyperlipidemia   . Hypertension   . Insomnia   . Lumbar stenosis   . Osteopenia   . Schatzki's ring   . Scoliosis   .  Tendinopathy of right biceps tendon 07/02/2016  . Thickened endometrium   . Uterine fibroid   . Wears glasses     Past Surgical History:  Procedure Laterality Date  . BREAST REDUCTION SURGERY Bilateral 2000  . COLONOSCOPY    . CYST EXCISION  1985   head and rt axilla  . DILATATION & CURETTAGE/HYSTEROSCOPY WITH MYOSURE N/A 08/13/2018   Procedure: DILATATION & CURETTAGE/HYSTEROSCOPY WITH MYOSURE;  Surgeon: Princess Bruins, MD;  Location: [facility];  Service: Gynecology;  Laterality: N/A;  requests 10:15am in [place] Gyn block request 30 minutes  . DILATION AND CURETTAGE OF UTERUS N/A 08/13/2018  . ECTOPIC PREGNANCY SURGERY  1981  . PARATHYROIDECTOMY Left 09/17/2008   dr Ronnald Collum  @[facility]    left superior  (adenoma)  . SHOULDER SURGERY Right 2007   "frozen"  . UPPER GASTROINTESTINAL ENDOSCOPY  last one 12-18-2017   dr Rush Landmark     Current Outpatient Medications:  .  ALPRAZolam (XANAX) 0.25 MG tablet, Take 0.25-0.5 mg by mouth daily., Disp: , Rfl:  .  amLODipine (NORVASC) 2.5 MG tablet, Take 1 tablet (2.5 mg total) by mouth daily., Disp: 90 tablet, Rfl: 1 .  atorvastatin (LIPITOR) 20 MG tablet, Take 1 tablet (20 mg total) by mouth daily., Disp: 90 tablet, Rfl: 3 .  FLUoxetine (PROZAC) 40 MG capsule, Take 40 mg by mouth daily., Disp: , Rfl:  .  losartan (COZAAR) 100 MG tablet, Take 1 tablet (100 mg total) by mouth daily., Disp: 90 tablet, Rfl: 1 .  mupirocin ointment (BACTROBAN) 2 %,  Place 1 application into the nose 2 (two) times daily., Disp: 22 g, Rfl: 0 .  nitrofurantoin, macrocrystal-monohydrate, (MACROBID) 100 MG capsule, Take 1 capsule (100 mg total) by mouth 2 (two) times daily., Disp: 14 capsule, Rfl: 0 .  omeprazole (PRILOSEC) 40 MG capsule, Take 1 capsule (40 mg total) by mouth daily. PLEASE SCHEDULE FOLLOW UP APPOINTMENT, Disp: 90 capsule, Rfl: 0 .  valACYclovir (VALTREX) 500 MG tablet, Take 500 mg by mouth as needed. , Disp: , Rfl:  .  Vitamin D,  Ergocalciferol, (DRISDOL) 1.25 MG (50000 UNIT) CAPS capsule, TAKE 1 CAPSULE (50,000 UNITS TOTAL) BY MOUTH EVERY 7 (SEVEN) DAYS., Disp: 12 capsule, Rfl: 0 .  zolpidem (AMBIEN) 5 MG tablet, Take 0.5-1 tablets (2.5-5 mg total) by mouth at bedtime., Disp: 90 tablet, Rfl: 1  EXAM:  VITALS per patient if applicable:  GENERAL: alert, oriented, appears well and in no acute distress  HEENT: atraumatic, conjunttiva clear, no obvious abnormalities on inspection of external nose and ears  NECK: normal movements of the head and neck  LUNGS: on inspection no signs of respiratory distress, breathing rate appears normal, no obvious gross SOB, gasping or wheezing  CV: no obvious cyanosis  MS: moves all visible extremities without noticeable abnormality  PSYCH/NEURO: pleasant and cooperative, no obvious depression or anxiety, speech and thought processing grossly intact  ASSESSMENT AND PLAN:  Discussed the following assessment and plan:  Dysuria  -we discussed possible serious and likely etiologies, options for evaluation and workup, limitations of telemedicine visit vs in person visit, treatment, treatment risks and precautions. Pt prefers to treat via telemedicine empirically rather than in person at this moment. She opted for empiric treatment with Macrobid 100 mg twice daily for 7 days for a possible uncomplicated cystitis.  Discussed the potential for other etiologies or resistant organisms.  Advised to seek prompt in person care if worsening, new symptoms arise, or if is not improving with treatment. Discussed options for inperson care if PCP office not available. Did let this patient know that I only do telemedicine on Tuesdays and Thursdays for [place]. Advised to schedule follow up visit with PCP or [facility] if any further questions or concerns to avoid delays in care.   I discussed the assessment and treatment plan with the patient. The patient was provided an opportunity to ask questions and all  were answered. The patient agreed with the plan and demonstrated an understanding of the instructions.     Lucretia Kern, DO

## 2019-11-26 NOTE — Patient Instructions (Addendum)
-  I sent the medication(s) we discussed to your pharmacy: Meds ordered this encounter  Medications  . nitrofurantoin, macrocrystal-monohydrate, (MACROBID) 100 MG capsule    Sig: Take 1 capsule (100 mg total) by mouth 2 (two) times daily.    Dispense:  14 capsule    Refill:  0   Drink plenty of water.  Cranberry juice can also be helpful.  I hope you are feeling better soon!  Seek in person care promptly if your symptoms worsen, new concerns arise or you are not improving with treatment.  It was nice to meet you today. I help [facility] out with telemedicine visits on Tuesdays and Thursdays and am available for visits on those days. If you have any concerns or questions following this visit please schedule a follow up visit with your Primary Care doctor or seek care at a local urgent care clinic to avoid delays in care.

## 2019-11-27 ENCOUNTER — Ambulatory Visit: Payer: Medicare HMO | Admitting: Family Medicine

## 2019-12-02 DIAGNOSIS — M545 Low back pain, unspecified: Secondary | ICD-10-CM | POA: Diagnosis not present

## 2019-12-02 DIAGNOSIS — M25512 Pain in left shoulder: Secondary | ICD-10-CM | POA: Diagnosis not present

## 2019-12-02 DIAGNOSIS — M48062 Spinal stenosis, lumbar region with neurogenic claudication: Secondary | ICD-10-CM | POA: Diagnosis not present

## 2019-12-09 NOTE — Progress Notes (Signed)
[facility] [place] Phone: [phone] Subjective:   Rito Ehrlich, am serving as a scribe for Dr. Hulan Saas. This visit occurred during the SARS-CoV-2 public health emergency.  Safety protocols were in place, including screening questions prior to the visit, additional usage of staff PPE, and extensive cleaning of exam room while observing appropriate contact time as indicated for disinfecting solutions.   I'm seeing this patient by the request  of:  Kuneff, Renee A, DO  CC: Left shoulder pain  MGQ:[id number]   10/22/2019 Patient given injection today.  We will see how much this responds.  X-rays are pending.  I am hoping that this was the rest of patient's pain and will continue to improve.  Follow-up with me again in 6 to 8 weeks  I believe the patient has made some improvement.  Patient does have some arthritic changes of the acromioclavicular joint noted and I did get an injection today.  X-rays are pending.  Worsening symptoms will need advanced imaging follow-up again in 6 to 8 weeks   Update 12/10/2019 KATRISHA SEGALL is a 69 y.o. female coming in with complaint of left shoulder pain.  Patient has been found to have what seems to be more like a frozen shoulder.  We will get an injection at last visit. Patient states she has been doing dry needling, two sessions and the shoulder has been feeling better.    Patient did have x-rays previously obtained came and they were independently visualized by me.  Mild acromioclavicular arthritis otherwise fairly unremarkable  Past Medical History:  Diagnosis Date  . Anxiety   . Cavus foot, acquired 09/09/2012    bilateral cavus feet the one on the left shows that she has some probable neurogenic weakness with abnormal curvature and in inversion contracture Podiatry 06/2015: H&P and x-ray reviewed with patient. Today I went ahead and I did a proximal nerve block I was able to  aspirate the second MPJ and got out amount of clear fluid and injected with a quarter cc dexamethasone Kenalog and ap  . DDD (degenerative disc disease), lumbar   . Depression   . Endometrial polyp   . GERD (gastroesophageal reflux disease)   . Hiatal hernia   . Hip flexor tendinitis, right 07/02/2016  . History of adenomatous polyp of colon   . History of esophageal stricture   . History of gastric polyp 11/2017  . History of primary hyperparathyroidism    s/p  left parathryoidectomy 09-17-2008-- resolved  . Hyperlipidemia   . Hypertension   . Insomnia   . Lumbar stenosis   . Osteopenia   . Schatzki's ring   . Scoliosis   . Tendinopathy of right biceps tendon 07/02/2016  . Thickened endometrium   . Uterine fibroid   . Wears glasses    Past Surgical History:  Procedure Laterality Date  . BREAST REDUCTION SURGERY Bilateral 2000  . COLONOSCOPY    . CYST EXCISION  1985   head and rt axilla  . DILATATION & CURETTAGE/HYSTEROSCOPY WITH MYOSURE N/A 08/13/2018   Procedure: DILATATION & CURETTAGE/HYSTEROSCOPY WITH MYOSURE;  Surgeon: Princess Bruins, MD;  Location: [facility];  Service: Gynecology;  Laterality: N/A;  requests 10:15am in [facility] block request 30 minutes  . DILATION AND CURETTAGE OF UTERUS N/A 08/13/2018  . ECTOPIC PREGNANCY SURGERY  1981  . PARATHYROIDECTOMY Left 09/17/2008   dr Ronnald Collum  @[facility]    left superior  (adenoma)  .  SHOULDER SURGERY Right 2007   "frozen"  . UPPER GASTROINTESTINAL ENDOSCOPY  last one 12-18-2017   dr Rush Landmark   Social History   Socioeconomic History  . Marital status: Married    Spouse name: Not on file  . Number of children: 1  . Years of education: Not on file  . Highest education level: Not on file  Occupational History  . Not on file  Tobacco Use  . Smoking status: Former Smoker    Years: 15.00    Types: Cigarettes    Quit date: 01/30/1991    Years since quitting: 28.8  . Smokeless tobacco: Never Used    Vaping Use  . Vaping Use: Never used  Substance and Sexual Activity  . Alcohol use: Not Currently  . Drug use: Never  . Sexual activity: Not Currently    Birth control/protection: Post-menopausal    Comment: 1st intercourse- 59, partners- 52, married- 82 yrs   Other Topics Concern  . Not on file  Social History Narrative   Lives with husband. Feels safe at home. 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 [employer]. Some college. Former smoker. Wears seat belt.    Social Determinants of Health   Financial Resource Strain:   . Difficulty of Paying Living Expenses: Not on file  Food Insecurity:   . Worried About Charity fundraiser in the Last Year: Not on file  . Ran Out of Food in the Last Year: Not on file  Transportation Needs:   . Lack of Transportation (Medical): Not on file  . Lack of Transportation (Non-Medical): Not on file  Physical Activity:   . Days of Exercise per Week: Not on file  . Minutes of Exercise per Session: Not on file  Stress:   . Feeling of Stress : Not on file  Social Connections:   . Frequency of Communication with Friends and Family: Not on file  . Frequency of Social Gatherings with Friends and Family: Not on file  . Attends Religious Services: Not on file  . Active Member of Clubs or Organizations: Not on file  . Attends Archivist Meetings: Not on file  . Marital Status: Not on file   Allergies  Allergen Reactions  . Latex Itching and Rash  . Polysporin [Bacitracin-Polymyxin B] Rash   Family History  Problem Relation Age of Onset  . Heart disease Father 63  . Breast cancer Mother   . Hypertension Brother   . Hypertension Sister   . Breast cancer Maternal Aunt   . Colon cancer Neg Hx   . Esophageal cancer Neg Hx   . Stomach cancer Neg Hx   . Rectal cancer Neg Hx   . Colon polyps Neg Hx      Current Outpatient Medications (Cardiovascular):  .  amLODipine (NORVASC) 2.5 MG tablet, Take 1 tablet (2.5 mg total) by mouth daily. Marland Kitchen   atorvastatin (LIPITOR) 20 MG tablet, Take 1 tablet (20 mg total) by mouth daily. Marland Kitchen  losartan (COZAAR) 100 MG tablet, Take 1 tablet (100 mg total) by mouth daily.     Current Outpatient Medications (Other):  Marland Kitchen  ALPRAZolam (XANAX) 0.25 MG tablet, Take 0.25-0.5 mg by mouth daily. Marland Kitchen  FLUoxetine (PROZAC) 40 MG capsule, Take 40 mg by mouth daily. .  mupirocin ointment (BACTROBAN) 2 %, Place 1 application into the nose 2 (two) times daily. .  nitrofurantoin, macrocrystal-monohydrate, (MACROBID) 100 MG capsule, Take 1 capsule (100 mg total) by mouth 2 (two) times daily. Marland Kitchen  omeprazole (  PRILOSEC) 40 MG capsule, Take 1 capsule (40 mg total) by mouth daily. PLEASE SCHEDULE FOLLOW UP APPOINTMENT .  valACYclovir (VALTREX) 500 MG tablet, Take 500 mg by mouth as needed.  .  Vitamin D, Ergocalciferol, (DRISDOL) 1.25 MG (50000 UNIT) CAPS capsule, TAKE 1 CAPSULE (50,000 UNITS TOTAL) BY MOUTH EVERY 7 (SEVEN) DAYS. Marland Kitchen  zolpidem (AMBIEN) 5 MG tablet, Take 0.5-1 tablets (2.5-5 mg total) by mouth at bedtime.   Reviewed prior external information including notes and imaging from  primary care provider As well as notes that were available from care everywhere and other healthcare systems.  Past medical history, social, surgical and family history all reviewed in electronic medical record.  No pertanent information unless stated regarding to the chief complaint.   Review of Systems:  No headache, visual changes, nausea, vomiting, diarrhea, constipation, dizziness, abdominal pain, skin rash, fevers, chills, night sweats, weight loss, swollen lymph nodes, body aches, joint swelling, chest pain, shortness of breath, mood changes. POSITIVE muscle aches  Objective  Blood pressure 112/80, pulse 61, height 5\' 2"  (1.575 m), weight 165 lb (74.8 kg), SpO2 97 %.   General: No apparent distress alert and oriented x3 mood and affect normal, dressed appropriately.  HEENT: Pupils equal, extraocular movements intact    Respiratory: Patient's speak in full sentences and does not appear short of breath  Cardiovascular: No lower extremity edema, non tender, no erythema  Left shoulder exam shows the patient does have improvement in range of motion.  Patient still lacks last 5 degrees of flexion in the last 5 degrees of external rotation good strength of the rotator cuff noted     Impression and Recommendations:    The above documentation has been reviewed and is accurate and complete Lyndal Pulley, DO

## 2019-12-10 ENCOUNTER — Ambulatory Visit: Payer: Medicare HMO | Admitting: Family Medicine

## 2019-12-10 ENCOUNTER — Other Ambulatory Visit: Payer: Self-pay

## 2019-12-10 ENCOUNTER — Encounter: Payer: Self-pay | Admitting: Family Medicine

## 2019-12-10 DIAGNOSIS — M19012 Primary osteoarthritis, left shoulder: Secondary | ICD-10-CM | POA: Diagnosis not present

## 2019-12-10 DIAGNOSIS — M7502 Adhesive capsulitis of left shoulder: Secondary | ICD-10-CM

## 2019-12-10 NOTE — Assessment & Plan Note (Signed)
Improvement with injection.  We will continue to monitor as well.  Follow-up in 2 months

## 2019-12-10 NOTE — Assessment & Plan Note (Signed)
Patient is improving significantly at this time.  Continue with the home exercises, range of motion, which activities to do which wants to avoid.  Follow-up again 2 months

## 2019-12-10 NOTE — Patient Instructions (Addendum)
Good to see you  So glad you are doing better Keep watching over your worser half for me.  Keep working on the range of motion  See me again in 8 weeks if not perfect  Happy holidays!

## 2019-12-15 DIAGNOSIS — R69 Illness, unspecified: Secondary | ICD-10-CM | POA: Diagnosis not present

## 2019-12-16 DIAGNOSIS — M48062 Spinal stenosis, lumbar region with neurogenic claudication: Secondary | ICD-10-CM | POA: Diagnosis not present

## 2019-12-16 DIAGNOSIS — M545 Low back pain, unspecified: Secondary | ICD-10-CM | POA: Diagnosis not present

## 2019-12-16 DIAGNOSIS — D225 Melanocytic nevi of trunk: Secondary | ICD-10-CM | POA: Diagnosis not present

## 2019-12-16 DIAGNOSIS — L720 Epidermal cyst: Secondary | ICD-10-CM | POA: Diagnosis not present

## 2019-12-16 DIAGNOSIS — M25512 Pain in left shoulder: Secondary | ICD-10-CM | POA: Diagnosis not present

## 2019-12-16 DIAGNOSIS — D2262 Melanocytic nevi of left upper limb, including shoulder: Secondary | ICD-10-CM | POA: Diagnosis not present

## 2019-12-16 DIAGNOSIS — L821 Other seborrheic keratosis: Secondary | ICD-10-CM | POA: Diagnosis not present

## 2020-01-05 ENCOUNTER — Other Ambulatory Visit: Payer: Self-pay

## 2020-01-05 DIAGNOSIS — B952 Enterococcus as the cause of diseases classified elsewhere: Secondary | ICD-10-CM | POA: Insufficient documentation

## 2020-01-05 DIAGNOSIS — N390 Urinary tract infection, site not specified: Secondary | ICD-10-CM | POA: Insufficient documentation

## 2020-01-05 HISTORY — DX: Enterococcus as the cause of diseases classified elsewhere: N39.0

## 2020-01-05 HISTORY — DX: Enterococcus as the cause of diseases classified elsewhere: B95.2

## 2020-01-06 ENCOUNTER — Ambulatory Visit (INDEPENDENT_AMBULATORY_CARE_PROVIDER_SITE_OTHER): Payer: Medicare HMO | Admitting: Family Medicine

## 2020-01-06 ENCOUNTER — Encounter: Payer: Self-pay | Admitting: Family Medicine

## 2020-01-06 ENCOUNTER — Telehealth: Payer: Self-pay

## 2020-01-06 VITALS — BP 121/78 | HR 70 | Temp 98.3°F | Ht 62.25 in | Wt 161.0 lb

## 2020-01-06 DIAGNOSIS — E785 Hyperlipidemia, unspecified: Secondary | ICD-10-CM | POA: Diagnosis not present

## 2020-01-06 DIAGNOSIS — K2100 Gastro-esophageal reflux disease with esophagitis, without bleeding: Secondary | ICD-10-CM | POA: Diagnosis not present

## 2020-01-06 DIAGNOSIS — Z131 Encounter for screening for diabetes mellitus: Secondary | ICD-10-CM | POA: Diagnosis not present

## 2020-01-06 DIAGNOSIS — R1030 Lower abdominal pain, unspecified: Secondary | ICD-10-CM

## 2020-01-06 DIAGNOSIS — Z0001 Encounter for general adult medical examination with abnormal findings: Secondary | ICD-10-CM | POA: Diagnosis not present

## 2020-01-06 DIAGNOSIS — G4700 Insomnia, unspecified: Secondary | ICD-10-CM

## 2020-01-06 DIAGNOSIS — Z1231 Encounter for screening mammogram for malignant neoplasm of breast: Secondary | ICD-10-CM

## 2020-01-06 DIAGNOSIS — F418 Other specified anxiety disorders: Secondary | ICD-10-CM

## 2020-01-06 DIAGNOSIS — I10 Essential (primary) hypertension: Secondary | ICD-10-CM

## 2020-01-06 DIAGNOSIS — M8580 Other specified disorders of bone density and structure, unspecified site: Secondary | ICD-10-CM | POA: Diagnosis not present

## 2020-01-06 DIAGNOSIS — R69 Illness, unspecified: Secondary | ICD-10-CM | POA: Diagnosis not present

## 2020-01-06 LAB — CBC WITH DIFFERENTIAL/PLATELET
Basophils Absolute: 0.2 10*3/uL — ABNORMAL HIGH (ref 0.0–0.1)
Basophils Relative: 3.3 % — ABNORMAL HIGH (ref 0.0–3.0)
Eosinophils Absolute: 0.1 10*3/uL (ref 0.0–0.7)
Eosinophils Relative: 2 % (ref 0.0–5.0)
HCT: 39.6 % (ref 36.0–46.0)
Hemoglobin: 13.1 g/dL (ref 12.0–15.0)
Lymphocytes Relative: 35.4 % (ref 12.0–46.0)
Lymphs Abs: 1.8 10*3/uL (ref 0.7–4.0)
MCHC: 33.1 g/dL (ref 30.0–36.0)
MCV: 87.3 fl (ref 78.0–100.0)
Monocytes Absolute: 0.5 10*3/uL (ref 0.1–1.0)
Monocytes Relative: 8.8 % (ref 3.0–12.0)
Neutro Abs: 2.6 10*3/uL (ref 1.4–7.7)
Neutrophils Relative %: 50.5 % (ref 43.0–77.0)
Platelets: 320 10*3/uL (ref 150.0–400.0)
RBC: 4.53 Mil/uL (ref 3.87–5.11)
RDW: 14.1 % (ref 11.5–15.5)
WBC: 5.2 10*3/uL (ref 4.0–10.5)

## 2020-01-06 LAB — LIPID PANEL
Cholesterol: 159 mg/dL (ref 0–200)
HDL: 53.6 mg/dL (ref >39.00)
LDL Cholesterol: 92 mg/dL (ref 0–99)
NonHDL: 105.3
Total CHOL/HDL Ratio: 3
Triglycerides: 69 mg/dL (ref 0.0–149.0)
VLDL: 13.8 mg/dL (ref 0.0–40.0)

## 2020-01-06 LAB — COMPREHENSIVE METABOLIC PANEL
ALT: 28 U/L (ref 0–35)
AST: 21 U/L (ref 0–37)
Albumin: 3.8 g/dL (ref 3.5–5.2)
Alkaline Phosphatase: 58 U/L (ref 39–117)
BUN: 12 mg/dL (ref 6–23)
CO2: 28 mEq/L (ref 19–32)
Calcium: 9.2 mg/dL (ref 8.4–10.5)
Chloride: 106 mEq/L (ref 96–112)
Creatinine, Ser: 0.73 mg/dL (ref 0.40–1.20)
GFR: 83.41 mL/min (ref >60.00)
Glucose, Bld: 95 mg/dL (ref 70–99)
Potassium: 4.2 mEq/L (ref 3.5–5.1)
Sodium: 140 mEq/L (ref 135–145)
Total Bilirubin: 0.7 mg/dL (ref 0.2–1.2)
Total Protein: 6.2 g/dL (ref 6.0–8.3)

## 2020-01-06 LAB — VITAMIN D 25 HYDROXY (VIT D DEFICIENCY, FRACTURES): VITD: 30.71 ng/mL (ref 30.00–100.00)

## 2020-01-06 LAB — C-REACTIVE PROTEIN: CRP: 5.1 mg/dL (ref 0.5–20.0)

## 2020-01-06 LAB — HEMOGLOBIN A1C: Hgb A1c MFr Bld: 5.6 % (ref 4.6–6.5)

## 2020-01-06 LAB — TSH: TSH: 1.62 u[IU]/mL (ref 0.35–4.50)

## 2020-01-06 MED ORDER — ZOLPIDEM TARTRATE 5 MG PO TABS
2.5000 mg | ORAL_TABLET | Freq: Every day | ORAL | 1 refills | Status: DC
Start: 2020-01-06 — End: 2020-11-29

## 2020-01-06 MED ORDER — ATORVASTATIN CALCIUM 20 MG PO TABS: 20.0000 mg | ORAL_TABLET | Freq: Every day | ORAL | 3 refills | Status: DC

## 2020-01-06 MED ORDER — LOSARTAN POTASSIUM 100 MG PO TABS: 100.0000 mg | ORAL_TABLET | Freq: Every day | ORAL | 1 refills | Status: DC

## 2020-01-06 MED ORDER — OMEPRAZOLE 40 MG PO CPDR
40.0000 mg | DELAYED_RELEASE_CAPSULE | Freq: Every day | ORAL | 3 refills | Status: DC
Start: 2020-01-06 — End: 2020-11-29

## 2020-01-06 NOTE — Patient Instructions (Signed)
Health Maintenance After Age 70 After age 70, you are at a higher risk for certain long-term diseases and infections as well as injuries from falls. Falls are a major cause of broken bones and head injuries in people who are older than age 70. Getting regular preventive care can help to keep you healthy and well. Preventive care includes getting regular testing and making lifestyle changes as recommended by your health care provider. Talk with your health care provider about:  Which screenings and tests you should have. A screening is a test that checks for a disease when you have no symptoms.  A diet and exercise plan that is right for you. What should I know about screenings and tests to prevent falls? Screening and testing are the best ways to find a health problem early. Early diagnosis and treatment give you the best chance of managing medical conditions that are common after age 70. Certain conditions and lifestyle choices may make you more likely to have a fall. Your health care provider may recommend:  Regular vision checks. Poor vision and conditions such as cataracts can make you more likely to have a fall. If you wear glasses, make sure to get your prescription updated if your vision changes.  Medicine review. Work with your health care provider to regularly review all of the medicines you are taking, including over-the-counter medicines. Ask your health care provider about any side effects that may make you more likely to have a fall. Tell your health care provider if any medicines that you take make you feel dizzy or sleepy.  Osteoporosis screening. Osteoporosis is a condition that causes the bones to get weaker. This can make the bones weak and cause them to break more easily.  Blood pressure screening. Blood pressure changes and medicines to control blood pressure can make you feel dizzy.  Strength and balance checks. Your health care provider may recommend certain tests to check your  strength and balance while standing, walking, or changing positions.  Foot health exam. Foot pain and numbness, as well as not wearing proper footwear, can make you more likely to have a fall.  Depression screening. You may be more likely to have a fall if you have a fear of falling, feel emotionally low, or feel unable to do activities that you used to do.  Alcohol use screening. Using too much alcohol can affect your balance and may make you more likely to have a fall. What actions can I take to lower my risk of falls? General instructions  Talk with your health care provider about your risks for falling. Tell your health care provider if: ? You fall. Be sure to tell your health care provider about all falls, even ones that seem minor. ? You feel dizzy, sleepy, or off-balance.  Take over-the-counter and prescription medicines only as told by your health care provider. These include any supplements.  Eat a healthy diet and maintain a healthy weight. A healthy diet includes low-fat dairy products, low-fat (lean) meats, and fiber from whole grains, beans, and lots of fruits and vegetables. Home safety  Remove any tripping hazards, such as rugs, cords, and clutter.  Install safety equipment such as grab bars in bathrooms and safety rails on stairs.  Keep rooms and walkways well-lit. Activity   Follow a regular exercise program to stay fit. This will help you maintain your balance. Ask your health care provider what types of exercise are appropriate for you.  If you need a cane or   walker, use it as recommended by your health care provider.  Wear supportive shoes that have nonskid soles. Lifestyle  Do not drink alcohol if your health care provider tells you not to drink.  If you drink alcohol, limit how much you have: ? 0-1 drink a day for women. ? 0-2 drinks a day for men.  Be aware of how much alcohol is in your drink. In the [place] one drink equals one typical bottle of beer (12  oz), one-half glass of wine (5 oz), or one shot of hard liquor (1 oz).  Do not use any products that contain nicotine or tobacco, such as cigarettes and e-cigarettes. If you need help quitting, ask your health care provider. Summary  Having a healthy lifestyle and getting preventive care can help to protect your health and wellness after age 70.  Screening and testing are the best way to find a health problem early and help you avoid having a fall. Early diagnosis and treatment give you the best chance for managing medical conditions that are more common for people who are older than age 70.  Falls are a major cause of broken bones and head injuries in people who are older than age 70. Take precautions to prevent a fall at home.  Work with your health care provider to learn what changes you can make to improve your health and wellness and to prevent falls. This information is not intended to replace advice given to you by your health care provider. Make sure you discuss any questions you have with your health care provider. Document Revised: 05/08/2018 Document Reviewed: 11/28/2016 Elsevier Patient Education  2020 [employer]  

## 2020-01-06 NOTE — Telephone Encounter (Signed)
Left message for pt to call or come by for [facility] drop off. It is ok for Pt to drop off with lab orders are already in.

## 2020-01-06 NOTE — Progress Notes (Signed)
This visit occurred during the SARS-CoV-2 public health emergency.  Safety protocols were in place, including screening questions prior to the visit, additional usage of staff PPE, and extensive cleaning of exam room while observing appropriate contact time as indicated for disinfecting solutions.    Patient ID: Rebecca Long, female  DOB: 05/18/1950, 69 y.o.   MRN: [id number] Patient Care Team    Relationship Specialty Notifications Start End  Ma Hillock, DO PCP - General Family Medicine  10/06/14   Wallene Huh, [place] Consulting Physician Podiatry  10/18/15   Sydnee Levans, MD Consulting Physician Dermatology  10/18/15   Stefanie Libel, MD Consulting Physician Sports Medicine  11/01/15   Ladene Artist, MD Consulting Physician Gastroenterology  11/04/17   Rutherford Guys, MD Consulting Physician Ophthalmology  06/17/18   Princess Bruins, MD Consulting Physician Obstetrics and Gynecology  06/17/18   Eino Farber, PA-C Physician Assistant Psychiatry  06/17/18    Comment: [facility]    Chief Complaint  Patient presents with  . Annual Exam    pt is fasting    Subjective:  Rebecca Long is a 69 y.o.  Female  present for CPE. All past medical history, surgical history, allergies, family history, immunizations, medications and social history were updated in the electronic medical record today. All recent labs, ED visits and hospitalizations within the last year were reviewed.  Health maintenance:  Colonoscopy: 08/21/2018, 3 year follow up. Dr. Fuller Plan Mammogram (50-74): 05/2019; at [facility] in [place] Cervical cancer screening(<65):N/A- following with GYN for urinary incontinence, fibroid and poss.uterine hyperplasia Immunizations: tdap UTD 2016, PNA series completed, shingrix completed, covid series completed with booster, flu shot UTD 2021 Infectious disease screening: completed 2017 DEXA: 09/2018, 3 yr rpt- osteopenia -1.8 Glaucoma screen:Yearly at [facility] eye Assistive device:  none Oxygen PQZ:RAQT Patient has a Dental home. Hospitalizations/ED visits: reviewed  Hypertension/hyperlipidemia/overweight:  Pt reports compliance  with Losartan 100 mg daily, Lipitor 10 mg daily and daily baby aspirin. She ha snot needed the amlodipine.Patient denies chest pain, shortness of breath, dizziness or lower extremity edema.  Pt endorses use of daily baby ASA. Pt is prescribed statin. Exercise: exercise and pickle ball.  RF: HTN, HLD, Fhx Heart disease  Insomnia: Patient reports she has been prescribed Ambien for many many years.  She would like to have this prescription taken over by this provider.  She reports she takes Ambien 2.5 mg nightly routinely and occasionally if still unable to fall asleep will take a full Ambien 5 mg.pt reports condition is stable.    Depression screen [facility] 2/9 01/06/2020 07/23/2019 06/17/2018 11/04/2017 05/15/2017  Decreased Interest 0 0 3 0 1  Down, Depressed, Hopeless 0 0 3 0 1  PHQ - 2 Score 0 0 6 0 2  Altered sleeping - 0 1 - 1  Tired, decreased energy - 3 3 - 2  Change in appetite - 0 0 - 2  Feeling bad or failure about yourself  - 1 1 - 1  Trouble concentrating - 3 1 - 1  Moving slowly or fidgety/restless - 0 0 - 0  Suicidal thoughts - 0 0 - 0  PHQ-9 Score - 7 12 - 9  Difficult doing work/chores - Not difficult at all Somewhat difficult - Not difficult at all   GAD 7 : Generalized Anxiety Score 07/23/2019 06/17/2018 05/15/2017  Nervous, Anxious, on Edge 1 3 1   Control/stop worrying 0 3 1  Worry too much - different things 0 3 1  Trouble relaxing 0 3 1  Restless 0 0 1  Easily annoyed or irritable 0 3 1  Afraid - awful might happen 0 3 1  Total GAD 7 Score 1 18 7   Anxiety Difficulty Not difficult at all Somewhat difficult Not difficult at all    Immunization History  Administered Date(s) Administered  . Hepatitis A, Adult 11/30/2016  . Influenza, High Dose Seasonal PF 10/18/2015, 11/12/2016, 10/18/2017, 09/15/2018  .  Influenza,inj,Quad PF,6+ Mos 10/15/2012, 10/06/2014  . Influenza-Unspecified 10/15/2012, 10/14/2019  . PFIZER SARS-COV-2 Vaccination 02/19/2019, 03/12/2019, 10/12/2019  . Pneumococcal Conjugate-13 10/29/2014  . Pneumococcal Polysaccharide-23 11/01/2015  . Td 10/24/2004  . Tdap 10/29/2014  . Zoster 01/30/2007  . Zoster Recombinat (Shingrix) 04/11/2017, 07/25/2017     Past Medical History:  Diagnosis Date  . Anxiety   . Cavus foot, acquired 09/09/2012    bilateral cavus feet the one on the left shows that she has some probable neurogenic weakness with abnormal curvature and in inversion contracture Podiatry 06/2015: H&P and x-ray reviewed with patient. Today I went ahead and I did a proximal nerve block I was able to aspirate the second MPJ and got out amount of clear fluid and injected with a quarter cc dexamethasone Kenalog and ap  . DDD (degenerative disc disease), lumbar   . Depression   . Endometrial polyp   . GERD (gastroesophageal reflux disease)   . Hiatal hernia   . Hip flexor tendinitis, right 07/02/2016  . History of adenomatous polyp of colon   . History of esophageal stricture   . History of gastric polyp 11/2017  . History of primary hyperparathyroidism    s/p  left parathryoidectomy 09-17-2008-- resolved  . Hyperlipidemia   . Hypertension   . Insomnia   . Lumbar stenosis   . Osteopenia   . Schatzki's ring   . Scoliosis   . Tendinopathy of right biceps tendon 07/02/2016  . Thickened endometrium   . Uterine fibroid   . Wears glasses    Allergies  Allergen Reactions  . Latex Itching and Rash  . Polysporin [Bacitracin-Polymyxin B] Rash   Past Surgical History:  Procedure Laterality Date  . BREAST REDUCTION SURGERY Bilateral 2000  . COLONOSCOPY    . CYST EXCISION  1985   head and rt axilla  . DILATATION & CURETTAGE/HYSTEROSCOPY WITH MYOSURE N/A 08/13/2018   Procedure: DILATATION & CURETTAGE/HYSTEROSCOPY WITH MYOSURE;  Surgeon: Princess Bruins, MD;  Location:  [facility];  Service: Gynecology;  Laterality: N/A;  requests 10:15am in [place] Gyn block request 30 minutes  . DILATION AND CURETTAGE OF UTERUS N/A 08/13/2018  . ECTOPIC PREGNANCY SURGERY  1981  . PARATHYROIDECTOMY Left 09/17/2008   dr Ronnald Collum  @[facility]    left superior  (adenoma)  . SHOULDER SURGERY Right 2007   "frozen"  . UPPER GASTROINTESTINAL ENDOSCOPY  last one 12-18-2017   dr Rush [place]   Family History  Problem Relation Age of Onset  . Heart disease Father 16  . Breast cancer Mother   . Hypertension Brother   . Hypertension Sister   . Breast cancer Maternal Aunt   . Colon cancer Neg Hx   . Esophageal cancer Neg Hx   . Stomach cancer Neg Hx   . Rectal cancer Neg Hx   . Colon polyps Neg Hx    Social History   Social History Narrative   Lives with husband. Feels safe at home. 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 [employer]. Some college. Former smoker. Wears seat belt.  Allergies as of 01/06/2020      Reactions   Latex Itching, Rash   Polysporin [bacitracin-polymyxin B] Rash      Medication List       Accurate as of January 06, 2020 12:06 PM. If you have any questions, ask your nurse or doctor.        STOP taking these medications   amLODipine 2.5 MG tablet Commonly known as: NORVASC Stopped by: Howard Pouch, DO   mupirocin ointment 2 % Commonly known as: Bactroban Stopped by: Howard Pouch, DO   nitrofurantoin (macrocrystal-monohydrate) 100 MG capsule Commonly known as: Macrobid Stopped by: Howard Pouch, DO   Vitamin D (Ergocalciferol) 1.25 MG (50000 UNIT) Caps capsule Commonly known as: DRISDOL Stopped by: Howard Pouch, DO     TAKE these medications   ALPRAZolam 0.25 MG tablet Commonly known as: XANAX Take 0.25-0.5 mg by mouth daily.   atorvastatin 20 MG tablet Commonly known as: LIPITOR Take 1 tablet (20 mg total) by mouth daily.   FLUoxetine 40 MG capsule Commonly known as: PROZAC Take 40 mg by mouth daily.    losartan 100 MG tablet Commonly known as: COZAAR Take 1 tablet (100 mg total) by mouth daily.   omeprazole 40 MG capsule Commonly known as: PRILOSEC Take 1 capsule (40 mg total) by mouth daily. PLEASE SCHEDULE FOLLOW UP APPOINTMENT   valACYclovir 500 MG tablet Commonly known as: VALTREX Take 500 mg by mouth as needed.   zolpidem 5 MG tablet Commonly known as: AMBIEN Take 0.5-1 tablets (2.5-5 mg total) by mouth at bedtime.       All past medical history, surgical history, allergies, family history, immunizations andmedications were updated in the EMR today and reviewed under the history and medication portions of their EMR.     No results found for this or any previous visit (from the past 2160 hour(s)).   ROS: 14 pt review of systems performed and negative (unless mentioned in an HPI)  Objective: BP 121/78   Pulse 70   Temp 98.3 F (36.8 C) (Oral)   Ht 5' 2.25" (1.581 m)   Wt 161 lb (73 kg)   SpO2 94%   BMI 29.21 kg/m  Gen: Afebrile. No acute distress. Nontoxic in appearance, well-developed, well-nourished,  Pleasant female HENT: AT. [place]. Bilateral TM visualized and normal in appearance, normal external auditory canal. MMM, no oral lesions, adequate dentition. Bilateral nares within normal limits. Throat without erythema, ulcerations or exudates. no Cough on exam, no hoarseness on exam. Eyes:Pupils Equal Round Reactive to light, Extraocular movements intact,  Conjunctiva without redness, discharge or icterus. Neck/lymp/endocrine: Supple,no lymphadenopathy, no thyromegaly CV: RRR no murmur, no edema, +2/4 P posterior tibialis pulses.  Chest: CTAB, no wheeze, rhonchi or crackles. normal Respiratory effort. good Air movement. Abd: Soft. flat. Mild TTP bilateral abd, ND. BS present. no Masses palpated. No hepatosplenomegaly. No rebound tenderness or guarding. Skin: no rashes, purpura or petechiae. Warm and well-perfused. Skin intact. Neuro/Msk:  Normal gait. PERLA. EOMi.  Alert. Oriented x3.  Cranial nerves II through XII intact. Muscle strength 5/5 upper/lower extremity. DTRs equal bilaterally. Psych: Normal affect, dress and demeanor. Normal speech. Normal thought content and judgment.   No exam data present  Assessment/plan: Rebecca Long is a 69 y.o. female present for CPE/CMC  Essential hypertension, benign/Hyperlipidemia, unspecified hyperlipidemia type/obesity -stable.  - continue  losartan 100 mg daily - DC amlodipine 2.5 mg daily- has been controlled without.  - cbc, cmp, tsh, lipid - Continue exercising at least 150  minutes a week. Low-sodium diet. - F/U 5.5 months as long as doing well.  Insomnia:  Agreed to take over patient's Ambien prescription.  Discussed safety profile with her and the likelihood of possibly needing to try another medication to help her sleep in the future. Continue  Ambien 2.5-5 seems to be working well with her without negative side effects. [place] reviewed. Follow-up face-to-face every 5.5 months needed for refills.  Depression with anxiety Managed by J. Hughs.  Gastroesophageal reflux disease with esophagitis without hemorrhage Continue to follow-up with Dr. Fuller Plan Continue Prilosec 40 mg daily Avoid dietary triggers  Osteopenia, unspecified location Bone density UTD - Vitamin D (25 hydroxy Diabetes mellitus screening - Hemoglobin A1c Encounter for screening mammogram for malignant neoplasm of breast - MM 3D SCREEN BREAST BILATERAL; Future  Lower abdominal pain Patient reports she had a recent UTI treated by a virtual encounter with Macrobid 100 mg twice daily.  New onset lower ABD pain last week, in which she was not seen for.  She did have constipation at the time.  She has  known history of diverticulosis per last colonoscopy-patient has never had a flare.  She denies fevers at this time.  Her symptoms are mildly improved. - C-reactive protein - Urinalysis w microscopic + reflex cultur -We  will advise patient on management after results received  Encounter for general adult medical examination with abnormal findings Patient was encouraged to exercise greater than 150 minutes a week. Patient was encouraged to choose a diet filled with fresh fruits and vegetables, and lean meats. AVS provided to patient today for education/recommendation on gender specific health and safety maintenance. Colonoscopy: 08/21/2018, 3 year follow up. Dr. Fuller Plan Mammogram (50-74): 05/2019; at [facility] in [place] Cervical cancer screening(<65):N/A- following with GYN for urinary incontinence, fibroid and poss.uterine hyperplasia Immunizations: tdap UTD 2016, PNA series completed, shingrix completed, covid series completed with booster, flu shot UTD 2021 Infectious disease screening: completed 2017 DEXA: 09/2018, 3 yr rpt- osteopenia -1.8  Return in about 5 months (around 06/20/2020) for [facility] (30 min).    Orders Placed This Encounter  Procedures  . MM 3D SCREEN BREAST BILATERAL  . CBC with Differential/Platelet  . Comprehensive metabolic panel  . Hemoglobin A1c  . Lipid panel  . TSH  . Vitamin D (25 hydroxy)  . C-reactive protein  . Urinalysis w microscopic + reflex cultur   Meds ordered this encounter  Medications  . losartan (COZAAR) 100 MG tablet    Sig: Take 1 tablet (100 mg total) by mouth daily.    Dispense:  90 tablet    Refill:  1  . atorvastatin (LIPITOR) 20 MG tablet    Sig: Take 1 tablet (20 mg total) by mouth daily.    Dispense:  90 tablet    Refill:  3  . omeprazole (PRILOSEC) 40 MG capsule    Sig: Take 1 capsule (40 mg total) by mouth daily. PLEASE SCHEDULE FOLLOW UP APPOINTMENT    Dispense:  90 capsule    Refill:  3  . zolpidem (AMBIEN) 5 MG tablet    Sig: Take 0.5-1 tablets (2.5-5 mg total) by mouth at bedtime.    Dispense:  90 tablet    Refill:  1   Referral Orders  No referral(s) requested today     Electronically signed by: Howard Pouch, [place]

## 2020-01-07 ENCOUNTER — Other Ambulatory Visit: Payer: Self-pay | Admitting: Family Medicine

## 2020-01-07 DIAGNOSIS — R1030 Lower abdominal pain, unspecified: Secondary | ICD-10-CM | POA: Diagnosis not present

## 2020-01-07 MED ORDER — CEPHALEXIN 500 MG PO CAPS: 500.0000 mg | ORAL_CAPSULE | Freq: Three times a day (TID) | ORAL | 0 refills | Status: DC

## 2020-01-07 NOTE — Telephone Encounter (Signed)
Patient advised and voiced understanding.  

## 2020-01-07 NOTE — Telephone Encounter (Signed)
Pt is to dropping off urine today

## 2020-01-07 NOTE — Telephone Encounter (Signed)
Please attempt to call patient ASAP.  I have also sent all of this information in her MyChart for her and had advised her to call in so that we can obtain a urine specimen from her to send for urinalysis and culture.    Liver, kidney and thyroid function are normal Blood cell counts and electrolytes are normal> no signs of a large infection. Diabetes screening/A1c is normal a Cholesterol panel looks great Vitamin D levels are normal  Once she drops off the urine, please send the pended antibiotic to her pharmacy of choice.

## 2020-01-08 LAB — URINALYSIS W MICROSCOPIC + REFLEX CULTURE
Bacteria, UA: NONE SEEN /HPF
Bilirubin Urine: NEGATIVE
Glucose, UA: NEGATIVE
Hgb urine dipstick: NEGATIVE
Hyaline Cast: NONE SEEN /LPF
Ketones, ur: NEGATIVE
Leukocyte Esterase: NEGATIVE
Nitrites, Initial: NEGATIVE
Protein, ur: NEGATIVE
RBC / HPF: NONE SEEN /HPF (ref 0–2)
Specific Gravity, Urine: 1.004 (ref 1.001–1.03)
Squamous Epithelial / HPF: NONE SEEN /HPF (ref <=5)
WBC, UA: NONE SEEN /HPF (ref 0–5)
pH: 6.5 (ref 5.0–8.0)

## 2020-01-08 LAB — NO CULTURE INDICATED

## 2020-01-08 NOTE — Addendum Note (Signed)
Addended by: Howard Pouch A on: 01/08/2020 04:54 PM   Modules accepted: Level of Service

## 2020-01-13 ENCOUNTER — Ambulatory Visit: Payer: Medicare HMO | Admitting: Family Medicine

## 2020-02-24 ENCOUNTER — Ambulatory Visit: Payer: Medicare HMO | Admitting: Physician Assistant

## 2020-03-09 ENCOUNTER — Ambulatory Visit: Payer: Medicare HMO

## 2020-03-22 NOTE — Progress Notes (Signed)
Subjective:   Rebecca Long is a 70 y.o. female who presents for Medicare Annual (Subsequent) preventive examination.  Review of Systems     Cardiac Risk Factors include: advanced age (>41men, >25 women);dyslipidemia;hypertension     Objective:    Today's Vitals   03/23/20 0945  BP: 134/84  Pulse: 72  Resp: 16  Temp: 97.8 F (36.6 C)  TempSrc: Temporal  SpO2: 98%  Weight: 163 lb 1.6 oz (74 kg)  Height: 5' 2.25" (1.581 m)   Body mass index is 29.59 kg/m.  Advanced Directives 03/23/2020 08/13/2018 04/09/2017 10/18/2015 08/14/2013  Does Patient Have a Medical Advance Directive? Yes Yes Yes Yes Patient has advance directive, copy not in chart  Type of Advance Directive [place];Living will [place];Living will Living will;Healthcare Power of Attorney Living will;Healthcare Power of Attorney Living will;Healthcare Power of Attorney  Does patient want to make changes to medical advance directive? - No - Patient declined - No - Patient declined -  Copy of [place] in Chart? - No - copy requested No - copy requested No - copy requested -    Current Medications (verified) Outpatient Encounter Medications as of 03/23/2020  Medication Sig  . ALPRAZolam (XANAX) 0.25 MG tablet Take 0.25-0.5 mg by mouth daily.  Marland Kitchen atorvastatin (LIPITOR) 20 MG tablet Take 1 tablet (20 mg total) by mouth daily.  . cephALEXin (KEFLEX) 500 MG capsule Take 1 capsule (500 mg total) by mouth 3 (three) times daily.  Marland Kitchen FLUoxetine (PROZAC) 40 MG capsule Take 40 mg by mouth daily.  Marland Kitchen losartan (COZAAR) 100 MG tablet Take 1 tablet (100 mg total) by mouth daily.  Marland Kitchen omeprazole (PRILOSEC) 40 MG capsule Take 1 capsule (40 mg total) by mouth daily. PLEASE SCHEDULE FOLLOW UP APPOINTMENT  . valACYclovir (VALTREX) 500 MG tablet Take 500 mg by mouth as needed.   . zolpidem (AMBIEN) 5 MG tablet Take 0.5-1 tablets (2.5-5 mg total) by mouth at bedtime.   No  facility-administered encounter medications on file as of 03/23/2020.    Allergies (verified) Latex and Polysporin [bacitracin-polymyxin b]   History: Past Medical History:  Diagnosis Date  . Anxiety   . Cavus foot, acquired 09/09/2012    bilateral cavus feet the one on the left shows that she has some probable neurogenic weakness with abnormal curvature and in inversion contracture Podiatry 06/2015: H&P and x-ray reviewed with patient. Today I went ahead and I did a proximal nerve block I was able to aspirate the second MPJ and got out amount of clear fluid and injected with a quarter cc dexamethasone Kenalog and ap  . DDD (degenerative disc disease), lumbar   . Depression   . Endometrial polyp   . GERD (gastroesophageal reflux disease)   . Hiatal hernia   . Hip flexor tendinitis, right 07/02/2016  . History of adenomatous polyp of colon   . History of esophageal stricture   . History of gastric polyp 11/2017  . History of primary hyperparathyroidism    s/p  left parathryoidectomy 09-17-2008-- resolved  . Hyperlipidemia   . Hypertension   . Insomnia   . Lumbar stenosis   . Osteopenia   . Schatzki's ring   . Scoliosis   . Tendinopathy of right biceps tendon 07/02/2016  . Thickened endometrium   . Uterine fibroid   . Wears glasses    Past Surgical History:  Procedure Laterality Date  . BREAST REDUCTION SURGERY Bilateral 2000  . COLONOSCOPY    .  CYST EXCISION  1985   head and rt axilla  . DILATATION & CURETTAGE/HYSTEROSCOPY WITH MYOSURE N/A 08/13/2018   Procedure: DILATATION & CURETTAGE/HYSTEROSCOPY WITH MYOSURE;  Surgeon: Princess Bruins, MD;  Location: [facility];  Service: Gynecology;  Laterality: N/A;  requests 10:15am in [facility] block request 30 minutes  . DILATION AND CURETTAGE OF UTERUS N/A 08/13/2018  . ECTOPIC PREGNANCY SURGERY  1981  . PARATHYROIDECTOMY Left 09/17/2008   dr Ronnald Collum  @[facility]    left superior  (adenoma)  . SHOULDER SURGERY Right  2007   "frozen"  . UPPER GASTROINTESTINAL ENDOSCOPY  last one 12-18-2017   dr Rush Landmark   Family History  Problem Relation Age of Onset  . Heart disease Father 48  . Breast cancer Mother   . Hypertension Brother   . Hypertension Sister   . Breast cancer Maternal Aunt   . Colon cancer Neg Hx   . Esophageal cancer Neg Hx   . Stomach cancer Neg Hx   . Rectal cancer Neg Hx   . Colon polyps Neg Hx    Social History   Socioeconomic History  . Marital status: Married    Spouse name: Not on file  . Number of children: 1  . Years of education: Not on file  . Highest education level: Not on file  Occupational History  . Not on file  Tobacco Use  . Smoking status: Former Smoker    Years: 15.00    Types: Cigarettes    Quit date: 01/30/1991    Years since quitting: 29.1  . Smokeless tobacco: Never Used  Vaping Use  . Vaping Use: Never used  Substance and Sexual Activity  . Alcohol use: Not Currently  . Drug use: Never  . Sexual activity: Not Currently    Birth control/protection: Post-menopausal    Comment: 1st intercourse- 48, partners- 13, married- 55 yrs   Other Topics Concern  . Not on file  Social History Narrative   Lives with husband. Feels safe at home. 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 [employer]. Some college. Former smoker. Wears seat belt.    Social Determinants of Health   Financial Resource Strain: Low Risk   . Difficulty of Paying Living Expenses: Not hard at all  Food Insecurity: No Food Insecurity  . Worried About Charity fundraiser in the Last Year: Never true  . Ran Out of Food in the Last Year: Never true  Transportation Needs: No Transportation Needs  . Lack of Transportation (Medical): No  . Lack of Transportation (Non-Medical): No  Physical Activity: Sufficiently Active  . Days of Exercise per Week: 4 days  . Minutes of Exercise per Session: 60 min  Stress: No Stress Concern Present  . Feeling of Stress : Only a little  Social Connections:  Moderately Integrated  . Frequency of Communication with Friends and Family: More than three times a week  . Frequency of Social Gatherings with Friends and Family: More than three times a week  . Attends Religious Services: Never  . Active Member of Clubs or Organizations: Yes  . Attends Archivist Meetings: More than 4 times per year  . Marital Status: Married    Tobacco Counseling Counseling given: Not Answered   Clinical Intake:     Pain : No/denies pain     Nutritional Status: BMI 25 -29 Overweight Nutritional Risks: None Diabetes: No  How often do you need to have someone help you when you read instructions, pamphlets, or other  written materials from your doctor or pharmacy?: 1 - Never  Diabetic?No  Interpreter Needed?: No  Information entered by :: Caroleen Hamman LPN   Activities of Daily Living In your present state of health, do you have any difficulty performing the following activities: 03/23/2020  Hearing? N  Vision? N  Difficulty concentrating or making decisions? Y  Comment occasionally forgets names  Walking or climbing stairs? N  Dressing or bathing? N  Doing errands, shopping? N  Preparing Food and eating ? N  Using the Toilet? N  In the past six months, have you accidently leaked urine? Y  Comment occasionally  Do you have problems with loss of bowel control? N  Managing your Medications? N  Managing your Finances? N  Housekeeping or managing your Housekeeping? N  Some recent data might be hidden    Patient Care Team: Ma Hillock, DO as PCP - General (Family Medicine) Regal, Tamala Fothergill, DPM as Consulting Physician (Podiatry) Sydnee Levans, MD as Consulting Physician (Dermatology) Stefanie Libel, MD as Consulting Physician (Sports Medicine) Ladene Artist, MD as Consulting Physician (Gastroenterology) Rutherford Guys, MD as Consulting Physician (Ophthalmology) Princess Bruins, MD as Consulting Physician (Obstetrics and  Gynecology) Eino Farber, PA-C as Physician Assistant (Psychiatry)  Indicate any recent Medical Services you may have received from other than [facility] providers in the past year (date may be approximate).     Assessment:   This is a routine wellness examination for Elinora.  Hearing/Vision screen  Hearing Screening   125Hz  250Hz  500Hz  1000Hz  2000Hz  3000Hz  4000Hz  6000Hz  8000Hz   Right ear:           Left ear:           Comments: No issues  Vision Screening Comments: Wears glasses Last eye exam-2021-[facility]  Dietary issues and exercise activities discussed: Current Exercise Habits: Home exercise routine, Type of exercise: Other - see comments (pickle ball), Time (Minutes): 60, Frequency (Times/Week): 4, Weekly Exercise (Minutes/Week): 240, Intensity: Mild, Exercise limited by: None identified  Goals    . Patient Stated     Maintain current health    . Patient Stated     Would like to lose some weight      Depression Screen PHQ 2/9 Scores 03/23/2020 01/06/2020 07/23/2019 06/17/2018 11/04/2017 05/15/2017 04/09/2017  PHQ - 2 Score 1 0 0 6 0 2 2  PHQ- 9 Score - - 7 12 - 9 10    Fall Risk Fall Risk  03/23/2020 01/06/2020 06/17/2018 11/04/2017 04/09/2017  Falls in the past year? 1 0 0 No No  Number falls in past yr: 0 0 - - -  Injury with Fall? 1 0 - - -  Risk for fall due to : History of fall(s) - - - -  Risk for fall due to: Comment - - - - -  Follow up Falls prevention discussed Falls evaluation completed Education provided;Falls evaluation completed;Falls prevention discussed - -    FALL RISK PREVENTION PERTAINING TO THE HOME:  Any stairs in or around the home? Yes  If so, are there any without handrails? No  Home free of loose throw rugs in walkways, pet beds, electrical cords, etc? Yes  Adequate lighting in your home to reduce risk of falls? Yes   ASSISTIVE DEVICES UTILIZED TO PREVENT FALLS:  Life alert? No  Use of a cane, walker or w/c? No  Grab bars in the bathroom?  Yes  Shower chair or bench in shower? No  Elevated toilet seat  or a handicapped toilet? No   TIMED UP AND GO:  Was the test performed? Yes .  Length of time to ambulate 10 feet: 9 sec.   Gait steady and fast without use of assistive device  Cognitive Function:Normal cognitive status assessed by direct observation by this Nurse Health Advisor. No abnormalities found.   MMSE - Mini Mental State Exam 04/09/2017 10/18/2015  Not completed: - (No Data)  Orientation to time 5 -  Orientation to Place 5 -  Registration 3 -  Attention/ Calculation 5 -  Recall 3 -  Language- name 2 objects 2 -  Language- repeat 1 -  Language- follow 3 step command 3 -  Language- read & follow direction 1 -  Write a sentence 1 -  Copy design 1 -  Total score 30 -        Immunizations Immunization History  Administered Date(s) Administered  . Hepatitis A, Adult 11/30/2016  . Influenza, High Dose Seasonal PF 10/18/2015, 11/12/2016, 10/18/2017, 09/15/2018  . Influenza,inj,Quad PF,6+ Mos 10/15/2012, 10/06/2014  . Influenza-Unspecified 10/15/2012, 10/14/2019  . PFIZER(Purple Top)SARS-COV-2 Vaccination 02/19/2019, 03/12/2019, 10/12/2019  . Pneumococcal Conjugate-13 10/29/2014  . Pneumococcal Polysaccharide-23 11/01/2015  . Td 10/24/2004  . Tdap 10/29/2014  . Zoster 01/30/2007  . Zoster Recombinat (Shingrix) 04/11/2017, 07/25/2017    TDAP status: Up to date  Flu Vaccine status: Up to date  Pneumococcal vaccine status: Up to date  Covid-19 vaccine status: Completed vaccines  Qualifies for Shingles Vaccine? No   Zostavax completed Yes   Shingrix Completed?: Yes  Screening Tests Health Maintenance  Topic Date Due  . MAMMOGRAM  06/01/2021  . COLONOSCOPY (Pts 45-19yrs Insurance coverage will need to be confirmed)  08/20/2021  . DEXA SCAN  10/06/2021  . TETANUS/TDAP  10/28/2024  . INFLUENZA VACCINE  Completed  . COVID-19 Vaccine  Completed  . Hepatitis C Screening  Completed  . PNA vac Low  Risk Adult  Completed    Health Maintenance  There are no preventive care reminders to display for this patient.  Colorectal cancer screening: Type of screening: Colonoscopy. Completed 08/21/2018. Repeat every 3 years  Mammogram status: Completed Bilateral-06/02/2019. Repeat every year   Bone Density status: Completed 10/07/2018. Results reflect: Bone density results: OSTEOPENIA. Repeat every 2 years.  Lung Cancer Screening: (Low Dose CT Chest recommended if Age 12-80 years, 30 pack-year currently smoking OR have quit w/in 15years.) does not qualify.     Additional Screening:  Hepatitis C Screening: Completed 11/10/2015  Vision Screening: Recommended annual ophthalmology exams for early detection of glaucoma and other disorders of the eye. Is the patient up to date with their annual eye exam?  Yes  Who is the provider or what is the name of the office in which the patient attends annual eye exams? [facility]   Dental Screening: Recommended annual dental exams for proper oral hygiene  Community Resource Referral / Chronic Care Management: CRR required this visit?  No   CCM required this visit?  No      Plan:     I have personally reviewed and noted the following in the patient's chart:   . Medical and social history . Use of alcohol, tobacco or illicit drugs  . Current medications and supplements . Functional ability and status . Nutritional status . Physical activity . Advanced directives . List of other physicians . Hospitalizations, surgeries, and ER visits in previous 12 months . Vitals . Screenings to include cognitive, depression, and falls . Referrals and appointments  In addition, I have reviewed and discussed with patient certain preventive protocols, quality metrics, and best practice recommendations. A written personalized care plan for preventive services as well as general preventive health recommendations were provided to patient.   Patient to access  avs on mychart  Marta Antu, [place]   09/30/7239  Nurse Health Advisor  Nurse Notes: None

## 2020-03-23 ENCOUNTER — Other Ambulatory Visit: Payer: Self-pay

## 2020-03-23 ENCOUNTER — Ambulatory Visit (INDEPENDENT_AMBULATORY_CARE_PROVIDER_SITE_OTHER): Payer: Medicare HMO

## 2020-03-23 VITALS — BP 134/84 | HR 72 | Temp 97.8°F | Resp 16 | Ht 62.25 in | Wt 163.1 lb

## 2020-03-23 DIAGNOSIS — Z0000 Encounter for general adult medical examination without abnormal findings: Secondary | ICD-10-CM

## 2020-03-23 NOTE — Patient Instructions (Signed)
[name] , Thank you for taking time to come for your Medicare Wellness Visit. I appreciate your ongoing commitment to your health goals. Please review the following plan we discussed and let me know if I can assist you in the future.   Screening recommendations/referrals: Colonoscopy: Completed 08/21/2018-Due 08/20/2021 Mammogram: Completed 06/02/2019-Due 06/01/2020 Bone Density: Completed 10/07/2018-Due 10/06/2020 Recommended yearly ophthalmology/optometry visit for glaucoma screening and checkup Recommended yearly dental visit for hygiene and checkup  Vaccinations: Influenza vaccine: Up to date Pneumococcal vaccine: Completed vaccines Tdap vaccine: Up to date- Due-10/28/2024 Shingles vaccine: Completed vaccines Covid-19:Completed vaccines  Advanced directives: Copy in chart  Conditions/risks identified: See problem list  Next appointment: Follow up in one year for your annual wellness visit    Preventive Care 60 Years and Older, Female Preventive care refers to lifestyle choices and visits with your health care provider that can promote health and wellness. What does preventive care include?  A yearly physical exam. This is also called an annual well check.  Dental exams once or twice a year.  Routine eye exams. Ask your health care provider how often you should have your eyes checked.  Personal lifestyle choices, including:  Daily care of your teeth and gums.  Regular physical activity.  Eating a healthy diet.  Avoiding tobacco and drug use.  Limiting alcohol use.  Practicing safe sex.  Taking low-dose aspirin every day.  Taking vitamin and mineral supplements as recommended by your health care provider. What happens during an annual well check? The services and screenings done by your health care provider during your annual well check will depend on your age, overall health, lifestyle risk factors, and family history of disease. Counseling  Your health care provider  may ask you questions about your:  Alcohol use.  Tobacco use.  Drug use.  Emotional well-being.  Home and relationship well-being.  Sexual activity.  Eating habits.  History of falls.  Memory and ability to understand (cognition).  Work and work Statistician.  Reproductive health. Screening  You may have the following tests or measurements:  Height, weight, and BMI.  Blood pressure.  Lipid and cholesterol levels. These may be checked every 5 years, or more frequently if you are over 64 years old.  Skin check.  Lung cancer screening. You may have this screening every year starting at age 1 if you have a 30-pack-year history of smoking and currently smoke or have quit within the past 15 years.  Fecal occult blood test (FOBT) of the stool. You may have this test every year starting at age 66.  Flexible sigmoidoscopy or colonoscopy. You may have a sigmoidoscopy every 5 years or a colonoscopy every 10 years starting at age 59.  Hepatitis C blood test.  Hepatitis B blood test.  Sexually transmitted disease (STD) testing.  Diabetes screening. This is done by checking your blood sugar (glucose) after you have not eaten for a while (fasting). You may have this done every 1-3 years.  Bone density scan. This is done to screen for osteoporosis. You may have this done starting at age 11.  Mammogram. This may be done every 1-2 years. Talk to your health care provider about how often you should have regular mammograms. Talk with your health care provider about your test results, treatment options, and if necessary, the need for more tests. Vaccines  Your health care provider may recommend certain vaccines, such as:  Influenza vaccine. This is recommended every year.  Tetanus, diphtheria, and acellular pertussis (Tdap, Td)  vaccine. You may need a Td booster every 10 years.  Zoster vaccine. You may need this after age 77.  Pneumococcal 13-valent conjugate (PCV13) vaccine.  One dose is recommended after age 54.  Pneumococcal polysaccharide (PPSV23) vaccine. One dose is recommended after age 79. Talk to your health care provider about which screenings and vaccines you need and how often you need them. This information is not intended to replace advice given to you by your health care provider. Make sure you discuss any questions you have with your health care provider. Document Released: 02/11/2015 Document Revised: 10/05/2015 Document Reviewed: 11/16/2014 Elsevier Interactive Patient Education  2017 [place] Prevention in the Home Falls can cause injuries. They can happen to people of all ages. There are many things you can do to make your home safe and to help prevent falls. What can I do on the outside of my home?  Regularly fix the edges of walkways and driveways and fix any cracks.  Remove anything that might make you trip as you walk through a door, such as a raised step or threshold.  Trim any bushes or trees on the path to your home.  Use bright outdoor lighting.  Clear any walking paths of anything that might make someone trip, such as rocks or tools.  Regularly check to see if handrails are loose or broken. Make sure that both sides of any steps have handrails.  Any raised decks and porches should have guardrails on the edges.  Have any leaves, snow, or ice cleared regularly.  Use sand or salt on walking paths during winter.  Clean up any spills in your garage right away. This includes oil or grease spills. What can I do in the bathroom?  Use night lights.  Install grab bars by the toilet and in the tub and shower. Do not use towel bars as grab bars.  Use non-skid mats or decals in the tub or shower.  If you need to sit down in the shower, use a plastic, non-slip stool.  Keep the floor dry. Clean up any water that spills on the floor as soon as it happens.  Remove soap buildup in the tub or shower regularly.  Attach bath  mats securely with double-sided non-slip rug tape.  Do not have throw rugs and other things on the floor that can make you trip. What can I do in the bedroom?  Use night lights.  Make sure that you have a light by your bed that is easy to reach.  Do not use any sheets or blankets that are too big for your bed. They should not hang down onto the floor.  Have a firm chair that has side arms. You can use this for support while you get dressed.  Do not have throw rugs and other things on the floor that can make you trip. What can I do in the kitchen?  Clean up any spills right away.  Avoid walking on wet floors.  Keep items that you use a lot in easy-to-reach places.  If you need to reach something above you, use a strong step stool that has a grab bar.  Keep electrical cords out of the way.  Do not use floor polish or wax that makes floors slippery. If you must use wax, use non-skid floor wax.  Do not have throw rugs and other things on the floor that can make you trip. What can I do with my stairs?  Do not leave  any items on the stairs.  Make sure that there are handrails on both sides of the stairs and use them. Fix handrails that are broken or loose. Make sure that handrails are as long as the stairways.  Check any carpeting to make sure that it is firmly attached to the stairs. Fix any carpet that is loose or worn.  Avoid having throw rugs at the top or bottom of the stairs. If you do have throw rugs, attach them to the floor with carpet tape.  Make sure that you have a light switch at the top of the stairs and the bottom of the stairs. If you do not have them, ask someone to add them for you. What else can I do to help prevent falls?  Wear shoes that:  Do not have high heels.  Have rubber bottoms.  Are comfortable and fit you well.  Are closed at the toe. Do not wear sandals.  If you use a stepladder:  Make sure that it is fully opened. Do not climb a closed  stepladder.  Make sure that both sides of the stepladder are locked into place.  Ask someone to hold it for you, if possible.  Clearly mark and make sure that you can see:  Any grab bars or handrails.  First and last steps.  Where the edge of each step is.  Use tools that help you move around (mobility aids) if they are needed. These include:  Canes.  Walkers.  Scooters.  Crutches.  Turn on the lights when you go into a dark area. Replace any light bulbs as soon as they burn out.  Set up your furniture so you have a clear path. Avoid moving your furniture around.  If any of your floors are uneven, fix them.  If there are any pets around you, be aware of where they are.  Review your medicines with your doctor. Some medicines can make you feel dizzy. This can increase your chance of falling. Ask your doctor what other things that you can do to help prevent falls. This information is not intended to replace advice given to you by your health care provider. Make sure you discuss any questions you have with your health care provider. Document Released: 11/11/2008 Document Revised: 06/23/2015 Document Reviewed: 02/19/2014 Elsevier Interactive Patient Education  2017 [employer].

## 2020-03-25 ENCOUNTER — Ambulatory Visit: Payer: Medicare HMO | Admitting: Physician Assistant

## 2020-03-25 ENCOUNTER — Encounter: Payer: Self-pay | Admitting: Physician Assistant

## 2020-03-25 VITALS — BP 120/68 | HR 73 | Ht 63.0 in | Wt 164.0 lb

## 2020-03-25 DIAGNOSIS — Z8719 Personal history of other diseases of the digestive system: Secondary | ICD-10-CM | POA: Diagnosis not present

## 2020-03-25 DIAGNOSIS — R1030 Lower abdominal pain, unspecified: Secondary | ICD-10-CM

## 2020-03-25 NOTE — Patient Instructions (Signed)
If you are age 71 or older, your body mass index should be between 23-30. Your Body mass index is 29.05 kg/m. If this is out of the aforementioned range listed, please consider follow up with your Primary Care Provider.  If you are age 71 or younger, your body mass index should be between 19-25. Your Body mass index is 29.05 kg/m. If this is out of the aformentioned range listed, please consider follow up with your Primary Care Provider.   Follow up as needed   Thank you for choosing me and [facility].  Ellouise Newer, PA-C

## 2020-03-25 NOTE — Progress Notes (Signed)
Chief Complaint: Lower abdominal pain  HPI:    Rebecca Long is a 70 year old female with a past medical history as listed below, known to Dr. Fuller Plan, who was referred to me by Howard Pouch A, DO for a complaint of lower abdominal pain.      08/28/2013 colonoscopy due to patient's history of adenomatous polyps with mild diverticulosis in the sigmoid colon otherwise normal.  Repeat recommended in 5 years.    12/05/2017 seen in clinic for dysphagia.  At that time schedule patient for a barium esophagram with tablet    12/09/2017 esophagram with smooth distal esophageal stricture temporary delay in the passage of a barium tablet which eventually passed in the stomach.  Small hiatal hernia.    12/18/2017 EGD with dilation and repeat recommended in 3 months.  Also patient started on pantoprazole 40 twice daily.    08/21/2018 colonoscopy with a 10 mm polyp in the ascending colon, 16 mm polyp in the ascending colon, two 5 mm polyps in transverse colon, 1 7 mm polyp in sigmoid colon mild diverticulosis.  Pathology showed adenomatous polyps and repeat was recommended in 3 years.    08/21/2018 EGD with LA grade C reflux esophagitis, benign-appearing esophageal stenosis dilated, gastritis, medium-sized hiatal hernia multiple gastric polyps with erythematous duodenopathy.    Today, the patient explains that she has had 2 episodes of a lower abdominal cramping pain, the first time this happened was mid December, at that time she had an ultrasound of her pelvis and underwent evaluation by OB/GYN which was negative. Tells me the pain at that time was more severe but only lasted for about 24 h. The second time this happened was a week or so ago and started with cramping in her lower abdomen really is a 4/10 and made it was sore to the touch. She radiated from diarrhea to constipation over 48 h and then the pain went away completely and she has not felt it again for another week. Patient did not try any ibuprofen or other  over-the-counter medications during this time.    Tells me her stomach feels okay, she is currently taking her omeprazole 40 mg once daily. Does tell me that she has a constant sore throat. Also has a history of osteoporosis.    Denies fever, chills, weight loss or blood in her stool.      Past Medical History:  Diagnosis Date  . Anxiety   . Cavus foot, acquired 09/09/2012    bilateral cavus feet the one on the left shows that she has some probable neurogenic weakness with abnormal curvature and in inversion contracture Podiatry 06/2015: H&P and x-ray reviewed with patient. Today I went ahead and I did a proximal nerve block I was able to aspirate the second MPJ and got out amount of clear fluid and injected with a quarter cc dexamethasone Kenalog and ap  . DDD (degenerative disc disease), lumbar   . Depression   . Endometrial polyp   . GERD (gastroesophageal reflux disease)   . Hiatal hernia   . Hip flexor tendinitis, right 07/02/2016  . History of adenomatous polyp of colon   . History of esophageal stricture   . History of gastric polyp 11/2017  . History of primary hyperparathyroidism    s/p  left parathryoidectomy 09-17-2008-- resolved  . Hyperlipidemia   . Hypertension   . Insomnia   . Lumbar stenosis   . Osteopenia   . Schatzki's ring   . Scoliosis   . Tendinopathy  of right biceps tendon 07/02/2016  . Thickened endometrium   . Uterine fibroid   . Wears glasses     Past Surgical History:  Procedure Laterality Date  . BREAST REDUCTION SURGERY Bilateral 2000  . COLONOSCOPY    . CYST EXCISION  1985   head and rt axilla  . DILATATION & CURETTAGE/HYSTEROSCOPY WITH MYOSURE N/A 08/13/2018   Procedure: DILATATION & CURETTAGE/HYSTEROSCOPY WITH MYOSURE;  Surgeon: Princess Bruins, MD;  Location: [place];  Service: Gynecology;  Laterality: N/A;  requests 10:15am in [facility] block request 30 minutes  . DILATION AND CURETTAGE OF UTERUS N/A 08/13/2018  .  ECTOPIC PREGNANCY SURGERY  1981  . PARATHYROIDECTOMY Left 09/17/2008   dr Ronnald Collum  @[facility]    left superior  (adenoma)  . SHOULDER SURGERY Right 2007   "frozen"  . UPPER GASTROINTESTINAL ENDOSCOPY  last one 12-18-2017   dr Rush Landmark    Current Outpatient Medications  Medication Sig Dispense Refill  . ALPRAZolam (XANAX) 0.25 MG tablet Take 0.25-0.5 mg by mouth daily.    Marland Kitchen atorvastatin (LIPITOR) 20 MG tablet Take 1 tablet (20 mg total) by mouth daily. 90 tablet 3  . FLUoxetine (PROZAC) 40 MG capsule Take 40 mg by mouth daily.    Marland Kitchen losartan (COZAAR) 100 MG tablet Take 1 tablet (100 mg total) by mouth daily. 90 tablet 1  . omeprazole (PRILOSEC) 40 MG capsule Take 1 capsule (40 mg total) by mouth daily. PLEASE SCHEDULE FOLLOW UP APPOINTMENT 90 capsule 3  . valACYclovir (VALTREX) 500 MG tablet Take 500 mg by mouth as needed.     . zolpidem (AMBIEN) 5 MG tablet Take 0.5-1 tablets (2.5-5 mg total) by mouth at bedtime. 90 tablet 1   No current facility-administered medications for this visit.    Allergies as of 03/25/2020 - Review Complete 03/25/2020  Allergen Reaction Noted  . Latex Itching and Rash 08/14/2013  . Polysporin [bacitracin-polymyxin b] Rash 09/09/2012    Family History  Problem Relation Age of Onset  . Heart disease Father 53  . Breast cancer Mother   . Hypertension Brother   . Hypertension Sister   . Breast cancer Maternal Aunt   . Colon cancer Neg Hx   . Esophageal cancer Neg Hx   . Stomach cancer Neg Hx   . Rectal cancer Neg Hx   . Colon polyps Neg Hx     Social History   Socioeconomic History  . Marital status: Married    Spouse name: Not on file  . Number of children: 1  . Years of education: Not on file  . Highest education level: Not on file  Occupational History  . Not on file  Tobacco Use  . Smoking status: Former Smoker    Years: 15.00    Types: Cigarettes    Quit date: 01/30/1991    Years since quitting: 29.1  . Smokeless tobacco: Never Used   Vaping Use  . Vaping Use: Never used  Substance and Sexual Activity  . Alcohol use: Not Currently  . Drug use: Never  . Sexual activity: Not Currently    Birth control/protection: Post-menopausal    Comment: 1st intercourse- 62, partners- 58, married- 32 yrs   Other Topics Concern  . Not on file  Social History Narrative   Lives with husband. Feels safe at home. 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 [employer]. Some college. Former smoker. Wears seat belt.    Social Determinants of Health   Financial Resource Strain: Low Risk   .  Difficulty of Paying Living Expenses: Not hard at all  Food Insecurity: No Food Insecurity  . Worried About Charity fundraiser in the Last Year: Never true  . Ran Out of Food in the Last Year: Never true  Transportation Needs: No Transportation Needs  . Lack of Transportation (Medical): No  . Lack of Transportation (Non-Medical): No  Physical Activity: Sufficiently Active  . Days of Exercise per Week: 4 days  . Minutes of Exercise per Session: 60 min  Stress: No Stress Concern Present  . Feeling of Stress : Only a little  Social Connections: Moderately Integrated  . Frequency of Communication with Friends and Family: More than three times a week  . Frequency of Social Gatherings with Friends and Family: More than three times a week  . Attends Religious Services: Never  . Active Member of Clubs or Organizations: Yes  . Attends Archivist Meetings: More than 4 times per year  . Marital Status: Married  Human resources officer Violence: Not At Risk  . Fear of Current or Ex-Partner: No  . Emotionally Abused: No  . Physically Abused: No  . Sexually Abused: No    Review of Systems:    Constitutional: No weight loss, fever or chills Cardiovascular: No chest pain Respiratory: No SOB  Gastrointestinal: See HPI and otherwise negative   Physical Exam:  Vital signs: BP 120/68   Pulse 73   Ht 5\' 3"  (1.6 m)   Wt 164 lb (74.4 kg)   BMI 29.05 kg/m    Constitutional:   Pleasant Caucasian female appears to be in NAD, Well developed, Well nourished, alert and cooperative Respiratory: Respirations even and unlabored. Lungs clear to auscultation bilaterally.   No wheezes, crackles, or rhonchi.  Cardiovascular: Normal S1, S2. No MRG. Regular rate and rhythm. No peripheral edema, cyanosis or pallor.  Gastrointestinal:  Soft, nondistended, nontender. No rebound or guarding. Normal bowel sounds. No appreciable masses or hepatomegaly. Rectal:  Not performed.  Psychiatric:  Demonstrates good judgement and reason without abnormal affect or behaviors.  RELEVANT LABS AND IMAGING: CBC    Component Value Date/Time   WBC 5.2 01/06/2020 0916   RBC 4.53 01/06/2020 0916   HGB 13.1 01/06/2020 0916   HCT 39.6 01/06/2020 0916   PLT 320.0 01/06/2020 0916   MCV 87.3 01/06/2020 0916   MCH 28.4 08/11/2018 1042   MCHC 33.1 01/06/2020 0916   RDW 14.1 01/06/2020 0916   LYMPHSABS 1.8 01/06/2020 0916   MONOABS 0.5 01/06/2020 0916   EOSABS 0.1 01/06/2020 0916   BASOSABS 0.2 (H) 01/06/2020 0916    CMP     Component Value Date/Time   NA 140 01/06/2020 0916   NA 139 04/29/2014 0000   K 4.2 01/06/2020 0916   CL 106 01/06/2020 0916   CO2 28 01/06/2020 0916   GLUCOSE 95 01/06/2020 0916   BUN 12 01/06/2020 0916   BUN 15 04/29/2014 0000   CREATININE 0.73 01/06/2020 0916   CREATININE 0.72 11/10/2015 0831   CALCIUM 9.2 01/06/2020 0916   PROT 6.2 01/06/2020 0916   ALBUMIN 3.8 01/06/2020 0916   AST 21 01/06/2020 0916   ALT 28 01/06/2020 0916   ALKPHOS 58 01/06/2020 0916   BILITOT 0.7 01/06/2020 0916   GFRNONAA >60 08/11/2018 1042   GFRNONAA 88 11/10/2015 0831   GFRAA >60 08/11/2018 1042   GFRAA >89 11/10/2015 0831    Assessment: 1. Lower abdominal pain: Has occurred twice, the last time 3 months ago and then again a week  or 2 ago, last for 24 to 48 h, abdomen is sore to the touch, then resolves on its own; consider IBS versus constipation versus  colitis 2. History of esophageal stricture: Currently no symptoms of dysphagia  Plan: 1. Discussed the patient that if this happens again she can call and we can send in some Hyoscyamine sulfate 0.125 mg sublingual tabs to possibly help with the pain. She can take these every 4-6 h. She does not wish to have any of these on hand right now. 2. Recommend she continue her Omeprazole 40 mg daily 3. Also discussed she should proceed to the ER for CT in the acute setting or we could order stat CT AP with contrast. 4. Patient will follow in clinic with [place] as needed in the future.  Ellouise Newer, PA-C [facility] 03/25/2020, 11:05 AM  Cc: Howard Pouch A, DO

## 2020-03-28 NOTE — Progress Notes (Signed)
Reviewed and agree with management plan.  Hulda Reddix T. Mahamud Metts, MD FACG [phone]  

## 2020-04-04 ENCOUNTER — Other Ambulatory Visit: Payer: Self-pay | Admitting: Family Medicine

## 2020-04-04 DIAGNOSIS — I10 Essential (primary) hypertension: Secondary | ICD-10-CM

## 2020-04-04 NOTE — Telephone Encounter (Signed)
Please advise of alternate medication

## 2020-05-19 ENCOUNTER — Telehealth: Payer: Self-pay

## 2020-05-19 NOTE — Telephone Encounter (Signed)
PA sent via covermymed on 05/19/20   Key: [id number]   Medication: Zolpidem 5mg    Dx: Insomnia, G47.00   Per Dr. Raoul Pitch, pt has tried and failed n/a   Waiting for response.

## 2020-05-20 NOTE — Telephone Encounter (Signed)
This approval authorizes your coverage from 01/30/2020 - 01/28/2021. Pt was notified.

## 2020-06-02 DIAGNOSIS — Z1231 Encounter for screening mammogram for malignant neoplasm of breast: Secondary | ICD-10-CM | POA: Diagnosis not present

## 2020-06-02 LAB — HM MAMMOGRAPHY

## 2020-06-04 DIAGNOSIS — Z20822 Contact with and (suspected) exposure to covid-19: Secondary | ICD-10-CM | POA: Diagnosis not present

## 2020-06-06 ENCOUNTER — Telehealth: Payer: Self-pay

## 2020-06-06 NOTE — Telephone Encounter (Signed)
Left pt VM to return call to office. -JMA

## 2020-06-06 NOTE — Telephone Encounter (Signed)
Received Mammogram results from [facility] on 06/03/20. HM updated and results abstracted. Placed on PCP desk for review.

## 2020-06-06 NOTE — Telephone Encounter (Signed)
Please inform patient her mammogram is normal.  

## 2020-06-07 NOTE — Telephone Encounter (Signed)
Spoke with pt regarding labs and instructions.   

## 2020-06-28 ENCOUNTER — Ambulatory Visit: Payer: Medicare HMO | Admitting: Family Medicine

## 2020-06-28 ENCOUNTER — Encounter: Payer: Self-pay | Admitting: Family Medicine

## 2020-06-28 ENCOUNTER — Other Ambulatory Visit: Payer: Self-pay

## 2020-06-28 ENCOUNTER — Ambulatory Visit: Payer: Self-pay

## 2020-06-28 VITALS — BP 130/72 | HR 67 | Ht 62.0 in | Wt 164.2 lb

## 2020-06-28 DIAGNOSIS — G8929 Other chronic pain: Secondary | ICD-10-CM

## 2020-06-28 DIAGNOSIS — M25551 Pain in right hip: Secondary | ICD-10-CM

## 2020-06-28 DIAGNOSIS — M545 Low back pain, unspecified: Secondary | ICD-10-CM

## 2020-06-28 DIAGNOSIS — S39012A Strain of muscle, fascia and tendon of lower back, initial encounter: Secondary | ICD-10-CM | POA: Diagnosis not present

## 2020-06-28 MED ORDER — TIZANIDINE HCL 2 MG PO TABS: 2.0000 mg | ORAL_TABLET | Freq: Three times a day (TID) | ORAL | 1 refills | Status: DC | PRN

## 2020-06-28 NOTE — Progress Notes (Signed)
   I, Rebecca Long, LAT, ATC, am serving as scribe for Dr. [name].  BRITHANY WHITWORTH is a 70 y.o. female who presents to [place] at [facility] today for back and hip pain.  She was last seen by Dr. Tamala Julian on 12/10/19 for L shoulder pain.  Since then, pt reports low back and R hip pain x approximately one week that she may have injured while playing pickle ball.  She states that her R hip started bothering her about 2-3 weeks ago and the back started after that.  Radiating pain: No LE numbness/tingling: No Aggravating factors: Walking; transitioning from supine to stand and sit to stand; taking deep breath/coughing Treatments tried: CBD patches; Tylenol; heat  Diagnostic testing: Pelvis MRI- 09/09/17;  Pertinent review of systems: no fever or chills  Relevant historical information: scoliosis   Exam:  BP 130/72 (BP Location: Right Arm, Patient Position: Sitting, Cuff Size: Normal)   Pulse 67   Ht 5\' 2"  (1.575 m)   Wt 164 lb 3.2 oz (74.5 kg)   SpO2 97%   BMI 30.03 kg/m  General: Well Developed, well nourished, and in no acute distress.   MSK:  Spine: Thoracolumbar scoliosis. L-spine nontender midline. Mildly tender palpation right lumbar paraspinal musculature. Decreased lumbar motion to extension rotation and lateral flexion Lower extremity strength is intact except noted below. Negative slump test.  Right hip normal-appearing Normal motion. Mild pain anterior hip with flexion and internal rotation. Mild pain with cross over and figure 4 stretch Strength: Abduction 4/5 External rotation 4+/5 both with pain.   Leg length equal    Assessment and Plan: 70 y.o. female with  Right low back pain chronic with acute exacerbation. Chronic pain thought to be due to lumbar DDD and facet DJD due to scoliosis. However the acute pain is due to myofacial spasm and dysfunction of the musculature L/S area specifically the QL.   Right lateral hip pain due to hip  abductor tendinopathy and trochanteric bursitis.  Plan to treat both issues with physical therapy.  Limited tizanidine.  Also recommend heating pad and TENS unit.  Recheck 6 weeks.  If not better will proceed with imaging and further evaluation.   PDMP not reviewed this encounter. No orders of the defined types were placed in this encounter.  Meds ordered this encounter  Medications  . tiZANidine (ZANAFLEX) 2 MG tablet    Sig: Take 1 tablet (2 mg total) by mouth every 8 (eight) hours as needed for muscle spasms.    Dispense:  60 tablet    Refill:  1     Discussed warning signs or symptoms. Please see discharge instructions. Patient expresses understanding.   The above documentation has been reviewed and is accurate and complete [name], M.D.

## 2020-06-28 NOTE — Patient Instructions (Addendum)
Thank you for coming in today.  Let me know which location you would like to go to for PT.  [facility], [facility], or [facility].   Use the tizanidine muscle relaxer sparingly.   Heating pad and TENS unit.   Ok to play if you feel ok.   Recheck in about 6 weeks.     TENS UNIT: This is helpful for muscle pain and spasm.   Search and Purchase a TENS 7000 2nd edition at  www.tenspros.com or www.[place].com It should be less than $30.     TENS unit instructions: Do not shower or bathe with the unit on . Turn the unit off before removing electrodes or batteries . If the electrodes lose stickiness add a drop of water to the electrodes after they are disconnected from the unit and place on plastic sheet. If you continued to have difficulty, call the TENS unit company to purchase more electrodes. . Do not apply lotion on the skin area prior to use. Make sure the skin is clean and dry as this will help prolong the life of the electrodes. . After use, always check skin for unusual red areas, rash or other skin difficulties. If there are any skin problems, does not apply electrodes to the same area. . Never remove the electrodes from the unit by pulling the wires. . Do not use the TENS unit or electrodes other than as directed. . Do not change electrode placement without consultating your therapist or physician. Marland Kitchen Keep 2 fingers with between each electrode. . Wear time ratio is 2:1, on to off times.    For example on for 30 minutes off for 15 minutes and then on for 30 minutes off for 15 minutes

## 2020-08-15 ENCOUNTER — Ambulatory Visit: Payer: Medicare HMO | Admitting: Family Medicine

## 2020-08-30 ENCOUNTER — Other Ambulatory Visit: Payer: Self-pay

## 2020-08-30 ENCOUNTER — Telehealth: Payer: Self-pay

## 2020-08-30 ENCOUNTER — Encounter: Payer: Self-pay | Admitting: Family Medicine

## 2020-08-30 ENCOUNTER — Ambulatory Visit (INDEPENDENT_AMBULATORY_CARE_PROVIDER_SITE_OTHER): Payer: Medicare HMO | Admitting: Family Medicine

## 2020-08-30 VITALS — BP 123/76 | HR 78 | Temp 98.4°F

## 2020-08-30 DIAGNOSIS — R42 Dizziness and giddiness: Secondary | ICD-10-CM | POA: Diagnosis not present

## 2020-08-30 DIAGNOSIS — H8111 Benign paroxysmal vertigo, right ear: Secondary | ICD-10-CM

## 2020-08-30 NOTE — Telephone Encounter (Signed)
[facility] - Client TELEPHONE ADVICE RECORD [facility] Patient Name: Rebecca Long [facility] Gender: Female DOB: 1950/05/05 Age: 70 Y 10 M 29 D Return Phone Number: [phone] (Primary) Address: City/ State/ Zip: [place]  [address] Client [facility] - Client Client Site [place] - Day Physician Raoul Pitch, [place] Contact Type Call Who Is Calling Patient / Member / Family / Caregiver Call Type Triage / Clinical Relationship To Patient Self Return Phone Number [phone] (Primary) Chief Complaint Dizziness Reason for Call Symptomatic / Request for [name] states she had dizziness for a couple days that went away. Today she woke up with the dizziness again and wants to know why? Additional Comment Office doesnt have any avaliable appointments so they are wanting her triaged. [place] Not Listed "[facility]" per pt. Translation No Nurse Assessment Nurse: Mariea Clonts, RN, Brandi Date/Time (Eastern Time): 08/29/2020 4:03:44 PM Confirm and document reason for call. If symptomatic, describe symptoms. ---Caller states she had dizziness for a couple days that went away for two or three weeks and just came back today. Today she woke up with the dizziness again. If eyes closed does not feel dizzy. Does the patient have any new or worsening symptoms? ---Yes Will a triage be completed? ---Yes Related visit to physician within the last 2 weeks? ---No Does the PT have any chronic conditions? (i.e. diabetes, asthma, this includes High risk factors for pregnancy, etc.) ---Yes List chronic conditions. ---joint pain Is this a behavioral health or substance abuse call? ---No Guidelines Guideline Title Affirmed Question Affirmed Notes Nurse Date/Time (Eastern Time) Dizziness - Lightheadedness Loss of vision or double vision (Exception: similar to previous migraines) Mariea Clonts, Therapist, sports, Velna Hatchet  08/29/2020 4:04:43 PM PLEASE NOTE: All timestamps contained within this report are represented as [place] Standard Time. CONFIDENTIALTY NOTICE: This fax transmission is intended only for the addressee. It contains information that is legally privileged, confidential or otherwise protected from use or disclosure. If you are not the intended recipient, you are strictly prohibited from reviewing, disclosing, copying using or disseminating any of this information or taking any action in reliance on or regarding this information. If you have received this fax in error, please notify us immediately by telephone so that we can arrange for its return to [place]. Phone: [phone], Toll-Free: [phone], Fax: [phone] Page: 2 of 2 Call Id: [id number] [place]. Time Eilene Ghazi Time) Disposition Final User 08/29/2020 4:08:20 PM Go to ED Now (or PCP triage) Yes Mariea Clonts, RN, Berdie Ogren Disagree/Comply Comply Caller Understands Yes PreDisposition Did not know what to do Care Advice Given Per Guideline GO TO ED NOW (OR PCP TRIAGE): * IF NO PCP (PRIMARY CARE PROVIDER) SECOND-LEVEL TRIAGE: You need to be seen within the next hour. Go to the [facility] at _____________ [place] as soon as you can. BRING MEDICINES: ANOTHER ADULT SHOULD DRIVE: Referrals GO TO FACILITY OTHER - SPECIFY

## 2020-08-30 NOTE — Telephone Encounter (Signed)
Pt sched for 245

## 2020-08-30 NOTE — Progress Notes (Signed)
This visit occurred during the SARS-CoV-2 public health emergency.  Safety protocols were in place, including screening questions prior to the visit, additional usage of staff PPE, and extensive cleaning of exam room while observing appropriate contact time as indicated for disinfecting solutions.    Rebecca Long , March 06, 1950, 70 y.o., female MRN: [id number] Patient Care Team    Relationship Specialty Notifications Start End  Ma Hillock, DO PCP - General Family Medicine  10/06/14   Wallene Huh, [place] Consulting Physician Podiatry  10/18/15   Sydnee Levans, MD Consulting Physician Dermatology  10/18/15   Stefanie Libel, MD Consulting Physician Sports Medicine  11/01/15   Ladene Artist, MD Consulting Physician Gastroenterology  11/04/17   Rutherford Guys, MD Consulting Physician Ophthalmology  06/17/18   Princess Bruins, MD Consulting Physician Obstetrics and Gynecology  06/17/18   Eino Farber, PA-C Physician Assistant Psychiatry  06/17/18    Comment: [facility]    Chief Complaint  Patient presents with   Dizziness    Pt c/o dizziness, blurry vision  feels like rooming is spinning reoccurred yesterday started 2 weeks ago; Pt is currently laying down with eye close;      Subjective: Pt presents for an OV with complaints of dizziness with visual changes.  She states she feels like the room is spinning.  This is occurred intermittently for the last 2 weeks.  She states she thinks she is hydrating well.  But if she is still playing pickle ball outside in the heat.  This had occurred to her last year in July.  She denies any palpitations, syncope, chest pain or dyspnea.  Depression screen [facility] 2/9 03/23/2020 01/06/2020 07/23/2019 06/17/2018 11/04/2017  Decreased Interest 0 0 0 3 0  Down, Depressed, Hopeless 1 0 0 3 0  PHQ - 2 Score 1 0 0 6 0  Altered sleeping - - 0 1 -  Tired, decreased energy - - 3 3 -  Change in appetite - - 0 0 -  Feeling bad or failure about  yourself  - - 1 1 -  Trouble concentrating - - 3 1 -  Moving slowly or fidgety/restless - - 0 0 -  Suicidal thoughts - - 0 0 -  PHQ-9 Score - - 7 12 -  Difficult doing work/chores - - Not difficult at all Somewhat difficult -    Allergies  Allergen Reactions   Latex Itching and Rash   Polysporin [Bacitracin-Polymyxin B] Rash   Social History   Social History Narrative   Lives with husband. Feels safe at home. 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 [employer]. Some college. Former smoker. Wears seat belt.    Past Medical History:  Diagnosis Date   Anxiety    Cavus foot, acquired 09/09/2012    bilateral cavus feet the one on the left shows that she has some probable neurogenic weakness with abnormal curvature and in inversion contracture Podiatry 06/2015: H&P and x-ray reviewed with patient. Today I went ahead and I did a proximal nerve block I was able to aspirate the second MPJ and got out amount of clear fluid and injected with a quarter cc dexamethasone Kenalog and ap   DDD (degenerative disc disease), lumbar    Depression    Endometrial polyp    GERD (gastroesophageal reflux disease)    Hiatal hernia    Hip flexor tendinitis, right 07/02/2016   History of adenomatous polyp of colon    History of esophageal stricture  History of gastric polyp 11/2017   History of primary hyperparathyroidism    s/p  left parathryoidectomy 09-17-2008-- resolved   Hyperlipidemia    Hypertension    Insomnia    Lumbar stenosis    Osteopenia    Schatzki's ring    Scoliosis    Tendinopathy of right biceps tendon 07/02/2016   Thickened endometrium    Uterine fibroid    Wears glasses    Past Surgical History:  Procedure Laterality Date   BREAST REDUCTION SURGERY Bilateral 2000   COLONOSCOPY     CYST EXCISION  1985   head and rt axilla   DILATATION & CURETTAGE/HYSTEROSCOPY WITH MYOSURE N/A 08/13/2018   Procedure: DILATATION & CURETTAGE/HYSTEROSCOPY WITH MYOSURE;  Surgeon: Princess Bruins, MD;   Location: [facility];  Service: Gynecology;  Laterality: N/A;  requests 10:15am in [place] Gyn block request 30 minutes   DILATION AND CURETTAGE OF UTERUS N/A 08/13/2018   ECTOPIC PREGNANCY SURGERY  1981   PARATHYROIDECTOMY Left 09/17/2008   dr Ronnald Collum  '@[facility]'$    left superior  (adenoma)   SHOULDER SURGERY Right 2007   "frozen"   UPPER GASTROINTESTINAL ENDOSCOPY  last one 12-18-2017   dr Rush Landmark   Family History  Problem Relation Age of Onset   Heart disease Father 5   Breast cancer Mother    Hypertension Brother    Hypertension Sister    Breast cancer Maternal Aunt    Colon cancer Neg Hx    Esophageal cancer Neg Hx    Stomach cancer Neg Hx    Rectal cancer Neg Hx    Colon polyps Neg Hx    Allergies as of 08/30/2020       Reactions   Latex Itching, Rash   Polysporin [bacitracin-polymyxin B] Rash        Medication List        Accurate as of August 30, 2020 11:59 PM. If you have any questions, ask your nurse or doctor.          STOP taking these medications    ALPRAZolam 0.25 MG tablet Commonly known as: XANAX Stopped by: Howard Pouch, DO   tiZANidine 2 MG tablet Commonly known as: ZANAFLEX Stopped by: Howard Pouch, DO       TAKE these medications    atorvastatin 20 MG tablet Commonly known as: LIPITOR Take 1 tablet (20 mg total) by mouth daily.   FLUoxetine 40 MG capsule Commonly known as: PROZAC Take 40 mg by mouth daily.   omeprazole 40 MG capsule Commonly known as: PRILOSEC Take 1 capsule (40 mg total) by mouth daily. PLEASE SCHEDULE FOLLOW UP APPOINTMENT   telmisartan 80 MG tablet Commonly known as: MICARDIS Take 80 mg by mouth daily. What changed: Another medication with the same name was removed. Continue taking this medication, and follow the directions you see here. Changed by: Howard Pouch, DO   valACYclovir 500 MG tablet Commonly known as: VALTREX Take 500 mg by mouth as needed.   zolpidem 5 MG  tablet Commonly known as: AMBIEN Take 0.5-1 tablets (2.5-5 mg total) by mouth at bedtime.        All past medical history, surgical history, allergies, family history, immunizations andmedications were updated in the EMR today and reviewed under the history and medication portions of their EMR.     ROS: Negative, with the exception of above mentioned in HPI   Objective:  BP 123/76 (Patient Position: Standing)   Pulse 78   Temp 98.4 F (36.9  C) (Oral)   SpO2 96%  There is no height or weight on file to calculate BMI. Gen: Afebrile. No acute distress. Nontoxic in appearance, well developed, well nourished.  HENT: AT. [place]. Bilateral TM visualized without erythema or effusions. MMM, no oral lesions.  Eyes:Pupils Equal Round Reactive to light, Extraocular movements intact,  Conjunctiva without redness, discharge or icterus. Neck/lymp/endocrine: Supple, no lymphadenopathy CV: RRR  Chest: CTAB Neuro: Normal gait. PERLA. EOMi. Alert. Oriented x3  Dix-Hallpike: Positive for horizontal nystagmus right and reproduction of symptoms Psych: Normal affect, dress and demeanor. Normal speech. Normal thought content and judgment.  No results found. No results found. No results found for this or any previous visit (from the past 24 hour(s)).  Assessment/Plan: MAO HAMON is a 70 y.o. female present for OV for  Vertigo/Benign paroxysmal positional vertigo of right ear Discussed BPPV with patient and her husband today. Encouraged her to start Flonase nasal spray daily. Discussed vestibular rehab, she would like to wait on this for now and try Epley maneuver at home. AVS instructions on BPPV and Epley maneuver provided. Follow-up as needed  Reviewed expectations re: course of current medical issues. Discussed self-management of symptoms. Outlined signs and symptoms indicating need for more acute intervention. Patient verbalized understanding and all questions were answered. Patient received  an After-Visit Summary.    No orders of the defined types were placed in this encounter.  No orders of the defined types were placed in this encounter.  Referral Orders  No referral(s) requested today     Note is dictated utilizing voice recognition software. Although note has been proof read prior to signing, occasional typographical errors still can be missed. If any questions arise, please do not hesitate to call for verification.   electronically signed by:  Howard Pouch, DO  [place]

## 2020-08-30 NOTE — Patient Instructions (Signed)
Benign Positional Vertigo Vertigo is the feeling that you or your surroundings are moving when they are not. Benign positional vertigo is the most common form of vertigo. This is usually a harmless condition (benign). This condition is positional. This means that symptoms are triggered bycertain movements and positions. This condition can be dangerous if it occurs while you are doing something that could cause harm to yourself or others. This includes activities such asdriving or operating machinery. What are the causes? The inner ear has fluid-filled canals that help your brain sense movement and balance. When the fluid moves, the brain receives messages about your body'sposition. With benign positional vertigo, calcium crystals in the inner ear break free and disturb the inner ear area. This causes your brain to receive confusingmessages about your body's position. What increases the risk? You are more likely to develop this condition if: You are a woman. You are 31 years of age or older. You have recently had a head injury. You have an inner ear disease. What are the signs or symptoms? Symptoms of this condition usually happen when you move your head or your eyes in different directions. Symptoms may start suddenly and usually last for less than a minute. They include: Loss of balance and falling. Feeling like you are spinning or moving. Feeling like your surroundings are spinning or moving. Nausea and vomiting. Blurred vision. Dizziness. Involuntary eye movement (nystagmus). Symptoms can be mild and cause only minor problems, or they can be severe and interfere with daily life. Episodes of benign positional vertigo may return (recur) over time. Symptoms may also improve over time. How is this diagnosed? This condition may be diagnosed based on: Your medical history. A physical exam of the head, neck, and ears. Positional tests to check for or stimulate vertigo. You may be asked to turn  your head and change positions, such as going from sitting to lying down. A health care provider will watch for symptoms of vertigo. You may be referred to a health care provider who specializes in ear, nose, and throat problems (ENT or otolaryngologist) or a provider who specializes in disorders of the nervous system (neurologist). How is this treated?  This condition may be treated in a session in which your health care provider moves your head in specific positions to help the displaced crystals in your inner ear move. Treatment for this condition may take several sessions. Surgerymay be needed in severe cases, but this is rare. In some cases, benign positional vertigo may resolve on its own in 2-4 weeks. Follow these instructions at home: Safety Move slowly. Avoid sudden body or head movements or certain positions, as told by your health care provider. Avoid driving or operating machinery until your health care provider says it is safe. Avoid doing any tasks that would be dangerous to you or others if vertigo occurs. If you have trouble walking or keeping your balance, try using a cane for stability. If you feel dizzy or unstable, sit down right away. Return to your normal activities as told by your health care provider. Ask your health care provider what activities are safe for you. General instructions Take over-the-counter and prescription medicines only as told by your health care provider. Drink enough fluid to keep your urine pale yellow. Keep all follow-up visits. This is important. Contact a health care provider if: You have a fever. Your condition gets worse or you develop new symptoms. Your family or friends notice any behavioral changes. You have nausea or vomiting  that gets worse. You have numbness or a prickling and tingling sensation. Get help right away if you: Have difficulty speaking or moving. Are always dizzy or faint. Develop severe headaches. Have weakness in your  legs or arms. Have changes in your hearing or vision. Develop a stiff neck. Develop sensitivity to light. These symptoms may represent a serious problem that is an emergency. Do not wait to see if the symptoms will go away. Get medical help right away. Call your local emergency services (911 in the [place] Do not drive yourself to the hospital. Summary Vertigo is the feeling that you or your surroundings are moving when they are not. Benign positional vertigo is the most common form of vertigo. This condition is caused by calcium crystals in the inner ear that become displaced. This causes a disturbance in an area of the inner ear that helps your brain sense movement and balance. Symptoms include loss of balance and falling, feeling that you or your surroundings are moving, nausea and vomiting, and blurred vision. This condition can be diagnosed based on symptoms, a physical exam, and positional tests. Follow safety instructions as told by your health care provider and keep all follow-up visits. This is important. This information is not intended to replace advice given to you by your health care provider. Make sure you discuss any questions you have with your healthcare provider.  How to Perform the Epley Maneuver The Epley maneuver is an exercise that relieves symptoms of vertigo. Vertigo is the feeling that you or your surroundings are moving when they are not. When you feel vertigo, you may feel like the room is spinning and may have trouble walking. The Epley maneuver is used for a type of vertigo caused by a calcium deposit in a part of the inner ear. The maneuver involves changing headpositions to help the deposit move out of the area. You can do this maneuver at home whenever you have symptoms of vertigo. You canrepeat it in 24 hours if your vertigo has not gone away. Even though the Epley maneuver may relieve your vertigo for a few weeks, it is possible that your symptoms will return. This  maneuver relieves vertigo, but itdoes not relieve dizziness. What are the risks? If it is done correctly, the Epley maneuver is considered safe. Sometimes it can lead to dizziness or nausea that goes away after a short time. If you develop other symptoms--such as changes in vision, weakness, or numbness--stopdoing the maneuver and call your health care provider. Supplies needed: A bed or table. A pillow. How to do the Epley maneuver     Sit on the edge of a bed or table with your back straight and your legs extended or hanging over the edge of the bed or table. Turn your head halfway toward the affected ear or side as told by your health care provider. Lie backward quickly with your head turned until you are lying flat on your back. Your head should dangle (head-hanging position). You may want to position a pillow under your shoulders. Hold this position for at least 30 seconds. If you feel dizzy or have symptoms of vertigo, continue to hold the position until the symptoms stop. Turn your head to the opposite direction until your unaffected ear is facing down. Your head should continue to dangle. Hold this position for at least 30 seconds. If you feel dizzy or have symptoms of vertigo, continue to hold the position until the symptoms stop. Turn your whole body to  the same side as your head so that you are positioned on your side. Your head will now be nearly facedown and no longer needs to dangle. Hold for at least 30 seconds. If you feel dizzy or have symptoms of vertigo, continue to hold the position until the symptoms stop. Sit back up. You can repeat the maneuver in 24 hours if your vertigo does not go away. Follow these instructions at home: For 24 hours after doing the Epley maneuver: Keep your head in an upright position. When lying down to sleep or rest, keep your head raised (elevated) with two or more pillows. Avoid excessive neck movements. Activity Do not drive or use machinery  if you feel dizzy. After doing the Epley maneuver, return to your normal activities as told by your health care provider. Ask your health care provider what activities are safe for you. General instructions Drink enough fluid to keep your urine pale yellow. Do not drink alcohol. Take over-the-counter and prescription medicines only as told by your health care provider. Keep all follow-up visits. This is important. Preventing vertigo symptoms Ask your health care provider if there is anything you should do at home to prevent vertigo. He or she may recommend that you: Keep your head elevated with two or more pillows while you sleep. Do not sleep on the side of your affected ear. Get up slowly from bed. Avoid sudden movements during the day. Avoid extreme head positions or movement, such as looking up or bending over. Contact a health care provider if: Your vertigo gets worse. You have other symptoms, including: Nausea. Vomiting. Headache. Get help right away if you: Have vision changes. Have a headache or neck pain that is severe or getting worse. Cannot stop vomiting. Have new numbness or weakness in any part of your body. These symptoms may represent a serious problem that is an emergency. Do not wait to see if the symptoms will go away. Get medical help right away. Call your local emergency services (911 in the [place] Do not drive yourself to the hospital. Summary Vertigo is the feeling that you or your surroundings are moving when they are not. The Epley maneuver is an exercise that relieves symptoms of vertigo. If the Epley maneuver is done correctly, it is considered safe. This information is not intended to replace advice given to you by your health care provider. Make sure you discuss any questions you have with your healthcare provider. Document Revised: 12/16/2019 Document Reviewed: 12/16/2019 Elsevier Patient Education  2022 [place] Revised: 12/16/2019  Document Reviewed: 12/16/2019 Elsevier Patient Education  2022 [employer].

## 2020-09-08 DIAGNOSIS — F419 Anxiety disorder, unspecified: Secondary | ICD-10-CM | POA: Diagnosis not present

## 2020-09-08 DIAGNOSIS — R69 Illness, unspecified: Secondary | ICD-10-CM | POA: Diagnosis not present

## 2020-10-06 ENCOUNTER — Other Ambulatory Visit: Payer: Self-pay | Admitting: Family Medicine

## 2020-10-06 DIAGNOSIS — F419 Anxiety disorder, unspecified: Secondary | ICD-10-CM | POA: Diagnosis not present

## 2020-10-06 DIAGNOSIS — I10 Essential (primary) hypertension: Secondary | ICD-10-CM

## 2020-10-06 DIAGNOSIS — R69 Illness, unspecified: Secondary | ICD-10-CM | POA: Diagnosis not present

## 2020-10-06 DIAGNOSIS — F331 Major depressive disorder, recurrent, moderate: Secondary | ICD-10-CM | POA: Diagnosis not present

## 2020-10-19 DIAGNOSIS — H52203 Unspecified astigmatism, bilateral: Secondary | ICD-10-CM | POA: Diagnosis not present

## 2020-10-19 DIAGNOSIS — H524 Presbyopia: Secondary | ICD-10-CM | POA: Diagnosis not present

## 2020-10-19 DIAGNOSIS — H2513 Age-related nuclear cataract, bilateral: Secondary | ICD-10-CM | POA: Diagnosis not present

## 2020-10-19 DIAGNOSIS — H5203 Hypermetropia, bilateral: Secondary | ICD-10-CM | POA: Diagnosis not present

## 2020-10-24 DIAGNOSIS — M25551 Pain in right hip: Secondary | ICD-10-CM | POA: Diagnosis not present

## 2020-10-24 DIAGNOSIS — R2689 Other abnormalities of gait and mobility: Secondary | ICD-10-CM | POA: Diagnosis not present

## 2020-10-24 DIAGNOSIS — M545 Low back pain, unspecified: Secondary | ICD-10-CM | POA: Diagnosis not present

## 2020-10-27 DIAGNOSIS — M25551 Pain in right hip: Secondary | ICD-10-CM | POA: Diagnosis not present

## 2020-10-27 DIAGNOSIS — M545 Low back pain, unspecified: Secondary | ICD-10-CM | POA: Diagnosis not present

## 2020-10-27 DIAGNOSIS — R2689 Other abnormalities of gait and mobility: Secondary | ICD-10-CM | POA: Diagnosis not present

## 2020-10-28 DIAGNOSIS — Z0100 Encounter for examination of eyes and vision without abnormal findings: Secondary | ICD-10-CM | POA: Diagnosis not present

## 2020-10-30 ENCOUNTER — Other Ambulatory Visit: Payer: Self-pay | Admitting: Family Medicine

## 2020-10-30 DIAGNOSIS — I10 Essential (primary) hypertension: Secondary | ICD-10-CM

## 2020-10-31 DIAGNOSIS — M25551 Pain in right hip: Secondary | ICD-10-CM | POA: Diagnosis not present

## 2020-10-31 DIAGNOSIS — R2689 Other abnormalities of gait and mobility: Secondary | ICD-10-CM | POA: Diagnosis not present

## 2020-10-31 DIAGNOSIS — M545 Low back pain, unspecified: Secondary | ICD-10-CM | POA: Diagnosis not present

## 2020-11-03 ENCOUNTER — Telehealth: Payer: Self-pay

## 2020-11-03 DIAGNOSIS — I10 Essential (primary) hypertension: Secondary | ICD-10-CM

## 2020-11-03 NOTE — Telephone Encounter (Signed)
Patient tried to get refill from pharmacy and stated she needed OV for refills, patient is out of town until last week of October. Patient scheduled appt with Dr. Raoul Pitch on 11/1.  Please call meds in so she will not run out.  [facility]  telmisartan (MICARDIS) 80 MG tablet [[id number]]

## 2020-11-03 NOTE — Progress Notes (Signed)
I, Wendy Poet, LAT, ATC, am serving as scribe for Dr. [name].  Rebecca Long is a 70 y.o. female who presents to [facility] today for f/u of R hip and low back pain.  She was last seen by Dr. Georgina Snell on 06/28/20 and was prescribed Tizanidine and then later referred to PT at [facility].  Today, pt reports that her R hip/low back is slightly improved.  She has completed approximately 6 sessions and has worked w/ a Physiological scientist as well.  She has been doing her HEP.  She is leaving for a trip to [place] on Sunday and will be travelling x 10-11 days.  She locates her worse pain to her R GT/lateral hip w/ radiating pain into her R mid-thigh.  She is occasionally taking Tylenol arthritis.   Pertinent review of systems: No fevers or chills  Relevant historical information: Hypertension   Exam:  BP 124/78 (BP Location: Right Arm, Patient Position: Sitting, Cuff Size: Normal)   Pulse (!) 105   Ht 5\' 2"  (1.575 m)   Wt 170 lb 3.2 oz (77.2 kg)   SpO2 96%   BMI 31.13 kg/m  General: Well Developed, well nourished, and in no acute distress.   MSK: Right hip normal-appearing Normal motion. Tender palpation greater trochanter. Hip abduction strength is diminished 4/5.    Lab and Radiology Results  Procedure: Real-time Ultrasound Guided Injection of right hip greater trochanter bursa Device: Philips Affiniti 50G Images permanently stored and available for review in PACS Verbal informed consent obtained.  Discussed risks and benefits of procedure. Warned about infection bleeding damage to structures skin hypopigmentation and fat atrophy among others. Patient expresses understanding and agreement Time-out conducted.   Noted no overlying erythema, induration, or other signs of local infection.   Skin prepped in a sterile fashion.   Local anesthesia: Topical Ethyl chloride.   With sterile technique and under real time ultrasound guidance: 40 mg of  Kenalog and 2 mL of Marcaine injected into greater trochanter bursa. Fluid seen entering the bursa.   Completed without difficulty   Pain immediately resolved suggesting accurate placement of the medication.   Advised to call if fevers/chills, erythema, induration, drainage, or persistent bleeding.   Images permanently stored and available for review in the ultrasound unit.  Impression: Technically successful ultrasound guided injection.   X-ray images right hip and L-spine obtained today personally and independently interpreted  Right hip: No severe hip OA.  No acute fractures.  L-spine: Degenerative scoliosis convex right.  No acute fractures.  Await formal radiology review     Assessment and Plan: 70 y.o. female with right lateral hip pain predominantly due to hip abductor tendinopathy weakness and hip trochanteric bursitis.  Patient had trial of physical therapy with some benefit but still hurts.  Plan for greater trochanter bursa injection today and continued strengthening exercises.  If not improved following her trip to [place] she will contact me and let me know and I will order hip MRI.   PDMP not reviewed this encounter. Orders Placed This Encounter  Procedures   DG Lumbar Spine 2-3 Views    Standing Status:   Future    Number of Occurrences:   1    Standing Expiration Date:   12/05/2020    Order Specific Question:   Reason for Exam (SYMPTOM  OR DIAGNOSIS REQUIRED)    Answer:   Low back pain    Order Specific Question:   Preferred  imaging location?    Answer:   [facility]   DG HIP UNILAT W OR W/O PELVIS 2-3 VIEWS RIGHT    Standing Status:   Future    Number of Occurrences:   1    Standing Expiration Date:   12/05/2020    Order Specific Question:   Reason for Exam (SYMPTOM  OR DIAGNOSIS REQUIRED)    Answer:   R hip pain    Order Specific Question:   Preferred imaging location?    Answer:   [facility]   [place] LIMITED JOINT SPACE STRUCTURES LOW  RIGHT(NO LINKED CHARGES)    Order Specific Question:   Reason for Exam (SYMPTOM  OR DIAGNOSIS REQUIRED)    Answer:   troch inj    Order Specific Question:   Preferred imaging location?    Answer:   [facility]   No orders of the defined types were placed in this encounter.    Discussed warning signs or symptoms. Please see discharge instructions. Patient expresses understanding.   The above documentation has been reviewed and is accurate and complete [name], M.D.

## 2020-11-04 ENCOUNTER — Other Ambulatory Visit: Payer: Self-pay

## 2020-11-04 ENCOUNTER — Ambulatory Visit: Payer: Self-pay

## 2020-11-04 ENCOUNTER — Ambulatory Visit: Payer: Medicare HMO | Admitting: Family Medicine

## 2020-11-04 ENCOUNTER — Ambulatory Visit (INDEPENDENT_AMBULATORY_CARE_PROVIDER_SITE_OTHER): Payer: Medicare HMO

## 2020-11-04 ENCOUNTER — Encounter: Payer: Self-pay | Admitting: Family Medicine

## 2020-11-04 VITALS — BP 124/78 | HR 105 | Ht 62.0 in | Wt 170.2 lb

## 2020-11-04 DIAGNOSIS — M25551 Pain in right hip: Secondary | ICD-10-CM

## 2020-11-04 DIAGNOSIS — R2689 Other abnormalities of gait and mobility: Secondary | ICD-10-CM | POA: Diagnosis not present

## 2020-11-04 DIAGNOSIS — M545 Low back pain, unspecified: Secondary | ICD-10-CM | POA: Diagnosis not present

## 2020-11-04 DIAGNOSIS — M7061 Trochanteric bursitis, right hip: Secondary | ICD-10-CM

## 2020-11-04 MED ORDER — TELMISARTAN 80 MG PO TABS
80.0000 mg | ORAL_TABLET | Freq: Every day | ORAL | 0 refills | Status: DC
Start: 2020-11-04 — End: 2020-11-29

## 2020-11-04 NOTE — Patient Instructions (Addendum)
Good to see you today.  You had a R greater trochanter injection.  Call or go to the ER if you develop a large red swollen joint with extreme pain or oozing puss.   Please get an Xray today before you leave.  Let me know how you're feeling when you get back from [place] and if not better, then I'll order an MRI.  Follow-up as needed and enjoy your trip!

## 2020-11-04 NOTE — Telephone Encounter (Signed)
Rx sent 

## 2020-11-04 NOTE — Telephone Encounter (Signed)
LVM informing pt rx has been sent

## 2020-11-07 NOTE — Progress Notes (Signed)
X-ray lumbar spine does show some scoliosis with multilevel arthritis changes in the back.

## 2020-11-07 NOTE — Progress Notes (Signed)
X-ray right hip looks normal to radiology.

## 2020-11-26 ENCOUNTER — Other Ambulatory Visit: Payer: Self-pay | Admitting: Family Medicine

## 2020-11-26 DIAGNOSIS — I10 Essential (primary) hypertension: Secondary | ICD-10-CM

## 2020-11-29 ENCOUNTER — Encounter: Payer: Self-pay | Admitting: Family Medicine

## 2020-11-29 ENCOUNTER — Other Ambulatory Visit: Payer: Self-pay

## 2020-11-29 ENCOUNTER — Ambulatory Visit (INDEPENDENT_AMBULATORY_CARE_PROVIDER_SITE_OTHER): Payer: Medicare HMO | Admitting: Family Medicine

## 2020-11-29 VITALS — BP 117/73 | HR 68 | Temp 98.1°F | Ht 62.0 in | Wt 166.0 lb

## 2020-11-29 DIAGNOSIS — F418 Other specified anxiety disorders: Secondary | ICD-10-CM | POA: Diagnosis not present

## 2020-11-29 DIAGNOSIS — G479 Sleep disorder, unspecified: Secondary | ICD-10-CM | POA: Diagnosis not present

## 2020-11-29 DIAGNOSIS — I10 Essential (primary) hypertension: Secondary | ICD-10-CM | POA: Diagnosis not present

## 2020-11-29 DIAGNOSIS — E785 Hyperlipidemia, unspecified: Secondary | ICD-10-CM

## 2020-11-29 DIAGNOSIS — R69 Illness, unspecified: Secondary | ICD-10-CM | POA: Diagnosis not present

## 2020-11-29 DIAGNOSIS — K2100 Gastro-esophageal reflux disease with esophagitis, without bleeding: Secondary | ICD-10-CM

## 2020-11-29 MED ORDER — TELMISARTAN 80 MG PO TABS: 80.0000 mg | ORAL_TABLET | Freq: Every day | ORAL | 1 refills | Status: DC

## 2020-11-29 MED ORDER — ATORVASTATIN CALCIUM 20 MG PO TABS: 20.0000 mg | ORAL_TABLET | Freq: Every day | ORAL | 3 refills | Status: DC

## 2020-11-29 MED ORDER — FLUOXETINE HCL 40 MG PO CAPS: 40.0000 mg | ORAL_CAPSULE | Freq: Every day | ORAL | 1 refills | Status: DC

## 2020-11-29 MED ORDER — ZOLPIDEM TARTRATE 5 MG PO TABS: 2.5000 mg | ORAL_TABLET | Freq: Every day | ORAL | 1 refills | Status: DC

## 2020-11-29 MED ORDER — OMEPRAZOLE 40 MG PO CPDR: 40.0000 mg | DELAYED_RELEASE_CAPSULE | Freq: Every day | ORAL | 3 refills | Status: DC

## 2020-11-29 NOTE — Patient Instructions (Addendum)
  Great to see you today.  I have refilled the medication(s) we provide.   If labs were collected, we will inform you of lab results once received either by echart message or telephone call.   - echart message- for normal results that have been seen by the patient already.   - telephone call: abnormal results or if patient has not viewed results in their echart.  Follow up in 5.5 mos for physical and fasting labs.

## 2020-11-29 NOTE — Progress Notes (Signed)
This visit occurred during the SARS-CoV-2 public health emergency.  Safety protocols were in place, including screening questions prior to the visit, additional usage of staff PPE, and extensive cleaning of exam room while observing appropriate contact time as indicated for disinfecting solutions.    Patient ID: Rebecca Long, female  DOB: [birth year], 70 y.o.   MRN: [id number] Patient Care Team    Relationship Specialty Notifications Start End  Ma Hillock, DO PCP - General Family Medicine  10/06/14   Wallene Huh, [place] Consulting Physician Podiatry  10/18/15   Sydnee Levans, MD Consulting Physician Dermatology  10/18/15   Stefanie Libel, MD Consulting Physician Sports Medicine  11/01/15   Ladene Artist, MD Consulting Physician Gastroenterology  11/04/17   Rutherford Guys, MD Consulting Physician Ophthalmology  06/17/18   Princess Bruins, MD Consulting Physician Obstetrics and Gynecology  06/17/18   Eino Farber, PA-C Physician Assistant Psychiatry  06/17/18    Comment: [facility]    Chief Complaint  Patient presents with   Hypertension    [place]; pt is fasting;   Depression    Pt is no longer taking medication due to price of medication    Subjective: Rebecca Long is a 70 y.o.  Female  present for [facility] Hypertension/hyperlipidemia/overweight:   Pt reports  compliance  with telmisartan 80 mg daily, Lipitor 20 mg daily and daily baby aspirin. She has not needed the amlodipine.Patient denies chest pain, shortness of breath, dizziness or lower extremity edema.  Pt endorses use of daily baby ASA. Pt is  prescribed statin. Exercise: exercise and pickle ball.  RF: HTN, HLD, Fhx Heart disease   Depression/anxiety/Sleep d/o:pt desires management for these conditions by this provider moving forward. She reports her depression/anxiety is stable on prozac 40 mg qd.  Patient reports she has been prescribed Ambien for many many years.  She would like to have this prescription  taken over by this provider- she understands she can not have script filled by any other provider.   She reports she takes Ambien 2.5 mg nightly routinely and occasionally if still unable to fall asleep will take a full Ambien 5 mg.pt reports condition is stable.    GERD: Pt reports condition is stable with prilosec.   Depression screen [facility] 2/9 11/29/2020 03/23/2020 01/06/2020 07/23/2019 06/17/2018  Decreased Interest 1 0 0 0 3  Down, Depressed, Hopeless 1 1 0 0 3  PHQ - 2 Score 2 1 0 0 6  Altered sleeping 1 - - 0 1  Tired, decreased energy 2 - - 3 3  Change in appetite 2 - - 0 0  Feeling bad or failure about yourself  1 - - 1 1  Trouble concentrating 1 - - 3 1  Moving slowly or fidgety/restless 0 - - 0 0  Suicidal thoughts 0 - - 0 0  PHQ-9 Score 9 - - 7 12  Difficult doing work/chores - - - Not difficult at all Somewhat difficult   GAD 7 : Generalized Anxiety Score 11/29/2020 07/23/2019 06/17/2018 05/15/2017  Nervous, Anxious, on Edge 1 1 3 1   Control/stop worrying 1 0 3 1  Worry too much - different things 1 0 3 1  Trouble relaxing 1 0 3 1  Restless 1 0 0 1  Easily annoyed or irritable 1 0 3 1  Afraid - awful might happen 1 0 3 1  Total GAD 7 Score 7 1 18 7   Anxiety Difficulty - Not difficult at all Somewhat  difficult Not difficult at all    Immunization History  Administered Date(s) Administered   Hepatitis A, Adult 11/30/2016   Influenza, High Dose Seasonal PF 10/18/2015, 11/12/2016, 10/18/2017, 09/15/2018, 10/11/2020   Influenza,inj,Quad PF,6+ Mos 10/15/2012, 10/06/2014   Influenza-Unspecified 10/15/2012, 10/14/2019   PFIZER(Purple Top)SARS-COV-2 Vaccination 02/19/2019, 03/12/2019, 10/12/2019, 04/27/2020   Pfizer Covid-19 Vaccine Bivalent Booster 29yrs & up 10/11/2020   Pneumococcal Conjugate-13 10/29/2014   Pneumococcal Polysaccharide-23 11/01/2015   Td 10/24/2004   Tdap 10/29/2014   Zoster Recombinat (Shingrix) 04/11/2017, 07/25/2017   Zoster, Live 01/30/2007     Past  Medical History:  Diagnosis Date   Anxiety    Cavus foot, acquired 09/09/2012    bilateral cavus feet the one on the left shows that she has some probable neurogenic weakness with abnormal curvature and in inversion contracture Podiatry 06/2015: H&P and x-ray reviewed with patient. Today I went ahead and I did a proximal nerve block I was able to aspirate the second MPJ and got out amount of clear fluid and injected with a quarter cc dexamethasone Kenalog and ap   DDD (degenerative disc disease), lumbar    Depression    Endometrial polyp    GERD (gastroesophageal reflux disease)    Hiatal hernia    Hip flexor tendinitis, right 07/02/2016   History of adenomatous polyp of colon    History of esophageal stricture    History of gastric polyp 11/2017   History of primary hyperparathyroidism    s/p  left parathryoidectomy 09-17-2008-- resolved   Hyperlipidemia    Hypertension    Insomnia    Lumbar stenosis    Osteopenia    Schatzki's ring    Scoliosis    Tendinopathy of right biceps tendon 07/02/2016   Thickened endometrium    Uterine fibroid    Wears glasses    Allergies  Allergen Reactions   Latex Itching and Rash   Polysporin [Bacitracin-Polymyxin B] Rash   Past Surgical History:  Procedure Laterality Date   BREAST REDUCTION SURGERY Bilateral 2000   COLONOSCOPY     CYST EXCISION  1985   head and rt axilla   DILATATION & CURETTAGE/HYSTEROSCOPY WITH MYOSURE N/A 08/13/2018   Procedure: DILATATION & CURETTAGE/HYSTEROSCOPY WITH MYOSURE;  Surgeon: Princess Bruins, MD;  Location: [facility];  Service: Gynecology;  Laterality: N/A;  requests 10:15am in [place] Gyn block request 30 minutes   DILATION AND CURETTAGE OF UTERUS N/A 08/13/2018   ECTOPIC PREGNANCY SURGERY  1981   PARATHYROIDECTOMY Left 09/17/2008   dr Ronnald Collum  @[facility]    left superior  (adenoma)   SHOULDER SURGERY Right 2007   "frozen"   UPPER GASTROINTESTINAL ENDOSCOPY  last one 12-18-2017   dr  Rush Landmark   Family History  Problem Relation Age of Onset   Heart disease Father 44   Breast cancer Mother    Hypertension Brother    Hypertension Sister    Breast cancer Maternal Aunt    Colon cancer Neg Hx    Esophageal cancer Neg Hx    Stomach cancer Neg Hx    Rectal cancer Neg Hx    Colon polyps Neg Hx    Social History   Social History Narrative   Lives with husband. Feels safe at home. 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 [employer]. Some college. Former smoker. Wears seat belt.     Allergies as of 11/29/2020       Reactions   Latex Itching, Rash   Polysporin [bacitracin-polymyxin B] Rash  Medication List        Accurate as of November 29, 2020  9:57 AM. If you have any questions, ask your nurse or doctor.          STOP taking these medications    ARIPiprazole 2 MG tablet Commonly known as: ABILIFY Stopped by: Howard Pouch, DO       TAKE these medications    atorvastatin 20 MG tablet Commonly known as: LIPITOR Take 1 tablet (20 mg total) by mouth daily.   FLUoxetine 40 MG capsule Commonly known as: PROZAC Take 1 capsule (40 mg total) by mouth daily.   omeprazole 40 MG capsule Commonly known as: PRILOSEC Take 1 capsule (40 mg total) by mouth daily. What changed: additional instructions Changed by: Howard Pouch, DO   telmisartan 80 MG tablet Commonly known as: MICARDIS Take 1 tablet (80 mg total) by mouth daily.   valACYclovir 500 MG tablet Commonly known as: VALTREX Take 500 mg by mouth as needed.   zolpidem 5 MG tablet Commonly known as: AMBIEN Take 0.5-1 tablets (2.5-5 mg total) by mouth at bedtime.        All past medical history, surgical history, allergies, family history, immunizations andmedications were updated in the EMR today and reviewed under the history and medication portions of their EMR.     No results found for this or any previous visit (from the past 2160 hour(s)).   ROS: 14 pt review of systems  performed and negative (unless mentioned in an HPI)  Objective: BP 117/73   Pulse 68   Temp 98.1 F (36.7 C) (Oral)   Ht 5\' 2"  (1.575 m)   Wt 166 lb (75.3 kg)   SpO2 96%   BMI 30.36 kg/m  Gen: Afebrile. No acute distress. Nontoxic, pleasant female.  HENT: AT. [place]. MMM. No cough.  Eyes:Pupils Equal Round Reactive to light, Extraocular movements intact,  Conjunctiva without redness, discharge or icterus CV: RRR no murmur, no edema, +2/4 P posterior tibialis pulses Chest: CTAB, no wheeze or crackles Neuro:  Normal gait. PERLA. EOMi. Alert. Oriented x3 Psych: Normal affect, dress and demeanor. Normal speech. Normal thought content and judgment. No results found.  Assessment/plan: Rebecca Long is a 70 y.o. female present for [place]  Essential hypertension, benign/Hyperlipidemia, unspecified hyperlipidemia type/obesity -  stable.  - continue  losartan 100 mg daily - DC amlodipine 2.5 mg daily- has been controlled without.  - Continue exercising at least 150 minutes a week. Low-sodium diet. - F/U 5.5 months as long as doing well.   Sleep disturbacne:  Agreed to take over patient's Ambien prescription.  Discussed safety profile with her and the likelihood of possibly needing to try another medication to help her sleep in the future. Continue  Ambien 2.5-5 seems to be working well with her without negative side effects. N[facility]S database reviewed 11/29/20 >  Contract signed 11/29/2020 Follow-up face-to-face every 5.5 months needed for refills.   Depression with anxiety Stable. Agreed to take over management.  Continue prozac 40  mg qd.   Gastroesophageal reflux disease with esophagitis without hemorrhage Continue to follow-up with Dr. Fuller Plan Continue  Prilosec 40 mg daily Avoid dietary triggers     Return in 5 months (on 05/08/2021) for [place].    No orders of the defined types were placed in this encounter.  Meds ordered this encounter  Medications   telmisartan  (MICARDIS) 80 MG tablet    Sig: Take 1 tablet (80 mg total) by mouth daily.  Dispense:  90 tablet    Refill:  1   atorvastatin (LIPITOR) 20 MG tablet    Sig: Take 1 tablet (20 mg total) by mouth daily.    Dispense:  90 tablet    Refill:  3   omeprazole (PRILOSEC) 40 MG capsule    Sig: Take 1 capsule (40 mg total) by mouth daily.    Dispense:  90 capsule    Refill:  3   FLUoxetine (PROZAC) 40 MG capsule    Sig: Take 1 capsule (40 mg total) by mouth daily.    Dispense:  90 capsule    Refill:  1   zolpidem (AMBIEN) 5 MG tablet    Sig: Take 0.5-1 tablets (2.5-5 mg total) by mouth at bedtime.    Dispense:  90 tablet    Refill:  1    Referral Orders  No referral(s) requested today     Electronically signed by: Howard Pouch, [place]

## 2020-12-02 ENCOUNTER — Ambulatory Visit: Payer: Medicare HMO | Admitting: Family Medicine

## 2021-01-04 DIAGNOSIS — L821 Other seborrheic keratosis: Secondary | ICD-10-CM | POA: Diagnosis not present

## 2021-01-04 DIAGNOSIS — L814 Other melanin hyperpigmentation: Secondary | ICD-10-CM | POA: Diagnosis not present

## 2021-01-04 DIAGNOSIS — L400 Psoriasis vulgaris: Secondary | ICD-10-CM | POA: Diagnosis not present

## 2021-01-04 DIAGNOSIS — D2262 Melanocytic nevi of left upper limb, including shoulder: Secondary | ICD-10-CM | POA: Diagnosis not present

## 2021-01-04 DIAGNOSIS — D171 Benign lipomatous neoplasm of skin and subcutaneous tissue of trunk: Secondary | ICD-10-CM | POA: Diagnosis not present

## 2021-01-04 DIAGNOSIS — D1801 Hemangioma of skin and subcutaneous tissue: Secondary | ICD-10-CM | POA: Diagnosis not present

## 2021-01-04 DIAGNOSIS — D225 Melanocytic nevi of trunk: Secondary | ICD-10-CM | POA: Diagnosis not present

## 2021-01-18 DIAGNOSIS — N390 Urinary tract infection, site not specified: Secondary | ICD-10-CM | POA: Diagnosis not present

## 2021-02-07 ENCOUNTER — Other Ambulatory Visit: Payer: Self-pay

## 2021-02-07 ENCOUNTER — Encounter: Payer: Self-pay | Admitting: Family Medicine

## 2021-02-07 ENCOUNTER — Ambulatory Visit (INDEPENDENT_AMBULATORY_CARE_PROVIDER_SITE_OTHER): Payer: Medicare Other | Admitting: Family Medicine

## 2021-02-07 VITALS — BP 106/73 | HR 71 | Temp 97.7°F | Ht 62.0 in | Wt 165.0 lb

## 2021-02-07 DIAGNOSIS — K649 Unspecified hemorrhoids: Secondary | ICD-10-CM

## 2021-02-07 DIAGNOSIS — R310 Gross hematuria: Secondary | ICD-10-CM

## 2021-02-07 LAB — POCT URINALYSIS DIPSTICK
Bilirubin, UA: 1
Blood, UA: 200
Glucose, UA: NEGATIVE
Ketones, UA: 5
Leukocytes, UA: NEGATIVE
Nitrite, UA: NEGATIVE
Protein, UA: POSITIVE — AB
Spec Grav, UA: 1.025 (ref 1.010–1.025)
Urobilinogen, UA: 0.2 E.U./dL
pH, UA: 5.5 (ref 5.0–8.0)

## 2021-02-07 NOTE — Progress Notes (Signed)
This visit occurred during the SARS-CoV-2 public health emergency.  Safety protocols were in place, including screening questions prior to the visit, additional usage of staff PPE, and extensive cleaning of exam room while observing appropriate contact time as indicated for disinfecting solutions.    Rebecca Long , 10-17-1950, 71 y.o., female MRN: [id number] Patient Care Team    Relationship Specialty Notifications Start End  Ma Hillock, DO PCP - General Family Medicine  10/06/14   Wallene Huh, [place] Consulting Physician Podiatry  10/18/15   Sydnee Levans, MD Consulting Physician Dermatology  10/18/15   Stefanie Libel, MD Consulting Physician Sports Medicine  11/01/15   Ladene Artist, MD Consulting Physician Gastroenterology  11/04/17   Rutherford Guys, MD Consulting Physician Ophthalmology  06/17/18   Princess Bruins, MD Consulting Physician Obstetrics and Gynecology  06/17/18   Eino Farber, PA-C Physician Assistant Psychiatry  06/17/18    Comment: [facility]    Chief Complaint  Patient presents with   Hematuria    Pt c/o gross hematuria; pt was seen by UC 12/22 for UTI and was tx  with abx x2 (bactrim and Keflex); pt notice blood in urine today;      Subjective: Pt presents for an OV with complaints of blood in her urine that started today. She was seen in the UC 12/22 and dx w/ uti and treated with bactrim and keflex.  She reports they originally started Keflex and then after culture results switched her to Bactrim for coverage.  She does not have any history of kidney stones.  She is a former smoker.  She feels the urinary tract infection has resolved.  She is currently experiencing bright red blood, and a very small amount on her toilet tissue.  Patient reports she does have hemorrhoids, and has had episodes of diarrhea in the last few days.  Depression screen [facility] 2/9 11/29/2020 03/23/2020 01/06/2020 07/23/2019 06/17/2018  Decreased Interest 1 0 0 0 3  Down,  Depressed, Hopeless 1 1 0 0 3  PHQ - 2 Score 2 1 0 0 6  Altered sleeping 1 - - 0 1  Tired, decreased energy 2 - - 3 3  Change in appetite 2 - - 0 0  Feeling bad or failure about yourself  1 - - 1 1  Trouble concentrating 1 - - 3 1  Moving slowly or fidgety/restless 0 - - 0 0  Suicidal thoughts 0 - - 0 0  PHQ-9 Score 9 - - 7 12  Difficult doing work/chores - - - Not difficult at all Somewhat difficult    Allergies  Allergen Reactions   Latex Itching and Rash   Polysporin [Bacitracin-Polymyxin B] Rash   Social History   Social History Narrative   Lives with husband. Feels safe at home. 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 [employer]. Some college. Former smoker. Wears seat belt.    Past Medical History:  Diagnosis Date   Anxiety    Cavus foot, acquired 09/09/2012    bilateral cavus feet the one on the left shows that she has some probable neurogenic weakness with abnormal curvature and in inversion contracture Podiatry 06/2015: H&P and x-ray reviewed with patient. Today I went ahead and I did a proximal nerve block I was able to aspirate the second MPJ and got out amount of clear fluid and injected with a quarter cc dexamethasone Kenalog and ap   DDD (degenerative disc disease), lumbar    Depression  Endometrial polyp    GERD (gastroesophageal reflux disease)    Hiatal hernia    Hip flexor tendinitis, right 07/02/2016   History of adenomatous polyp of colon    History of esophageal stricture    History of gastric polyp 11/2017   History of primary hyperparathyroidism    s/p  left parathryoidectomy 09-17-2008-- resolved   Hyperlipidemia    Hypertension    Insomnia    Lumbar stenosis    Osteopenia    Schatzki's ring    Scoliosis    Tendinopathy of right biceps tendon 07/02/2016   Thickened endometrium    Uterine fibroid    Wears glasses    Past Surgical History:  Procedure Laterality Date   BREAST REDUCTION SURGERY Bilateral 2000   COLONOSCOPY     CYST EXCISION  1985    head and rt axilla   DILATATION & CURETTAGE/HYSTEROSCOPY WITH MYOSURE N/A 08/13/2018   Procedure: [place];  Surgeon: Princess Bruins, MD;  Location: [facility];  Service: Gynecology;  Laterality: N/A;  requests 10:15am in [place] Gyn block request 30 minutes   DILATION AND CURETTAGE OF UTERUS N/A 08/13/2018   ECTOPIC PREGNANCY SURGERY  1981   PARATHYROIDECTOMY Left 09/17/2008   dr Ronnald Collum  @[facility]    left superior  (adenoma)   SHOULDER SURGERY Right 2007   "frozen"   UPPER GASTROINTESTINAL ENDOSCOPY  last one 12-18-2017   dr Rush Landmark   Family History  Problem Relation Age of Onset   Heart disease Father 13   Breast cancer Mother    Hypertension Brother    Hypertension Sister    Breast cancer Maternal Aunt    Colon cancer Neg Hx    Esophageal cancer Neg Hx    Stomach cancer Neg Hx    Rectal cancer Neg Hx    Colon polyps Neg Hx    Allergies as of 02/07/2021       Reactions   Latex Itching, Rash   Polysporin [bacitracin-polymyxin B] Rash        Medication List        Accurate as of February 07, 2021  3:26 PM. If you have any questions, ask your nurse or doctor.          atorvastatin 20 MG tablet Commonly known as: LIPITOR Take 1 tablet (20 mg total) by mouth daily.   FLUoxetine 40 MG capsule Commonly known as: PROZAC Take 1 capsule (40 mg total) by mouth daily.   omeprazole 40 MG capsule Commonly known as: PRILOSEC Take 1 capsule (40 mg total) by mouth daily.   telmisartan 80 MG tablet Commonly known as: MICARDIS Take 1 tablet (80 mg total) by mouth daily.   valACYclovir 500 MG tablet Commonly known as: VALTREX Take 500 mg by mouth as needed.   zolpidem 5 MG tablet Commonly known as: AMBIEN Take 0.5-1 tablets (2.5-5 mg total) by mouth at bedtime.        All past medical history, surgical history, allergies, family history, immunizations andmedications were updated in the EMR today and  reviewed under the history and medication portions of their EMR.     ROS Negative, with the exception of above mentioned in HPI   Objective:  BP 106/73    Pulse 71    Temp 97.7 F (36.5 C) (Oral)    Ht 5\' 2"  (1.575 m)    Wt 165 lb (74.8 kg)    SpO2 97%    BMI 30.18 kg/m  Body mass  index is 30.18 kg/m. Physical Exam Vitals and nursing note reviewed.  Constitutional:      General: She is not in acute distress.    Appearance: Normal appearance. She is not ill-appearing, toxic-appearing or diaphoretic.  HENT:     Head: Normocephalic and atraumatic.  Eyes:     General: No scleral icterus.       Right eye: No discharge.        Left eye: No discharge.     Extraocular Movements: Extraocular movements intact.     Conjunctiva/sclera: Conjunctivae normal.     Pupils: Pupils are equal, round, and reactive to light.  Cardiovascular:     Rate and Rhythm: Normal rate and regular rhythm.  Abdominal:     General: There is no distension.     Tenderness: There is no abdominal tenderness. There is no right CVA tenderness, left CVA tenderness, guarding or rebound.  Skin:    General: Skin is warm and dry.     Coloration: Skin is not jaundiced or pale.     Findings: No erythema or rash.  Neurological:     Mental Status: She is alert and oriented to person, place, and time. Mental status is at baseline.     Motor: No weakness.     Gait: Gait normal.  Psychiatric:        Mood and Affect: Mood normal.        Behavior: Behavior normal.        Thought Content: Thought content normal.        Judgment: Judgment normal.     No results found. No results found. Results for orders placed or performed in visit on 02/07/21 (from the past 24 hour(s))  POCT Urinalysis Dipstick     Status: Abnormal   Collection Time: 02/07/21  3:16 PM  Result Value Ref Range   Color, UA yellow    Clarity, UA clear    Glucose, UA Negative Negative   Bilirubin, UA 1    Ketones, UA 5    Spec Grav, UA 1.025 1.010 -  1.025   Blood, UA 200    pH, UA 5.5 5.0 - 8.0   Protein, UA Positive (A) Negative   Urobilinogen, UA 0.2 0.2 or 1.0 E.U./dL   Nitrite, UA neg    Leukocytes, UA Negative Negative   Appearance     Odor      Assessment/Plan: Rebecca Long is a 71 y.o. female present for OV for  Gross hematuria/Hemorrhoids, unspecified hemorrhoid type Urinalysis does not appear infectious today by point-of-care testing.  Does have a small amount of blood present.  She does not seem to have any symptoms of a possible kidney stone and has never had a history of this before.  She reports the witnessed blood as bright red on toilet tissue, which leads me to suspect this is either from hemorrhoidal bleeding or possibly a urethral caruncle Send urine for culture.  Would not start antibiotics at this time since he does not appear infectious, treat only if urine culture indicates need.  Patient agrees. -Send patient home with fecal occult cards to be returned this week.  Patient agrees to complete -Patient elected not to have pelvic today. - POCT Urinalysis Dipstick - Urinalysis w microscopic + reflex cultur - Hemoccult Cards (X3 cards); Future If all results are negative, I have encouraged the patient to either make an appointment here or with her gynecology team for pelvic exam.  Patient agrees  Reviewed expectations re:  course of current medical issues. Discussed self-management of symptoms. Outlined signs and symptoms indicating need for more acute intervention. Patient verbalized understanding and all questions were answered. Patient received an After-Visit Summary.    Orders Placed This Encounter  Procedures   POCT Urinalysis Dipstick   No orders of the defined types were placed in this encounter.  Referral Orders  No referral(s) requested today     Note is dictated utilizing voice recognition software. Although note has been proof read prior to signing, occasional typographical errors still can  be missed. If any questions arise, please do not hesitate to call for verification.   electronically signed by:  Howard Pouch, DO  [place]

## 2021-02-07 NOTE — Patient Instructions (Signed)
We will call you with results and plan.

## 2021-02-08 LAB — URINALYSIS W MICROSCOPIC + REFLEX CULTURE
Bacteria, UA: NONE SEEN /HPF
Bilirubin Urine: NEGATIVE
Glucose, UA: NEGATIVE
Hyaline Cast: NONE SEEN /LPF
Ketones, ur: NEGATIVE
Leukocyte Esterase: NEGATIVE
Nitrites, Initial: NEGATIVE
Protein, ur: NEGATIVE
Specific Gravity, Urine: 1.025 (ref 1.001–1.035)
WBC, UA: NONE SEEN /HPF (ref 0–5)
pH: <=5 (ref 5.0–8.0)

## 2021-02-08 LAB — NO CULTURE INDICATED

## 2021-02-10 ENCOUNTER — Other Ambulatory Visit (INDEPENDENT_AMBULATORY_CARE_PROVIDER_SITE_OTHER): Payer: Medicare Other

## 2021-02-10 ENCOUNTER — Telehealth: Payer: Self-pay | Admitting: Family Medicine

## 2021-02-10 ENCOUNTER — Other Ambulatory Visit: Payer: Self-pay

## 2021-02-10 DIAGNOSIS — R310 Gross hematuria: Secondary | ICD-10-CM | POA: Diagnosis not present

## 2021-02-10 DIAGNOSIS — K649 Unspecified hemorrhoids: Secondary | ICD-10-CM

## 2021-02-10 LAB — HEMOCCULT SLIDES (X 3 CARDS)
Fecal Occult Blood: NEGATIVE
OCCULT 1: NEGATIVE
OCCULT 2: NEGATIVE
OCCULT 3: POSITIVE — AB
OCCULT 4: NEGATIVE
OCCULT 5: POSITIVE — AB

## 2021-02-10 MED ORDER — HYDROCORTISONE ACETATE 1 % EX OINT: TOPICAL_OINTMENT | CUTANEOUS | 0 refills | Status: DC

## 2021-02-10 NOTE — Telephone Encounter (Signed)
Please call patient if she has not viewed her message I sent to her in the portal on Friday (copied below). -If she would prefer we refer her to the gastroenterology team we can place a referral for her to hopefully help expedite her being seen, especially she is still experiencing bleeding.    "Your Hemoccult cards were positive for blood.  Therefore the bleeding you were seeing was coming from the hemorrhoids.  I would recommend you speak to your gastroenterologist team.  In the meantime I will call in a cream to apply to the area to help decrease the inflammation.  If bleeding worsens, you should be seen urgently."

## 2021-02-10 NOTE — Progress Notes (Signed)
Per orders of Dr. Raoul Pitch, pt dropped off hemoccult cards and cards were collected by clinic staff. Sw, cma

## 2021-02-13 NOTE — Telephone Encounter (Signed)
MyChart message read.

## 2021-02-22 ENCOUNTER — Ambulatory Visit: Payer: Medicare HMO | Admitting: Family Medicine

## 2021-03-29 ENCOUNTER — Ambulatory Visit (INDEPENDENT_AMBULATORY_CARE_PROVIDER_SITE_OTHER): Payer: Medicare Other

## 2021-03-29 ENCOUNTER — Other Ambulatory Visit: Payer: Self-pay

## 2021-03-29 DIAGNOSIS — Z0000 Encounter for general adult medical examination without abnormal findings: Secondary | ICD-10-CM | POA: Diagnosis not present

## 2021-03-29 NOTE — Progress Notes (Addendum)
Virtual Visit via Telephone Note  I connected with  Rebecca Long on 03/29/21 at 10:15 AM EST by telephone and verified that I am speaking with the correct person using two identifiers.  Medicare Annual Wellness visit completed telephonically due to Covid-19 pandemic.   Persons participating in this call: This Health Coach and this patient.   Location: Patient: Home Provider: Office   I discussed the limitations, risks, security and privacy concerns of performing an evaluation and management service by telephone and the availability of in person appointments. The patient expressed understanding and agreed to proceed.  Unable to perform video visit due to video visit attempted and failed and/or patient does not have video capability.   Some vital signs may be absent or patient reported.   Willette Brace, LPN   Subjective:   Rebecca Long is a 71 y.o. female who presents for Medicare Annual (Subsequent) preventive examination.  Review of Systems     Cardiac Risk Factors include: advanced age (>53men, >43 women);hypertension;dyslipidemia;obesity (BMI >30kg/m2)     Objective:    There were no vitals filed for this visit. There is no height or weight on file to calculate BMI.  Advanced Directives 03/29/2021 03/23/2020 08/13/2018 04/09/2017 10/18/2015 08/14/2013  Does Patient Have a Medical Advance Directive? Yes Yes Yes Yes Yes Patient has advance directive, copy not in chart  Type of Advance Directive Healthcare Power of [place];Living will [place];Living will Living will;Healthcare Power of Attorney Living will;Healthcare Power of Attorney Living will;Healthcare Power of Attorney  Does patient want to make changes to medical advance directive? - - No - Patient declined - No - Patient declined -  Copy of [place] in Chart? No - copy requested - No - copy requested No - copy requested No - copy requested -     Current Medications (verified) Outpatient Encounter Medications as of 03/29/2021  Medication Sig   atorvastatin (LIPITOR) 20 MG tablet Take 1 tablet (20 mg total) by mouth daily.   Cholecalciferol (VITAMIN D3) 10 MCG (400 UNIT) CAPS SMARTSIG:1 Capsule(s) By Mouth   FLUoxetine (PROZAC) 40 MG capsule Take 1 capsule (40 mg total) by mouth daily.   omeprazole (PRILOSEC) 40 MG capsule Take 1 capsule (40 mg total) by mouth daily.   telmisartan (MICARDIS) 80 MG tablet Take 1 tablet (80 mg total) by mouth daily.   valACYclovir (VALTREX) 500 MG tablet Take 500 mg by mouth as needed.    zolpidem (AMBIEN) 5 MG tablet Take 0.5-1 tablets (2.5-5 mg total) by mouth at bedtime.   Hydrocortisone Acetate 1 % OINT Apply twice daily until symptoms are relieved. (Patient not taking: Reported on 03/29/2021)   No facility-administered encounter medications on file as of 03/29/2021.    Allergies (verified) Latex and Polysporin [bacitracin-polymyxin b]   History: Past Medical History:  Diagnosis Date   Anxiety    Cavus foot, acquired 09/09/2012    bilateral cavus feet the one on the left shows that she has some probable neurogenic weakness with abnormal curvature and in inversion contracture Podiatry 06/2015: H&P and x-ray reviewed with patient. Today I went ahead and I did a proximal nerve block I was able to aspirate the second MPJ and got out amount of clear fluid and injected with a quarter cc dexamethasone Kenalog and ap   DDD (degenerative disc disease), lumbar    Depression    Endometrial polyp    GERD (gastroesophageal reflux disease)  Hiatal hernia    Hip flexor tendinitis, right 07/02/2016   History of adenomatous polyp of colon    History of esophageal stricture    History of gastric polyp 11/2017   History of primary hyperparathyroidism    s/p  left parathryoidectomy 09-17-2008-- resolved   Hyperlipidemia    Hypertension    Insomnia    Lumbar stenosis    Osteopenia    Schatzki's ring     Scoliosis    Tendinopathy of right biceps tendon 07/02/2016   Thickened endometrium    Uterine fibroid    Wears glasses    Past Surgical History:  Procedure Laterality Date   BREAST REDUCTION SURGERY Bilateral 2000   COLONOSCOPY     CYST EXCISION  1985   head and rt axilla   DILATATION & CURETTAGE/HYSTEROSCOPY WITH MYOSURE N/A 08/13/2018   Procedure: DILATATION & CURETTAGE/HYSTEROSCOPY WITH MYOSURE;  Surgeon: Princess Bruins, MD;  Location: [facility];  Service: Gynecology;  Laterality: N/A;  requests 10:15am in [place] Gyn block request 30 minutes   DILATION AND CURETTAGE OF UTERUS N/A 08/13/2018   ECTOPIC PREGNANCY SURGERY  1981   PARATHYROIDECTOMY Left 09/17/2008   dr Ronnald Collum  @[facility]    left superior  (adenoma)   SHOULDER SURGERY Right 2007   "frozen"   UPPER GASTROINTESTINAL ENDOSCOPY  last one 12-18-2017   dr Rush Landmark   Family History  Problem Relation Age of Onset   Heart disease Father 44   Breast cancer Mother    Hypertension Brother    Hypertension Sister    Breast cancer Maternal Aunt    Colon cancer Neg Hx    Esophageal cancer Neg Hx    Stomach cancer Neg Hx    Rectal cancer Neg Hx    Colon polyps Neg Hx    Social History   Socioeconomic History   Marital status: Married    Spouse name: Not on file   Number of children: 1   Years of education: Not on file   Highest education level: Not on file  Occupational History   Not on file  Tobacco Use   Smoking status: Former    Years: 15.00    Types: Cigarettes    Quit date: 01/30/1991    Years since quitting: 30.1   Smokeless tobacco: Never  Vaping Use   Vaping Use: Never used  Substance and Sexual Activity   Alcohol use: Not Currently   Drug use: Never   Sexual activity: Not Currently    Birth control/protection: Post-menopausal    Comment: 1st intercourse- 17, partners- 73, married- 6 yrs   Other Topics Concern   Not on file  Social History Narrative   Lives with husband. Feels  safe at home. 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 [employer]. Some college. Former smoker. Wears seat belt.    Social Determinants of Health   Financial Resource Strain: Low Risk    Difficulty of Paying Living Expenses: Not hard at all  Food Insecurity: No Food Insecurity   Worried About Charity fundraiser in the Last Year: Never true   [place] in the Last Year: Never true  Transportation Needs: No Transportation Needs   Lack of Transportation (Medical): No   Lack of Transportation (Non-Medical): No  Physical Activity: Sufficiently Active   Days of Exercise per Week: 3 days   Minutes of Exercise per Session: 60 min  Stress: No Stress Concern Present   Feeling of Stress : Only a little  Social Connections: Moderately Integrated   Frequency of Communication with Friends and Family: More than three times a week   Frequency of Social Gatherings with Friends and Family: More than three times a week   Attends Religious Services: Never   Marine scientist or Organizations: Yes   Attends Music therapist: 1 to 4 times per year   Marital Status: Married    Tobacco Counseling Counseling given: Not Answered   Clinical Intake:  Pre-visit preparation completed: Yes  Pain : No/denies pain     BMI - recorded: 30.18 Nutritional Status: BMI > 30  Obese Nutritional Risks: None Diabetes: No  How often do you need to have someone help you when you read instructions, pamphlets, or other written materials from your doctor or pharmacy?: 1 - Never  Diabetic?NO  Interpreter Needed?: No  Information entered by :: Charlott Rakes, LPN   Activities of Daily Living In your present state of health, do you have any difficulty performing the following activities: 03/29/2021  Hearing? N  Vision? N  Difficulty concentrating or making decisions? N  Walking or climbing stairs? Y  Comment hip pain at times  Dressing or bathing? N  Doing errands, shopping? N  Preparing  Food and eating ? N  Using the Toilet? N  In the past six months, have you accidently leaked urine? Y  Comment wears a pad  Do you have problems with loss of bowel control? Y  Comment wears a pad  Managing your Medications? N  Managing your Finances? N  Housekeeping or managing your Housekeeping? N  Some recent data might be hidden    Patient Care Team: Ma Hillock, DO as PCP - General (Family Medicine) Regal, Tamala Fothergill, DPM as Consulting Physician (Podiatry) Sydnee Levans, MD as Consulting Physician (Dermatology) Stefanie Libel, MD as Consulting Physician (Sports Medicine) Ladene Artist, MD as Consulting Physician (Gastroenterology) Rutherford Guys, MD as Consulting Physician (Ophthalmology) Princess Bruins, MD as Consulting Physician (Obstetrics and Gynecology) Eino Farber, PA-C as Physician Assistant (Psychiatry)  Indicate any recent Medical Services you may have received from other than [facility] providers in the past year (date may be approximate).     Assessment:   This is a routine wellness examination for Rebecca Long.  Hearing/Vision screen Hearing Screening - Comments:: Pt denies any hearing issues  Vision Screening - Comments:: Pt follows up with Dr Gershon Crane for annual eye exams   Dietary issues and exercise activities discussed: Current Exercise Habits: Structured exercise class, Type of exercise: Other - see comments;walking (pickle ball), Time (Minutes): > 60, Frequency (Times/Week): 4, Weekly Exercise (Minutes/Week): 0   Goals Addressed             This Visit's Progress    Patient Stated       Lose about 30 lbs        Depression Screen PHQ 2/9 Scores 03/29/2021 11/29/2020 03/23/2020 01/06/2020 07/23/2019 06/17/2018 11/04/2017  PHQ - 2 Score 1 2 1  0 0 6 0  PHQ- 9 Score 2 9 - - 7 12 -    Fall Risk Fall Risk  03/29/2021 11/29/2020 03/23/2020 01/06/2020 06/17/2018  Falls in the past year? 0 1 1 0 0  Number falls in past yr: 0 1 0 0 -  Injury with Fall? 0 0 1 0 -   Risk for fall due to : Impaired vision History of fall(s) History of fall(s) - -  Risk for fall due to: Comment - - - - -  Follow up Falls prevention discussed Falls evaluation completed Falls prevention discussed Falls evaluation completed Education provided;Falls evaluation completed;Falls prevention discussed    FALL RISK PREVENTION PERTAINING TO THE HOME:  Any stairs in or around the home? Yes  If so, are there any without handrails? No  Home free of loose throw rugs in walkways, pet beds, electrical cords, etc? Yes  Adequate lighting in your home to reduce risk of falls? Yes   ASSISTIVE DEVICES UTILIZED TO PREVENT FALLS:  Life alert? No  Use of a cane, walker or w/c? No  Grab bars in the bathroom? No  Shower chair or bench in shower? No  Elevated toilet seat or a handicapped toilet? No   TIMED UP AND GO:  Was the test performed? No .   Cognitive Function: MMSE - Mini Mental State Exam 04/09/2017 10/18/2015  Not completed: - (No Data)  Orientation to time 5 -  Orientation to Place 5 -  Registration 3 -  Attention/ Calculation 5 -  Recall 3 -  Language- name 2 objects 2 -  Language- repeat 1 -  Language- follow 3 step command 3 -  Language- read & follow direction 1 -  Write a sentence 1 -  Copy design 1 -  Total score 30 -     6CIT Screen 03/29/2021  What Year? 0 points  What month? 0 points  What time? 0 points  Count back from 20 0 points  Months in reverse 0 points  Repeat phrase 0 points  Total Score 0    Immunizations Immunization History  Administered Date(s) Administered   Hepatitis A, Adult 11/30/2016   Influenza, High Dose Seasonal PF 10/18/2015, 11/12/2016, 10/18/2017, 09/15/2018, 10/11/2020   Influenza,inj,Quad PF,6+ Mos 10/15/2012, 10/06/2014   Influenza-Unspecified 10/15/2012, 10/14/2019   PFIZER(Purple Top)SARS-COV-2 Vaccination 02/19/2019, 03/12/2019, 10/12/2019, 04/27/2020   Pfizer Covid-19 Vaccine Bivalent Booster 6yrs & up 10/11/2020    Pneumococcal Conjugate-13 10/29/2014   Pneumococcal Polysaccharide-23 11/01/2015   Td 10/24/2004   Tdap 10/29/2014   Zoster Recombinat (Shingrix) 04/11/2017, 07/25/2017   Zoster, Live 01/30/2007    TDAP status: Up to date  Flu Vaccine status: Up to date  Pneumococcal vaccine status: Up to date  Covid-19 vaccine status: Completed vaccines  Qualifies for Shingles Vaccine? Yes   Zostavax completed Yes   Shingrix Completed?: Yes  Screening Tests Health Maintenance  Topic Date Due   MAMMOGRAM  06/02/2021   COLONOSCOPY (Pts 45-42yrs Insurance coverage will need to be confirmed)  08/20/2021   DEXA SCAN  10/06/2021   TETANUS/TDAP  10/28/2024   Pneumonia Vaccine 1+ Years old  Completed   INFLUENZA VACCINE  Completed   COVID-19 Vaccine  Completed   Hepatitis C Screening  Completed   Zoster Vaccines- Shingrix  Completed   HPV VACCINES  Aged Out    Health Maintenance  There are no preventive care reminders to display for this patient.  Colorectal cancer screening: Type of screening: Colonoscopy. Completed 08/21/18. Repeat every 3 years  Mammogram status: Completed 06/02/20. Repeat every year  Bone Density status: Completed 10/27/18. Results reflect: Bone density results: OSTEOPENIA. Repeat every 3 years.   Additional Screening:  Hepatitis C Screening:  Completed 11/10/15  Vision Screening: Recommended annual ophthalmology exams for early detection of glaucoma and other disorders of the eye. Is the patient up to date with their annual eye exam?  Yes  Who is the provider or what is the name of the office in which the patient attends annual eye exams? Dr Gershon Crane  If pt is not established with a provider, would they like to be referred to a provider to establish care? No .   Dental Screening: Recommended annual dental exams for proper oral hygiene  Community Resource Referral / Chronic Care Management: CRR required this visit?  No   CCM required this visit?  No      Plan:      I have personally reviewed and noted the following in the patients chart:   Medical and social history Use of alcohol, tobacco or illicit drugs  Current medications and supplements including opioid prescriptions.  Functional ability and status Nutritional status Physical activity Advanced directives List of other physicians Hospitalizations, surgeries, and ER visits in previous 12 months Vitals Screenings to include cognitive, depression, and falls Referrals and appointments  In addition, I have reviewed and discussed with patient certain preventive protocols, quality metrics, and best practice recommendations. A written personalized care plan for preventive services as well as general preventive health recommendations were provided to patient.     Willette Brace, LPN   05/05/8030   Nurse Notes: Pt request a referral to a urologist related leakage of urine and loose stools at times please advise

## 2021-03-29 NOTE — Patient Instructions (Signed)
[name] , Thank you for taking time to come for your Medicare Wellness Visit. I appreciate your ongoing commitment to your health goals. Please review the following plan we discussed and let me know if I can assist you in the future.   Screening recommendations/referrals: Colonoscopy: Done 08/21/18 repeat every 3 years due 08/20/21 Mammogram: Done 06/02/20 repeat every year due 06/02/21 Bone Density: Done 10/07/18 repeat every 2 years  Recommended yearly ophthalmology/optometry visit for glaucoma screening and checkup Recommended yearly dental visit for hygiene and checkup  Vaccinations: Influenza vaccine: Done 10/11/20 repeat every year  Pneumococcal vaccine: Up to date Tdap vaccine: Done 10/29/14 repeat every 10 years  Shingles vaccine: Completed 3/14, 07/25/17   Covid-19:Completed 1/21, 2/11, 10/12/19, & 3/30, 10/11/20  Advanced directives: Please bring a copy of your health care power of attorney and living will to the office at your convenience.  Conditions/risks identified: Lose 30 lbs  Next appointment: Follow up in one year for your annual wellness visit    Preventive Care 28 Years and Older, Female Preventive care refers to lifestyle choices and visits with your health care provider that can promote health and wellness. What does preventive care include? A yearly physical exam. This is also called an annual well check. Dental exams once or twice a year. Routine eye exams. Ask your health care provider how often you should have your eyes checked. Personal lifestyle choices, including: Daily care of your teeth and gums. Regular physical activity. Eating a healthy diet. Avoiding tobacco and drug use. Limiting alcohol use. Practicing safe sex. Taking low-dose aspirin every day. Taking vitamin and mineral supplements as recommended by your health care provider. What happens during an annual well check? The services and screenings done by your health care provider during your annual  well check will depend on your age, overall health, lifestyle risk factors, and family history of disease. Counseling  Your health care provider may ask you questions about your: Alcohol use. Tobacco use. Drug use. Emotional well-being. Home and relationship well-being. Sexual activity. Eating habits. History of falls. Memory and ability to understand (cognition). Work and work Statistician. Reproductive health. Screening  You may have the following tests or measurements: Height, weight, and BMI. Blood pressure. Lipid and cholesterol levels. These may be checked every 5 years, or more frequently if you are over 42 years old. Skin check. Lung cancer screening. You may have this screening every year starting at age 77 if you have a 30-pack-year history of smoking and currently smoke or have quit within the past 15 years. Fecal occult blood test (FOBT) of the stool. You may have this test every year starting at age 55. Flexible sigmoidoscopy or colonoscopy. You may have a sigmoidoscopy every 5 years or a colonoscopy every 10 years starting at age 85. Hepatitis C blood test. Hepatitis B blood test. Sexually transmitted disease (STD) testing. Diabetes screening. This is done by checking your blood sugar (glucose) after you have not eaten for a while (fasting). You may have this done every 1-3 years. Bone density scan. This is done to screen for osteoporosis. You may have this done starting at age 91. Mammogram. This may be done every 1-2 years. Talk to your health care provider about how often you should have regular mammograms. Talk with your health care provider about your test results, treatment options, and if necessary, the need for more tests. Vaccines  Your health care provider may recommend certain vaccines, such as: Influenza vaccine. This is recommended every year.  Tetanus, diphtheria, and acellular pertussis (Tdap, Td) vaccine. You may need a Td booster every 10 years. Zoster  vaccine. You may need this after age 14. Pneumococcal 13-valent conjugate (PCV13) vaccine. One dose is recommended after age 70. Pneumococcal polysaccharide (PPSV23) vaccine. One dose is recommended after age 40. Talk to your health care provider about which screenings and vaccines you need and how often you need them. This information is not intended to replace advice given to you by your health care provider. Make sure you discuss any questions you have with your health care provider. Document Released: 02/11/2015 Document Revised: 10/05/2015 Document Reviewed: 11/16/2014 Elsevier Interactive Patient Education  2017 [place] Prevention in the Home Falls can cause injuries. They can happen to people of all ages. There are many things you can do to make your home safe and to help prevent falls. What can I do on the outside of my home? Regularly fix the edges of walkways and driveways and fix any cracks. Remove anything that might make you trip as you walk through a door, such as a raised step or threshold. Trim any bushes or trees on the path to your home. Use bright outdoor lighting. Clear any walking paths of anything that might make someone trip, such as rocks or tools. Regularly check to see if handrails are loose or broken. Make sure that both sides of any steps have handrails. Any raised decks and porches should have guardrails on the edges. Have any leaves, snow, or ice cleared regularly. Use sand or salt on walking paths during winter. Clean up any spills in your garage right away. This includes oil or grease spills. What can I do in the bathroom? Use night lights. Install grab bars by the toilet and in the tub and shower. Do not use towel bars as grab bars. Use non-skid mats or decals in the tub or shower. If you need to sit down in the shower, use a plastic, non-slip stool. Keep the floor dry. Clean up any water that spills on the floor as soon as it happens. Remove  soap buildup in the tub or shower regularly. Attach bath mats securely with double-sided non-slip rug tape. Do not have throw rugs and other things on the floor that can make you trip. What can I do in the bedroom? Use night lights. Make sure that you have a light by your bed that is easy to reach. Do not use any sheets or blankets that are too big for your bed. They should not hang down onto the floor. Have a firm chair that has side arms. You can use this for support while you get dressed. Do not have throw rugs and other things on the floor that can make you trip. What can I do in the kitchen? Clean up any spills right away. Avoid walking on wet floors. Keep items that you use a lot in easy-to-reach places. If you need to reach something above you, use a strong step stool that has a grab bar. Keep electrical cords out of the way. Do not use floor polish or wax that makes floors slippery. If you must use wax, use non-skid floor wax. Do not have throw rugs and other things on the floor that can make you trip. What can I do with my stairs? Do not leave any items on the stairs. Make sure that there are handrails on both sides of the stairs and use them. Fix handrails that are broken or loose.  Make sure that handrails are as long as the stairways. Check any carpeting to make sure that it is firmly attached to the stairs. Fix any carpet that is loose or worn. Avoid having throw rugs at the top or bottom of the stairs. If you do have throw rugs, attach them to the floor with carpet tape. Make sure that you have a light switch at the top of the stairs and the bottom of the stairs. If you do not have them, ask someone to add them for you. What else can I do to help prevent falls? Wear shoes that: Do not have high heels. Have rubber bottoms. Are comfortable and fit you well. Are closed at the toe. Do not wear sandals. If you use a stepladder: Make sure that it is fully opened. Do not climb a  closed stepladder. Make sure that both sides of the stepladder are locked into place. Ask someone to hold it for you, if possible. Clearly mark and make sure that you can see: Any grab bars or handrails. First and last steps. Where the edge of each step is. Use tools that help you move around (mobility aids) if they are needed. These include: Canes. Walkers. Scooters. Crutches. Turn on the lights when you go into a dark area. Replace any light bulbs as soon as they burn out. Set up your furniture so you have a clear path. Avoid moving your furniture around. If any of your floors are uneven, fix them. If there are any pets around you, be aware of where they are. Review your medicines with your doctor. Some medicines can make you feel dizzy. This can increase your chance of falling. Ask your doctor what other things that you can do to help prevent falls. This information is not intended to replace advice given to you by your health care provider. Make sure you discuss any questions you have with your health care provider. Document Released: 11/11/2008 Document Revised: 06/23/2015 Document Reviewed: 02/19/2014 Elsevier Interactive Patient Education  2017 [employer].

## 2021-04-17 ENCOUNTER — Ambulatory Visit: Payer: Medicare Other | Admitting: Family Medicine

## 2021-04-17 ENCOUNTER — Other Ambulatory Visit: Payer: Self-pay

## 2021-04-17 ENCOUNTER — Ambulatory Visit: Payer: Self-pay

## 2021-04-17 ENCOUNTER — Encounter: Payer: Self-pay | Admitting: Family Medicine

## 2021-04-17 VITALS — BP 104/70 | HR 71 | Ht 62.0 in | Wt 168.6 lb

## 2021-04-17 DIAGNOSIS — M25521 Pain in right elbow: Secondary | ICD-10-CM | POA: Diagnosis not present

## 2021-04-17 DIAGNOSIS — M25551 Pain in right hip: Secondary | ICD-10-CM | POA: Diagnosis not present

## 2021-04-17 DIAGNOSIS — M7061 Trochanteric bursitis, right hip: Secondary | ICD-10-CM | POA: Diagnosis not present

## 2021-04-17 DIAGNOSIS — M7711 Lateral epicondylitis, right elbow: Secondary | ICD-10-CM | POA: Diagnosis not present

## 2021-04-17 NOTE — Progress Notes (Signed)
? ?  I, Rebecca Long, LAT, ATC, am serving as scribe for Dr. [name]. ? ?Rebecca Long is a 71 y.o. female who presents to [place] at [facility] today for f/u of R hip pain.  She was last seen by Dr. Georgina Snell on 11/04/20 prior to a trip to [place] and had a R GT steroid injection.  She had previously completed PT at [facility] and had been shown a HEP.  Today, pt reports that her R hip pain has returned over the past 1-2 months and locates her pain to her R lateral hip.  She has not been doing her HEP and is interested in another injection. ? ?She is also c/o R lateral epicondyle pain x few months.  She has a hx of R lateral epicondylitis and had a prior steroid injection. ? ?Diagnostic testing: R hip and L-spine XR- 11/04/20 ? ?Pertinent review of systems: No fevers or chills ? ?Relevant historical information: Hyper lipidemia.  BPV. ? ? ?Exam:  ?BP 104/70 (BP Location: Right Arm, Patient Position: Sitting, Cuff Size: Normal)   Pulse 71   Ht '5\' 2"'$  (1.575 m)   Wt 168 lb 9.6 oz (76.5 kg)   SpO2 96%   BMI 30.84 kg/m?  ?General: Well Developed, well nourished, and in no acute distress.  ? ?MSK: Right hip: Normal-appearing ?Tender palpation of the greater trochanter. ?Hip abduction strength and external rotation strength are diminished with pain 4/5. ?Lengths are equal. ? ?Right elbow mildly tender palpation at lateral epicondyle.  Some pain with resisted wrist extension. ? ? ? ?Lab and Radiology Results ? ?Hip greater trochanteric injection: right ?Consent obtained and timeout performed. ?Area of maximum tenderness palpated and identified. ?Skin cleaned with alcohol, cold spray applied. ?A spinal needle was used to access the greater trochanteric bursa. ?'40mg'$  of Kenalog and 2 mL of Marcaine were used to inject the trochanteric bursa. ?Patient tolerated the procedure well. ? ? ?EXAM: ?DG HIP (WITH OR WITHOUT PELVIS) 2-3V RIGHT ?  ?COMPARISON:  None. ?  ?FINDINGS: ?There is no evidence of  hip fracture or dislocation. There is no ?evidence of arthropathy or other focal bone abnormality. ?  ?IMPRESSION: ?Negative. ?  ?  ?Electronically Signed ?  By: Fidela Salisbury M.D. ?  On: 11/07/2020 03:15 ?  ?I, [name], personally (independently) visualized and performed the interpretation of the images attached in this note. ? ? ? ?Assessment and Plan: ?70 y.o. female with right greater trochanteric bursitis.  Plan for repeat steroid injection today.  Stressed the importance of strengthening exercises.  She will be working on this on her own with home exercise program that she is previously been taught with PT.  If this is not sufficient she will let me know and I can refer her to formal physical therapy.  She lives in the  [place] area so there are several physical therapy options that we could pick from. ? ?Additionally she has some mild lateral epicondylitis.  She does not think it is bad enough for an injection today.  I reviewed with her some basic home exercises prior to discharge. ? ? ? ? ?Discussed warning signs or symptoms. Please see discharge instructions. Patient expresses understanding. ? ? ?The above documentation has been reviewed and is accurate and complete [name], M.D. ? ? ?

## 2021-04-17 NOTE — Patient Instructions (Addendum)
Good to see you today. ? ?You had a R hip (GT) injection.  Call or go to the ER if you develop a large red swollen joint with extreme pain or oozing puss.  ? ?Follow-up: as needed ?

## 2021-05-23 ENCOUNTER — Ambulatory Visit: Payer: Medicare Other | Admitting: Family Medicine

## 2021-05-23 VITALS — BP 132/82 | HR 60 | Ht 62.0 in | Wt 165.8 lb

## 2021-05-23 DIAGNOSIS — M7061 Trochanteric bursitis, right hip: Secondary | ICD-10-CM

## 2021-05-23 MED ORDER — PREDNISONE 50 MG PO TABS: 50.0000 mg | ORAL_TABLET | Freq: Every day | ORAL | 0 refills | Status: DC

## 2021-05-23 NOTE — Patient Instructions (Addendum)
Thank you for coming in today.  ? ?I've sent a prescription for prednisone to your pharmacy.  ? ?I've referred you to Physical Therapy.  Let us know if you don't hear from them in one week.  ? ?OK to take Tylenol. ? ?Recheck back in 6 weeks ?

## 2021-05-23 NOTE — Progress Notes (Signed)
? ?  I, Rebecca Long, LAT, ATC acting as a scribe for Rebecca Leader, MD. ? ?Rebecca Long is a 71 y.o. female who presents to [place] at [facility] today for continued R hip pain. Pt was last seen by Dr. Georgina Snell on 04/17/21 and was given a R GT steroid injection and advised to work on strengthening exercises at home. Today, pt reports only a few days of relief from prior GT steroid injection. Pt notes possibly aggravating R hip playing pickle ball yesterday. Pt locates pain to the lateral aspect of the R hip, into lateral thigh, w/ some pain on the R-side of her low back. ? ?Dx imaging: 11/04/20 R hip & L-spine XR ?  ?Pertinent review of systems: No fevers or chills ? ?Relevant historical information: Lives in [place]. ? ? ?Exam:  ?BP 132/82   Pulse 60   Ht '5\' 2"'$  (1.575 m)   Wt 165 lb 12.8 oz (75.2 kg)   SpO2 98%   BMI 30.33 kg/m?  ?General: Well Developed, well nourished, and in no acute distress.  ? ?MSK: Right hip normal. ?Tender palpation greater trochanter. ?Pain with resisted hip abduction.  Hip adduction strength diminished 4/5. ? ? ? ?Lab and Radiology Results ?EXAM: ?DG HIP (WITH OR WITHOUT PELVIS) 2-3V RIGHT ?  ?COMPARISON:  None. ?  ?FINDINGS: ?There is no evidence of hip fracture or dislocation. There is no ?evidence of arthropathy or other focal bone abnormality. ?  ?IMPRESSION: ?Negative. ?  ?  ?Electronically Signed ?  By: Fidela Salisbury M.D. ?  On: 11/07/2020 03:15 ?I, Rebecca Long, personally (independently) visualized and performed the interpretation of the images attached in this note. ? ? ? ? ? ?Assessment and Plan: ?71 y.o. female with right hip abductor tendinitis or greater trochanteric bursitis.  This is an acute exacerbation of a chronic problem.  Plan for referral back to PT.  We will use [facility] PT in [place].  She is traveling in the next few days to [place].  Recommend Tylenol and ibuprofen.  Emergency backup plan for prednisone if needed.  Unfortunately she is  too soon after an injection for an injection today.  Recheck in about 6 weeks.  If needed in the future.  Consider MRI or injection. ? ? ?PDMP not reviewed this encounter. ?Orders Placed This Encounter  ?Procedures  ? Ambulatory referral to Physical Therapy  ?  Referral Priority:   Routine  ?  Referral Type:   Physical Medicine  ?  Referral Reason:   Specialty Services Required  ?  Requested Specialty:   Physical Therapy  ?  Number of Visits Requested:   1  ? ?Meds ordered this encounter  ?Medications  ? predniSONE (DELTASONE) 50 MG tablet  ?  Sig: Take 1 tablet (50 mg total) by mouth daily.  ?  Dispense:  5 tablet  ?  Refill:  0  ? ? ? ?Discussed warning signs or symptoms. Please see discharge instructions. Patient expresses understanding. ? ? ?The above documentation has been reviewed and is accurate and complete Rebecca Long, M.D. ? ? ?

## 2021-06-01 ENCOUNTER — Ambulatory Visit: Payer: Medicare Other | Admitting: Gastroenterology

## 2021-06-05 ENCOUNTER — Encounter: Payer: Self-pay | Admitting: Family Medicine

## 2021-06-05 DIAGNOSIS — Z1231 Encounter for screening mammogram for malignant neoplasm of breast: Secondary | ICD-10-CM | POA: Diagnosis not present

## 2021-06-06 ENCOUNTER — Ambulatory Visit: Payer: Medicare Other | Admitting: Gastroenterology

## 2021-06-06 ENCOUNTER — Other Ambulatory Visit: Payer: Medicare Other

## 2021-06-06 ENCOUNTER — Encounter: Payer: Self-pay | Admitting: Gastroenterology

## 2021-06-06 VITALS — BP 120/78 | HR 75 | Ht 62.0 in | Wt 165.2 lb

## 2021-06-06 DIAGNOSIS — R194 Change in bowel habit: Secondary | ICD-10-CM

## 2021-06-06 DIAGNOSIS — Z86010 Personal history of colonic polyps: Secondary | ICD-10-CM

## 2021-06-06 DIAGNOSIS — R152 Fecal urgency: Secondary | ICD-10-CM

## 2021-06-06 DIAGNOSIS — R159 Full incontinence of feces: Secondary | ICD-10-CM

## 2021-06-06 DIAGNOSIS — R197 Diarrhea, unspecified: Secondary | ICD-10-CM | POA: Diagnosis not present

## 2021-06-06 MED ORDER — PLENVU 140 G PO SOLR: 140.0000 g | ORAL | 0 refills | Status: DC

## 2021-06-06 NOTE — Patient Instructions (Addendum)
If you are age 72 or older, your body mass index should be between 23-30. Your Body mass index is 30.22 kg/m?Marland Kitchen If this is out of the aforementioned range listed, please consider follow up with your Primary Care Provider. ? ?If you are age 17 or younger, your body mass index should be between 19-25. Your Body mass index is 30.22 kg/m?Marland Kitchen If this is out of the aformentioned range listed, please consider follow up with your Primary Care Provider.  ? ?________________________________________________________ ? ?The [place] [facility] providers would like to encourage you to use [facility] to communicate with providers for non-urgent requests or questions.  Due to long hold times on the telephone, sending your provider a message by [facility] may be a faster and more efficient way to get a response.  Please allow 48 business hours for a response.  Please remember that this is for non-urgent requests.  ?_______________________________________________________ ? ?You have been scheduled for a colonoscopy. Please follow written instructions given to you at your visit today.  ?Please pick up your prep supplies at the pharmacy within the next 1-3 days. ?If you use inhalers (even only as needed), please bring them with you on the day of your procedure. ? ?Your provider has requested that you go to the basement level for lab work before leaving today. Press "B" on the elevator. The lab is located at the first door on the left as you exit the elevator. ? ?Due to recent changes in healthcare laws, you may see the results of your imaging and laboratory studies on MyChart before your provider has had a chance to review them.  We understand that in some cases there may be results that are confusing or concerning to you. Not all laboratory results come back in the same time frame and the provider may be waiting for multiple results in order to interpret others.  Please give [place] 48 hours in order for your provider to thoroughly review all the results  before contacting the office for clarification of your results.  ?_______________________________________________________ ? ? ?It was a pleasure to see you today! ? ?Thank you for trusting me with your gastrointestinal care!   ? ? ?

## 2021-06-06 NOTE — Progress Notes (Signed)
? ? ? ?06/06/2021 ?Rebecca Long ?[id number] ?06-14-1950 ? ? ?HISTORY OF PRESENT ILLNESS: This is a 71 year old female who is a patient of Dr. [name].  She was last seen here in February 2022.  She presents here today with complaints of diarrhea.  She says that over the past several months she has had gradually worsening bowel issues.  She says that she has diarrhea every day.  She has had increasing frequency of her stools.  Has experienced several episodes of incontinence.  She says that she used to have one bowel movement daily that was formed.  She was at least on some antibiotics in January for UTI.  She cannot recall others.  Some bright red rectal bleeding that she contributes to hemorrhoids.  Did have positive fecal occult blood test, but she admits to actually seeing blood at the time of this were performed. ? ?Colonoscopy 07/2018: ? ?- One 10 mm polyp in the ascending colon, removed with a hot snare. Resected and ?retrieved. ?- One 16 mm polyp in the ascending colon, removed with a hot snare. Resected and ?retrieved. ?- Two 5 mm polyps in the transverse colon, removed with a cold biopsy forceps. Resected and ?retrieved. ?- One 7 mm polyp in the sigmoid colon, removed with a cold snare. Resected and retrieved. ?- Mild diverticulosis in the left colon. ?- The examination was otherwise normal on direct and retroflexion views. ? ?1. Surgical [P], colon, ascending, polyp ?- TUBULAR ADENOMA (MULTIPLE FRAGMENTS). ?- NO HIGH GRADE DYSPLASIA OR MALIGNANCY. ?2. Surgical [P], colon, ascending, polyp ?- TUBULAR ADENOMA (MULTIPLE FRAGMENTS). ?- NO HIGH GRADE DYSPLASIA OR MALIGNANCY. ?3. Surgical [P], colon, transverse, sigmoid, polyp (3) ?- TUBULAR ADENOMA (MULTIPLE FRAGMENTS). ?- NO HIGH GRADE DYSPLASIA OR MALIGNANCY. ? ? ?Past Medical History:  ?Diagnosis Date  ? Anxiety   ? Cavus foot, acquired 09/09/2012  ?  bilateral cavus feet the one on the left shows that she has some probable neurogenic weakness with abnormal  curvature and in inversion contracture Podiatry 06/2015: H&P and x-ray reviewed with patient. Today I went ahead and I did a proximal nerve block I was able to aspirate the second MPJ and got out amount of clear fluid and injected with a quarter cc dexamethasone Kenalog and ap  ? DDD (degenerative disc disease), lumbar   ? Depression   ? Endometrial polyp   ? GERD (gastroesophageal reflux disease)   ? Hiatal hernia   ? Hip flexor tendinitis, right 07/02/2016  ? History of adenomatous polyp of colon   ? History of esophageal stricture   ? History of gastric polyp 11/2017  ? History of primary hyperparathyroidism   ? s/p  left parathryoidectomy 09-17-2008-- resolved  ? Hyperlipidemia   ? Hypertension   ? Insomnia   ? Lumbar stenosis   ? Osteopenia   ? Schatzki's ring   ? Scoliosis   ? Tendinopathy of right biceps tendon 07/02/2016  ? Thickened endometrium   ? Uterine fibroid   ? Wears glasses   ? ?Past Surgical History:  ?Procedure Laterality Date  ? BREAST REDUCTION SURGERY Bilateral 2000  ? COLONOSCOPY    ? CYST EXCISION  1985  ? head and rt axilla  ? DILATATION & CURETTAGE/HYSTEROSCOPY WITH MYOSURE N/A 08/13/2018  ? Procedure: [place];  Surgeon: Princess Bruins, MD;  Location: [facility];  Service: Gynecology;  Laterality: N/A;  requests 10:15am in [place] [facility] block ?request 30 minutes  ? DILATION AND CURETTAGE OF UTERUS N/A  08/13/2018  ? Reserve  ? PARATHYROIDECTOMY Left 09/17/2008   dr Ronnald Collum  '@[facility]'$   ? left superior  (adenoma)  ? SHOULDER SURGERY Right 2007  ? "frozen"  ? UPPER GASTROINTESTINAL ENDOSCOPY  last one 12-18-2017   dr Rush Landmark  ? ? reports that she quit smoking about 30 years ago. Her smoking use included cigarettes. She has never used smokeless tobacco. She reports that she does not currently use alcohol. She reports that she does not use drugs. ?family history includes Breast cancer in her maternal aunt and mother;  Heart disease (age of onset: 29) in her father; Hypertension in her brother and sister. ?Allergies  ?Allergen Reactions  ? Latex Itching and Rash  ? Polysporin [Bacitracin-Polymyxin B] Rash  ? ? ?  ?Outpatient Encounter Medications as of 06/06/2021  ?Medication Sig  ? atorvastatin (LIPITOR) 20 MG tablet Take 1 tablet (20 mg total) by mouth daily.  ? Cholecalciferol (VITAMIN D3) 10 MCG (400 UNIT) CAPS SMARTSIG:1 Capsule(s) By Mouth  ? FLUoxetine (PROZAC) 40 MG capsule Take 1 capsule (40 mg total) by mouth daily.  ? omeprazole (PRILOSEC) 40 MG capsule Take 1 capsule (40 mg total) by mouth daily.  ? telmisartan (MICARDIS) 80 MG tablet Take 1 tablet (80 mg total) by mouth daily.  ? valACYclovir (VALTREX) 500 MG tablet Take 500 mg by mouth as needed.   ? zolpidem (AMBIEN) 5 MG tablet Take 0.5-1 tablets (2.5-5 mg total) by mouth at bedtime.  ? [DISCONTINUED] Hydrocortisone Acetate 1 % OINT Apply twice daily until symptoms are relieved. (Patient not taking: Reported on 03/29/2021)  ? [DISCONTINUED] predniSONE (DELTASONE) 50 MG tablet Take 1 tablet (50 mg total) by mouth daily.  ? ?No facility-administered encounter medications on file as of 06/06/2021.  ? ? ? ?REVIEW OF SYSTEMS  : All other systems reviewed and negative except where noted in the History of Present Illness. ? ? ?PHYSICAL EXAM: ?BP 120/78   Pulse 75   Ht '5\' 2"'$  (1.575 m)   Wt 165 lb 4 oz (75 kg)   SpO2 97%   BMI 30.22 kg/m?  ?General: Well developed white female in no acute distress ?Head: Normocephalic and atraumatic ?Eyes:  Sclerae anicteric, conjunctiva pink. ?Ears: Normal auditory acuity ?Lungs: Clear throughout to auscultation; no W/R/R. ?Heart: Regular rate and rhythm; no W/R/R. ?Abdomen: Soft, non-distended.  BS present.  Non-tender. ?Rectal:  Will be done at the time of colonoscopy. ?Musculoskeletal: Symmetrical with no gross deformities  ?Skin: No lesions on visible extremities ?Extremities: No edema  ?Neurological: Alert oriented x 4, grossly  non-focal ?Psychological:  Alert and cooperative. Normal mood and affect ? ?ASSESSMENT AND PLAN: ?*Personal history of colon polyps: Several tubular adenomas, some larger in size, removed in July 2020.  Repeat recommended 3-year interval.  We will schedule with Dr. Fuller Plan. ?*Change in bowel habits with loose stools/diarrhea and incontinence: This has occurred over the last several months.  She had taken some antibiotics for a UTI back in January.  We will check a stool for C. difficile.  If that is negative then we will plan for colonoscopy and consideration of random biopsies to rule out microscopic colitis, etc. ? ?**The risks, benefits, and alternatives to colonoscopy were discussed with the patient and she consents to proceed.  ? ?CC:  Kuneff, Renee A, DO ? ?  ?

## 2021-06-07 ENCOUNTER — Telehealth: Payer: Self-pay

## 2021-06-07 ENCOUNTER — Other Ambulatory Visit: Payer: Medicare Other

## 2021-06-07 DIAGNOSIS — R262 Difficulty in walking, not elsewhere classified: Secondary | ICD-10-CM | POA: Diagnosis not present

## 2021-06-07 DIAGNOSIS — M25551 Pain in right hip: Secondary | ICD-10-CM | POA: Diagnosis not present

## 2021-06-07 DIAGNOSIS — M25651 Stiffness of right hip, not elsewhere classified: Secondary | ICD-10-CM | POA: Diagnosis not present

## 2021-06-07 DIAGNOSIS — R194 Change in bowel habit: Secondary | ICD-10-CM | POA: Diagnosis not present

## 2021-06-07 DIAGNOSIS — R197 Diarrhea, unspecified: Secondary | ICD-10-CM | POA: Diagnosis not present

## 2021-06-07 DIAGNOSIS — Z86010 Personal history of colonic polyps: Secondary | ICD-10-CM | POA: Diagnosis not present

## 2021-06-07 NOTE — Telephone Encounter (Signed)
Please inform patient her mammogram is normal.  

## 2021-06-07 NOTE — Telephone Encounter (Signed)
Received Mammogram results from [place] on 06/05/21. Will place on PCP desk for review.  ?

## 2021-06-07 NOTE — Telephone Encounter (Signed)
Spoke with patient regarding results/recommendations.  

## 2021-06-09 LAB — CLOSTRIDIUM DIFFICILE EIA: C difficile Toxins A+B, EIA: NEGATIVE

## 2021-06-13 DIAGNOSIS — M25651 Stiffness of right hip, not elsewhere classified: Secondary | ICD-10-CM | POA: Diagnosis not present

## 2021-06-13 DIAGNOSIS — M25551 Pain in right hip: Secondary | ICD-10-CM | POA: Diagnosis not present

## 2021-06-13 DIAGNOSIS — R262 Difficulty in walking, not elsewhere classified: Secondary | ICD-10-CM | POA: Diagnosis not present

## 2021-06-15 DIAGNOSIS — M25551 Pain in right hip: Secondary | ICD-10-CM | POA: Diagnosis not present

## 2021-06-15 DIAGNOSIS — M25651 Stiffness of right hip, not elsewhere classified: Secondary | ICD-10-CM | POA: Diagnosis not present

## 2021-06-15 DIAGNOSIS — R262 Difficulty in walking, not elsewhere classified: Secondary | ICD-10-CM | POA: Diagnosis not present

## 2021-06-16 ENCOUNTER — Other Ambulatory Visit: Payer: Self-pay | Admitting: Family Medicine

## 2021-06-19 ENCOUNTER — Telehealth: Payer: Self-pay | Admitting: Family Medicine

## 2021-06-19 DIAGNOSIS — M7061 Trochanteric bursitis, right hip: Secondary | ICD-10-CM

## 2021-06-19 DIAGNOSIS — M25551 Pain in right hip: Secondary | ICD-10-CM

## 2021-06-19 NOTE — Telephone Encounter (Signed)
Pt called to schedule steroid inj R hip. Last one done 04/17/2021, so too early.  Pt concerned as she has been struggling and this last injection did not help for long at all.  Is there something else we can do or is it time for an MRI?

## 2021-06-20 NOTE — Telephone Encounter (Signed)
Spoke to pt and let her know MRI was ordered. Instructed pt if she doesn't hear about scheduling to let us know.

## 2021-06-20 NOTE — Telephone Encounter (Signed)
I think it is time for an MRI.  MRI ordered.  You should hear soon about scheduling.

## 2021-06-21 ENCOUNTER — Other Ambulatory Visit: Payer: Self-pay | Admitting: Family Medicine

## 2021-06-21 DIAGNOSIS — I10 Essential (primary) hypertension: Secondary | ICD-10-CM

## 2021-06-24 ENCOUNTER — Ambulatory Visit (INDEPENDENT_AMBULATORY_CARE_PROVIDER_SITE_OTHER): Payer: Medicare Other

## 2021-06-24 DIAGNOSIS — M25551 Pain in right hip: Secondary | ICD-10-CM

## 2021-06-24 DIAGNOSIS — G8929 Other chronic pain: Secondary | ICD-10-CM

## 2021-06-24 DIAGNOSIS — M1611 Unilateral primary osteoarthritis, right hip: Secondary | ICD-10-CM | POA: Diagnosis not present

## 2021-06-24 DIAGNOSIS — M25451 Effusion, right hip: Secondary | ICD-10-CM | POA: Diagnosis not present

## 2021-06-24 DIAGNOSIS — M7061 Trochanteric bursitis, right hip: Secondary | ICD-10-CM | POA: Diagnosis not present

## 2021-06-27 NOTE — Progress Notes (Signed)
Hip MRI shows mild arthritis and mild gluteus minimus tendinitis.  The tendinitis is probably the source of her pain.  There is a uterine fibroid but that almost certainly is not causing her pain.  Recommend return to clinic to go over the results in full detail and probably do another injection at the tendon insertion site which believe is the source of your pain.

## 2021-06-29 ENCOUNTER — Encounter: Payer: Self-pay | Admitting: Family Medicine

## 2021-06-29 ENCOUNTER — Ambulatory Visit: Payer: Medicare Other | Admitting: Family Medicine

## 2021-06-29 ENCOUNTER — Ambulatory Visit: Payer: Self-pay

## 2021-06-29 VITALS — BP 104/68 | HR 72 | Ht 62.0 in | Wt 166.2 lb

## 2021-06-29 DIAGNOSIS — M25551 Pain in right hip: Secondary | ICD-10-CM

## 2021-06-29 DIAGNOSIS — M7061 Trochanteric bursitis, right hip: Secondary | ICD-10-CM | POA: Diagnosis not present

## 2021-06-29 NOTE — Patient Instructions (Addendum)
Good to see you today.  You had a R hip (GT) injection.  Call or go to the ER if you develop a large red swollen joint with extreme pain or oozing puss.   Follow-up: as needed

## 2021-06-29 NOTE — Progress Notes (Signed)
I, Wendy Poet, LAT, ATC, am serving as scribe for Dr. [name].  Rebecca Long is a 71 y.o. female who presents to [place] at [facility] today for continue R hip pain and R hip MRI review. Pt was last seen by Dr. Georgina Snell on 05/23/21 and was re-referred to [facility] PT in [facility], prescribed an emergency back up of prednisone, and advised to use Tylenol or IBU. Pt called the office on 06/19/21 reporting she was struggling with her R hip and the steroid injection did not help for long and a MRI was ordered. Today, pt reports that she did go to PT but only completed 3 visits.  She was shown a HEP per PT and has been doing her HEP.  She feels like her hip pain is worse.  Dx imaging: 06/24/21 R hip MRI 11/04/20 R hip & L-spine XR  Pertinent review of systems: No fevers or chills  Relevant historical information: Hypertension   Exam:  BP 104/68 (BP Location: Right Arm, Patient Position: Sitting, Cuff Size: Normal)   Pulse 72   Ht '5\' 2"'$  (1.575 m)   Wt 166 lb 3.2 oz (75.4 kg)   SpO2 93%   BMI 30.40 kg/m  General: Well Developed, well nourished, and in no acute distress.   MSK: Right lateral hip: Tender palpation greater trochanter. Normal hip motion.  Pain with abduction.    Lab and Radiology Results  Procedure: Real-time Ultrasound Guided Injection of right lateral hip greater trochanter bursa (at gluteus minimus tendon insertion) Device: Philips Affiniti 50G Images permanently stored and available for review in PACS Verbal informed consent obtained.  Discussed risks and benefits of procedure. Warned about infection, bleeding, hyperglycemia damage to structures among others. Patient expresses understanding and agreement Time-out conducted.   Noted no overlying erythema, induration, or other signs of local infection.   Skin prepped in a sterile fashion.   Local anesthesia: Topical Ethyl chloride.   With sterile technique and under real time ultrasound  guidance: 40 mg of Kenalog and 2 mL of Marcaine injected into the greater trochanter bursa. Fluid seen entering the bursa.   Completed without difficulty   Pain immediately resolved suggesting accurate placement of the medication.   Advised to call if fevers/chills, erythema, induration, drainage, or persistent bleeding.   Images permanently stored and available for review in the ultrasound unit.  Impression: Technically successful ultrasound guided injection.      EXAM: MR OF THE RIGHT HIP WITHOUT CONTRAST   TECHNIQUE: Multiplanar, multisequence MR imaging was performed. No intravenous contrast was administered.   COMPARISON:  Radiographs dated November 04, 2020   FINDINGS: Bone   No hip fracture, dislocation or avascular necrosis. No aggressive osseous lesion.   SI joints are normal. No SI joint widening or erosive changes.   Lower lumbar spine demonstrates no focal abnormality.   Alignment   Normal. No subluxation.   Dysplasia   None.   Joint effusion   No joint effusions.   Labrum   Chronic degenerative changes of the labrum without evidence of acute tear.   Cartilage   Generalized articular cartilage thinning without evidence of significant subchondral cystic changes or edema.   Capsule and ligaments   Normal.   Muscles and Tendons   Flexors: Normal.   Extensors: Normal.   Abductors: Tendinopathy and edema at the insertion of the gluteus minimus.   Adductors: Normal.   Rotators: Normal.   Hamstrings: Normal.   Other Findings  No bursal fluid.   Viscera   Large right uterine wall fibroid measuring at least 5 x 5 cm. Urinary bladder is unremarkable.   IMPRESSION: 1.  No evidence of fracture or subluxation.   2.  Mild hip osteoarthritis without evidence of joint effusion.   3.  Mild gluteus minimus tendinopathy.   4.  Large uterine fibroid measuring at least 5 x 5 cm.     Electronically Signed   By: Keane Police D.O.   On:  06/26/2021 22:13   I, [name], personally (independently) visualized and performed the interpretation of the images attached in this note.      Assessment and Plan: 71 y.o. female with right lateral hip pain thought to be due to gluteus minimus tendinitis and bursitis.  Plan for steroid injection today.  Continue work on home exercise program.  If not improving will proceed to shockwave therapy likely.  Alternatives include PRP injection.   PDMP not reviewed this encounter. Orders Placed This Encounter  Procedures   [place] LIMITED JOINT SPACE STRUCTURES LOW RIGHT(NO LINKED CHARGES)    Order Specific Question:   Reason for Exam (SYMPTOM  OR DIAGNOSIS REQUIRED)    Answer:   R hip pain    Order Specific Question:   Preferred imaging location?    Answer:   [place]   No orders of the defined types were placed in this encounter.    Discussed warning signs or symptoms. Please see discharge instructions. Patient expresses understanding.   The above documentation has been reviewed and is accurate and complete [name], M.D.

## 2021-07-05 ENCOUNTER — Encounter: Payer: Self-pay | Admitting: Family Medicine

## 2021-07-05 ENCOUNTER — Ambulatory Visit: Payer: Medicare Other | Admitting: Family Medicine

## 2021-07-05 DIAGNOSIS — M8580 Other specified disorders of bone density and structure, unspecified site: Secondary | ICD-10-CM

## 2021-07-05 DIAGNOSIS — I10 Essential (primary) hypertension: Secondary | ICD-10-CM | POA: Diagnosis not present

## 2021-07-05 DIAGNOSIS — E785 Hyperlipidemia, unspecified: Secondary | ICD-10-CM

## 2021-07-05 DIAGNOSIS — Z5181 Encounter for therapeutic drug level monitoring: Secondary | ICD-10-CM | POA: Diagnosis not present

## 2021-07-05 DIAGNOSIS — Z79899 Other long term (current) drug therapy: Secondary | ICD-10-CM | POA: Diagnosis not present

## 2021-07-05 DIAGNOSIS — F418 Other specified anxiety disorders: Secondary | ICD-10-CM

## 2021-07-05 DIAGNOSIS — K2100 Gastro-esophageal reflux disease with esophagitis, without bleeding: Secondary | ICD-10-CM

## 2021-07-05 DIAGNOSIS — G479 Sleep disorder, unspecified: Secondary | ICD-10-CM | POA: Diagnosis not present

## 2021-07-05 MED ORDER — ATORVASTATIN CALCIUM 20 MG PO TABS: 20.0000 mg | ORAL_TABLET | Freq: Every day | ORAL | 3 refills | Status: DC

## 2021-07-05 MED ORDER — TELMISARTAN 80 MG PO TABS: 80.0000 mg | ORAL_TABLET | Freq: Every day | ORAL | 1 refills | Status: DC

## 2021-07-05 MED ORDER — FLUOXETINE HCL 40 MG PO CAPS: 40.0000 mg | ORAL_CAPSULE | Freq: Every day | ORAL | 1 refills | Status: DC

## 2021-07-05 MED ORDER — ZOLPIDEM TARTRATE 5 MG PO TABS: 2.5000 mg | ORAL_TABLET | Freq: Every day | ORAL | 1 refills | Status: DC

## 2021-07-05 NOTE — Progress Notes (Signed)
Patient ID: Rebecca Long, female  DOB: August 27, 1950, 71 y.o.   MRN: [id number] Patient Care Team    Relationship Specialty Notifications Start End  Ma Hillock, DO PCP - General Family Medicine  10/06/14   Wallene Huh, [place] Consulting Physician Podiatry  10/18/15   Sydnee Levans, MD Consulting Physician Dermatology  10/18/15   Stefanie Libel, MD Consulting Physician Sports Medicine  11/01/15   Ladene Artist, MD Consulting Physician Gastroenterology  11/04/17   Rutherford Guys, MD Consulting Physician Ophthalmology  06/17/18   Princess Bruins, MD Consulting Physician Obstetrics and Gynecology  06/17/18   Eino Farber, PA-C Physician Assistant Psychiatry  06/17/18    Comment: [facility]    Chief Complaint  Patient presents with   Hypertension    [facility]; pt is not fasting    Subjective: Rebecca Long is a 71 y.o.  Female  present for [facility] Hypertension/hyperlipidemia/overweight:   Pt reports  compliance   with telmisartan 80 mg daily, Lipitor 20 mg daily and daily baby aspirin. Patient denies chest pain, shortness of breath, dizziness or lower extremity edema.  Pt endorses use of daily baby ASA. Pt is  prescribed statin. Exercise: exercise and pickle ball.  RF: HTN, HLD, Fhx Heart disease   Depression/anxiety/Sleep d/o:had seen psych in the past and transferred care to PCP 2022. She reports her depression/anxiety is well controlled on prozac 40 mg qd.  She also suffers from sleep disturbance and responds well to ambien 2.5-5 mg qhs prn.    GERD: Pt reports condition is stable with prilosec. Unable to wean off w.o return of symptoms.      07/05/2021    1:17 PM 03/29/2021   10:13 AM 11/29/2020    9:31 AM 03/23/2020    9:54 AM 01/06/2020    8:35 AM  Depression screen PHQ 2/9  Decreased Interest 2 0 1 0 0  Down, Depressed, Hopeless _0 0  PHQ - 2 Score _1 0  Altered sleeping 0 0 1    Tired, decreased energy 3 0 2    Change in appetite 3 0 2    Feeling  bad or failure about yourself  2 0 1    Trouble concentrating _2 Moving slowly or fidgety/restless 0 0 0    Suicidal thoughts 0 0 0    PHQ-9 Score _3 Difficult doing work/chores  Somewhat difficult         07/05/2021    1:18 PM 11/29/2020    9:31 AM 07/23/2019    8:46 AM 06/17/2018    1:55 PM  GAD 7 : Generalized Anxiety Score  Nervous, Anxious, on Edge _4 Control/stop worrying 1 1 0 3  Worry too much - different things 1 1 0 3  Trouble relaxing  1 0 3  Restless 0 1 0 0  Easily annoyed or irritable 2 1 0 3  Afraid - awful might happen 2 1 0 3  Total GAD 7 Score  _5 Anxiety Difficulty   Not difficult at all Somewhat difficult    Immunization History  Administered Date(s) Administered   Hepatitis A, Adult 11/30/2016   Influenza, High Dose Seasonal PF 10/18/2015, 11/12/2016, 10/18/2017, 09/15/2018, 10/11/2020   Influenza,inj,Quad PF,6+ Mos 10/15/2012, 10/06/2014   Influenza-Unspecified 10/15/2012, 10/14/2019   PFIZER(Purple Top)SARS-COV-2 Vaccination 02/19/2019, 03/12/2019, 10/12/2019, 04/27/2020   Pfizer Covid-19 Vaccine  Bivalent Booster 33yr & up 10/11/2020, 05/23/2021   Pneumococcal Conjugate-13 10/29/2014   Pneumococcal Polysaccharide-23 11/01/2015   Td 10/24/2004   Tdap 10/29/2014   Zoster Recombinat (Shingrix) 04/11/2017, 07/25/2017   Zoster, Live 01/30/2007     Past Medical History:  Diagnosis Date   Anxiety    Cavus foot, acquired 09/09/2012    bilateral cavus feet the one on the left shows that she has some probable neurogenic weakness with abnormal curvature and in inversion contracture Podiatry 06/2015: H&P and x-ray reviewed with patient. Today I went ahead and I did a proximal nerve block I was able to aspirate the second MPJ and got out amount of clear fluid and injected with a quarter cc dexamethasone Kenalog and ap   DDD (degenerative disc disease), lumbar    Depression    Endometrial polyp    GERD (gastroesophageal reflux disease)     Hiatal hernia    Hip flexor tendinitis, right 07/02/2016   History of adenomatous polyp of colon    History of esophageal stricture    History of gastric polyp 11/2017   History of primary hyperparathyroidism    s/p  left parathryoidectomy 09-17-2008-- resolved   Hyperlipidemia    Hypertension    Insomnia    Lumbar stenosis    Osteopenia    Schatzki's ring    Scoliosis    Tendinopathy of right biceps tendon 07/02/2016   Thickened endometrium    Uterine fibroid    UTI (urinary tract infection) due to Enterococcus 01/05/2020   Wears glasses    Allergies  Allergen Reactions   Latex Itching and Rash   Polysporin [Bacitracin-Polymyxin B] Rash   Past Surgical History:  Procedure Laterality Date   BREAST REDUCTION SURGERY Bilateral 2000   COLONOSCOPY     CYST EXCISION  1985   head and rt axilla   DILATATION & CURETTAGE/HYSTEROSCOPY WITH MYOSURE N/A 08/13/2018   Procedure: DILATATION & CURETTAGE/HYSTEROSCOPY WITH MYOSURE;  Surgeon: LPrincess Bruins MD;  [facility]  Service: Gynecology;  Laterality: N/A;  [place] [facility] [place]   DILATION AND CURETTAGE OF UTERUS N/A 08/13/2018   ECTOPIC PREGNANCY SURGERY  1981   PARATHYROIDECTOMY Left 09/17/2008   dr aRonnald Collum _0    left superior  (adenoma)   SHOULDER SURGERY Right 2007   "frozen"   UPPER GASTROINTESTINAL ENDOSCOPY  last one 12-18-2017   dr mRush Landmark  Family History  Problem Relation Age of Onset   Heart disease Father 621  Breast cancer Mother    Hypertension Brother    Hypertension Sister    Breast cancer Maternal Aunt    Colon cancer Neg Hx    Esophageal cancer Neg Hx    Stomach cancer Neg Hx    Rectal cancer Neg Hx    Colon polyps Neg Hx    Social History   Social History Narrative   Lives with husband. Feels safe at home. 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 [employer] college. Former smoker. Wears seat belt.     Allergies as of 07/05/2021        Reactions   Latex Itching, Rash   Polysporin [bacitracin-polymyxin B] Rash        Medication List        Accurate as of July 05, 2021  4:03 PM. If you have any questions, ask your nurse or doctor.          STOP taking these medications  Plenvu 140 g Solr Generic drug: PEG-KCl-NaCl-NaSulf-Na Asc-C Stopped by: Howard Pouch, DO       TAKE these medications    atorvastatin 20 MG tablet Commonly known as: LIPITOR Take 1 tablet (20 mg total) by mouth daily.   FLUoxetine 40 MG capsule Commonly known as: PROZAC Take 1 capsule (40 mg total) by mouth daily.   omeprazole 40 MG capsule Commonly known as: PRILOSEC Take 1 capsule (40 mg total) by mouth daily.   telmisartan 80 MG tablet Commonly known as: MICARDIS Take 1 tablet (80 mg total) by mouth daily.   valACYclovir 500 MG tablet Commonly known as: VALTREX Take 500 mg by mouth as needed.   Vitamin D3 10 MCG (400 UNIT) Caps SMARTSIG:1 Capsule(s) By Mouth   zolpidem 5 MG tablet Commonly known as: AMBIEN Take 0.5-1 tablets (2.5-5 mg total) by mouth at bedtime.        All past medical history, surgical history, allergies, family history, immunizations andmedications were updated in the EMR today and reviewed under the history and medication portions of their EMR.      ROS: 14 pt review of systems performed and negative (unless mentioned in an HPI)  Objective: BP 96/64   Pulse 74   Temp (!) 97.3 F (36.3 C) (Oral)   Ht _0  (1.575 m)   Wt 165 lb (74.8 kg)   SpO2 97%   BMI 30.18 kg/m  Physical Exam Vitals and nursing note reviewed.  Constitutional:      General: She is not in acute distress.    Appearance: Normal appearance. She is not ill-appearing, toxic-appearing or diaphoretic.  HENT:     Head: Normocephalic and atraumatic.  Eyes:     General: No scleral icterus.       Right eye: No discharge.        Left eye: No discharge.     Extraocular Movements: Extraocular movements intact.      Conjunctiva/sclera: Conjunctivae normal.     Pupils: Pupils are equal, round, and reactive to light.  Cardiovascular:     Rate and Rhythm: Normal rate and regular rhythm.  Pulmonary:     Effort: Pulmonary effort is normal. No respiratory distress.     Breath sounds: Normal breath sounds. No wheezing, rhonchi or rales.  Musculoskeletal:     Cervical back: Neck supple. No tenderness.     Right lower leg: No edema.     Left lower leg: No edema.  Lymphadenopathy:     Cervical: No cervical adenopathy.  Skin:    General: Skin is warm and dry.     Coloration: Skin is not jaundiced or pale.     Findings: No erythema or rash.  Neurological:     Mental Status: She is alert and oriented to person, place, and time. Mental status is at baseline.     Motor: No weakness.     Gait: Gait normal.  Psychiatric:        Mood and Affect: Mood normal.        Behavior: Behavior normal.        Thought Content: Thought content normal.        Judgment: Judgment normal.   No results found.  Assessment/plan: Rebecca Long is a 71 y.o. female present for [place]  Essential hypertension, benign/Hyperlipidemia, unspecified hyperlipidemia type/obesity Blood pressure has been low last 2 visits.  She is feeling more fatigued.   Decrease telmisartan 80>trial of a half a tab daily since pressures have been low  the last 2 office visits.  Patient will continue to monitor her blood pressure at home. - DC amlodipine 2.5 mg daily- has been controlled without.  - Continue exercising at least 150 minutes a week. Low-sodium diet. CBC, CMP, TSH and lipids collected today - F/U 5.5 months as long as doing well.   Sleep disturbacne:  Stable Discussed safety profile with her and the likelihood of possibly needing to try another medication to help her sleep in the future. Continue   Ambien 2.5-5 seems to be working well with her without negative side effects. [facility]S database reviewed 07/05/21 > Contract signed  11/29/2020 Follow-up face-to-face every 5.5 months needed for refills.   Depression with anxiety/insomnia  Stable Continue prozac 40  mg qd.   Gastroesophageal reflux disease with esophagitis without hemorrhage/long-term PPI therapy Continue to follow-up with Dr. Fuller Plan Continue  Prilosec 40 mg daily Avoid dietary triggers B12, mag and vitamin D collected today  Return in 24 weeks (on 12/20/2021) for Routine chronic condition follow-up.    Orders Placed This Encounter  Procedures   CBC   Comp Met (CMET)   TSH   Lipid panel   Vitamin D (25 hydroxy)   Magnesium   B12    Meds ordered this encounter  Medications   FLUoxetine (PROZAC) 40 MG capsule    Sig: Take 1 capsule (40 mg total) by mouth daily.    Dispense:  90 capsule    Refill:  1   telmisartan (MICARDIS) 80 MG tablet    Sig: Take 1 tablet (80 mg total) by mouth daily.    Dispense:  90 tablet    Refill:  1   atorvastatin (LIPITOR) 20 MG tablet    Sig: Take 1 tablet (20 mg total) by mouth daily.    Dispense:  90 tablet    Refill:  3   zolpidem (AMBIEN) 5 MG tablet    Sig: Take 0.5-1 tablets (2.5-5 mg total) by mouth at bedtime.    Dispense:  90 tablet    Refill:  1    Referral Orders  No referral(s) requested today     Electronically signed by: Howard Pouch, [place]

## 2021-07-06 ENCOUNTER — Other Ambulatory Visit: Payer: Self-pay | Admitting: Family Medicine

## 2021-07-06 ENCOUNTER — Telehealth: Payer: Self-pay | Admitting: Family Medicine

## 2021-07-06 LAB — CBC
HCT: 41.9 % (ref 35.0–45.0)
Hemoglobin: 14.2 g/dL (ref 11.7–15.5)
MCH: 30.1 pg (ref 27.0–33.0)
MCHC: 33.9 g/dL (ref 32.0–36.0)
MCV: 88.8 fL (ref 80.0–100.0)
MPV: 11.5 fL (ref 7.5–12.5)
Platelets: 340 10*3/uL (ref 140–400)
RBC: 4.72 10*6/uL (ref 3.80–5.10)
RDW: 13.3 % (ref 11.0–15.0)
WBC: 9.6 10*3/uL (ref 3.8–10.8)

## 2021-07-06 LAB — MAGNESIUM: Magnesium: 2 mg/dL (ref 1.5–2.5)

## 2021-07-06 LAB — LIPID PANEL
Cholesterol: 167 mg/dL (ref <200)
HDL: 59 mg/dL (ref >=50)
LDL Cholesterol (Calc): 82 mg/dL (calc)
Non-HDL Cholesterol (Calc): 108 mg/dL (calc) (ref <130)
Total CHOL/HDL Ratio: 2.8 (calc) (ref <5.0)
Triglycerides: 154 mg/dL — ABNORMAL HIGH (ref <150)

## 2021-07-06 LAB — COMPREHENSIVE METABOLIC PANEL
AG Ratio: 1.8 (calc) (ref 1.0–2.5)
ALT: 17 U/L (ref 6–29)
AST: 15 U/L (ref 10–35)
Albumin: 4.2 g/dL (ref 3.6–5.1)
Alkaline phosphatase (APISO): 55 U/L (ref 37–153)
BUN: 19 mg/dL (ref 7–25)
CO2: 23 mmol/L (ref 20–32)
Calcium: 9.9 mg/dL (ref 8.6–10.4)
Chloride: 101 mmol/L (ref 98–110)
Creat: 0.81 mg/dL (ref 0.60–1.00)
Globulin: 2.4 g/dL (calc) (ref 1.9–3.7)
Glucose, Bld: 107 mg/dL — ABNORMAL HIGH (ref 65–99)
Potassium: 4.2 mmol/L (ref 3.5–5.3)
Sodium: 136 mmol/L (ref 135–146)
Total Bilirubin: 1 mg/dL (ref 0.2–1.2)
Total Protein: 6.6 g/dL (ref 6.1–8.1)

## 2021-07-06 LAB — VITAMIN D 25 HYDROXY (VIT D DEFICIENCY, FRACTURES): Vit D, 25-Hydroxy: 36 ng/mL (ref 30–100)

## 2021-07-06 LAB — TSH: TSH: 1.84 mIU/L (ref 0.40–4.50)

## 2021-07-06 LAB — VITAMIN B12: Vitamin B-12: 284 pg/mL (ref 200–1100)

## 2021-07-06 MED ORDER — CYANOCOBALAMIN 1000 MCG/15ML PO LIQD: 1000.0000 ug | Freq: Every day | ORAL | 5 refills | Status: AC

## 2021-07-06 NOTE — Telephone Encounter (Signed)
B12 sublingual solution was prescribed to her today.  Received medication from pharmacy and is on backorder.  Please encourage patient to buy B12 sublingual solution over-the-counter as phone note states. If she desires B12 injections to boost her B12 into normal range, we can set her up for B12 injections every 2 weeks for 6 doses by nurse visit.  I would still encourage her to buy over-the-counter B12 sublingual solution 1000 mcg daily to start after B12 injections are completed to maintain the levels.  Thanks

## 2021-07-06 NOTE — Telephone Encounter (Signed)
Pt is informed.

## 2021-07-06 NOTE — Telephone Encounter (Signed)
Please call patient Liver, kidney and thyroid function are normal Blood cell counts and electrolytes are normal Cholesterol panel is at goal for nonfasting panel.  Her LDL/bad cholesterol is excellent at 82. Vitamin D levels are normal at 36. B12 levels are extremely low at 284.  I have called in B12 1000 mcg sublingual solution daily.  The solution is held under the tongue for at least 30 seconds and then swallowed normally.  She will need a sublingual version in order to absorb appropriately since she is not absorbing through her GI system.  If this medication is not covered by her insurance she can purchase over-the-counter relatively cheap.   -B12 is responsible for memory, energy and nerve health.  Ideally level should be at least above 500.

## 2021-07-06 NOTE — Telephone Encounter (Signed)
Spoke with pt regarding labs and instructions.   

## 2021-07-12 ENCOUNTER — Ambulatory Visit (INDEPENDENT_AMBULATORY_CARE_PROVIDER_SITE_OTHER): Payer: Medicare Other

## 2021-07-12 DIAGNOSIS — E538 Deficiency of other specified B group vitamins: Secondary | ICD-10-CM | POA: Diagnosis not present

## 2021-07-12 MED ORDER — CYANOCOBALAMIN 1000 MCG/ML IJ SOLN
1000.0000 ug | Freq: Once | INTRAMUSCULAR | Status: AC
  Administered 2021-07-12: 1000 ug via INTRAMUSCULAR

## 2021-07-12 NOTE — Progress Notes (Signed)
Pt here for monthly B12 injection per Dr. Kuneff   B12 1000mcg given IM, and pt tolerated injection well.   Next B12 injection scheduled for B12 injections every 2 weeks for 6 doses by nurse visit.  I would still encourage her to buy over-the-counter B12 sublingual solution 1000 mcg daily to start after B12 injections are completed to maintain the levels.   

## 2021-07-19 ENCOUNTER — Encounter: Payer: Self-pay | Admitting: Gastroenterology

## 2021-07-19 ENCOUNTER — Ambulatory Visit: Payer: Medicare Other

## 2021-07-26 ENCOUNTER — Ambulatory Visit (AMBULATORY_SURGERY_CENTER): Payer: Medicare Other | Admitting: Gastroenterology

## 2021-07-26 ENCOUNTER — Encounter: Payer: Self-pay | Admitting: Gastroenterology

## 2021-07-26 ENCOUNTER — Ambulatory Visit: Payer: Medicare Other

## 2021-07-26 VITALS — BP 112/58 | HR 69 | Temp 96.6°F | Resp 21 | Ht 62.0 in | Wt 165.0 lb

## 2021-07-26 DIAGNOSIS — D123 Benign neoplasm of transverse colon: Secondary | ICD-10-CM | POA: Diagnosis not present

## 2021-07-26 DIAGNOSIS — K5289 Other specified noninfective gastroenteritis and colitis: Secondary | ICD-10-CM

## 2021-07-26 DIAGNOSIS — K5730 Diverticulosis of large intestine without perforation or abscess without bleeding: Secondary | ICD-10-CM

## 2021-07-26 DIAGNOSIS — R194 Change in bowel habit: Secondary | ICD-10-CM | POA: Diagnosis not present

## 2021-07-26 DIAGNOSIS — R197 Diarrhea, unspecified: Secondary | ICD-10-CM

## 2021-07-26 DIAGNOSIS — Z86010 Personal history of colonic polyps: Secondary | ICD-10-CM

## 2021-07-26 DIAGNOSIS — D125 Benign neoplasm of sigmoid colon: Secondary | ICD-10-CM | POA: Diagnosis not present

## 2021-07-26 DIAGNOSIS — D122 Benign neoplasm of ascending colon: Secondary | ICD-10-CM | POA: Diagnosis not present

## 2021-07-26 MED ORDER — SODIUM CHLORIDE 0.9 % IV SOLN: 500.0000 mL | Freq: Once | INTRAVENOUS | Status: DC

## 2021-07-26 NOTE — Progress Notes (Signed)
Report to pacu rn; vss. Care resumed by pacu rn 

## 2021-07-26 NOTE — Patient Instructions (Signed)
YOU HAD AN ENDOSCOPIC PROCEDURE TODAY AT THE [facility]:   Refer to the procedure report that was given to you for any specific questions about what was found during the examination.  If the procedure report does not answer your questions, please call your gastroenterologist to clarify.  If you requested that your care partner not be given the details of your procedure findings, then the procedure report has been included in a sealed envelope for you to review at your convenience later.  YOU SHOULD EXPECT: Some feelings of bloating in the abdomen. Passage of more gas than usual.  Walking can help get rid of the air that was put into your GI tract during the procedure and reduce the bloating. If you had a lower endoscopy (such as a colonoscopy or flexible sigmoidoscopy) you may notice spotting of blood in your stool or on the toilet paper. If you underwent a bowel prep for your procedure, you may not have a normal bowel movement for a few days.  Please Note:  You might notice some irritation and congestion in your nose or some drainage.  This is from the oxygen used during your procedure.  There is no need for concern and it should clear up in a day or so.  SYMPTOMS TO REPORT IMMEDIATELY:  Following lower endoscopy (colonoscopy or flexible sigmoidoscopy):  Excessive amounts of blood in the stool  Significant tenderness or worsening of abdominal pains  Swelling of the abdomen that is new, acute  Fever of 100F or higher  Following upper endoscopy (EGD)  Vomiting of blood or coffee ground material  New chest pain or pain under the shoulder blades  Painful or persistently difficult swallowing  New shortness of breath  Fever of 100F or higher  Black, tarry-looking stools  For urgent or emergent issues, a gastroenterologist can be reached at any hour by calling [phone]. Do not use MyChart messaging for urgent concerns.    DIET:  We do recommend a small meal at first, but  then you may proceed to your regular diet.  Drink plenty of fluids but you should avoid alcoholic beverages for 24 hours.  ACTIVITY:  You should plan to take it easy for the rest of today and you should NOT DRIVE or use heavy machinery until tomorrow (because of the sedation medicines used during the test).    FOLLOW UP: Our staff will call the number listed on your records the next business day following your procedure.  We will call around 7:15- 8:00 am to check on you and address any questions or concerns that you may have regarding the information given to you following your procedure. If we do not reach you, we will leave a message.  If you develop any symptoms (ie: fever, flu-like symptoms, shortness of breath, cough etc.) before then, please call [phone].  If you test positive for Covid 19 in the 2 weeks post procedure, please call and report this information to us.    If any biopsies were taken you will be contacted by phone or by letter within the next 1-3 weeks.  Please call us at [phone] if you have not heard about the biopsies in 3 weeks.    SIGNATURES/CONFIDENTIALITY: You and/or your care partner have signed paperwork which will be entered into your electronic medical record.  These signatures attest to the fact that that the information above on your After Visit Summary has been reviewed and is understood.  Full responsibility of the confidentiality   of this discharge information lies with you and/or your care-partner.  

## 2021-07-26 NOTE — Progress Notes (Signed)
Called to room to assist during endoscopic procedure.  Patient ID and intended procedure confirmed with present staff. Received instructions for my participation in the procedure from the performing physician.  

## 2021-07-26 NOTE — Op Note (Signed)
[facility] Patient Name: Rebecca Long Procedure Date: 07/26/2021 12:16 PM MRN: [id number] Endoscopist: Ladene Artist , MD Age: 71 Referring MD:  Date of Birth: Jan 22, 1951 Gender: Female Account #: [id number] Procedure:                Colonoscopy Indications:              Change in bowel habits, Clinically significant                            diarrhea of unexplained origin, Personal history of                            adenomatous colon polyps Medicines:                Monitored Anesthesia Care Procedure:                Pre-Anesthesia Assessment:                           - Prior to the procedure, a History and Physical                            was performed, and patient medications and                            allergies were reviewed. The patient's tolerance of                            previous anesthesia was also reviewed. The risks                            and benefits of the procedure and the sedation                            options and risks were discussed with the patient.                            All questions were answered, and informed consent                            was obtained. Prior Anticoagulants: The patient has                            taken no previous anticoagulant or antiplatelet                            agents. ASA Grade Assessment: II - A patient with                            mild systemic disease. After reviewing the risks                            and benefits, the patient was deemed in  satisfactory condition to undergo the procedure.                           After obtaining informed consent, the colonoscope                            was passed under direct vision. Throughout the                            procedure, the patient's blood pressure, pulse, and                            oxygen saturations were monitored continuously. The                            PCF-HQ190L Colonoscope was introduced  through the                            anus and advanced to the the terminal ileum, with                            identification of the appendiceal orifice and IC                            valve. The terminal ileum, ileocecal valve,                            appendiceal orifice, and rectum were photographed.                            The quality of the bowel preparation was good. The                            colonoscopy was performed without difficulty. The                            patient tolerated the procedure well. Scope In: 1:12:44 PM Scope Out: 1:35:04 PM Scope Withdrawal Time: 0 hours 16 minutes 24 seconds  Total Procedure Duration: 0 hours 22 minutes 20 seconds  Findings:                 The perianal and digital rectal examinations were                            normal.                           The terminal ileum appeared normal.                           A 3 mm polyp was found in the transverse colon. The                            polyp was sessile. The polyp was removed with a  cold biopsy forceps. Resection and retrieval were                            complete.                           Four sessile polyps were found in the sigmoid colon                            (1), transverse colon (1) and ascending colon (2).                            The polyps were 6 to 8 mm in size. These polyps                            were removed with a cold snare. Resection and                            retrieval were complete.                           Multiple small-mouthed diverticula were found in                            the left colon. There was no evidence of                            diverticular bleeding.                           The exam was otherwise without abnormality on                            direct and retroflexion views. Random biospies                            taken throughout. Complications:            No immediate  complications. Estimated blood loss:                            None. Estimated Blood Loss:     Estimated blood loss: none. Impression:               - The examined portion of the ileum was normal.                           - One 3 mm polyp in the transverse colon, removed                            with a cold biopsy forceps. Resected and retrieved.                           - Four 6 to 8 mm polyps in the sigmoid colon, in  the transverse colon and in the ascending colon,                            removed with a cold snare. Resected and retrieved.                           - Mild diverticulosis in the left colon.                           - The examination was otherwise normal on direct                            and retroflexion views. Random biopsies taken. Recommendation:           - Repeat colonoscopy after studies are complete for                            surveillance based on pathology results.                           - Patient has a contact number available for                            emergencies. The signs and symptoms of potential                            delayed complications were discussed with the                            patient. Return to normal activities tomorrow.                            Written discharge instructions were provided to the                            patient.                           - Resume previous diet.                           - Continue present medications.                           - Await pathology results.                           - If random biopsies are negative proceed with GI                            stool profile, tTG, IgA. Ladene Artist, MD 07/26/2021 1:44:02 PM This report has been signed electronically.

## 2021-07-26 NOTE — Progress Notes (Signed)
History & Physical  Primary Care Physician:  Ma Hillock, DO Primary Gastroenterologist: Lucio Edward, MD  CHIEF COMPLAINT: Change in bowel habits, diarrhea, personal history of colon polyps   HPI: Rebecca Long is a 71 y.o. female with a change in bowel habits with frequent loose stools occasional incontinence and occasional constipation.  Personal history of adenomatous colon polyps.   Past Medical History:  Diagnosis Date   Anxiety    Cavus foot, acquired 09/09/2012    bilateral cavus feet the one on the left shows that she has some probable neurogenic weakness with abnormal curvature and in inversion contracture Podiatry 06/2015: H&P and x-ray reviewed with patient. Today I went ahead and I did a proximal nerve block I was able to aspirate the second MPJ and got out amount of clear fluid and injected with a quarter cc dexamethasone Kenalog and ap   DDD (degenerative disc disease), lumbar    Depression    Endometrial polyp    GERD (gastroesophageal reflux disease)    Hiatal hernia    Hip flexor tendinitis, right 07/02/2016   History of adenomatous polyp of colon    History of esophageal stricture    History of gastric polyp 11/2017   History of primary hyperparathyroidism    s/p  left parathryoidectomy 09-17-2008-- resolved   Hyperlipidemia    Hypertension    Insomnia    Lumbar stenosis    Osteopenia    Schatzki's ring    Scoliosis    Tendinopathy of right biceps tendon 07/02/2016   Thickened endometrium    Uterine fibroid    UTI (urinary tract infection) due to Enterococcus 01/05/2020   Wears glasses     Past Surgical History:  Procedure Laterality Date   BREAST REDUCTION SURGERY Bilateral 2000   COLONOSCOPY     CYST EXCISION  1985   head and rt axilla   DILATATION & CURETTAGE/HYSTEROSCOPY WITH MYOSURE N/A 08/13/2018   Procedure: [place];  Surgeon: Princess Bruins, MD;  Location: [facility];  Service:  Gynecology;  Laterality: N/A;  requests 10:15am in [place] Gyn block request 30 minutes   DILATION AND CURETTAGE OF UTERUS N/A 08/13/2018   ECTOPIC PREGNANCY SURGERY  1981   PARATHYROIDECTOMY Left 09/17/2008   dr Ronnald Collum  '@[facility]'$    left superior  (adenoma)   SHOULDER SURGERY Right 2007   "frozen"   UPPER GASTROINTESTINAL ENDOSCOPY  last one 12-18-2017   dr Rush Landmark    Prior to Admission medications   Medication Sig Start Date End Date Taking? Authorizing Provider  atorvastatin (LIPITOR) 20 MG tablet Take 1 tablet (20 mg total) by mouth daily. 07/05/21  Yes Kuneff, Renee A, DO  Cholecalciferol (VITAMIN D3) 10 MCG (400 UNIT) CAPS SMARTSIG:1 Capsule(s) By Mouth 01/04/21  Yes [provider]  Cyanocobalamin 1000 MCG/15ML LIQD Take 15 mLs (1,000 mcg total) by mouth daily. 07/06/21 10/04/21 Yes Kuneff, Renee A, DO  FLUoxetine (PROZAC) 40 MG capsule Take 1 capsule (40 mg total) by mouth daily. 07/05/21  Yes Kuneff, Renee A, DO  omeprazole (PRILOSEC) 40 MG capsule Take 1 capsule (40 mg total) by mouth daily. 11/29/20  Yes Kuneff, Renee A, DO  telmisartan (MICARDIS) 80 MG tablet Take 1 tablet (80 mg total) by mouth daily. 07/05/21  Yes Kuneff, Renee A, DO  zolpidem (AMBIEN) 5 MG tablet Take 0.5-1 tablets (2.5-5 mg total) by mouth at bedtime. 07/05/21  Yes Kuneff, Renee A, DO  valACYclovir (VALTREX) 500 MG tablet Take  500 mg by mouth as needed.     [provider]    Current Outpatient Medications  Medication Sig Dispense Refill   atorvastatin (LIPITOR) 20 MG tablet Take 1 tablet (20 mg total) by mouth daily. 90 tablet 3   Cholecalciferol (VITAMIN D3) 10 MCG (400 UNIT) CAPS SMARTSIG:1 Capsule(s) By Mouth     Cyanocobalamin 1000 MCG/15ML LIQD Take 15 mLs (1,000 mcg total) by mouth daily. 480 mL 5   FLUoxetine (PROZAC) 40 MG capsule Take 1 capsule (40 mg total) by mouth daily. 90 capsule 1   omeprazole (PRILOSEC) 40 MG capsule Take 1 capsule (40 mg total) by mouth daily. 90 capsule 3    telmisartan (MICARDIS) 80 MG tablet Take 1 tablet (80 mg total) by mouth daily. 90 tablet 1   zolpidem (AMBIEN) 5 MG tablet Take 0.5-1 tablets (2.5-5 mg total) by mouth at bedtime. 90 tablet 1   valACYclovir (VALTREX) 500 MG tablet Take 500 mg by mouth as needed.      Current Facility-Administered Medications  Medication Dose Route Frequency Provider Last Rate Last Admin   0.9 %  sodium chloride infusion  500 mL Intravenous Once Ladene Artist, MD        Allergies as of 07/26/2021 - Review Complete 07/26/2021  Allergen Reaction Noted   Latex Itching and Rash 08/14/2013   Polysporin [bacitracin-polymyxin b] Rash 09/09/2012    Family History  Problem Relation Age of Onset   Heart disease Father 68   Breast cancer Mother    Hypertension Brother    Hypertension Sister    Breast cancer Maternal Aunt    Colon cancer Neg Hx    Esophageal cancer Neg Hx    Stomach cancer Neg Hx    Rectal cancer Neg Hx    Colon polyps Neg Hx     Social History   Socioeconomic History   Marital status: Married    Spouse name: Not on file   Number of children: 1   Years of education: Not on file   Highest education level: Not on file  Occupational History   Not on file  Tobacco Use   Smoking status: Former    Years: 15.00    Types: Cigarettes    Quit date: 01/30/1991    Years since quitting: 30.5   Smokeless tobacco: Never  Vaping Use   Vaping Use: Never used  Substance and Sexual Activity   Alcohol use: Not Currently   Drug use: Never   Sexual activity: Not Currently    Birth control/protection: Post-menopausal    Comment: 1st intercourse- 17, partners- 11, married- 77 yrs   Other Topics Concern   Not on file  Social History Narrative   Lives with husband. Feels safe at home. 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 [employer]. Some college. Former smoker. Wears seat belt.    Social Determinants of Health   Financial Resource Strain: Low Risk  (03/29/2021)   Overall Financial Resource  Strain (CARDIA)    Difficulty of Paying Living Expenses: Not hard at all  Food Insecurity: No Food Insecurity (03/29/2021)   Hunger Vital Sign    Worried About Running Out of Food in the Last Year: Never true    Ran Out of Food in the Last Year: Never true  Transportation Needs: No Transportation Needs (03/29/2021)   PRAPARE - Hydrologist (Medical): No    Lack of Transportation (Non-Medical): No  Physical Activity: Sufficiently Active (03/29/2021)   Exercise Vital  Sign    Days of Exercise per Week: 3 days    Minutes of Exercise per Session: 60 min  Stress: No Stress Concern Present (03/29/2021)   [place]    Feeling of Stress : Only a little  Social Connections: Moderately Integrated (03/29/2021)   Social Connection and Isolation Panel [NHANES]    Frequency of Communication with Friends and Family: More than three times a week    Frequency of Social Gatherings with Friends and Family: More than three times a week    Attends Religious Services: Never    Marine scientist or Organizations: Yes    Attends Archivist Meetings: 1 to 4 times per year    Marital Status: Married  Human resources officer Violence: Not At Risk (03/29/2021)   Humiliation, Afraid, Rape, and Kick questionnaire    Fear of Current or Ex-Partner: No    Emotionally Abused: No    Physically Abused: No    Sexually Abused: No    Review of Systems:  All systems reviewed an negative except where noted in HPI.  Gen: Denies any fever, chills, sweats, anorexia, fatigue, weakness, malaise, weight loss, and sleep disorder CV: Denies chest pain, angina, palpitations, syncope, orthopnea, PND, peripheral edema, and claudication. Resp: Denies dyspnea at rest, dyspnea with exercise, cough, sputum, wheezing, coughing up blood, and pleurisy. GI: Denies vomiting blood, jaundice, and fecal incontinence.   Denies dysphagia or  odynophagia. GU : Denies urinary burning, blood in urine, urinary frequency, urinary hesitancy, nocturnal urination, and urinary incontinence. MS: Denies joint pain, limitation of movement, and swelling, stiffness, low back pain, extremity pain. Denies muscle weakness, cramps, atrophy.  Derm: Denies rash, itching, dry skin, hives, moles, warts, or unhealing ulcers.  Psych: Denies depression, anxiety, memory loss, suicidal ideation, hallucinations, paranoia, and confusion. Heme: Denies bruising, bleeding, and enlarged lymph nodes. Neuro:  Denies any headaches, dizziness, paresthesias. Endo:  Denies any problems with DM, thyroid, adrenal function.   Physical Exam: General:  Alert, well-developed, in NAD Head:  Normocephalic and atraumatic. Eyes:  Sclera clear, no icterus.   Conjunctiva pink. Ears:  Normal auditory acuity. Mouth:  No deformity or lesions.  Neck:  Supple; no masses . Lungs:  Clear throughout to auscultation.   No wheezes, crackles, or rhonchi. No acute distress. Heart:  Regular rate and rhythm; no murmurs. Abdomen:  Soft, nondistended, nontender. No masses, hepatomegaly. No obvious masses.  Normal bowel .    Rectal:  Deferred   Msk:  Symmetrical without gross deformities.. Pulses:  Normal pulses noted. Extremities:  Without edema. Neurologic:  Alert and  oriented x4;  grossly normal neurologically. Skin:  Intact without significant lesions or rashes. Cervical Nodes:  No significant cervical adenopathy. Psych:  Alert and cooperative. Normal mood and affect.  Impression / Plan:   Change in bowel habits with frequent loose stools occasional incontinence and occasional constipation.  Personal history of adenomatous colon polyps.  Pricilla Riffle. Fuller Plan  07/26/2021, 1:09 PM See Shea Evans, [facility], to contact our on call provider

## 2021-07-27 ENCOUNTER — Ambulatory Visit (INDEPENDENT_AMBULATORY_CARE_PROVIDER_SITE_OTHER): Payer: Medicare Other

## 2021-07-27 ENCOUNTER — Telehealth: Payer: Self-pay

## 2021-07-27 DIAGNOSIS — E538 Deficiency of other specified B group vitamins: Secondary | ICD-10-CM | POA: Diagnosis not present

## 2021-07-27 MED ORDER — CYANOCOBALAMIN 1000 MCG/ML IJ SOLN
1000.0000 ug | INTRAMUSCULAR | Status: AC
  Administered 2021-08-09 – 2021-09-18 (×4): 1000 ug via INTRAMUSCULAR

## 2021-07-27 MED ORDER — CYANOCOBALAMIN 1000 MCG/ML IJ SOLN
1000.0000 ug | Freq: Once | INTRAMUSCULAR | Status: AC
  Administered 2021-07-27: 1000 ug via INTRAMUSCULAR

## 2021-07-27 NOTE — Telephone Encounter (Signed)
Left message on follow up call. 

## 2021-07-27 NOTE — Progress Notes (Signed)
Pt here for monthly B12 injection per Dr. Raoul Pitch   B12 102mg given IM, and pt tolerated injection well.   Next B12 injection scheduled for B12 injections every 2 weeks for 6 doses by nurse visit.  I would still encourage her to buy over-the-counter B12 sublingual solution 1000 mcg daily to start after B12 injections are completed to maintain the levels.

## 2021-08-09 ENCOUNTER — Telehealth: Payer: Self-pay | Admitting: Gastroenterology

## 2021-08-09 ENCOUNTER — Other Ambulatory Visit: Payer: Self-pay

## 2021-08-09 ENCOUNTER — Ambulatory Visit (INDEPENDENT_AMBULATORY_CARE_PROVIDER_SITE_OTHER): Payer: Medicare Other

## 2021-08-09 ENCOUNTER — Encounter: Payer: Self-pay | Admitting: Gastroenterology

## 2021-08-09 DIAGNOSIS — E538 Deficiency of other specified B group vitamins: Secondary | ICD-10-CM | POA: Diagnosis not present

## 2021-08-09 DIAGNOSIS — R197 Diarrhea, unspecified: Secondary | ICD-10-CM

## 2021-08-09 NOTE — Telephone Encounter (Signed)
Patient called in stating she had her colonoscopy on 07/26/21, and for the last 3 days she has been experiencing diarrhea again for most of the day. She says it is hard to tell, but it does look like there may be blood in her stool. She has no complaints of any other symptoms. Per procedure report, depending on path results patient may need further testing. Will route to MD for recommendations.

## 2021-08-09 NOTE — Progress Notes (Signed)
Pt here for biweekly B12 injection per Dr. Raoul Pitch. (3/6 doses)   B12 1035mg given IM, and pt tolerated injection well.   Next B12 injection scheduled for B12 injections every 2 weeks for 6 doses by nurse visit.  I would still encourage her to buy over-the-counter B12 sublingual solution 1000 mcg daily to start after B12 injections are completed to maintain the levels.

## 2021-08-09 NOTE — Telephone Encounter (Signed)
The polyps removed from your colon were benign, however they were precancerous. This means that they had the potential to change into cancer over time had they not been removed.  The biopsies obtained to evaluate diarrhea were normal.  Repeat colonoscopy in 3 years to determine if you have developed any new polyps and to screen for colorectal cancer.   GI stool profile, tTG, IgA Begin Imodium 1-2 po bid prn diarrhea after obtaining stool studies

## 2021-08-09 NOTE — Telephone Encounter (Signed)
Left message for patient to call back  

## 2021-08-09 NOTE — Telephone Encounter (Signed)
Inbound call from patient stating that she had a colonoscopy on 6/28 and her diarrhea has come back. Patient stated that everything had come back normal but is seeking advice on what she should do. Please advise.

## 2021-08-10 NOTE — Telephone Encounter (Signed)
Spoke with patient regarding MD recommendations. She states she is going out of town next Wednesday, but would try to have stool sample & labs collected before then. She has been advised on when/where to go for labs, and to call back with any questions.

## 2021-08-11 ENCOUNTER — Emergency Department (HOSPITAL_BASED_OUTPATIENT_CLINIC_OR_DEPARTMENT_OTHER): Payer: Medicare Other | Admitting: Radiology

## 2021-08-11 ENCOUNTER — Encounter (HOSPITAL_BASED_OUTPATIENT_CLINIC_OR_DEPARTMENT_OTHER): Payer: Self-pay | Admitting: Obstetrics and Gynecology

## 2021-08-11 ENCOUNTER — Other Ambulatory Visit: Payer: Self-pay

## 2021-08-11 ENCOUNTER — Emergency Department (HOSPITAL_BASED_OUTPATIENT_CLINIC_OR_DEPARTMENT_OTHER)
Admission: EM | Admit: 2021-08-11 | Discharge: 2021-08-11 | Disposition: A | Discharge status: Home / Self Care | Payer: Medicare Other | Attending: Emergency Medicine | Admitting: Emergency Medicine

## 2021-08-11 DIAGNOSIS — Z859 Personal history of malignant neoplasm, unspecified: Secondary | ICD-10-CM | POA: Insufficient documentation

## 2021-08-11 DIAGNOSIS — Z79899 Other long term (current) drug therapy: Secondary | ICD-10-CM | POA: Diagnosis not present

## 2021-08-11 DIAGNOSIS — Z91040 Latex allergy status: Secondary | ICD-10-CM | POA: Diagnosis not present

## 2021-08-11 DIAGNOSIS — R112 Nausea with vomiting, unspecified: Secondary | ICD-10-CM | POA: Insufficient documentation

## 2021-08-11 DIAGNOSIS — I10 Essential (primary) hypertension: Secondary | ICD-10-CM | POA: Diagnosis not present

## 2021-08-11 DIAGNOSIS — R0789 Other chest pain: Secondary | ICD-10-CM | POA: Diagnosis not present

## 2021-08-11 DIAGNOSIS — M545 Low back pain, unspecified: Secondary | ICD-10-CM | POA: Diagnosis not present

## 2021-08-11 DIAGNOSIS — M549 Dorsalgia, unspecified: Secondary | ICD-10-CM

## 2021-08-11 DIAGNOSIS — R079 Chest pain, unspecified: Secondary | ICD-10-CM | POA: Diagnosis not present

## 2021-08-11 LAB — BASIC METABOLIC PANEL
Anion gap: 10 (ref 5–15)
BUN: 14 mg/dL (ref 8–23)
CO2: 23 mmol/L (ref 22–32)
Calcium: 9.7 mg/dL (ref 8.9–10.3)
Chloride: 104 mmol/L (ref 98–111)
Creatinine, Ser: 0.7 mg/dL (ref 0.44–1.00)
GFR, Estimated: >60 mL/min (ref >60)
Glucose, Bld: 99 mg/dL (ref 70–99)
Potassium: 4.8 mmol/L (ref 3.5–5.1)
Sodium: 137 mmol/L (ref 135–145)

## 2021-08-11 LAB — TROPONIN I (HIGH SENSITIVITY)
Troponin I (High Sensitivity): 3 ng/L (ref <18)
Troponin I (High Sensitivity): 3 ng/L (ref <18)

## 2021-08-11 LAB — CBC
HCT: 42.1 % (ref 36.0–46.0)
Hemoglobin: 13.8 g/dL (ref 12.0–15.0)
MCH: 29.4 pg (ref 26.0–34.0)
MCHC: 32.8 g/dL (ref 30.0–36.0)
MCV: 89.6 fL (ref 80.0–100.0)
Platelets: 310 10*3/uL (ref 150–400)
RBC: 4.7 MIL/uL (ref 3.87–5.11)
RDW: 13.7 % (ref 11.5–15.5)
WBC: 7.7 10*3/uL (ref 4.0–10.5)
nRBC: 0 % (ref 0.0–0.2)

## 2021-08-11 MED ORDER — OXYCODONE-ACETAMINOPHEN 5-325 MG PO TABS
1.0000 | ORAL_TABLET | Freq: Once | ORAL | Status: AC
  Administered 2021-08-11: 1 via ORAL
  Filled 2021-08-11: qty 1

## 2021-08-11 MED ORDER — METHOCARBAMOL 500 MG PO TABS: 500.0000 mg | ORAL_TABLET | Freq: Two times a day (BID) | ORAL | 0 refills | Status: DC

## 2021-08-11 NOTE — ED Provider Notes (Signed)
I provided a substantive portion of the care of this patient.  I personally performed the entirety of the medical decision making for this encounter.  EKG Interpretation  Date/Time:  Friday August 11 2021 12:31:42 EDT Ventricular Rate:  68 PR Interval:  144 QRS Duration: 74 QT Interval:  394 QTC Calculation: 418 R Axis:   18 Text Interpretation: Normal sinus rhythm Cannot rule out Anterior infarct (cited on or before 11-Aug-2018) Abnormal ECG When compared with ECG of 11-Aug-2018 10:30, No significant change was found Confirmed by Lacretia Leigh ([id number]) on 08/11/2021 1:09:36 PM    71 year old female presents with back pain that goes to her chest.  Says has been for the last 3 days.  Somewhat positional.  EKG per my interpretation shows no acute ischemic changes.  Chest x-ray per my interpretation showed no active pulmonary disease.  Patient will have 2 troponins and if negative will be discharged home   Lacretia Leigh, MD 08/11/21 1456

## 2021-08-11 NOTE — ED Triage Notes (Signed)
Patient reports to the ER for left sided chest pain that goes into her back and along her shoulders. Patient reports it started x3 days ago. States her brother recently died of a heart attack

## 2021-08-11 NOTE — Discharge Instructions (Addendum)
Your work-up today was quite reassuring Please follow-up with your primary care provider.  Make sure that you take all of your prescribed occasions as prescribed.  I have given you the information for a cardiologist office to follow-up at if you wish although your symptoms certainly sound more muscular.   I have given you a medication called Robaxin you may take this as needed for pain although I would recommend starting out with Tylenol 1000 mg every 6 hours.  Warm compresses and gentle massage of this area can help as well.  Please return immediately to the emergency room should you experience any new or concerning symptoms.  You may also try Lidoderm patches/lidocaine patches as these can often be helpful as well.

## 2021-08-11 NOTE — ED Provider Notes (Signed)
[facility] EMERGENCY DEPT Provider Note   CSN: [id number] Arrival date & time: 08/11/21  1225     History  Chief Complaint  Patient presents with   Chest Pain    [name] is a 71 y.o. female.   Chest Pain Patient is a 71 year old female with past medical history significant for HTN, obesity, HLD, GERD, anxiety  Patient states for the past 3 to 4 days she has had some intermittent pains in her back but seems to radiate to her chest.  Seems to be going along her left shoulder and underneath her left breast.  Does not seem to get worse when she exerts herself.  She denies any pleuritic pain no hemoptysis lightheadedness or dizziness no syncopal episodes or near syncopal episodes.  She states that her older brother who is in his 42s passed away recently from a heart attack which made her more concerned about her symptoms.  She denies any associated nausea vomiting or sweating.  She states she does feel occasionally somewhat short of breath but does not equate this with being related to her chest discomfort.  She denies any radiation of pain down her arms.  She denies any ripping or tearing sensation.  Denies any numbness or weakness in arms or legs.  Denies any pain that extends into her abdomen or pelvis.  No recent surgeries, hospitalization, long travel, hemoptysis, estrogen containing OCP, cancer history.  No unilateral leg swelling.  No history of PE or VTE.      Home Medications Prior to Admission medications   Medication Sig Start Date End Date Taking? Authorizing Provider  atorvastatin (LIPITOR) 20 MG tablet Take 1 tablet (20 mg total) by mouth daily. 07/05/21   Kuneff, Renee A, DO  Cholecalciferol (VITAMIN D3) 10 MCG (400 UNIT) CAPS SMARTSIG:1 Capsule(s) By Mouth 01/04/21   [provider]  Cyanocobalamin 1000 MCG/15ML LIQD Take 15 mLs (1,000 mcg total) by mouth daily. 07/06/21 10/04/21  Kuneff, Renee A, DO  FLUoxetine (PROZAC) 40 MG capsule Take 1  capsule (40 mg total) by mouth daily. 07/05/21   Kuneff, Renee A, DO  omeprazole (PRILOSEC) 40 MG capsule Take 1 capsule (40 mg total) by mouth daily. 11/29/20   Kuneff, Renee A, DO  telmisartan (MICARDIS) 80 MG tablet Take 1 tablet (80 mg total) by mouth daily. 07/05/21   Kuneff, Renee A, DO  valACYclovir (VALTREX) 500 MG tablet Take 500 mg by mouth as needed.     [provider]  zolpidem (AMBIEN) 5 MG tablet Take 0.5-1 tablets (2.5-5 mg total) by mouth at bedtime. 07/05/21   Kuneff, Renee A, DO      Allergies    Latex and Polysporin [bacitracin-polymyxin b]    Review of Systems   Review of Systems  Cardiovascular:  Positive for chest pain.    Physical Exam Updated Vital Signs BP 120/66   Pulse (!) 58   Temp 98 F (36.7 C)   Resp 17   Ht '5\' 2"'$  (1.575 m)   Wt 74.4 kg   SpO2 95%   BMI 30.00 kg/m  Physical Exam Vitals and nursing note reviewed.  Constitutional:      General: She is not in acute distress. HENT:     Head: Normocephalic and atraumatic.     Nose: Nose normal.     Mouth/Throat:     Mouth: Mucous membranes are moist.  Eyes:     General: No scleral icterus. Cardiovascular:     Rate and Rhythm: Normal  rate and regular rhythm.     Pulses: Normal pulses.     Heart sounds: Normal heart sounds.     Comments: Bilateral radial artery pulses 2+ and symmetric Pulmonary:     Effort: Pulmonary effort is normal. No respiratory distress.     Breath sounds: No wheezing.  Abdominal:     Palpations: Abdomen is soft.     Tenderness: There is no abdominal tenderness. There is no guarding or rebound.  Musculoskeletal:     Cervical back: Normal range of motion.     Right lower leg: No edema.     Left lower leg: No edema.     Comments: No lower extremity edema.  No calf tenderness  Skin:    General: Skin is warm and dry.     Capillary Refill: Capillary refill takes less than 2 seconds.  Neurological:     Mental Status: She is alert. Mental status is at baseline.   Psychiatric:        Mood and Affect: Mood normal.        Behavior: Behavior normal.     ED Results / Procedures / Treatments   Labs (all labs ordered are listed, but only abnormal results are displayed) Labs Reviewed  BASIC METABOLIC PANEL  CBC  TROPONIN I (HIGH SENSITIVITY)  TROPONIN I (HIGH SENSITIVITY)    EKG EKG Interpretation  Date/Time:  Friday August 11 2021 12:31:42 EDT Ventricular Rate:  68 PR Interval:  144 QRS Duration: 74 QT Interval:  394 QTC Calculation: 418 R Axis:   18 Text Interpretation: Normal sinus rhythm Cannot rule out Anterior infarct (cited on or before 11-Aug-2018) Abnormal ECG When compared with ECG of 11-Aug-2018 10:30, No significant change was found Confirmed by Lacretia Leigh ([id number]) on 08/11/2021 1:09:58 PM  Radiology DG Chest 2 View  Result Date: 08/11/2021 CLINICAL DATA:  Chest pain EXAM: CHEST - 2 VIEW COMPARISON:  09/14/2008 FINDINGS: Cardiac size is within normal limits. Lung fields are clear of any infiltrate or pulmonary edema. There is no pleural effusion or pneumothorax. Dextroscoliosis is seen in thoracic spine. IMPRESSION: No active cardiopulmonary disease. Electronically Signed   By: Elmer Picker M.D.   On: 08/11/2021 13:20    Procedures Procedures    Medications Ordered in ED Medications  oxyCODONE-acetaminophen (PERCOCET/ROXICET) 5-325 MG per tablet 1 tablet (1 tablet Oral Given 08/11/21 1313)    ED Course/ Medical Decision Making/ A&P                           Medical Decision Making Amount and/or Complexity of Data Reviewed Labs: ordered. Radiology: ordered.   This patient presents to the ED for concern of back/chest pain, this involves a number of treatment options, and is a complaint that carries with it a moderate-to-high risk of complications and morbidity.  The differential diagnosis includes most likely muscular etiology.   The emergent causes of chest pain include: Acute coronary syndrome, tamponade,  pericarditis/myocarditis, aortic dissection, pulmonary embolism, tension pneumothorax, pneumonia, and esophageal rupture.   I do not believe the patient has an emergent cause of chest pain, other urgent/non-acute considerations include, but are not limited to: chronic angina, aortic stenosis, cardiomyopathy, mitral valve prolapse, pulmonary hypertension, aortic insufficiency, right ventricular hypertrophy, pleuritis, bronchitis, pneumothorax, tumor, gastroesophageal reflux disease (GERD), esophageal spasm, Mallory-Weiss syndrome, peptic ulcer disease, pancreatitis, functional gastrointestinal pain, cervical or thoracic disk disease or arthritis, shoulder arthritis, costochondritis, subacromial bursitis, anxiety or panic attack, herpes zoster, breast disorders,  chest wall tumors, thoracic outlet syndrome, mediastinitis.    Co morbidities: Discussed in HPI   Brief History:  Patient is a 71 year old female with past medical history significant for HTN, obesity, HLD, GERD, anxiety  Patient states for the past 3 to 4 days she has had some intermittent pains in her back but seems to radiate to her chest.  Seems to be going along her left shoulder and underneath her left breast.  Does not seem to get worse when she exerts herself.  She denies any pleuritic pain no hemoptysis lightheadedness or dizziness no syncopal episodes or near syncopal episodes.  She states that her older brother who is in his 9s passed away recently from a heart attack which made her more concerned about her symptoms.  She denies any associated nausea vomiting or sweating.  She states she does feel occasionally somewhat short of breath but does not equate this with being related to her chest discomfort.  She denies any radiation of pain down her arms.  She denies any ripping or tearing sensation.  Denies any numbness or weakness in arms or legs.  Denies any pain that extends into her abdomen or pelvis.  No recent surgeries,  hospitalization, long travel, hemoptysis, estrogen containing OCP, cancer history.  No unilateral leg swelling.  No history of PE or VTE.     EMR reviewed including pt PMHx, past surgical history and past visits to ER.   See HPI for more details   Lab Tests:   I ordered and independently interpreted labs. Labs notable for no leukocytosis or anemia, chemistry is WNLs.  Trop x1 WNLs    Imaging Studies:  NAD. I personally reviewed all imaging studies and no acute abnormality found. I agree with radiology interpretation.  IMPRESSION:  No active cardiopulmonary disease.      Electronically Signed    By: Elmer Picker M.D.    On: 08/11/2021 13:20    Cardiac Monitoring:  The patient was maintained on a cardiac monitor.  I personally viewed and interpreted the cardiac monitored which showed an underlying rhythm of: NSR EKG non-ischemic   Medicines ordered:  I ordered medication including percocet  for pain Reevaluation of the patient after these medicines showed that the patient improved I have reviewed the patients home medicines and have made adjustments as needed   Critical Interventions:     Consults/Attending Physician   I discussed this case with my attending physician who cosigned this note including patient's presenting symptoms, physical exam, and planned diagnostics and interventions. Attending physician stated agreement with plan or made changes to plan which were implemented.   Attending physician assessed patient at bedside.    Reevaluation:  After the interventions noted above I re-evaluated patient and found that they have :improved   Social Determinants of Health:      Problem List / ED Course:  CP - seems more to be muscular back pain that is reproducible with touch on her trigger point.  I did provide her with recommendations follow-up with PCP however did also provide her the information for [facility] cardiology at her request.  Will  discharge home at this time.  She states she feels much improved.  All questions answered to the best my ability.  Strict return precautions provided   Dispostion:  After consideration of the diagnostic results and the patients response to treatment, I feel that the patent would benefit from discharge home with close follow up.  Final Clinical Impression(s) / ED Diagnoses  Final diagnoses:  Acute back pain, unspecified back location, unspecified back pain laterality  Atypical chest pain    Rx / DC Orders ED Discharge Orders     None         Tedd Sias, [place] 08/11/21 1602    Lacretia Leigh, MD 08/14/21 1429

## 2021-08-14 ENCOUNTER — Ambulatory Visit: Payer: Medicare Other | Admitting: Family Medicine

## 2021-08-14 ENCOUNTER — Encounter: Payer: Self-pay | Admitting: Family Medicine

## 2021-08-14 VITALS — BP 94/60 | HR 78 | Temp 97.7°F | Ht 62.0 in | Wt 165.0 lb

## 2021-08-14 DIAGNOSIS — R0781 Pleurodynia: Secondary | ICD-10-CM | POA: Diagnosis not present

## 2021-08-14 DIAGNOSIS — R109 Unspecified abdominal pain: Secondary | ICD-10-CM

## 2021-08-14 DIAGNOSIS — R8290 Unspecified abnormal findings in urine: Secondary | ICD-10-CM | POA: Diagnosis not present

## 2021-08-14 NOTE — Progress Notes (Signed)
Rebecca Long , 07-07-1950, 71 y.o., female MRN: [id number] Patient Care Team    Relationship Specialty Notifications Start End  Ma Hillock, DO PCP - General Family Medicine  10/06/14   Wallene Huh, [place] Consulting Physician Podiatry  10/18/15   Sydnee Levans, MD Consulting Physician Dermatology  10/18/15   Stefanie Libel, MD Consulting Physician Sports Medicine  11/01/15   Ladene Artist, MD Consulting Physician Gastroenterology  11/04/17   Rutherford Guys, MD Consulting Physician Ophthalmology  06/17/18   Princess Bruins, MD Consulting Physician Obstetrics and Gynecology  06/17/18   Eino Farber, PA-C Physician Assistant Psychiatry  06/17/18    Comment: [facility]    Chief Complaint  Patient presents with   Back Pain    ED f/u; pt c/o L flank pain and mid back pain x 5 days;      Subjective: Pt presents for an OV with complaints of left-sided pain that has been present for approximately 5 days.  She has been seen in the urgent care for this condition.  Reviewed results which show unchanged EKG, troponins normal.  Chest x-ray was normal..  Labs unremarkable.  Patient had tenderness on exam suggesting musculoskeletal cause.  She reports the symptoms are about the same today.  They are reproducible.  She was provided with muscle relaxant which was not very helpful.  She states most of all she wanted to make sure it was in her heart, since her brother recently passed away from an MI.    08-03-2021    1:17 PM 03/29/2021   10:13 AM 11/29/2020    9:31 AM 03/23/2020    9:54 AM 01/06/2020    8:35 AM  Depression screen PHQ 2/9  Decreased Interest 2 0 1 0 0  Down, Depressed, Hopeless '2 1 1 1 '$ 0  PHQ - 2 Score '4 1 2 1 '$ 0  Altered sleeping 0 0 1    Tired, decreased energy 3 0 2    Change in appetite 3 0 2    Feeling bad or failure about yourself  2 0 1    Trouble concentrating '2 1 1    '$ Moving slowly or fidgety/restless 0 0 0    Suicidal thoughts 0 0 0    PHQ-9 Score  '14 2 9    '$ Difficult doing work/chores  Somewhat difficult       Allergies  Allergen Reactions   Latex Itching and Rash   Polysporin [Bacitracin-Polymyxin B] Rash   Social History   Social History Narrative   Lives with husband. Feels safe at home. 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 [employer]. Some college. Former smoker. Wears seat belt.    Past Medical History:  Diagnosis Date   Anxiety    Cavus foot, acquired 09/09/2012    bilateral cavus feet the one on the left shows that she has some probable neurogenic weakness with abnormal curvature and in inversion contracture Podiatry 06/2015: H&P and x-ray reviewed with patient. Today I went ahead and I did a proximal nerve block I was able to aspirate the second MPJ and got out amount of clear fluid and injected with a quarter cc dexamethasone Kenalog and ap   DDD (degenerative disc disease), lumbar    Depression    Endometrial polyp    GERD (gastroesophageal reflux disease)    Hiatal hernia    Hip flexor tendinitis, right 07/02/2016   History of adenomatous polyp of colon    History of  esophageal stricture    History of gastric polyp 11/2017   History of primary hyperparathyroidism    s/p  left parathryoidectomy 09-17-2008-- resolved   Hyperlipidemia    Hypertension    Insomnia    Lumbar stenosis    Osteopenia    Schatzki's ring    Scoliosis    Tendinopathy of right biceps tendon 07/02/2016   Thickened endometrium    Uterine fibroid    UTI (urinary tract infection) due to Enterococcus 01/05/2020   Wears glasses    Past Surgical History:  Procedure Laterality Date   BREAST REDUCTION SURGERY Bilateral 2000   COLONOSCOPY     CYST EXCISION  1985   head and rt axilla   DILATATION & CURETTAGE/HYSTEROSCOPY WITH MYOSURE N/A 08/13/2018   Procedure: DILATATION & CURETTAGE/HYSTEROSCOPY WITH MYOSURE;  Surgeon: Princess Bruins, MD;  Location: [facility];  Service: Gynecology;  Laterality: N/A;  requests 10:15am in  [place] [facility] block request 30 minutes   DILATION AND CURETTAGE OF UTERUS N/A 08/13/2018   ECTOPIC PREGNANCY SURGERY  1981   PARATHYROIDECTOMY Left 09/17/2008   dr Ronnald Collum  '@[facility]'$    left superior  (adenoma)   SHOULDER SURGERY Right 2007   "frozen"   UPPER GASTROINTESTINAL ENDOSCOPY  last one 12-18-2017   dr Rush Landmark   Family History  Problem Relation Age of Onset   Heart disease Father 94   Breast cancer Mother    Hypertension Brother    Hypertension Sister    Breast cancer Maternal Aunt    Colon cancer Neg Hx    Esophageal cancer Neg Hx    Stomach cancer Neg Hx    Rectal cancer Neg Hx    Colon polyps Neg Hx    Allergies as of 08/14/2021       Reactions   Latex Itching, Rash   Polysporin [bacitracin-polymyxin B] Rash        Medication List        Accurate as of August 14, 2021  2:26 PM. If you have any questions, ask your nurse or doctor.          atorvastatin 20 MG tablet Commonly known as: LIPITOR Take 1 tablet (20 mg total) by mouth daily.   Cyanocobalamin 1000 MCG/15ML Liqd Take 15 mLs (1,000 mcg total) by mouth daily.   FLUoxetine 40 MG capsule Commonly known as: PROZAC Take 1 capsule (40 mg total) by mouth daily.   methocarbamol 500 MG tablet Commonly known as: ROBAXIN Take 1 tablet (500 mg total) by mouth 2 (two) times daily.   omeprazole 40 MG capsule Commonly known as: PRILOSEC Take 1 capsule (40 mg total) by mouth daily.   telmisartan 80 MG tablet Commonly known as: MICARDIS Take 1 tablet (80 mg total) by mouth daily.   valACYclovir 500 MG tablet Commonly known as: VALTREX Take 500 mg by mouth as needed.   Vitamin D3 10 MCG (400 UNIT) Caps SMARTSIG:1 Capsule(s) By Mouth   zolpidem 5 MG tablet Commonly known as: AMBIEN Take 0.5-1 tablets (2.5-5 mg total) by mouth at bedtime.        All past medical history, surgical history, allergies, family history, immunizations andmedications were updated in the EMR today and reviewed under  the history and medication portions of their EMR.     ROS Negative, with the exception of above mentioned in HPI   Objective:  BP 94/60   Pulse 78   Temp 97.7 F (36.5 C) (Oral)   Ht '5\' 2"'$  (1.575 m)  Wt 165 lb (74.8 kg)   SpO2 96%   BMI 30.18 kg/m  Body mass index is 30.18 kg/m. Physical Exam Vitals and nursing note reviewed.  Constitutional:      General: She is not in acute distress.    Appearance: Normal appearance. She is not ill-appearing, toxic-appearing or diaphoretic.  HENT:     Head: Normocephalic and atraumatic.     Mouth/Throat:     Mouth: Mucous membranes are moist.  Eyes:     General: No scleral icterus.       Right eye: No discharge.        Left eye: No discharge.     Extraocular Movements: Extraocular movements intact.     Conjunctiva/sclera: Conjunctivae normal.     Pupils: Pupils are equal, round, and reactive to light.  Cardiovascular:     Rate and Rhythm: Normal rate and regular rhythm.  Pulmonary:     Effort: Pulmonary effort is normal. No respiratory distress.     Breath sounds: Normal breath sounds. No wheezing, rhonchi or rales.  Musculoskeletal:     Cervical back: Neck supple. No tenderness.     Right lower leg: No edema.     Left lower leg: No edema.     Comments: Tender to palpation left lateral rib cage.  No erythema.  No bruising.  No rash.  Lymphadenopathy:     Cervical: No cervical adenopathy.  Skin:    General: Skin is warm and dry.     Coloration: Skin is not jaundiced or pale.     Findings: No erythema or rash.  Neurological:     Mental Status: She is alert and oriented to person, place, and time. Mental status is at baseline.     Motor: No weakness.     Gait: Gait normal.  Psychiatric:        Mood and Affect: Mood normal.        Behavior: Behavior normal.        Thought Content: Thought content normal.        Judgment: Judgment normal.      No results found. No results found. No results found for this or any previous  visit (from the past 24 hour(s)).  Assessment/Plan: Rebecca Long is a 71 y.o. female present for OV for  Flank pain/rib pain/urine odor Suspect her pain is muscle skeletal in nature.  She is tender over lateral lower rib cage.  Will obtain urinalysis since she feels she has an abnormal odor to her urine.  Otherwise reassured - Urinalysis w microscopic + reflex cultur   Reviewed expectations re: course of current medical issues. Discussed self-management of symptoms. Outlined signs and symptoms indicating need for more acute intervention. Patient verbalized understanding and all questions were answered. Patient received an After-Visit Summary.    No orders of the defined types were placed in this encounter.  No orders of the defined types were placed in this encounter.  Referral Orders  No referral(s) requested today     Note is dictated utilizing voice recognition software. Although note has been proof read prior to signing, occasional typographical errors still can be missed. If any questions arise, please do not hesitate to call for verification.   electronically signed by:  Howard Pouch, DO  [place]

## 2021-08-15 ENCOUNTER — Other Ambulatory Visit: Payer: Medicare Other

## 2021-08-15 ENCOUNTER — Telehealth: Payer: Self-pay

## 2021-08-15 DIAGNOSIS — R197 Diarrhea, unspecified: Secondary | ICD-10-CM | POA: Diagnosis not present

## 2021-08-15 LAB — URINALYSIS W MICROSCOPIC + REFLEX CULTURE
Bacteria, UA: NONE SEEN /HPF
Bilirubin Urine: NEGATIVE
Glucose, UA: NEGATIVE
Hgb urine dipstick: NEGATIVE
Hyaline Cast: NONE SEEN /LPF
Ketones, ur: NEGATIVE
Leukocyte Esterase: NEGATIVE
Nitrites, Initial: NEGATIVE
Protein, ur: NEGATIVE
Specific Gravity, Urine: 1.026 (ref 1.001–1.035)
Squamous Epithelial / HPF: NONE SEEN /HPF (ref <=5)
WBC, UA: NONE SEEN /HPF (ref 0–5)
pH: 5.5 (ref 5.0–8.0)

## 2021-08-15 LAB — NO CULTURE INDICATED

## 2021-08-15 NOTE — Telephone Encounter (Signed)
Pt seen pcp 07/17  Emerita FUT [facility] Gender: Female DOB: 27-Dec-1950 Age: 71 Y 10 M 11 D Return Phone Number: [phone] (Primary) Address: City/ State/ Zip: [place] [address] Client [facility] - Client Client Site [facility] - Day Provider AA - PHYSICIAN, Verita Schneiders- MD Contact Type Call Who Is Calling Patient / Member / Family / Caregiver Call Type Triage / Clinical Relationship To Patient Self Return Phone Number [phone] (Primary) Chief Complaint CHEST PAIN - pain, pressure, heaviness or tightness Reason for Call Symptomatic / Request for [place] has back and chest pain, needs to schedule an appt. Translation No Nurse Assessment Nurse: Hum[place]et, RN, Estill Bamberg Date/Time (Eastern Time): 08/11/2021 9:15:15 AM Confirm and document reason for call. If symptomatic, describe symptoms. ---caller states she has chest pain and back for a few days. feels like muscle pain. comes and goes. Does the patient have any new or worsening symptoms? ---Yes Will a triage be completed? ---Yes Related visit to physician within the last 2 weeks? ---No Does the PT have any chronic conditions? (i.e. diabetes, asthma, this includes High risk factors for pregnancy, etc.) ---Yes List chronic conditions. ---obese, HTN, brother had MI and father died heart disease Is this a behavioral health or substance abuse call? ---No Guidelines Guideline Title Affirmed Question Affirmed Notes Nurse Date/Time (Eastern Time) Chest Pain [1] Chest pain lasts < 5 minutes AND [2] NO chest pain or cardiac symptoms (e.g., breathing difficulty, sweating) now (Exception: Chest pains that last only a few seconds.) Humfleet, RN, Estill Bamberg 08/11/2021 9:16:06 AM PLEASE NOTE: All timestamps contained within this report are represented as [place] Standard Time. CONFIDENTIALTY NOTICE: This fax transmission is intended only for the  addressee. It contains information that is legally privileged, confidential or otherwise protected from use or disclosure. If you are not the intended recipient, you are strictly prohibited from reviewing, disclosing, copying using or disseminating any of this information or taking any action in reliance on or regarding this information. If you have received this fax in error, please notify us immediately by telephone so that we can arrange for its return to [place]. Phone: [phone], Toll-Free: [phone], Fax: [phone] Page: 2 of 2 Call Id: [id number] [place]. Time Eilene Ghazi Time) Disposition Final User 08/11/2021 9:14:35 AM Send to Urgent Clarnce Flock 08/11/2021 9:31:03 AM See PCP within 24 Hours Yes Humfleet, RN, Estill Bamberg Final Disposition 08/11/2021 9:31:03 AM See PCP within 24 Hours Yes Hum[place], RN, Shelly Coss Disagree/Comply Comply Caller Understands Yes PreDisposition InappropriateToAsk Care Advice Given Per Guideline SEE PCP WITHIN 24 HOURS: * IF OFFICE WILL BE OPEN: You need to be examined within the next 24 hours. Call your doctor (or NP/PA) when the office opens and make an appointment. CARE ADVICE given per Chest Pain (Adult) guideline. CALL BACK IF: * You become worse * Difficulty breathing occurs * Chest pain lasts over 5 minutes Comments User: Rozelle Logan, RN Date/Time Eilene Ghazi Time): 08/11/2021 9:19:40 AM pain starts in the back and goes around to the front under bra area. User: Rozelle Logan, RN Date/Time Eilene Ghazi Time): 08/11/2021 9:31:02 AM no associated nausea. has been treated for acid reflux symptoms with endoscopy showing scar tissue in esophagus. those symptoms have not worsened. Referrals REFERRED TO PCP OFFICE

## 2021-08-17 ENCOUNTER — Other Ambulatory Visit: Payer: Self-pay

## 2021-08-17 DIAGNOSIS — R197 Diarrhea, unspecified: Secondary | ICD-10-CM

## 2021-08-18 LAB — TISSUE TRANSGLUTAMINASE ABS,IGG,IGA
(tTG) Ab, IgA: <1 U/mL
(tTG) Ab, IgG: <1 U/mL

## 2021-08-18 LAB — TEST AUTHORIZATION

## 2021-08-18 LAB — IGA: Immunoglobulin A: 173 mg/dL (ref 70–320)

## 2021-08-23 ENCOUNTER — Ambulatory Visit (INDEPENDENT_AMBULATORY_CARE_PROVIDER_SITE_OTHER): Payer: Medicare Other

## 2021-08-23 DIAGNOSIS — E538 Deficiency of other specified B group vitamins: Secondary | ICD-10-CM

## 2021-08-23 NOTE — Progress Notes (Signed)
Pt here for monthly B12 injection per Dr. Raoul Pitch   B12 1039mg given IM, and pt tolerated injection well.   Next B12 injection scheduled for B12 injections every 2 weeks for 6 doses by nurse visit.  I would still encourage her to buy over-the-counter B12 sublingual solution 1000 mcg daily to start after B12 injections are completed to maintain the levels.

## 2021-09-06 ENCOUNTER — Ambulatory Visit (INDEPENDENT_AMBULATORY_CARE_PROVIDER_SITE_OTHER): Payer: Medicare Other

## 2021-09-06 DIAGNOSIS — E538 Deficiency of other specified B group vitamins: Secondary | ICD-10-CM

## 2021-09-06 NOTE — Progress Notes (Signed)
Pt here for biweekly B12 injection per Dr. Raoul Pitch. (4/6 doses)   B12 1060mg given IM, and pt tolerated injection well.   Next B12 injection scheduled for B12 injections every 2 weeks for 6 doses by nurse visit.  I would still encourage her to buy over-the-counter B12 sublingual solution 1000 mcg daily to start after B12 injections are completed to maintain the levels.

## 2021-09-18 ENCOUNTER — Ambulatory Visit: Payer: Medicare Other

## 2021-09-18 NOTE — Progress Notes (Signed)
Pt here for biweekly B12 injection per Dr. Raoul Pitch. (5/6 doses)   B12 102mg given IM, and pt tolerated injection well.   Next B12 injection scheduled for B12 injections every 2 weeks for 6 doses by nurse visit.  I would still encourage her to buy over-the-counter B12 sublingual solution 1000 mcg daily to start after B12 injections are completed to maintain the levels.

## 2021-09-28 NOTE — Progress Notes (Signed)
   I, Rebecca Long, LAT, ATC acting as a scribe for [name], MD.  Rebecca Long is a 71 y.o. female who presents to [place] at [facility] today for cont'd R lateral hip pain thought to be due to gluteus minimus tendinitis/GT bursitis. Pt was last seen by Dr. Georgina Snell on 06/29/21 and was given a R GT steroid injection and was advised to cont to work on [employer]. Today, pt reports she was feeling OK and then played a game of pickleball on the 2nd week of June and felt like she "pushed off" and felt a pain in her R hip. Pt still locates pain to the lateral aspect of the R hip.  Dx imaging: 06/24/21 R hip MRI  11/04/20 R hip & L-spine MRI  Pertinent review of systems: No fevers or chills  Relevant historical information: Hypertension   Exam:  BP 128/82   Ht '5\' 2"'$  (1.575 m)   Wt 169 lb (76.7 kg)   BMI 30.91 kg/m  General: Well Developed, well nourished, and in no acute distress.   MSK: Right hip: Normal. Tender palpation greater trochanter. Normal hip motion.  Hip abduction strength is reduced with pain.   Lab and Radiology Results  Procedure: Real-time Ultrasound Guided Injection of right lateral hip greater trochanter bursa Device: Philips Affiniti 50G Images permanently stored and available for review in PACS Verbal informed consent obtained.  Discussed risks and benefits of procedure. Warned about infection, bleeding, hyperglycemia damage to structures among others. Patient expresses understanding and agreement Time-out conducted.   Noted no overlying erythema, induration, or other signs of local infection.   Skin prepped in a sterile fashion.   Local anesthesia: Topical Ethyl chloride.   With sterile technique and under real time ultrasound guidance: 40 milligrams of Kenalog and 2 mL of Marcaine injected into greater trochanter bursa. Fluid seen entering the bursa.   Completed without difficulty   Pain immediately resolved suggesting accurate placement of the  medication.   Advised to call if fevers/chills, erythema, induration, drainage, or persistent bleeding.   Images permanently stored and available for review in the ultrasound unit.  Impression: Technically successful ultrasound guided injection.         Assessment and Plan: 71 y.o. female with right lateral hip pain.  This is an acute exacerbation of a chronic problem.  Plan on repeat injection today.  If her pain returns quickly next step should be shockwave therapy.  Check back as needed.   PDMP not reviewed this encounter. Orders Placed This Encounter  Procedures   [place] LIMITED JOINT SPACE STRUCTURES LOW RIGHT(NO LINKED CHARGES)    Order Specific Question:   Reason for Exam (SYMPTOM  OR DIAGNOSIS REQUIRED)    Answer:   right hip pain    Order Specific Question:   Preferred imaging location?    Answer:   [facility]   No orders of the defined types were placed in this encounter.    Discussed warning signs or symptoms. Please see discharge instructions. Patient expresses understanding.   The above documentation has been reviewed and is accurate and complete [name], M.D.

## 2021-09-29 ENCOUNTER — Ambulatory Visit: Payer: Self-pay

## 2021-09-29 ENCOUNTER — Ambulatory Visit: Payer: Medicare Other | Admitting: Family Medicine

## 2021-09-29 VITALS — BP 128/82 | Ht 62.0 in | Wt 169.0 lb

## 2021-09-29 DIAGNOSIS — M7061 Trochanteric bursitis, right hip: Secondary | ICD-10-CM | POA: Diagnosis not present

## 2021-09-29 DIAGNOSIS — M25551 Pain in right hip: Secondary | ICD-10-CM | POA: Diagnosis not present

## 2021-09-29 NOTE — Patient Instructions (Addendum)
Thank you for coming in today.   You received an injection today. Seek immediate medical attention if the joint becomes red, extremely painful, or is oozing fluid.   If not better let me know

## 2021-10-10 ENCOUNTER — Ambulatory Visit (INDEPENDENT_AMBULATORY_CARE_PROVIDER_SITE_OTHER): Payer: Medicare Other

## 2021-10-10 DIAGNOSIS — E538 Deficiency of other specified B group vitamins: Secondary | ICD-10-CM

## 2021-10-10 MED ORDER — CYANOCOBALAMIN 1000 MCG/ML IJ SOLN
1000.0000 ug | Freq: Once | INTRAMUSCULAR | Status: AC
  Administered 2021-10-10: 1000 ug via INTRAMUSCULAR

## 2021-10-10 NOTE — Progress Notes (Signed)
Pt here for biweekly B12 injection per Dr. Raoul Pitch. (6/6 doses)   B12 1024mg given IM, and pt tolerated injection well.   Next B12 injection scheduled for B12 injections every 2 weeks for 6 doses by nurse visit.  I would still encourage her to buy over-the-counter B12 sublingual solution 1000 mcg daily to start after B12 injections are completed to maintain the levels.

## 2021-10-11 ENCOUNTER — Telehealth: Payer: Self-pay | Admitting: Gastroenterology

## 2021-10-11 NOTE — Telephone Encounter (Signed)
Patient never complete stool sample study,request another order . Please call her wants done , so she can pick it up.

## 2021-10-11 NOTE — Telephone Encounter (Signed)
Spoke with patient & advised her that order is still current and she can come by at anytime to pick up stool kit between 7:30 am - 5:00 pm without an appointment. Pt verbalized all understanding.

## 2021-10-16 ENCOUNTER — Encounter: Payer: Self-pay | Admitting: Family Medicine

## 2021-10-16 ENCOUNTER — Telehealth (INDEPENDENT_AMBULATORY_CARE_PROVIDER_SITE_OTHER): Payer: Medicare Other | Admitting: Family Medicine

## 2021-10-16 DIAGNOSIS — J208 Acute bronchitis due to other specified organisms: Secondary | ICD-10-CM | POA: Diagnosis not present

## 2021-10-16 DIAGNOSIS — B9689 Other specified bacterial agents as the cause of diseases classified elsewhere: Secondary | ICD-10-CM

## 2021-10-16 MED ORDER — AMOXICILLIN-POT CLAVULANATE 875-125 MG PO TABS: 1.0000 | ORAL_TABLET | Freq: Two times a day (BID) | ORAL | 0 refills | Status: DC

## 2021-10-16 MED ORDER — PREDNISONE 20 MG PO TABS: 40.0000 mg | ORAL_TABLET | Freq: Every day | ORAL | 0 refills | Status: DC

## 2021-10-16 NOTE — Progress Notes (Signed)
VIRTUAL VISIT VIA VIDEO  I connected with [place] on 10/16/21 at  9:00 AM EDT by a video enabled telemedicine application and verified that I am speaking with the correct person using two identifiers. Location patient: Home Location provider: [facility], Office Persons participating in the virtual visit: Patient, Dr. Raoul Pitch and Cyndra Numbers, CMA  I discussed the limitations of evaluation and management by telemedicine and the availability of in person appointments. The patient expressed understanding and agreed to proceed.    Rebecca Long , [birth year], 71 y.o., female MRN: [id number] Patient Care Team    Relationship Specialty Notifications Start End  Ma Hillock, DO PCP - General Family Medicine  10/06/14   Wallene Huh, [place] Consulting Physician Podiatry  10/18/15   Sydnee Levans, MD Consulting Physician Dermatology  10/18/15   Stefanie Libel, MD Consulting Physician Sports Medicine  11/01/15   Ladene Artist, MD Consulting Physician Gastroenterology  11/04/17   Rutherford Guys, MD Consulting Physician Ophthalmology  06/17/18   Princess Bruins, MD Consulting Physician Obstetrics and Gynecology  06/17/18   Eino Farber, PA-C Physician Assistant Psychiatry  06/17/18    Comment: [facility]    Chief Complaint  Patient presents with   Cough    Pt c/o low grade temp, cough, sore throat, HA, neg at-home test x 6 days     Subjective: Pt presents for an OV with complaints of resp illness of 6 days duration. She denies shortness of breath but feels congested in her chest. She is tolerating PO. Her cough is dry, but deep.  Pt reports she was in a lightening store last week and felt lightheaded and fainted. Her husband was present and was able to support her. She reports she lost consciousness for just a few seconds. She had fainted in the past when she was dehydrated. She reports she has been out of her normal return since she had guest the last 2  weeks. Pt has tried mucinex to ease their symptoms.      07/05/2021    1:17 PM 03/29/2021   10:13 AM 11/29/2020    9:31 AM 03/23/2020    9:54 AM 01/06/2020    8:35 AM  Depression screen PHQ 2/9  Decreased Interest 2 0 1 0 0  Down, Depressed, Hopeless '2 1 1 1 '$ 0  PHQ - 2 Score '4 1 2 1 '$ 0  Altered sleeping 0 0 1    Tired, decreased energy 3 0 2    Change in appetite 3 0 2    Feeling bad or failure about yourself  2 0 1    Trouble concentrating '2 1 1    '$ Moving slowly or fidgety/restless 0 0 0    Suicidal thoughts 0 0 0    PHQ-9 Score '14 2 9    '$ Difficult doing work/chores  Somewhat difficult       Allergies  Allergen Reactions   Latex Itching and Rash   Polysporin [Bacitracin-Polymyxin B] Rash   Social History   Social History Narrative   Lives with husband. Feels safe at home. 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 [employer]. Some college. Former smoker. Wears seat belt.    Past Medical History:  Diagnosis Date   Anxiety    Cavus foot, acquired 09/09/2012    bilateral cavus feet the one on the left shows that she has some probable neurogenic weakness with abnormal curvature and in inversion contracture Podiatry 06/2015: H&P and  x-ray reviewed with patient. Today I went ahead and I did a proximal nerve block I was able to aspirate the second MPJ and got out amount of clear fluid and injected with a quarter cc dexamethasone Kenalog and ap   DDD (degenerative disc disease), lumbar    Depression    Endometrial polyp    GERD (gastroesophageal reflux disease)    Hiatal hernia    Hip flexor tendinitis, right 07/02/2016   History of adenomatous polyp of colon    History of esophageal stricture    History of gastric polyp 11/2017   History of primary hyperparathyroidism    s/p  left parathryoidectomy 09-17-2008-- resolved   Hyperlipidemia    Hypertension    Insomnia    Lumbar stenosis    Osteopenia    Schatzki's ring    Scoliosis    Tendinopathy of right biceps tendon 07/02/2016    Thickened endometrium    Uterine fibroid    UTI (urinary tract infection) due to Enterococcus 01/05/2020   Wears glasses    Past Surgical History:  Procedure Laterality Date   BREAST REDUCTION SURGERY Bilateral 2000   COLONOSCOPY     CYST EXCISION  1985   head and rt axilla   DILATATION & CURETTAGE/HYSTEROSCOPY WITH MYOSURE N/A 08/13/2018   Procedure: [place];  Surgeon: Princess Bruins, MD;  Location: [place];  Service: Gynecology;  Laterality: N/A;  requests 10:15am in [place] Gyn block request 30 minutes   DILATION AND CURETTAGE OF UTERUS N/A 08/13/2018   ECTOPIC PREGNANCY SURGERY  1981   PARATHYROIDECTOMY Left 09/17/2008   dr Ronnald Collum  '@[facility]'$    left superior  (adenoma)   SHOULDER SURGERY Right 2007   "frozen"   UPPER GASTROINTESTINAL ENDOSCOPY  last one 12-18-2017   dr Rush Landmark   Family History  Problem Relation Age of Onset   Heart disease Father 37   Breast cancer Mother    Hypertension Brother    Hypertension Sister    Breast cancer Maternal Aunt    Colon cancer Neg Hx    Esophageal cancer Neg Hx    Stomach cancer Neg Hx    Rectal cancer Neg Hx    Colon polyps Neg Hx    Allergies as of 10/16/2021       Reactions   Latex Itching, Rash   Polysporin [bacitracin-polymyxin B] Rash        Medication List        Accurate as of October 16, 2021  9:11 AM. If you have any questions, ask your nurse or doctor.          amoxicillin-clavulanate 875-125 MG tablet Commonly known as: AUGMENTIN Take 1 tablet by mouth 2 (two) times daily. Started by: Howard Pouch, DO   atorvastatin 20 MG tablet Commonly known as: LIPITOR Take 1 tablet (20 mg total) by mouth daily.   FLUoxetine 40 MG capsule Commonly known as: PROZAC Take 1 capsule (40 mg total) by mouth daily.   methocarbamol 500 MG tablet Commonly known as: ROBAXIN Take 1 tablet (500 mg total) by mouth 2 (two) times daily.   omeprazole 40 MG  capsule Commonly known as: PRILOSEC Take 1 capsule (40 mg total) by mouth daily.   predniSONE 20 MG tablet Commonly known as: DELTASONE Take 2 tablets (40 mg total) by mouth daily with breakfast. Started by: Howard Pouch, DO   telmisartan 80 MG tablet Commonly known as: MICARDIS Take 1 tablet (80 mg total) by mouth daily.  valACYclovir 500 MG tablet Commonly known as: VALTREX Take 500 mg by mouth as needed.   Vitamin D3 10 MCG (400 UNIT) Caps SMARTSIG:1 Capsule(s) By Mouth   zolpidem 5 MG tablet Commonly known as: AMBIEN Take 0.5-1 tablets (2.5-5 mg total) by mouth at bedtime.        All past medical history, surgical history, allergies, family history, immunizations andmedications were updated in the EMR today and reviewed under the history and medication portions of their EMR.     Review of Systems  Constitutional:  Positive for fever and malaise/fatigue. Negative for chills.  HENT:  Positive for congestion, sinus pain and sore throat.   Eyes:  Negative for blurred vision, discharge and redness.  Respiratory:  Positive for cough. Negative for sputum production, shortness of breath and wheezing.   Cardiovascular:  Negative for chest pain, palpitations and leg swelling.  Gastrointestinal:  Negative for abdominal pain, blood in stool, nausea and vomiting.  Skin:  Negative for rash.  Neurological:  Positive for dizziness and headaches. Negative for focal weakness.   Negative, with the exception of above mentioned in HPI   Objective:  There were no vitals taken for this visit. There is no height or weight on file to calculate BMI. Physical Exam Vitals and nursing note reviewed.  Constitutional:      General: She is not in acute distress.    Appearance: Normal appearance. She is normal weight. She is not ill-appearing or toxic-appearing.  HENT:     Head: Normocephalic and atraumatic.  Eyes:     Extraocular Movements: Extraocular movements intact.      Conjunctiva/sclera: Conjunctivae normal.     Pupils: Pupils are equal, round, and reactive to light.  Pulmonary:     Effort: Pulmonary effort is normal.     Comments: Mild cough. Neurological:     Mental Status: She is alert and oriented to person, place, and time. Mental status is at baseline.  Psychiatric:        Mood and Affect: Mood normal.        Behavior: Behavior normal.        Thought Content: Thought content normal.        Judgment: Judgment normal.     No results found. No results found. No results found for this or any previous visit (from the past 24 hour(s)).  Assessment/Plan: SHIZUE KASEMAN is a 71 y.o. female present for OV for  Acute bacterial bronchitis Rest, hydrate.  +/- flonase, mucinex (DM if cough), nettie pot or nasal saline.  Augmentin BID prescribed, take until completed.  F/u 2 weeks if not resolved, sooner if worsening. In person.    Reviewed expectations re: course of current medical issues. Discussed self-management of symptoms. Outlined signs and symptoms indicating need for more acute intervention. Patient verbalized understanding and all questions were answered. Patient received an After-Visit Summary.    No orders of the defined types were placed in this encounter.  Meds ordered this encounter  Medications   amoxicillin-clavulanate (AUGMENTIN) 875-125 MG tablet    Sig: Take 1 tablet by mouth 2 (two) times daily.    Dispense:  20 tablet    Refill:  0   predniSONE (DELTASONE) 20 MG tablet    Sig: Take 2 tablets (40 mg total) by mouth daily with breakfast.    Dispense:  10 tablet    Refill:  0   Referral Orders  No referral(s) requested today     Note is dictated utilizing voice  recognition software. Although note has been proof read prior to signing, occasional typographical errors still can be missed. If any questions arise, please do not hesitate to call for verification.   electronically signed by:  Howard Pouch, DO  [place]

## 2021-10-17 ENCOUNTER — Other Ambulatory Visit: Payer: Medicare Other

## 2021-10-17 DIAGNOSIS — R197 Diarrhea, unspecified: Secondary | ICD-10-CM

## 2021-10-17 DIAGNOSIS — A048 Other specified bacterial intestinal infections: Secondary | ICD-10-CM | POA: Diagnosis not present

## 2021-10-19 ENCOUNTER — Telehealth: Payer: Self-pay | Admitting: Gastroenterology

## 2021-10-19 LAB — GI PROFILE, STOOL, PCR

## 2021-10-19 NOTE — Telephone Encounter (Signed)
Patient is returning your call.  

## 2021-10-19 NOTE — Telephone Encounter (Signed)
Left message for patient to call back  

## 2021-10-19 NOTE — Telephone Encounter (Signed)
Inbound call from patient stating the antibiotic she is taking is making her diarrhea worse. She is also wondering if the results from her stool sample are in. Please advise.

## 2021-10-19 NOTE — Telephone Encounter (Signed)
Spoke with patient & advised that she contact her PCP since they recently prescribed her the antibiotics and steroids to see if there is an alternative medication that she may need. She is aware we will contact her once stood study has resulted. Pt verbalized all understanding.

## 2021-10-23 ENCOUNTER — Encounter (HOSPITAL_BASED_OUTPATIENT_CLINIC_OR_DEPARTMENT_OTHER): Payer: Self-pay | Admitting: Emergency Medicine

## 2021-10-23 ENCOUNTER — Emergency Department (HOSPITAL_BASED_OUTPATIENT_CLINIC_OR_DEPARTMENT_OTHER)
Admission: EM | Admit: 2021-10-23 | Discharge: 2021-10-23 | Disposition: A | Discharge status: Home / Self Care | Payer: Medicare Other | Attending: Emergency Medicine | Admitting: Emergency Medicine

## 2021-10-23 ENCOUNTER — Other Ambulatory Visit (HOSPITAL_BASED_OUTPATIENT_CLINIC_OR_DEPARTMENT_OTHER): Payer: Self-pay

## 2021-10-23 ENCOUNTER — Other Ambulatory Visit: Payer: Self-pay

## 2021-10-23 ENCOUNTER — Emergency Department (HOSPITAL_BASED_OUTPATIENT_CLINIC_OR_DEPARTMENT_OTHER): Payer: Medicare Other

## 2021-10-23 ENCOUNTER — Telehealth (HOSPITAL_BASED_OUTPATIENT_CLINIC_OR_DEPARTMENT_OTHER): Payer: Self-pay | Admitting: Emergency Medicine

## 2021-10-23 DIAGNOSIS — Z91040 Latex allergy status: Secondary | ICD-10-CM | POA: Insufficient documentation

## 2021-10-23 DIAGNOSIS — D72829 Elevated white blood cell count, unspecified: Secondary | ICD-10-CM | POA: Insufficient documentation

## 2021-10-23 DIAGNOSIS — I10 Essential (primary) hypertension: Secondary | ICD-10-CM | POA: Diagnosis not present

## 2021-10-23 DIAGNOSIS — K5730 Diverticulosis of large intestine without perforation or abscess without bleeding: Secondary | ICD-10-CM | POA: Diagnosis not present

## 2021-10-23 DIAGNOSIS — R197 Diarrhea, unspecified: Secondary | ICD-10-CM

## 2021-10-23 DIAGNOSIS — K529 Noninfective gastroenteritis and colitis, unspecified: Secondary | ICD-10-CM | POA: Insufficient documentation

## 2021-10-23 DIAGNOSIS — K6389 Other specified diseases of intestine: Secondary | ICD-10-CM | POA: Diagnosis not present

## 2021-10-23 LAB — URINALYSIS, ROUTINE W REFLEX MICROSCOPIC
Bilirubin Urine: NEGATIVE
Glucose, UA: NEGATIVE mg/dL
Ketones, ur: NEGATIVE mg/dL
Nitrite: NEGATIVE
RBC / HPF: >50 RBC/hpf — ABNORMAL HIGH (ref 0–5)
Specific Gravity, Urine: >1.046 — ABNORMAL HIGH (ref 1.005–1.030)
WBC, UA: >50 WBC/hpf — ABNORMAL HIGH (ref 0–5)
pH: 5.5 (ref 5.0–8.0)

## 2021-10-23 LAB — COMPREHENSIVE METABOLIC PANEL
ALT: 23 U/L (ref 0–44)
AST: 14 U/L — ABNORMAL LOW (ref 15–41)
Albumin: 4 g/dL (ref 3.5–5.0)
Alkaline Phosphatase: 48 U/L (ref 38–126)
Anion gap: 11 (ref 5–15)
BUN: 13 mg/dL (ref 8–23)
CO2: 24 mmol/L (ref 22–32)
Calcium: 8.9 mg/dL (ref 8.9–10.3)
Chloride: 99 mmol/L (ref 98–111)
Creatinine, Ser: 0.74 mg/dL (ref 0.44–1.00)
GFR, Estimated: >60 mL/min (ref >60)
Glucose, Bld: 119 mg/dL — ABNORMAL HIGH (ref 70–99)
Potassium: 3.3 mmol/L — ABNORMAL LOW (ref 3.5–5.1)
Sodium: 134 mmol/L — ABNORMAL LOW (ref 135–145)
Total Bilirubin: 0.8 mg/dL (ref 0.3–1.2)
Total Protein: 6.3 g/dL — ABNORMAL LOW (ref 6.5–8.1)

## 2021-10-23 LAB — GASTROINTESTINAL PANEL BY PCR, STOOL (REPLACES STOOL CULTURE)

## 2021-10-23 LAB — CBC
HCT: 41 % (ref 36.0–46.0)
Hemoglobin: 13.6 g/dL (ref 12.0–15.0)
MCH: 28.7 pg (ref 26.0–34.0)
MCHC: 33.2 g/dL (ref 30.0–36.0)
MCV: 86.5 fL (ref 80.0–100.0)
Platelets: 294 10*3/uL (ref 150–400)
RBC: 4.74 MIL/uL (ref 3.87–5.11)
RDW: 13.4 % (ref 11.5–15.5)
WBC: 15.9 10*3/uL — ABNORMAL HIGH (ref 4.0–10.5)
nRBC: 0 % (ref 0.0–0.2)

## 2021-10-23 LAB — C DIFFICILE QUICK SCREEN W PCR REFLEX
C Diff antigen: POSITIVE — AB
C Diff toxin: NEGATIVE

## 2021-10-23 LAB — OCCULT BLOOD X 1 CARD TO LAB, STOOL: Fecal Occult Bld: NEGATIVE

## 2021-10-23 LAB — LACTIC ACID, PLASMA
Lactic Acid, Venous: 0.8 mmol/L (ref 0.5–1.9)
Lactic Acid, Venous: 0.8 mmol/L (ref 0.5–1.9)

## 2021-10-23 LAB — CBG MONITORING, ED: Glucose-Capillary: 119 mg/dL — ABNORMAL HIGH (ref 70–99)

## 2021-10-23 LAB — TROPONIN I (HIGH SENSITIVITY)
Troponin I (High Sensitivity): 6 ng/L (ref <18)
Troponin I (High Sensitivity): 5 ng/L (ref <18)

## 2021-10-23 LAB — CLOSTRIDIUM DIFFICILE BY PCR, REFLEXED: Toxigenic C. Difficile by PCR: POSITIVE — AB

## 2021-10-23 LAB — LIPASE, BLOOD: Lipase: 25 U/L (ref 11–51)

## 2021-10-23 MED ORDER — VANCOMYCIN HCL 125 MG PO CAPS: 125.0000 mg | ORAL_CAPSULE | Freq: Four times a day (QID) | ORAL | 0 refills | Status: AC

## 2021-10-23 MED ORDER — SODIUM CHLORIDE 0.9 % IV SOLN
1.0000 g | Freq: Once | INTRAVENOUS | Status: AC
  Administered 2021-10-23: 1 g via INTRAVENOUS
  Filled 2021-10-23: qty 10

## 2021-10-23 MED ORDER — METRONIDAZOLE 500 MG/100ML IV SOLN
500.0000 mg | Freq: Two times a day (BID) | INTRAVENOUS | Status: DC
  Administered 2021-10-23: 500 mg via INTRAVENOUS
  Filled 2021-10-23: qty 100

## 2021-10-23 MED ORDER — ONDANSETRON 4 MG PO TBDP
4.0000 mg | ORAL_TABLET | Freq: Three times a day (TID) | ORAL | 0 refills | Status: DC | PRN
  Filled 2021-10-23: qty 20, 7d supply, fill #0

## 2021-10-23 MED ORDER — IOHEXOL 300 MG/ML  SOLN
100.0000 mL | Freq: Once | INTRAMUSCULAR | Status: AC | PRN
  Administered 2021-10-23: 80 mL via INTRAVENOUS

## 2021-10-23 MED ORDER — SODIUM CHLORIDE 0.9 % IV BOLUS
1000.0000 mL | Freq: Once | INTRAVENOUS | Status: AC
  Administered 2021-10-23: 1000 mL via INTRAVENOUS

## 2021-10-23 MED ORDER — AMOXICILLIN-POT CLAVULANATE 875-125 MG PO TABS
1.0000 | ORAL_TABLET | Freq: Two times a day (BID) | ORAL | 0 refills | Status: DC
  Filled 2021-10-23: qty 14, 7d supply, fill #0

## 2021-10-23 MED ORDER — SODIUM CHLORIDE 0.9 % IV BOLUS
1000.0000 mL | Freq: Once | INTRAVENOUS | Status: AC
Start: 2021-10-23 — End: 2021-10-23
  Administered 2021-10-23: 1000 mL via INTRAVENOUS

## 2021-10-23 MED ORDER — SODIUM CHLORIDE 0.9 % IV SOLN: Freq: Once | INTRAVENOUS | Status: AC

## 2021-10-23 MED ORDER — ONDANSETRON HCL 4 MG/2ML IJ SOLN
4.0000 mg | Freq: Once | INTRAMUSCULAR | Status: AC
  Administered 2021-10-23: 4 mg via INTRAVENOUS
  Filled 2021-10-23: qty 2

## 2021-10-23 NOTE — Telephone Encounter (Signed)
C dif returned positive. Called patient to stop augmentin. PO vancomycin sent to pharmacy. Left message on voicemail.

## 2021-10-23 NOTE — ED Notes (Signed)
Pt ambulates fine in the room. No issues or dizziness. Due to Enteric Precautions and being hooked up to IV for ABX, did not walk patient in the hall.  Leanne Chang, RN

## 2021-10-23 NOTE — Discharge Instructions (Addendum)
Your CT scan shows evidence of colitis.  Take the antibiotics as prescribed. Follow-up your C. difficile test later today online.  If the C. difficile test is positive, stop taking Augmentin and call your doctor for prescription for p.o. vancomycin.  Or return to the ED. Return the ED also with worsening abdominal pain, chest pain, shortness of breath.  Your CT scan did show a nodule to your left lung which needs to be followed up in 1 year to make sure it is not changing in size

## 2021-10-23 NOTE — ED Provider Notes (Signed)
[facility] EMERGENCY DEPT Provider Note   CSN: [id number] Arrival date & time: 10/23/21  0801     History  Chief Complaint  Patient presents with   Diarrhea    Rebecca Long is a 70 y.o. female.  Patient with a history of hypertension, hyperlipidemia presenting with 2-week history of diarrhea and abdominal cramping.  States she been having diarrhea 4-5 times daily for the past 2 weeks as well as 3-4 times overnight.  Diarrhea is intermittently bloody with bright red blood mixed with the stool and blood with wiping.  Denies any black stool.  Has had nausea but no vomiting.  No fever.  Still eating and drinking well.  No travel or sick contacts.  Was briefly on antibiotics for a upper respiratory infection but the diarrhea preceded this.  She stopped the antibiotics after 2 days when she thought it was making her diarrhea worse.  She submitted a stool sample to her GI doctor and tested positive for norovirus on September 19 but is still having diarrhea.  She comes in today because she feels dehydrated and lightheaded.  She did have a syncopal episode last week while in the grocery store and her husband had to catch her.  Was preceded by dizziness and lightheadedness.  Denies hitting her head.  No chest pain or shortness of breath. Denies any significant abdominal pain but does have cramping.  Has nausea.  Is no longer on antibiotics.  Believes she has something different than the norovirus. She stopped the antibiotic she was taking for her URI about 1 week ago and took it for about 2 days.  She felt it was making her diarrhea worse.  The history is provided by the patient and the spouse.  Diarrhea Associated symptoms: abdominal pain   Associated symptoms: no arthralgias, no fever, no myalgias and no vomiting        Home Medications Prior to Admission medications   Medication Sig Start Date End Date Taking? Authorizing Provider  amoxicillin-clavulanate (AUGMENTIN) 875-125  MG tablet Take 1 tablet by mouth 2 (two) times daily. 10/16/21   Kuneff, Renee A, DO  atorvastatin (LIPITOR) 20 MG tablet Take 1 tablet (20 mg total) by mouth daily. 07/05/21   Kuneff, Renee A, DO  Cholecalciferol (VITAMIN D3) 10 MCG (400 UNIT) CAPS SMARTSIG:1 Capsule(s) By Mouth 01/04/21   [provider]  FLUoxetine (PROZAC) 40 MG capsule Take 1 capsule (40 mg total) by mouth daily. 07/05/21   Kuneff, Renee A, DO  methocarbamol (ROBAXIN) 500 MG tablet Take 1 tablet (500 mg total) by mouth 2 (two) times daily. 08/11/21   Tedd Sias, PA  omeprazole (PRILOSEC) 40 MG capsule Take 1 capsule (40 mg total) by mouth daily. 11/29/20   Kuneff, Renee A, DO  predniSONE (DELTASONE) 20 MG tablet Take 2 tablets (40 mg total) by mouth daily with breakfast. 10/16/21   Kuneff, Renee A, DO  telmisartan (MICARDIS) 80 MG tablet Take 1 tablet (80 mg total) by mouth daily. 07/05/21   Kuneff, Renee A, DO  valACYclovir (VALTREX) 500 MG tablet Take 500 mg by mouth as needed.     [provider]  zolpidem (AMBIEN) 5 MG tablet Take 0.5-1 tablets (2.5-5 mg total) by mouth at bedtime. 07/05/21   Kuneff, Renee A, DO      Allergies    Latex and Polysporin [bacitracin-polymyxin b]    Review of Systems   Review of Systems  Constitutional:  Positive for activity change and appetite change. Negative  for fever.  Respiratory:  Negative for cough, chest tightness and shortness of breath.   Gastrointestinal:  Positive for abdominal pain, diarrhea and nausea. Negative for vomiting.  Musculoskeletal:  Negative for arthralgias and myalgias.  Skin:  Negative for rash.   all other systems are negative except as noted in the HPI and PMH.    Physical Exam Updated Vital Signs BP (!) 140/67 (BP Location: Right Arm)   Pulse 80   Temp 99.2 F (37.3 C) (Oral)   Resp (!) 22   Ht '5\' 2"'$  (1.575 m)   Wt 76.7 kg   SpO2 96%   BMI 30.93 kg/m  Physical Exam Vitals and nursing note reviewed.  Constitutional:      General:  She is not in acute distress.    Appearance: She is well-developed.  HENT:     Head: Normocephalic and atraumatic.     Mouth/Throat:     Pharynx: No oropharyngeal exudate.  Eyes:     Conjunctiva/sclera: Conjunctivae normal.     Pupils: Pupils are equal, round, and reactive to light.  Neck:     Comments: No meningismus. Cardiovascular:     Rate and Rhythm: Normal rate and regular rhythm.     Heart sounds: Normal heart sounds. No murmur heard. Pulmonary:     Effort: Pulmonary effort is normal. No respiratory distress.     Breath sounds: Normal breath sounds.  Abdominal:     Palpations: Abdomen is soft.     Tenderness: There is no abdominal tenderness. There is no guarding or rebound.  Genitourinary:    Comments: Chaperone present Presenter, broadcasting.  No gross blood.  No obvious hemorrhoid Musculoskeletal:        General: No tenderness. Normal range of motion.     Cervical back: Normal range of motion and neck supple.  Skin:    General: Skin is warm.  Neurological:     Mental Status: She is alert and oriented to person, place, and time.     Cranial Nerves: No cranial nerve deficit.     Motor: No abnormal muscle tone.     Coordination: Coordination normal.     Comments:  5/5 strength throughout. CN 2-12 intact.Equal grip strength.   Psychiatric:        Behavior: Behavior normal.     ED Results / Procedures / Treatments   Labs (all labs ordered are listed, but only abnormal results are displayed) Labs Reviewed  C DIFFICILE QUICK SCREEN W PCR REFLEX   - Abnormal; Notable for the following components:      Result Value   C Diff antigen POSITIVE (*)    All other components within normal limits  CLOSTRIDIUM DIFFICILE BY PCR, REFLEXED - Abnormal; Notable for the following components:   Toxigenic C. Difficile by PCR POSITIVE (*)    All other components within normal limits  COMPREHENSIVE METABOLIC PANEL - Abnormal; Notable for the following components:   Sodium 134 (*)    Potassium  3.3 (*)    Glucose, Bld 119 (*)    Total Protein 6.3 (*)    AST 14 (*)    All other components within normal limits  CBC - Abnormal; Notable for the following components:   WBC 15.9 (*)    All other components within normal limits  URINALYSIS, ROUTINE W REFLEX MICROSCOPIC - Abnormal; Notable for the following components:   Specific Gravity, Urine >1.046 (*)    Hgb urine dipstick SMALL (*)    Protein, ur TRACE (*)  Leukocytes,Ua MODERATE (*)    RBC / HPF >50 (*)    WBC, UA >50 (*)    All other components within normal limits  CBG MONITORING, ED - Abnormal; Notable for the following components:   Glucose-Capillary 119 (*)    All other components within normal limits  GASTROINTESTINAL PANEL BY PCR, STOOL (REPLACES STOOL CULTURE)  URINE CULTURE  LIPASE, BLOOD  OCCULT BLOOD X 1 CARD TO LAB, STOOL  LACTIC ACID, PLASMA  LACTIC ACID, PLASMA  TROPONIN I (HIGH SENSITIVITY)  TROPONIN I (HIGH SENSITIVITY)    EKG EKG Interpretation  Date/Time:  Monday October 23 2021 08:15:18 EDT Ventricular Rate:  81 PR Interval:  139 QRS Duration: 97 QT Interval:  358 QTC Calculation: 416 R Axis:   30 Text Interpretation: Sinus rhythm Low voltage, precordial leads No significant change was found Confirmed by Ezequiel Essex [phone]) on 10/23/2021 8:22:10 AM  Radiology CT ABDOMEN PELVIS W CONTRAST  Result Date: 10/23/2021 CLINICAL DATA:  Diarrhea for 2 weeks, abdominal cramping, syncopal episode last week, blood in stool, dehydration, history hypertension, chronic diarrhea, former smoker EXAM: CT ABDOMEN AND PELVIS WITH CONTRAST TECHNIQUE: Multidetector CT imaging of the abdomen and pelvis was performed using the standard protocol following bolus administration of intravenous contrast. RADIATION DOSE REDUCTION: This exam was performed according to the departmental dose-optimization program which includes automated exposure control, adjustment of the mA and/or kV according to patient size and/or use  of iterative reconstruction technique. CONTRAST:  68m OMNIPAQUE IOHEXOL 300 MG/ML SOLN IV. No oral contrast. COMPARISON:  None Available. FINDINGS: Lower chest: 9 mm ground-glass nodule subpleural posterior RIGHT lower lobe image 6. Remaining lung bases clear. Hepatobiliary: Liver and gallbladder normal appearance. Pancreas: Normal appearance Spleen: Normal appearance Adrenals/Urinary Tract: Adrenal glands, kidneys, ureters, and bladder normal appearance Stomach/Bowel: Normal appendix. Stomach decompressed, unremarkable. Small bowel loops normal appearance. Mild diffuse colonic wall thickening consistent with colitis. No focal pericolic inflammatory changes. Few sigmoid diverticula noted. No evidence of bowel obstruction. Vascular/Lymphatic: Atherosclerotic calcifications aorta and iliac arteries without aneurysm. Vascular structures patent. Reproductive: Nodular uterus likely containing leiomyomata. Unremarkable adnexa Other: Small amount of free pelvic fluid. Stranding of small-bowel mesentery consistent with fibrosing mesenteritis. No free air. Small umbilical hernia containing fat. Musculoskeletal: Osseous demineralization with scoliosis and degenerative disc/facet disease changes of lumbar spine. IMPRESSION: Mild diffuse colonic wall thickening consistent with colitis; differential diagnosis includes infection and inflammatory bowel disease. Stranding of small-bowel mesentery consistent with fibrosing mesenteritis. Small umbilical hernia containing fat. Sigmoid diverticulosis. Nodular uterus likely containing leiomyomata. 9 mm ground-glass nodule at posterior RIGHT lower lobe, nonspecific; follow-up CT chest recommended at 6-12 months to confirm persistence. Aortic Atherosclerosis (ICD10-I70.0). Electronically Signed   By: MLavonia DanaM.D.   On: 10/23/2021 09:54    Procedures Procedures    Medications Ordered in ED Medications  metroNIDAZOLE (FLAGYL) IVPB 500 mg (500 mg Intravenous New Bag/Given  10/23/21 1130)  sodium chloride 0.9 % bolus 1,000 mL (1,000 mLs Intravenous New Bag/Given 10/23/21 0853)  ondansetron (ZOFRAN) injection 4 mg (4 mg Intravenous Given 10/23/21 0853)  iohexol (OMNIPAQUE) 300 MG/ML solution 100 mL (80 mLs Intravenous Contrast Given 10/23/21 0907)  cefTRIAXone (ROCEPHIN) 1 g in sodium chloride 0.9 % 100 mL IVPB (1 g Intravenous New Bag/Given 10/23/21 1020)  sodium chloride 0.9 % bolus 1,000 mL (1,000 mLs Intravenous New Bag/Given 10/23/21 1023)  0.9 %  sodium chloride infusion ( Intravenous New Bag/Given 10/23/21 1018)    ED Course/ Medical Decision Making/ A&P  Medical Decision Making Amount and/or Complexity of Data Reviewed Labs: ordered. Decision-making details documented in ED Course. Radiology: ordered and independent interpretation performed. Decision-making details documented in ED Course. ECG/medicine tests: ordered and independent interpretation performed. Decision-making details documented in ED Course.  Risk Prescription drug management.  Ongoing diarrhea for the past 2 weeks.  Vitals are stable, no distress.  Abdomen soft without peritoneal signs.  Did test positive for norovirus on September 19.  Was on antibiotics briefly for respiratory infection but stopped them.  IV fluids given.  Orthostatics are negative.  Lactate is normal.  Labs do show leukocytosis of 16. Electrolytes are reassuring and creatinine is reassuring  Hemoccult is negative.  Hemoglobin is stable.  CT the scan shows evidence of diffuse colitis, fibrosing mesenteritis. Lactate is normal.  Patient made aware of lung nodule and need for followup.  C. difficile and stool studies are pending.  Advised patient to check these results online later.  If her C. difficile test is positive she should stop taking the antibiotics and return to the ED for different treatment.  Troponin negative x2.  Low suspicion for ACS or pulmonary embolism.  Abdomen soft without  peritoneal signs.  Patient is tolerating p.o. Followup with PCP and GI. Return precautions dicussed.   Addendum: C Dif antigen and PCR positive.  Call to patient to stop augmentin.  Start PO vancomycin which as called to her pharmacy.        Final Clinical Impression(s) / ED Diagnoses Final diagnoses:  Noninfectious gastroenteritis, unspecified type    Rx / DC Orders ED Discharge Orders     None         Itza Maniaci, Annie Main, MD 10/23/21 1722

## 2021-10-23 NOTE — ED Triage Notes (Signed)
Pt arrives to ED with c/o diarrhea x2 weeks.  Associated symptoms include abdominal cramping. She notes that she "passed out" last week. She notes blood in her stool. She reports dehydration. She does see GI for diarrhea which is a chronic issue for her.

## 2021-10-24 LAB — URINE CULTURE: Culture: 30000 — AB

## 2021-10-25 ENCOUNTER — Telehealth: Payer: Self-pay | Admitting: Gastroenterology

## 2021-10-25 NOTE — Telephone Encounter (Signed)
10/16 follow up appt is fine

## 2021-10-25 NOTE — Telephone Encounter (Signed)
Patient called in stating she was seen in ED yesterday & stool sample was positive for Cdiff. She was prescribed Vancomycin for 10 days. She was told to follow up with our office within 10 days. Patient scheduled for the soonest available appointment which is 11/13/21 at 1:30 pm with Janett Billow, [place].

## 2021-10-25 NOTE — Telephone Encounter (Signed)
Inbound call from patient stating that she went to the ED on 9/25 and was tested for C-diff and it came back positive. They advised patient that she needed to follow up with Dr. Fuller Plan within the next 10 days. Patient is requesting a call back to discuss. Please advise.

## 2021-11-06 NOTE — Telephone Encounter (Signed)
Patient states she completed to full 10 day course of vancomycin for cdiff, and the diarrhea has not improved. Patient has been rescheduled for a sooner appointment 11/07/21 at 3:00 pm with Anderson Malta, [place].

## 2021-11-06 NOTE — Telephone Encounter (Signed)
Patient called states she is still having a lot of pain not sure if she would need a medication refill at this point please advise.

## 2021-11-07 ENCOUNTER — Encounter: Payer: Self-pay | Admitting: Physician Assistant

## 2021-11-07 ENCOUNTER — Ambulatory Visit: Payer: Medicare Other | Admitting: Physician Assistant

## 2021-11-07 VITALS — BP 112/70 | HR 70 | Ht 63.0 in | Wt 163.0 lb

## 2021-11-07 DIAGNOSIS — A0472 Enterocolitis due to Clostridium difficile, not specified as recurrent: Secondary | ICD-10-CM

## 2021-11-07 MED ORDER — VANCOMYCIN HCL 125 MG PO CAPS: ORAL_CAPSULE | ORAL | 0 refills | Status: AC

## 2021-11-07 NOTE — Patient Instructions (Signed)
We have sent the following medications to your pharmacy for you to pick up at your convenience: Vancomycin.  Florastor 1 tablet twice daily for 1-2 months  _______________________________________________________  If you are age 72 or older, your body mass index should be between 23-30. Your Body mass index is 28.87 kg/m. If this is out of the aforementioned range listed, please consider follow up with your Primary Care Provider.  If you are age 61 or younger, your body mass index should be between 19-25. Your Body mass index is 28.87 kg/m. If this is out of the aformentioned range listed, please consider follow up with your Primary Care Provider.   ________________________________________________________  The [facility] providers would like to encourage you to use [facility] to communicate with providers for non-urgent requests or questions.  Due to long hold times on the telephone, sending your provider a message by [facility] may be a faster and more efficient way to get a response.  Please allow 48 business hours for a response.  Please remember that this is for non-urgent requests.  _______________________________________________________

## 2021-11-07 NOTE — Progress Notes (Signed)
Chief Complaint: Follow up C Diff  HPI:    Rebecca Long is a 71 y/o Caucasian female with a past medical history as listed below, known to Dr. Fuller Plan, who presents to clinic today after being recently diagnosed with C. difficile diarrhea.    10/17/2021 GI path panel positive for norovirus.  Patient told to use Imodium 1-2 3 times daily as needed and push fluids.    10/23/2021 patient went to the ER due to prolonged diarrhea and and was found to be C. difficile PCR positive.  Started on Vancomycin.    Today, the patient tells me that for a week or 2 she had diarrhea and then got tested in our clinic and was told she had norovirus diarrhea continued and she was having incontinent stool in her bed at night so she went to the ER as above and was found positive for C. difficile.  She had been on antibiotics for recent sinus infection.  She stopped those antibiotics.  Tells me since finishing the 10 days of Vancomycin she has had no further urgency of stool and decreased frequency, now having 3-4 loose stools a day.  Her stools are still not solid though.  No abdominal pain or fever.    Denies fever, chills, weight loss or blood in her stool.  Past Medical History:  Diagnosis Date   Anxiety    Cavus foot, acquired 09/09/2012    bilateral cavus feet the one on the left shows that she has some probable neurogenic weakness with abnormal curvature and in inversion contracture Podiatry 06/2015: H&P and x-ray reviewed with patient. Today I went ahead and I did a proximal nerve block I was able to aspirate the second MPJ and got out amount of clear fluid and injected with a quarter cc dexamethasone Kenalog and ap   DDD (degenerative disc disease), lumbar    Depression    Endometrial polyp    GERD (gastroesophageal reflux disease)    Hiatal hernia    Hip flexor tendinitis, right 07/02/2016   History of adenomatous polyp of colon    History of esophageal stricture    History of gastric polyp 11/2017   History of  primary hyperparathyroidism    s/p  left parathryoidectomy 09-17-2008-- resolved   Hyperlipidemia    Hypertension    Insomnia    Lumbar stenosis    Osteopenia    Schatzki's ring    Scoliosis    Tendinopathy of right biceps tendon 07/02/2016   Thickened endometrium    Uterine fibroid    UTI (urinary tract infection) due to Enterococcus 01/05/2020   Wears glasses     Past Surgical History:  Procedure Laterality Date   BREAST REDUCTION SURGERY Bilateral 2000   COLONOSCOPY     CYST EXCISION  1985   head and rt axilla   DILATATION & CURETTAGE/HYSTEROSCOPY WITH MYOSURE N/A 08/13/2018   Procedure: [place];  Surgeon: Princess Bruins, MD;  Location: [place];  Service: Gynecology;  Laterality: N/A;  requests 10:15am in [place] Gyn block request 30 minutes   DILATION AND CURETTAGE OF UTERUS N/A 08/13/2018   ECTOPIC PREGNANCY SURGERY  1981   PARATHYROIDECTOMY Left 09/17/2008   dr Ronnald Collum  '@[facility]'$    left superior  (adenoma)   SHOULDER SURGERY Right 2007   "frozen"   UPPER GASTROINTESTINAL ENDOSCOPY  last one 12-18-2017   dr Rush Landmark    Current Outpatient Medications  Medication Sig Dispense Refill   atorvastatin (LIPITOR) 20  MG tablet Take 1 tablet (20 mg total) by mouth daily. 90 tablet 3   Cholecalciferol (VITAMIN D3) 10 MCG (400 UNIT) CAPS SMARTSIG:1 Capsule(s) By Mouth     FLUoxetine (PROZAC) 40 MG capsule Take 1 capsule (40 mg total) by mouth daily. 90 capsule 1   omeprazole (PRILOSEC) 40 MG capsule Take 1 capsule (40 mg total) by mouth daily. 90 capsule 3   ondansetron (ZOFRAN-ODT) 4 MG disintegrating tablet Take 1 tablet (4 mg total) by mouth every 8 (eight) hours as needed for nausea or vomiting. 20 tablet 0   telmisartan (MICARDIS) 80 MG tablet Take 1 tablet (80 mg total) by mouth daily. 90 tablet 1   valACYclovir (VALTREX) 500 MG tablet Take 500 mg by mouth as needed.      zolpidem (AMBIEN) 5 MG tablet Take 0.5-1  tablets (2.5-5 mg total) by mouth at bedtime. 90 tablet 1   No current facility-administered medications for this visit.    Allergies as of 11/07/2021 - Review Complete 11/07/2021  Allergen Reaction Noted   Latex Itching and Rash 08/14/2013   Polysporin [bacitracin-polymyxin b] Rash 09/09/2012    Family History  Problem Relation Age of Onset   Heart disease Father 15   Breast cancer Mother    Hypertension Brother    Hypertension Sister    Breast cancer Maternal Aunt    Colon cancer Neg Hx    Esophageal cancer Neg Hx    Stomach cancer Neg Hx    Rectal cancer Neg Hx    Colon polyps Neg Hx     Social History   Socioeconomic History   Marital status: Married    Spouse name: Not on file   Number of children: 1   Years of education: Not on file   Highest education level: Not on file  Occupational History   Not on file  Tobacco Use   Smoking status: Former    Years: 15.00    Types: Cigarettes    Quit date: 01/30/1991    Years since quitting: 30.7   Smokeless tobacco: Never  Vaping Use   Vaping Use: Never used  Substance and Sexual Activity   Alcohol use: Not Currently   Drug use: Never   Sexual activity: Not Currently    Birth control/protection: Post-menopausal    Comment: 1st intercourse- 17, partners- 12, married- 72 yrs   Other Topics Concern   Not on file  Social History Narrative   Lives with husband. Feels safe at home. 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 [employer]. Some college. Former smoker. Wears seat belt.    Social Determinants of Health   Financial Resource Strain: Low Risk  (03/29/2021)   Overall Financial Resource Strain (CARDIA)    Difficulty of Paying Living Expenses: Not hard at all  Food Insecurity: No Food Insecurity (03/29/2021)   Hunger Vital Sign    Worried About Running Out of Food in the Last Year: Never true    Ran Out of Food in the Last Year: Never true  Transportation Needs: No Transportation Needs (03/29/2021)   PRAPARE -  Hydrologist (Medical): No    Lack of Transportation (Non-Medical): No  Physical Activity: Sufficiently Active (03/29/2021)   Exercise Vital Sign    Days of Exercise per Week: 3 days    Minutes of Exercise per Session: 60 min  Stress: No Stress Concern Present (03/29/2021)   [place]    Feeling of Stress :  Only a little  Social Connections: Moderately Integrated (03/29/2021)   Social Connection and Isolation Panel [NHANES]    Frequency of Communication with Friends and Family: More than three times a week    Frequency of Social Gatherings with Friends and Family: More than three times a week    Attends Religious Services: Never    Marine scientist or Organizations: Yes    Attends Archivist Meetings: 1 to 4 times per year    Marital Status: Married  Human resources officer Violence: Not At Risk (03/29/2021)   Humiliation, Afraid, Rape, and Kick questionnaire    Fear of Current or Ex-Partner: No    Emotionally Abused: No    Physically Abused: No    Sexually Abused: No    Review of Systems:    Constitutional: No weight loss, fever or chills Cardiovascular: No chest pain   Respiratory: No SOB  Gastrointestinal: See HPI and otherwise negative   Physical Exam:  Vital signs: BP 112/70   Pulse 70   Ht '5\' 3"'$  (1.6 m)   Wt 163 lb (73.9 kg)   BMI 28.87 kg/m   Constitutional:   Pleasant Elderly Caucasian female appears to be in NAD, Well developed, Well nourished, alert and cooperative Respiratory: Respirations even and unlabored. Lungs clear to auscultation bilaterally.   No wheezes, crackles, or rhonchi.  Cardiovascular: Normal S1, S2. No MRG. Regular rate and rhythm. No peripheral edema, cyanosis or pallor.  Gastrointestinal:  Soft, nondistended, nontender. No rebound or guarding. Normal bowel sounds. No appreciable masses or hepatomegaly. Rectal:  Not performed.  Psychiatric: Oriented  to person, place and time. Demonstrates good judgement and reason without abnormal affect or behaviors.  RELEVANT LABS AND IMAGING: CBC    Component Value Date/Time   WBC 15.9 (H) 10/23/2021 0817   RBC 4.74 10/23/2021 0817   HGB 13.6 10/23/2021 0817   HCT 41.0 10/23/2021 0817   PLT 294 10/23/2021 0817   MCV 86.5 10/23/2021 0817   MCH 28.7 10/23/2021 0817   MCHC 33.2 10/23/2021 0817   RDW 13.4 10/23/2021 0817   LYMPHSABS 1.8 01/06/2020 0916   MONOABS 0.5 01/06/2020 0916   EOSABS 0.1 01/06/2020 0916   BASOSABS 0.2 (H) 01/06/2020 0916    CMP     Component Value Date/Time   NA 134 (L) 10/23/2021 0817   NA 139 04/29/2014 0000   K 3.3 (L) 10/23/2021 0817   CL 99 10/23/2021 0817   CO2 24 10/23/2021 0817   GLUCOSE 119 (H) 10/23/2021 0817   BUN 13 10/23/2021 0817   BUN 15 04/29/2014 0000   CREATININE 0.74 10/23/2021 0817   CREATININE 0.81 07/05/2021 1336   CALCIUM 8.9 10/23/2021 0817   PROT 6.3 (L) 10/23/2021 0817   ALBUMIN 4.0 10/23/2021 0817   AST 14 (L) 10/23/2021 0817   ALT 23 10/23/2021 0817   ALKPHOS 48 10/23/2021 0817   BILITOT 0.8 10/23/2021 0817   GFRNONAA >60 10/23/2021 0817   GFRNONAA 88 11/10/2015 0831   GFRAA >60 08/11/2018 1042   GFRAA >89 11/10/2015 0831    Assessment: 1.  C. difficile diarrhea: Patient has had decreased urgency and frequency after 10 days of Vancomycin but continues at least 3 loose stools a day  Plan: 1.  Prescribed prolonged taper of Vancomycin 125 mg p.o. every 6 hours x14 days, then 125 mg p.o. every 12 hours x7 days, then 125 mg p.o. daily x7 days, then 125 mg p.o. every other day for 2 weeks.  This was  sent to [facility] as there was reduced cost for the patient on good Rx. 2.  Patient will call and let us know if she continues with any symptoms after finishing the Vancomycin. 3.  Recommend the patient start Florastor 1 tab twice daily for the next 1 to 2 months while treating C. difficile. 4.  Patient asked about fiber supplement which  we discussed was a good idea in general if she is okay with taking it.  Also asked about probiotics going forward.  Explained that after she is over the C. difficile hopefully she will not require one. 5.  Patient to follow in clinic with [place] as needed.  Ellouise Newer, PA-C [facility] 11/07/2021, 3:04 PM  Cc: Howard Pouch A, DO

## 2021-11-13 ENCOUNTER — Ambulatory Visit: Payer: Medicare Other | Admitting: Gastroenterology

## 2021-11-16 ENCOUNTER — Ambulatory Visit: Payer: Self-pay

## 2021-11-16 ENCOUNTER — Ambulatory Visit (INDEPENDENT_AMBULATORY_CARE_PROVIDER_SITE_OTHER): Payer: Medicare Other

## 2021-11-16 ENCOUNTER — Ambulatory Visit: Payer: Medicare Other

## 2021-11-16 ENCOUNTER — Ambulatory Visit: Payer: Medicare Other | Admitting: Family Medicine

## 2021-11-16 VITALS — BP 156/98 | HR 69 | Ht 63.0 in | Wt 165.0 lb

## 2021-11-16 DIAGNOSIS — M25561 Pain in right knee: Secondary | ICD-10-CM

## 2021-11-16 NOTE — Progress Notes (Signed)
I, Peterson Lombard, LAT, ATC acting as a scribe for [name], MD.  Rebecca Long is a 71 y.o. female who presents to [place] at [facility] today for right knee pain.  Patient was previously seen by Dr. Georgina Snell on 09/29/2021 for right GT bursitis.  Today, patient complains of right knee pain the started hurting yesterday when she was in the grocery store. No MOI or trauma, although pt notes she did suffer a fall a few weeks ago landing on bilat knees. Pt suffered a 2nd fall last week, when her foot got caught on her purse strap and she feel onto her L side. Patient locates pain to all over her R knee.   In the interim she was recently diagnosed with C. difficile colitis and has is currently being treated with oral vancomycin and probiotics.  She feels a lot better but still has some loose stools.  R knee swelling: no Mechanical symptoms: no Aggravates: walking, bearing weight Treatments tried: ice, heat, Epsom salt bath  Pertinent review of systems: No fevers or chills.  Positive for loose stools.  Relevant historical information: Hypertension.  C. difficile colitis   Exam:  BP (!) 156/98   Pulse 69   Ht '5\' 3"'$  (1.6 m)   Wt 165 lb (74.8 kg)   SpO2 97%   BMI 29.23 kg/m  General: Well Developed, well nourished, and in no acute distress.   MSK: Right knee: Abrasion anterior knee at tibial tuberosity. Normal knee motion.  Mildly tender palpation anterior knee. Intact strength. Stable ligamentous exam. Negative Murray's test.    Lab and Radiology Results  Diagnostic Limited MSK Ultrasound of: Right knee Quad tendon intact normal. Patellar tendon normal. Lateral joint line normal. Medial joint line mild degenerative appearing. Posterior knee no Baker's cyst. Impression: Mild degenerative changes.   X-ray images right knee obtained today personally and independently interpreted Mild DJD.  No acute fractures. Await formal radiology review   Assessment  and Plan: 71 y.o. female with right knee pain occurring yesterday.  She had a fall about 2 weeks ago with an anterior knee abrasion.  Thought to be due to exacerbation of DJD.  Plan for Voltaren gel oral Tylenol and some watchful waiting.  Would like to avoid steroid injection in the setting of recent C. difficile if possible.  She has a trip in about 2 weeks.  We will see her just before that trip and we can proceed with a steroid injection at that time if needed.  We certainly could do 1 sooner if needed as well.   PDMP not reviewed this encounter. Orders Placed This Encounter  Procedures   [place] LIMITED JOINT SPACE STRUCTURES LOW RIGHT(NO LINKED CHARGES)    Order Specific Question:   Reason for Exam (SYMPTOM  OR DIAGNOSIS REQUIRED)    Answer:   right knee pain    Order Specific Question:   Preferred imaging location?    Answer:   [place]   DG Knee AP/LAT W/[place] Right    Standing Status:   Future    Number of Occurrences:   1    Standing Expiration Date:   12/17/2021    Order Specific Question:   Reason for Exam (SYMPTOM  OR DIAGNOSIS REQUIRED)    Answer:   right knee pain    Order Specific Question:   Preferred imaging location?    Answer:   [facility]   No orders of the defined types were  placed in this encounter.    Discussed warning signs or symptoms. Please see discharge instructions. Patient expresses understanding.   The above documentation has been reviewed and is accurate and complete [name], M.D.

## 2021-11-16 NOTE — Patient Instructions (Signed)
Thank you for coming in today.   Please get an Xray today before you leave   Please use Voltaren gel (Generic Diclofenac Gel) up to 4x daily for pain as needed.  This is available over-the-counter as both the name brand Voltaren gel and the generic diclofenac gel.   If not better, return around Nov 1st for a steroid injection

## 2021-11-20 NOTE — Progress Notes (Signed)
Right knee x-ray shows mild knee arthritis.

## 2021-11-22 ENCOUNTER — Ambulatory Visit: Payer: Medicare Other | Admitting: Gastroenterology

## 2021-11-29 ENCOUNTER — Ambulatory Visit: Payer: Self-pay

## 2021-11-29 ENCOUNTER — Ambulatory Visit: Payer: Medicare Other | Admitting: Family Medicine

## 2021-11-29 VITALS — BP 138/78 | HR 77 | Ht 63.0 in | Wt 163.0 lb

## 2021-11-29 DIAGNOSIS — M25561 Pain in right knee: Secondary | ICD-10-CM

## 2021-11-29 DIAGNOSIS — M7061 Trochanteric bursitis, right hip: Secondary | ICD-10-CM

## 2021-11-29 DIAGNOSIS — M25551 Pain in right hip: Secondary | ICD-10-CM

## 2021-11-29 NOTE — Patient Instructions (Signed)
Thank you for coming in today.   You received an injection today. Seek immediate medical attention if the joint becomes red, extremely painful, or is oozing fluid.   Schedule for the next week or so for shockwave treatments.

## 2021-11-29 NOTE — Progress Notes (Signed)
I, Peterson Lombard, LAT, ATC acting as a scribe for [name], MD.  Rebecca Long is a 71 y.o. female who presents to [place] at [facility] today for her 2-wk f/u of R knee pain. Pt was last seen by Dr. Georgina Snell on 11/16/21 and was advised to use Voltaren gel and Tylenol, as we would like to avoid steroids due to her recent C. Dif infection. Today, pt reports pain seems to be more in her R hip. Pt notes she took a step down and her R leg gave out on her. Pt locates pain to the lateral aspect of the R hip and the lateral aspect of the R knee.   Dx imaging: 11/16/21 R knee XR  Pertinent review of systems: No fevers or chills  Relevant historical information: Chronic right lateral hip pain thought to be trochanteric bursitis/hip abductor tendinopathy.  Recent C. difficile infection. She is scheduled to travel to [place] this weekend to attend her brother's funeral.  Exam:  BP 138/78   Pulse 77   Ht '5\' 3"'$  (1.6 m)   Wt 163 lb (73.9 kg)   SpO2 96%   BMI 28.87 kg/m  General: Well Developed, well nourished, and in no acute distress.   MSK: Right lateral hip tender palpation greater trochanter.  Hip abduction strength is diminished.  Right knee mildly tender palpation lateral knee.  Normal motion and strength.    Lab and Radiology Results  Procedure: Real-time Ultrasound Guided Injection of right lateral hip greater trochanter bursa Device: Philips Affiniti 50G Images permanently stored and available for review in PACS Verbal informed consent obtained.  Discussed risks and benefits of procedure. Warned about infection, bleeding, hyperglycemia damage to structures among others. Patient expresses understanding and agreement Time-out conducted.   Noted no overlying erythema, induration, or other signs of local infection.   Skin prepped in a sterile fashion.   Local anesthesia: Topical Ethyl chloride.   With sterile technique and under real time ultrasound guidance:  40 mg of Kenalog and 2 mL of Marcaine injected into trochanter bursa. Fluid seen entering the bursa.   Completed without difficulty   Pain immediately resolved suggesting accurate placement of the medication.   Advised to call if fevers/chills, erythema, induration, drainage, or persistent bleeding.   Images permanently stored and available for review in the ultrasound unit.  Impression: Technically successful ultrasound guided injection.        Assessment and Plan: 71 y.o. female with right lateral hip pain due to hip abductor tendinopathy and trochanteric bursitis.  Repeat injection today earlier than I would like.  She will schedule in a few weeks for shockwave treatment as well which I think ultimately will be a better option.  Injection today as needed as she is traveling and having more severe pain.  I would like to avoid high-dose or longer systemic steroids given her recent C. difficile infection.   PDMP not reviewed this encounter. Orders Placed This Encounter  Procedures   [place] LIMITED JOINT SPACE STRUCTURES LOW RIGHT(NO LINKED CHARGES)    Order Specific Question:   Reason for Exam (SYMPTOM  OR DIAGNOSIS REQUIRED)    Answer:   right knee pain    Order Specific Question:   Preferred imaging location?    Answer:   [address]   No orders of the defined types were placed in this encounter.    Discussed warning signs or symptoms. Please see discharge instructions. Patient expresses understanding.   The  above documentation has been reviewed and is accurate and complete [name], M.D.

## 2021-12-06 ENCOUNTER — Ambulatory Visit (INDEPENDENT_AMBULATORY_CARE_PROVIDER_SITE_OTHER): Payer: Self-pay | Admitting: Family Medicine

## 2021-12-06 DIAGNOSIS — M7061 Trochanteric bursitis, right hip: Secondary | ICD-10-CM

## 2021-12-06 NOTE — Progress Notes (Signed)
   Re[facility] [place] Phone: [phone]   Extracorporeal Shockwave Therapy Note    Patient is being treated today with ECSWT. Informed consent was obtained and patient tolerated procedure well.   Therapy performed by [name]  Condition treated: Right Trochanteric bursitis Treatment preset used: Trochanteric bursitis Energy used: 90 mJ Frequency used: 10 Hz Number of pulses: 2000 Treatment #1 of #4  Electronically signed by:  Rebecca Long [facility] 3:10 PM 12/06/21

## 2021-12-13 ENCOUNTER — Ambulatory Visit (INDEPENDENT_AMBULATORY_CARE_PROVIDER_SITE_OTHER): Payer: Self-pay | Admitting: Family Medicine

## 2021-12-13 DIAGNOSIS — M7061 Trochanteric bursitis, right hip: Secondary | ICD-10-CM

## 2021-12-13 NOTE — Progress Notes (Signed)
   Re[facility] [place] Phone: [phone]   Extracorporeal Shockwave Therapy Note    Patient is being treated today with ECSWT. Informed consent was obtained and patient tolerated procedure well.   Therapy performed by [name]  Condition treated: Trochanteric bursitis right hip Treatment preset used: Trochanteric bursitis Energy used: 90 mJ Frequency used: 10 Hz Number of pulses: 2000 Treatment #2 of #4  Electronically signed by:  Rebecca Long [facility] 1:52 PM 12/13/21

## 2021-12-19 ENCOUNTER — Ambulatory Visit: Payer: Medicare Other | Admitting: Family Medicine

## 2021-12-19 ENCOUNTER — Ambulatory Visit (INDEPENDENT_AMBULATORY_CARE_PROVIDER_SITE_OTHER): Payer: Self-pay | Admitting: Family Medicine

## 2021-12-19 DIAGNOSIS — M7061 Trochanteric bursitis, right hip: Secondary | ICD-10-CM

## 2021-12-19 NOTE — Progress Notes (Signed)
   Larinda [facility] [place] Phone: [phone]   Extracorporeal Shockwave Therapy Note    Patient is being treated today with ECSWT. Informed consent was obtained and patient tolerated procedure well.   Therapy performed by [name]  Condition treated: Trochanteric bursitis right hip Treatment preset used: Trochanteric bursitis Energy used: 90 mJ Frequency used: 12 Hz Number of pulses: 2500 Treatment #3 of #4  Electronically signed by:  Larinda [facility] 1:24 PM 12/19/21

## 2022-01-02 ENCOUNTER — Telehealth: Payer: Self-pay | Admitting: Physician Assistant

## 2022-01-02 DIAGNOSIS — R197 Diarrhea, unspecified: Secondary | ICD-10-CM

## 2022-01-02 DIAGNOSIS — A0472 Enterocolitis due to Clostridium difficile, not specified as recurrent: Secondary | ICD-10-CM

## 2022-01-02 NOTE — Telephone Encounter (Signed)
Called and spoke with patient regarding recommendations. Pt will stop by the lab today to pick up stool kits. Pt has been advised to make sure she is drinking plenty of fluids. She will go to ED if symptoms worsen. Pt is aware that we will be in touch once results return. Pt verbalized understanding and had no concerns at the end of the call.   Stool study orders in epic.

## 2022-01-02 NOTE — Telephone Encounter (Signed)
C diff toxin and GI pathogen panel Push PO fluids If symptoms worsen then to ED for evaluation

## 2022-01-02 NOTE — Addendum Note (Signed)
Addended by: Yevette Edwards on: 01/02/2022 11:47 AM   Modules accepted: Orders

## 2022-01-02 NOTE — Telephone Encounter (Signed)
Dr. Fuller Plan, Rebecca Long is out of the office this week. Will you please review note below and advise? Thank you   Returned call to patient. She states that she completed the Vancomycin taper on Friday. Pt states that Vancomycin helped a lot but her symptoms never resolved completely. "Terrible diarrhea" returned on Saturday. Pt states that she has about 6 episodes of diarrhea daily. She reports that the volume of her stools start out large and then lessen throughout the day. Pt denies any fever or abdominal pain. Pt reports that her stools "smell terrible". Pt has only been taking 1 Florastor daily, I advised her that she should be taking 1 tablet BID for 1-2 months. Pt is not sure if she needs an extended course of antibiotics.

## 2022-01-02 NOTE — Telephone Encounter (Signed)
Patient is calling states she took her C. Diff antibiotics and she is wondering if she needs to get more. Please advise

## 2022-01-03 ENCOUNTER — Other Ambulatory Visit: Payer: Medicare Other

## 2022-01-04 ENCOUNTER — Other Ambulatory Visit: Payer: Self-pay

## 2022-01-04 ENCOUNTER — Other Ambulatory Visit: Payer: Medicare Other

## 2022-01-04 DIAGNOSIS — R197 Diarrhea, unspecified: Secondary | ICD-10-CM | POA: Diagnosis not present

## 2022-01-04 DIAGNOSIS — A048 Other specified bacterial intestinal infections: Secondary | ICD-10-CM | POA: Diagnosis not present

## 2022-01-04 DIAGNOSIS — A0472 Enterocolitis due to Clostridium difficile, not specified as recurrent: Secondary | ICD-10-CM

## 2022-01-04 MED ORDER — FLUOXETINE HCL 40 MG PO CAPS: 40.0000 mg | ORAL_CAPSULE | Freq: Every day | ORAL | 0 refills | Status: DC

## 2022-01-06 LAB — CLOSTRIDIUM DIFFICILE EIA: C difficile Toxins A+B, EIA: NEGATIVE

## 2022-01-08 ENCOUNTER — Telehealth: Payer: Self-pay | Admitting: Gastroenterology

## 2022-01-08 NOTE — Telephone Encounter (Signed)
Patient is returning call. Please advise? 

## 2022-01-08 NOTE — Telephone Encounter (Signed)
Left message for patient to call back  

## 2022-01-08 NOTE — Telephone Encounter (Signed)
Patient is returning call about lab results. Please advise. Thank you.

## 2022-01-09 ENCOUNTER — Other Ambulatory Visit: Payer: Self-pay

## 2022-01-09 ENCOUNTER — Telehealth: Payer: Self-pay

## 2022-01-09 LAB — GI PROFILE, STOOL, PCR

## 2022-01-09 MED ORDER — FIDAXOMICIN 200 MG PO TABS: 200.0000 mg | ORAL_TABLET | Freq: Two times a day (BID) | ORAL | 0 refills | Status: AC

## 2022-01-09 NOTE — Telephone Encounter (Signed)
Received a call from [place] at [facility] with call report - C Diff Toxin A/B detected. Dr. Fuller Plan is already aware and patient is being treated.

## 2022-01-09 NOTE — Telephone Encounter (Signed)
Refer to result note 01/04/22.

## 2022-01-10 ENCOUNTER — Other Ambulatory Visit: Payer: Self-pay

## 2022-01-10 MED ORDER — TELMISARTAN 80 MG PO TABS: 80.0000 mg | ORAL_TABLET | Freq: Every day | ORAL | 0 refills | Status: DC

## 2022-01-18 ENCOUNTER — Other Ambulatory Visit: Payer: Self-pay | Admitting: Family Medicine

## 2022-02-01 ENCOUNTER — Ambulatory Visit: Payer: Medicare HMO | Admitting: Family Medicine

## 2022-02-01 VITALS — BP 126/70 | HR 68 | Ht 63.0 in | Wt 160.0 lb

## 2022-02-01 DIAGNOSIS — M48062 Spinal stenosis, lumbar region with neurogenic claudication: Secondary | ICD-10-CM

## 2022-02-01 DIAGNOSIS — M5136 Other intervertebral disc degeneration, lumbar region: Secondary | ICD-10-CM | POA: Diagnosis not present

## 2022-02-01 DIAGNOSIS — M545 Low back pain, unspecified: Secondary | ICD-10-CM

## 2022-02-01 DIAGNOSIS — M51369 Other intervertebral disc degeneration, lumbar region without mention of lumbar back pain or lower extremity pain: Secondary | ICD-10-CM

## 2022-02-01 DIAGNOSIS — G8929 Other chronic pain: Secondary | ICD-10-CM | POA: Diagnosis not present

## 2022-02-01 MED ORDER — KETOROLAC TROMETHAMINE 30 MG/ML IJ SOLN
30.0000 mg | Freq: Once | INTRAMUSCULAR | Status: AC
  Administered 2022-02-01: 30 mg via INTRAMUSCULAR

## 2022-02-01 MED ORDER — METHYLPREDNISOLONE ACETATE 40 MG/ML IJ SUSP
40.0000 mg | Freq: Once | INTRAMUSCULAR | Status: AC
  Administered 2022-02-01: 40 mg via INTRAMUSCULAR

## 2022-02-01 NOTE — Patient Instructions (Signed)
Good to see you  Injection in back side MRI ordered for low back  We will be in touch after the MRI

## 2022-02-01 NOTE — Assessment & Plan Note (Signed)
Known degenerative disc disease.  I do believe that patient is having more pain with straight leg test on the FABER test at the moment and is severely tender to palpation in the paraspinal musculature of the lumbar spine.  Patient has failed physical therapy, shockwave therapy, multiple injections in the hip and I do feel at this point that the lumbar spine to see if there is a possible nerve root impingement for a medial branch block and possible radiofrequency ablation could be beneficial for this individual patient wants to avoid surgical intervention if possible.  Will discuss treatment options after imaging

## 2022-02-01 NOTE — Progress Notes (Signed)
[place] Phone: [phone] Subjective:    I'm seeing this patient by the request  of:  Kuneff, Renee A, DO  CC: Right hip and back pain  [id number]  Rebecca Long is a 72 y.o. female coming in with complaint of right hip pain. Patient has been seeing Dr. Georgina Snell for shockwave therapy. Today patient states  that the shockwave has not helped. States that it has been really weird for her because she has been limping for awhile. Patient states that she is noticing that the lower back has been starting to bother her since she has been limping for awhile. States she has been limping for so long she almost cannot remember walking without it.  Patient states that it seems to be worsening.  Starting to affect daily activities, regular appointment missing things she used to enjoy       Past Medical History:  Diagnosis Date   Anxiety    Cavus foot, acquired 09/09/2012    bilateral cavus feet the one on the left shows that she has some probable neurogenic weakness with abnormal curvature and in inversion contracture Podiatry 06/2015: H&P and x-ray reviewed with patient. Today I went ahead and I did a proximal nerve block I was able to aspirate the second MPJ and got out amount of clear fluid and injected with a quarter cc dexamethasone Kenalog and ap   DDD (degenerative disc disease), lumbar    Depression    Endometrial polyp    GERD (gastroesophageal reflux disease)    Hiatal hernia    Hip flexor tendinitis, right 07/02/2016   History of adenomatous polyp of colon    History of esophageal stricture    History of gastric polyp 11/2017   History of primary hyperparathyroidism    s/p  left parathryoidectomy 09-17-2008-- resolved   Hyperlipidemia    Hypertension    Insomnia    Lumbar stenosis    Osteopenia    Schatzki's ring    Scoliosis    Tendinopathy of right biceps tendon 07/02/2016   Thickened endometrium     Uterine fibroid    UTI (urinary tract infection) due to Enterococcus 01/05/2020   Wears glasses    Past Surgical History:  Procedure Laterality Date   BREAST REDUCTION SURGERY Bilateral 2000   COLONOSCOPY     CYST EXCISION  1985   head and rt axilla   DILATATION & CURETTAGE/HYSTEROSCOPY WITH MYOSURE N/A 08/13/2018   Procedure: [place];  Surgeon: Princess Bruins, MD;  Location: [facility];  Service: Gynecology;  Laterality: N/A;  requests 10:15am in [place] Gyn block request 30 minutes   DILATION AND CURETTAGE OF UTERUS N/A 08/13/2018   ECTOPIC PREGNANCY SURGERY  1981   PARATHYROIDECTOMY Left 09/17/2008   dr Ronnald Collum  '@[facility]'$    left superior  (adenoma)   SHOULDER SURGERY Right 2007   "frozen"   UPPER GASTROINTESTINAL ENDOSCOPY  last one 12-18-2017   dr Rush Landmark   Social History   Socioeconomic History   Marital status: Married    Spouse name: Not on file   Number of children: 1   Years of education: Not on file   Highest education level: Not on file  Occupational History   Not on file  Tobacco Use   Smoking status: Former    Years: 15.00    Types: Cigarettes    Quit date: 01/30/1991    Years since quitting: 77.0  Smokeless tobacco: Never  Vaping Use   Vaping Use: Never used  Substance and Sexual Activity   Alcohol use: Not Currently   Drug use: Never   Sexual activity: Not Currently    Birth control/protection: Post-menopausal    Comment: 1st intercourse- 46, partners- 74, married- 89 yrs   Other Topics Concern   Not on file  Social History Narrative   Lives with husband. Feels safe at home. 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 [employer]. Some college. Former smoker. Wears seat belt.    Social Determinants of Health   Financial Resource Strain: Low Risk  (03/29/2021)   Overall Financial Resource Strain (CARDIA)    Difficulty of Paying Living Expenses: Not hard at all  Food Insecurity: No Food Insecurity  (03/29/2021)   Hunger Vital Sign    Worried About Running Out of Food in the Last Year: Never true    Ran Out of Food in the Last Year: Never true  Transportation Needs: No Transportation Needs (03/29/2021)   PRAPARE - Hydrologist (Medical): No    Lack of Transportation (Non-Medical): No  Physical Activity: Sufficiently Active (03/29/2021)   Exercise Vital Sign    Days of Exercise per Week: 3 days    Minutes of Exercise per Session: 60 min  Stress: No Stress Concern Present (03/29/2021)   [place]    Feeling of Stress : Only a little  Social Connections: Moderately Integrated (03/29/2021)   Social Connection and Isolation Panel [NHANES]    Frequency of Communication with Friends and Family: More than three times a week    Frequency of Social Gatherings with Friends and Family: More than three times a week    Attends Religious Services: Never    Marine scientist or Organizations: Yes    Attends Music therapist: 1 to 4 times per year    Marital Status: Married   Allergies  Allergen Reactions   Latex Itching and Rash   Polysporin [Bacitracin-Polymyxin B] Rash   Family History  Problem Relation Age of Onset   Heart disease Father 72   Breast cancer Mother    Hypertension Brother    Hypertension Sister    Breast cancer Maternal Aunt    Colon cancer Neg Hx    Esophageal cancer Neg Hx    Stomach cancer Neg Hx    Rectal cancer Neg Hx    Colon polyps Neg Hx      Current Outpatient Medications (Cardiovascular):    atorvastatin (LIPITOR) 20 MG tablet, Take 1 tablet (20 mg total) by mouth daily.   telmisartan (MICARDIS) 80 MG tablet, Take 1 tablet (80 mg total) by mouth daily.     Current Outpatient Medications (Other):    Cholecalciferol (VITAMIN D3) 10 MCG (400 UNIT) CAPS, SMARTSIG:1 Capsule(s) By Mouth   FLUoxetine (PROZAC) 40 MG capsule, Take 1 capsule (40 mg total)  by mouth daily.   omeprazole (PRILOSEC) 40 MG capsule, TAKE 1 CAPSULE (40 MG TOTAL) BY MOUTH DAILY.   valACYclovir (VALTREX) 500 MG tablet, Take 500 mg by mouth as needed.    zolpidem (AMBIEN) 5 MG tablet, Take 0.5-1 tablets (2.5-5 mg total) by mouth at bedtime.   Reviewed prior external information including notes and imaging from  primary care provider As well as notes that were available from care everywhere and other healthcare systems.  Past medical history, social, surgical and family history all reviewed in electronic medical  record.  No pertanent information unless stated regarding to the chief complaint.   Review of Systems:  No headache, visual changes, nausea, vomiting, diarrhea, constipation, dizziness, abdominal pain, skin rash, fevers, chills, night sweats, weight loss, swollen lymph nodes, body aches, joint swelling, chest pain, shortness of breath, mood changes. POSITIVE muscle aches  Objective  Blood pressure 126/70, pulse 68, height '5\' 3"'$  (1.6 m), weight 160 lb (72.6 kg), SpO2 97 %.   General: No apparent distress alert and oriented x3 mood and affect normal, dressed appropriately.  HEENT: Pupils equal, extraocular movements intact  Respiratory: Patient's speak in full sentences and does not appear short of breath  Cardiovascular: No lower extremity edema, non tender, no erythema  Patient's low back does have loss of lordosis.   Positive straight leg test noted.  This is at 20 degrees of forward flexion.  Deep tendon reflexes are intact but patient does have weakness of the dorsiflexion and hip abduction with 3 out of 5 strength on the right compared to the contralateral side worsening pain with any extension of back greater than 5 degrees.  Severe tenderness to palpation in the paraspinal musculature of the back moderate tenderness over the greater trochanteric area on the right    Impression and Recommendations:    The above documentation has been reviewed and is  accurate and complete Lyndal Pulley, DO

## 2022-02-12 ENCOUNTER — Other Ambulatory Visit: Payer: Self-pay | Admitting: Family Medicine

## 2022-02-17 ENCOUNTER — Ambulatory Visit
Admission: RE | Admit: 2022-02-17 | Discharge: 2022-02-17 | Disposition: A | Discharge status: Home / Self Care | Payer: Medicare HMO | Source: Ambulatory Visit | Attending: Family Medicine | Admitting: Family Medicine

## 2022-02-17 DIAGNOSIS — M545 Low back pain, unspecified: Secondary | ICD-10-CM | POA: Diagnosis not present

## 2022-02-17 DIAGNOSIS — M25551 Pain in right hip: Secondary | ICD-10-CM | POA: Diagnosis not present

## 2022-02-17 DIAGNOSIS — M48061 Spinal stenosis, lumbar region without neurogenic claudication: Secondary | ICD-10-CM | POA: Diagnosis not present

## 2022-02-19 ENCOUNTER — Other Ambulatory Visit: Payer: Self-pay | Admitting: Family Medicine

## 2022-02-23 NOTE — Progress Notes (Unsigned)
[place] Phone: [phone] Subjective:   [name], am serving as a scribe for Dr. Hulan Saas.  I'm seeing this patient by the request  of:  Kuneff, Renee A, DO  CC: Low back pain  [id number]  02/01/2022 Known degenerative disc disease.  I do believe that patient is having more pain with straight leg test on the FABER test at the moment and is severely tender to palpation in the paraspinal musculature of the lumbar spine.  Patient has failed physical therapy, shockwave therapy, multiple injections in the hip and I do feel at this point that the lumbar spine to see if there is a possible nerve root impingement for a medial branch block and possible radiofrequency ablation could be beneficial for this individual patient wants to avoid surgical intervention if possible.  Will discuss treatment options after imaging      Update 02/26/2022 Rebecca Long is a 72 y.o. female coming in with complaint of lumbar, DDD. Patient states that her back pain is no worse than last visit. Pain in R hip is over GT. Injections in this area have not been as effective as the initially.   Had epidural injection that she said worked but she had pain with injection so is apprehensive about getting another.   MRI lumbar 02/17/2022 IMPRESSION: 1. Generalized lumbar spine degeneration with scoliosis. 2. Left foraminal stenosis that is advanced at L5-S1 and moderate at L4-5. 3. Right foraminal stenosis at L1-2. 4. Diffusely patent spinal canal.      Past Medical History:  Diagnosis Date   Anxiety    Cavus foot, acquired 09/09/2012    bilateral cavus feet the one on the left shows that she has some probable neurogenic weakness with abnormal curvature and in inversion contracture Podiatry 06/2015: H&P and x-ray reviewed with patient. Today I went ahead and I did a proximal nerve block I was able to aspirate the second MPJ and got  out amount of clear fluid and injected with a quarter cc dexamethasone Kenalog and ap   DDD (degenerative disc disease), lumbar    Depression    Endometrial polyp    GERD (gastroesophageal reflux disease)    Hiatal hernia    Hip flexor tendinitis, right 07/02/2016   History of adenomatous polyp of colon    History of esophageal stricture    History of gastric polyp 11/2017   History of primary hyperparathyroidism    s/p  left parathryoidectomy 09-17-2008-- resolved   Hyperlipidemia    Hypertension    Insomnia    Lumbar stenosis    Osteopenia    Schatzki's ring    Scoliosis    Tendinopathy of right biceps tendon 07/02/2016   Thickened endometrium    Uterine fibroid    UTI (urinary tract infection) due to Enterococcus 01/05/2020   Wears glasses    Past Surgical History:  Procedure Laterality Date   BREAST REDUCTION SURGERY Bilateral 2000   COLONOSCOPY     CYST EXCISION  1985   head and rt axilla   DILATATION & CURETTAGE/HYSTEROSCOPY WITH MYOSURE N/A 08/13/2018   Procedure: [place];  Surgeon: Princess Bruins, MD;  Location: [facility];  Service: Gynecology;  Laterality: N/A;  requests 10:15am in [place] Gyn block request 30 minutes   DILATION AND CURETTAGE OF UTERUS N/A 08/13/2018   ECTOPIC PREGNANCY SURGERY  1981   PARATHYROIDECTOMY Left 09/17/2008   dr Ronnald Collum  @  [facility]   left superior  (adenoma)   SHOULDER SURGERY Right 2007   "frozen"   UPPER GASTROINTESTINAL ENDOSCOPY  last one 12-18-2017   dr Rush Landmark   Social History   Socioeconomic History   Marital status: Married    Spouse name: Not on file   Number of children: 1   Years of education: Not on file   Highest education level: Not on file  Occupational History   Not on file  Tobacco Use   Smoking status: Former    Years: 15.00    Types: Cigarettes    Quit date: 01/30/1991    Years since quitting: 31.0   Smokeless tobacco: Never  Vaping Use   Vaping  Use: Never used  Substance and Sexual Activity   Alcohol use: Not Currently   Drug use: Never   Sexual activity: Not Currently    Birth control/protection: Post-menopausal    Comment: 1st intercourse- 17, partners- 48, married- 80 yrs   Other Topics Concern   Not on file  Social History Narrative   Lives with husband. Feels safe at home. 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 [employer]. Some college. Former smoker. Wears seat belt.    Social Determinants of Health   Financial Resource Strain: Low Risk  (03/29/2021)   Overall Financial Resource Strain (CARDIA)    Difficulty of Paying Living Expenses: Not hard at all  Food Insecurity: No Food Insecurity (03/29/2021)   Hunger Vital Sign    Worried About Running Out of Food in the Last Year: Never true    Ran Out of Food in the Last Year: Never true  Transportation Needs: No Transportation Needs (03/29/2021)   PRAPARE - Hydrologist (Medical): No    Lack of Transportation (Non-Medical): No  Physical Activity: Sufficiently Active (03/29/2021)   Exercise Vital Sign    Days of Exercise per Week: 3 days    Minutes of Exercise per Session: 60 min  Stress: No Stress Concern Present (03/29/2021)   [place]    Feeling of Stress : Only a little  Social Connections: Moderately Integrated (03/29/2021)   Social Connection and Isolation Panel [NHANES]    Frequency of Communication with Friends and Family: More than three times a week    Frequency of Social Gatherings with Friends and Family: More than three times a week    Attends Religious Services: Never    Marine scientist or Organizations: Yes    Attends Music therapist: 1 to 4 times per year    Marital Status: Married   Allergies  Allergen Reactions   Latex Itching and Rash   Polysporin [Bacitracin-Polymyxin B] Rash   Family History  Problem Relation Age of Onset   Heart disease  Father 23   Breast cancer Mother    Hypertension Brother    Hypertension Sister    Breast cancer Maternal Aunt    Colon cancer Neg Hx    Esophageal cancer Neg Hx    Stomach cancer Neg Hx    Rectal cancer Neg Hx    Colon polyps Neg Hx      Current Outpatient Medications (Cardiovascular):    atorvastatin (LIPITOR) 20 MG tablet, Take 1 tablet (20 mg total) by mouth daily.   telmisartan (MICARDIS) 80 MG tablet, Take 1 tablet (80 mg total) by mouth daily.     Current Outpatient Medications (Other):    Cholecalciferol (VITAMIN D3) 10 MCG (400  UNIT) CAPS, SMARTSIG:1 Capsule(s) By Mouth   FLUoxetine (PROZAC) 40 MG capsule, Take 1 capsule (40 mg total) by mouth daily.   omeprazole (PRILOSEC) 40 MG capsule, TAKE 1 CAPSULE (40 MG TOTAL) BY MOUTH DAILY.   valACYclovir (VALTREX) 500 MG tablet, Take 500 mg by mouth as needed.    zolpidem (AMBIEN) 5 MG tablet, Take 0.5-1 tablets (2.5-5 mg total) by mouth at bedtime.   Reviewed prior external information including notes and imaging from  primary care provider As well as notes that were available from care everywhere and other healthcare systems.  Past medical history, social, surgical and family history all reviewed in electronic medical record.  No pertanent information unless stated regarding to the chief complaint.   Review of Systems:  No headache, visual changes, nausea, vomiting, diarrhea, constipation, dizziness, abdominal pain, skin rash, fevers, chills, night sweats, weight loss, swollen lymph nodes, body aches, joint swelling, chest pain, shortness of breath, mood changes. POSITIVE muscle aches  Objective  Blood pressure 112/84, pulse 62, height '5\' 3"'$  (1.6 m), weight 160 lb (72.6 kg), SpO2 98 %.   General: No apparent distress alert and oriented x3 mood and affect normal, dressed appropriately.  HEENT: Pupils equal, extraocular movements intact  Respiratory: Patient's speak in full sentences and does not appear short of breath   Cardiovascular: No lower extremity edema, non tender, no erythema  Low back exam does have some loss lordosis.  Some tenderness to palpation in the paraspinal musculature.  Patient does have arthritic changes noted of the back with some degenerative scoliosis.  Severely tender to palpation over the sacroiliac joint noted today.  After verbal consent patient was prepped with alcohol swab and with a 21-gauge 2 inch needle injected into the right sacroiliac joint with a total of 1 cc of 0.5% Marcaine and 1 cc of Kenalog 40 mg/mL.  No blood loss.  Postinjection instructions given    Impression and Recommendations:    The above documentation has been reviewed and is accurate and complete Lyndal Pulley, DO

## 2022-02-26 ENCOUNTER — Ambulatory Visit: Payer: Medicare HMO | Admitting: Family Medicine

## 2022-02-26 ENCOUNTER — Ambulatory Visit (INDEPENDENT_AMBULATORY_CARE_PROVIDER_SITE_OTHER): Payer: Medicare HMO

## 2022-02-26 VITALS — BP 112/84 | HR 62 | Ht 63.0 in | Wt 160.0 lb

## 2022-02-26 DIAGNOSIS — M25551 Pain in right hip: Secondary | ICD-10-CM

## 2022-02-26 DIAGNOSIS — M1611 Unilateral primary osteoarthritis, right hip: Secondary | ICD-10-CM | POA: Diagnosis not present

## 2022-02-26 DIAGNOSIS — M47818 Spondylosis without myelopathy or radiculopathy, sacral and sacrococcygeal region: Secondary | ICD-10-CM | POA: Diagnosis not present

## 2022-02-26 DIAGNOSIS — M48062 Spinal stenosis, lumbar region with neurogenic claudication: Secondary | ICD-10-CM | POA: Diagnosis not present

## 2022-02-26 NOTE — Patient Instructions (Signed)
Great to see, Tried an SI joint injection today. Will get you prepared for a facet injection to see if it would be helpful. Follow-up with me again in 6 to 8 weeks

## 2022-02-26 NOTE — Assessment & Plan Note (Signed)
Patient given injection and tolerated the procedure well with some mild improvement in gait noted.  Discussed icing regimen and home exercises, which activities to do and which ones to avoid.  Increase activity slowly otherwise.  Follow-up again in 6 to 8 weeks

## 2022-02-26 NOTE — Assessment & Plan Note (Signed)
Patient has had some difficulty for quite some time.  I believe the patient is having more facet arthritis and we will order an facet joint injection on the right L4-L5 area.  This will be ordered today.  Patient given a right sacroiliac injection and we will see if this makes any significant improvement.  Still concerned with some of the weakness that patient is having and we will get a repeat x-ray of the hip to make sure there is no occult fracture that could also be contributing.  Follow-up with me again in 6 to 8 weeks

## 2022-03-06 ENCOUNTER — Other Ambulatory Visit: Payer: Self-pay | Admitting: Family Medicine

## 2022-03-08 ENCOUNTER — Encounter: Payer: Self-pay | Admitting: Family Medicine

## 2022-03-08 ENCOUNTER — Ambulatory Visit (INDEPENDENT_AMBULATORY_CARE_PROVIDER_SITE_OTHER): Payer: Medicare HMO | Admitting: Family Medicine

## 2022-03-08 VITALS — BP 128/85 | HR 71 | Temp 98.1°F | Ht 63.0 in | Wt 156.2 lb

## 2022-03-08 DIAGNOSIS — Z79899 Other long term (current) drug therapy: Secondary | ICD-10-CM | POA: Diagnosis not present

## 2022-03-08 DIAGNOSIS — E782 Mixed hyperlipidemia: Secondary | ICD-10-CM | POA: Diagnosis not present

## 2022-03-08 DIAGNOSIS — Z0000 Encounter for general adult medical examination without abnormal findings: Secondary | ICD-10-CM

## 2022-03-08 DIAGNOSIS — I10 Essential (primary) hypertension: Secondary | ICD-10-CM | POA: Diagnosis not present

## 2022-03-08 DIAGNOSIS — F418 Other specified anxiety disorders: Secondary | ICD-10-CM | POA: Diagnosis not present

## 2022-03-08 DIAGNOSIS — A0472 Enterocolitis due to Clostridium difficile, not specified as recurrent: Secondary | ICD-10-CM | POA: Diagnosis not present

## 2022-03-08 DIAGNOSIS — G479 Sleep disorder, unspecified: Secondary | ICD-10-CM | POA: Diagnosis not present

## 2022-03-08 DIAGNOSIS — M8580 Other specified disorders of bone density and structure, unspecified site: Secondary | ICD-10-CM

## 2022-03-08 DIAGNOSIS — Z5181 Encounter for therapeutic drug level monitoring: Secondary | ICD-10-CM | POA: Diagnosis not present

## 2022-03-08 DIAGNOSIS — K2100 Gastro-esophageal reflux disease with esophagitis, without bleeding: Secondary | ICD-10-CM | POA: Diagnosis not present

## 2022-03-08 DIAGNOSIS — Z1231 Encounter for screening mammogram for malignant neoplasm of breast: Secondary | ICD-10-CM | POA: Diagnosis not present

## 2022-03-08 DIAGNOSIS — Z1211 Encounter for screening for malignant neoplasm of colon: Secondary | ICD-10-CM

## 2022-03-08 DIAGNOSIS — R69 Illness, unspecified: Secondary | ICD-10-CM | POA: Diagnosis not present

## 2022-03-08 DIAGNOSIS — Z86010 Personal history of colonic polyps: Secondary | ICD-10-CM | POA: Diagnosis not present

## 2022-03-08 LAB — VITAMIN B12: Vitamin B-12: 337 pg/mL (ref 211–911)

## 2022-03-08 LAB — COMPREHENSIVE METABOLIC PANEL
ALT: 17 U/L (ref 0–35)
AST: 16 U/L (ref 0–37)
Albumin: 4.3 g/dL (ref 3.5–5.2)
Alkaline Phosphatase: 46 U/L (ref 39–117)
BUN: 20 mg/dL (ref 6–23)
CO2: 27 mEq/L (ref 19–32)
Calcium: 9.8 mg/dL (ref 8.4–10.5)
Chloride: 104 mEq/L (ref 96–112)
Creatinine, Ser: 0.69 mg/dL (ref 0.40–1.20)
GFR: 86.7 mL/min (ref >60.00)
Glucose, Bld: 81 mg/dL (ref 70–99)
Potassium: 4.5 mEq/L (ref 3.5–5.1)
Sodium: 141 mEq/L (ref 135–145)
Total Bilirubin: 0.8 mg/dL (ref 0.2–1.2)
Total Protein: 6.6 g/dL (ref 6.0–8.3)

## 2022-03-08 LAB — LIPID PANEL
Cholesterol: 197 mg/dL (ref 0–200)
HDL: 72.6 mg/dL (ref >39.00)
LDL Cholesterol: 109 mg/dL — ABNORMAL HIGH (ref 0–99)
NonHDL: 124.28
Total CHOL/HDL Ratio: 3
Triglycerides: 74 mg/dL (ref 0.0–149.0)
VLDL: 14.8 mg/dL (ref 0.0–40.0)

## 2022-03-08 LAB — HEMOGLOBIN A1C: Hgb A1c MFr Bld: 5.7 % (ref 4.6–6.5)

## 2022-03-08 LAB — CBC
HCT: 43.2 % (ref 36.0–46.0)
Hemoglobin: 14 g/dL (ref 12.0–15.0)
MCHC: 32.5 g/dL (ref 30.0–36.0)
MCV: 87.1 fl (ref 78.0–100.0)
Platelets: 304 10*3/uL (ref 150.0–400.0)
RBC: 4.95 Mil/uL (ref 3.87–5.11)
RDW: 14.6 % (ref 11.5–15.5)
WBC: 6.4 10*3/uL (ref 4.0–10.5)

## 2022-03-08 LAB — TSH: TSH: 1.45 u[IU]/mL (ref 0.35–5.50)

## 2022-03-08 LAB — VITAMIN D 25 HYDROXY (VIT D DEFICIENCY, FRACTURES): VITD: 27.66 ng/mL — ABNORMAL LOW (ref 30.00–100.00)

## 2022-03-08 LAB — MAGNESIUM: Magnesium: 2.1 mg/dL (ref 1.5–2.5)

## 2022-03-08 MED ORDER — FLUOXETINE HCL 40 MG PO CAPS: 40.0000 mg | ORAL_CAPSULE | Freq: Every day | ORAL | 1 refills | Status: DC

## 2022-03-08 MED ORDER — ATORVASTATIN CALCIUM 20 MG PO TABS: 20.0000 mg | ORAL_TABLET | Freq: Every day | ORAL | 3 refills | Status: DC

## 2022-03-08 MED ORDER — ZOLPIDEM TARTRATE 5 MG PO TABS: 2.5000 mg | ORAL_TABLET | Freq: Every day | ORAL | 1 refills | Status: DC

## 2022-03-08 MED ORDER — OMEPRAZOLE 40 MG PO CPDR: 40.0000 mg | DELAYED_RELEASE_CAPSULE | Freq: Every day | ORAL | 1 refills | Status: DC

## 2022-03-08 MED ORDER — TELMISARTAN 80 MG PO TABS: 40.0000 mg | ORAL_TABLET | Freq: Every day | ORAL | 1 refills | Status: DC

## 2022-03-08 NOTE — Progress Notes (Signed)
Patient ID: Rebecca Long, female  DOB: 1950/12/09, 72 y.o.   MRN: [id number] Patient Care Team    Relationship Specialty Notifications Start End  Ma Hillock, DO PCP - General Family Medicine  10/06/14   Wallene Huh, [place] Consulting Physician Podiatry  10/18/15   Sydnee Levans, MD Consulting Physician Dermatology  10/18/15   Stefanie Libel, MD Consulting Physician Sports Medicine  11/01/15   Ladene Artist, MD Consulting Physician Gastroenterology  11/04/17   Rutherford Guys, MD Consulting Physician Ophthalmology  06/17/18   Princess Bruins, MD Consulting Physician Obstetrics and Gynecology  06/17/18   Eino Farber, PA-C Physician Assistant Psychiatry  06/17/18    Comment: [facility]    Chief Complaint  Patient presents with   Annual Exam    And chronic condition management; pt is fasting    Subjective: Rebecca Long is a 72 y.o.  Female  present for CPE and Chronic Conditions/illness Management All past medical history, surgical history, allergies, family history, immunizations and social history was obtained and updated from the patient today and entered into the electronic medical record.   Health maintenance:  Colonoscopy: 06/2021, 3 year follow up. Dr. Fuller Plan Mammogram (50-74): 05/2021; at [facility] in [place] Cervical cancer screening(<65):N/A- following with GYN for urinary incontinence, fibroid and poss.uterine hyperplasia Immunizations: tdap UTD 2016, PNA series completed, shingrix completed, flu shot UTD 2023 Infectious disease screening: completed 2017 DEXA: 09/2018, 3 yr rpt- osteopenia -1.8 Glaucoma screen:Yearly at [facility] eye Patient has a Dental home. Hospitalizations/ED visits: reviewed  Hypertension/hyperlipidemia/overweight:   Pt reports compliance with telmisartan 80 mg daily, Lipitor 20 mg daily and daily baby aspirin.  Patient denies chest pain, shortness of breath, dizziness or lower extremity edema.   Pt endorses use of daily baby ASA. Pt is   prescribed statin. Exercise: exercise and pickle ball.  RF: HTN, HLD, Fhx Heart disease   Depression/anxiety/Sleep d/o:had seen psych in the past and transferred care to PCP 2022.  She reports her depression/anxiety is well controlled on prozac 40 mg qd.  She also suffers from sleep disturbance and responds well to ambien 2.5-5 mg qhs prn. She reports this regimen is still working very well for her.    GERD: Pt reports condition is stable with prilosec. Unable to wean off w.o return of symptoms.      03/08/2022   10:13 AM 07/05/2021    1:17 PM 03/29/2021   10:13 AM 11/29/2020    9:31 AM 03/23/2020    9:54 AM  Depression screen PHQ 2/9  Decreased Interest 1 2 0 1 0  Down, Depressed, Hopeless '1 2 1 1 1  '$ PHQ - 2 Score '2 4 1 2 1  '$ Altered sleeping 1 0 0 1   Tired, decreased energy 1 3 0 2   Change in appetite 1 3 0 2   Feeling bad or failure about yourself  1 2 0 1   Trouble concentrating '1 2 1 1   '$ Moving slowly or fidgety/restless 0 0 0 0   Suicidal thoughts 0 0 0 0   PHQ-9 Score '7 14 2 9   '$ Difficult doing work/chores Somewhat difficult  Somewhat difficult        03/08/2022   10:13 AM 07/05/2021    1:18 PM 11/29/2020    9:31 AM 07/23/2019    8:46 AM  GAD 7 : Generalized Anxiety Score  Nervous, Anxious, on Edge '1 1 1 1  '$ Control/stop worrying 1 1 1  0  Worry too much - different things '1 1 1 '$ 0  Trouble relaxing 1  1 0  Restless 0 0 1 0  Easily annoyed or irritable '1 2 1 '$ 0  Afraid - awful might happen '1 2 1 '$ 0  Total GAD 7 Score '6  7 1  '$ Anxiety Difficulty Somewhat difficult   Not difficult at all    Immunization History  Administered Date(s) Administered   Hepatitis A, Adult 11/30/2016   Influenza, High Dose Seasonal PF 10/18/2015, 11/12/2016, 10/18/2017, 09/15/2018, 10/11/2020, 11/07/2021   Influenza,inj,Quad PF,6+ Mos 10/15/2012, 10/06/2014   Influenza-Unspecified 10/15/2012, 10/14/2019   PFIZER Comirnaty(Gray Top)Covid-19 Tri-Sucrose Vaccine 11/07/2021   PFIZER(Purple  Top)SARS-COV-2 Vaccination 02/19/2019, 03/12/2019, 10/12/2019, 04/27/2020   Pfizer Covid-19 Vaccine Bivalent Booster 82yr & up 10/11/2020, 05/23/2021   Pneumococcal Conjugate-13 10/29/2014   Pneumococcal Polysaccharide-23 11/01/2015   Respiratory Syncytial Virus Vaccine,Recomb Aduvanted(Arexvy) 11/23/2021   Td 10/24/2004   Tdap 10/29/2014   Zoster Recombinat (Shingrix) 04/11/2017, 07/25/2017   Zoster, Live 01/30/2007     Past Medical History:  Diagnosis Date   Anxiety    Cavus foot, acquired 09/09/2012    bilateral cavus feet the one on the left shows that she has some probable neurogenic weakness with abnormal curvature and in inversion contracture Podiatry 06/2015: H&P and x-ray reviewed with patient. Today I went ahead and I did a proximal nerve block I was able to aspirate the second MPJ and got out amount of clear fluid and injected with a quarter cc dexamethasone Kenalog and ap   DDD (degenerative disc disease), lumbar    Depression    Endometrial polyp    GERD (gastroesophageal reflux disease)    Hiatal hernia    Hip flexor tendinitis, right 07/02/2016   History of adenomatous polyp of colon    History of esophageal stricture    History of gastric polyp 11/2017   History of PCR DNA positive for HSV2 11/12/2016   History of primary hyperparathyroidism    s/p  left parathryoidectomy 09-17-2008-- resolved   Hyperlipidemia    Hypertension    Insomnia    Lumbar stenosis    Osteopenia    Schatzki's ring    Scoliosis    Tendinopathy of right biceps tendon 07/02/2016   Thickened endometrium    Uterine fibroid    UTI (urinary tract infection) due to Enterococcus 01/05/2020   Wears glasses    Allergies  Allergen Reactions   Latex Itching and Rash   Polysporin [Bacitracin-Polymyxin B] Rash   Past Surgical History:  Procedure Laterality Date   BREAST REDUCTION SURGERY Bilateral 2000   COLONOSCOPY     CYST EXCISION  1985   head and rt axilla   DILATATION &  CURETTAGE/HYSTEROSCOPY WITH MYOSURE N/A 08/13/2018   Procedure: DILATATION & CURETTAGE/HYSTEROSCOPY WITH MYOSURE;  Surgeon: LPrincess Bruins MD;  [place] [facility]  Service: Gynecology;  Laterality: N/A;  [place] [facility] [place] minutes   DILATION AND CURETTAGE OF UTERUS N/A 08/13/2018   ECTOPIC PREGNANCY SURGERY  1981   PARATHYROIDECTOMY Left 09/17/2008   dr aRonnald Collum '@[facility]    left superior  (adenoma)   SHOULDER SURGERY Right 2007   "frozen"   UPPER GASTROINTESTINAL ENDOSCOPY  last one 12-18-2017   dr mRush Landmark  Family History  Problem Relation Age of Onset   Heart disease Father 663  Breast cancer Mother    Hypertension Brother    Hypertension Sister    Breast cancer Maternal Aunt  Colon cancer Neg Hx    Esophageal cancer Neg Hx    Stomach cancer Neg Hx    Rectal cancer Neg Hx    Colon polyps Neg Hx    Social History   Social History Narrative   Lives with husband. Feels safe at home. 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 [employer]. Some college. Former smoker. Wears seat belt.     Allergies as of 03/08/2022       Reactions   Latex Itching, Rash   Polysporin [bacitracin-polymyxin B] Rash        Medication List        Accurate as of March 08, 2022 10:40 AM. If you have any questions, ask your nurse or doctor.          STOP taking these medications    Arexvy 120 MCG/0.5ML injection Generic drug: RSV vaccine recomb adjuvanted Stopped by: Howard Pouch, DO   Fluzone High-Dose Quadrivalent 0.7 ML Susy Generic drug: Influenza Vac High-Dose Quad Stopped by: Howard Pouch, DO       TAKE these medications    atorvastatin 20 MG tablet Commonly known as: LIPITOR Take 1 tablet (20 mg total) by mouth daily.   FLUoxetine 40 MG capsule Commonly known as: PROZAC Take 1 capsule (40 mg total) by mouth daily.   omeprazole 40 MG capsule Commonly known as: PRILOSEC Take 1 capsule (40 mg total) by mouth daily.    Spikevax injection Generic drug: COVID-19 mRNA vaccine (Moderna, >/= 61yr)   telmisartan 80 MG tablet Commonly known as: MICARDIS Take 0.5 tablets (40 mg total) by mouth daily. What changed: how much to take Changed by: RHoward Pouch DO   valACYclovir 500 MG tablet Commonly known as: VALTREX Take 500 mg by mouth as needed.   Vitamin D3 10 MCG (400 UNIT) Caps SMARTSIG:1 Capsule(s) By Mouth   zolpidem 5 MG tablet Commonly known as: AMBIEN Take 0.5-1 tablets (2.5-5 mg total) by mouth at bedtime.        All past medical history, surgical history, allergies, family history, immunizations andmedications were updated in the EMR today and reviewed under the history and medication portions of their EMR.      ROS: 14 pt review of systems performed and negative (unless mentioned in an HPI)  Objective: BP 128/85   Pulse 71   Temp 98.1 F (36.7 C)   Ht '5\' 3"'$  (1.6 m)   Wt 156 lb 3.2 oz (70.9 kg)   SpO2 95%   BMI 27.67 kg/m  Physical Exam Vitals and nursing note reviewed.  Constitutional:      General: She is not in acute distress.    Appearance: Normal appearance. She is not ill-appearing or toxic-appearing.  HENT:     Head: Normocephalic and atraumatic.     Right Ear: Tympanic membrane, ear canal and external ear normal. There is no impacted cerumen.     Left Ear: Tympanic membrane, ear canal and external ear normal. There is no impacted cerumen.     Nose: No congestion or rhinorrhea.     Mouth/Throat:     Mouth: Mucous membranes are moist.     Pharynx: Oropharynx is clear. No oropharyngeal exudate or posterior oropharyngeal erythema.  Eyes:     General: No scleral icterus.       Right eye: No discharge.        Left eye: No discharge.     Extraocular Movements: Extraocular movements intact.     Conjunctiva/sclera: Conjunctivae normal.  Pupils: Pupils are equal, round, and reactive to light.  Cardiovascular:     Rate and Rhythm: Normal rate and regular rhythm.      Pulses: Normal pulses.     Heart sounds: Normal heart sounds. No murmur heard.    No friction rub. No gallop.  Pulmonary:     Effort: Pulmonary effort is normal. No respiratory distress.     Breath sounds: Normal breath sounds. No stridor. No wheezing, rhonchi or rales.  Chest:     Chest wall: No tenderness.  Abdominal:     General: Abdomen is flat. Bowel sounds are normal. There is no distension.     Palpations: Abdomen is soft. There is no mass.     Tenderness: There is no abdominal tenderness. There is no right CVA tenderness, left CVA tenderness, guarding or rebound.     Hernia: No hernia is present.  Musculoskeletal:        General: No swelling, tenderness or deformity. Normal range of motion.     Cervical back: Normal range of motion and neck supple. No rigidity or tenderness.     Right lower leg: No edema.     Left lower leg: No edema.  Lymphadenopathy:     Cervical: No cervical adenopathy.  Skin:    General: Skin is warm and dry.     Coloration: Skin is not jaundiced or pale.     Findings: No bruising, erythema, lesion or rash.  Neurological:     General: No focal deficit present.     Mental Status: She is alert and oriented to person, place, and time. Mental status is at baseline.     Cranial Nerves: No cranial nerve deficit.     Sensory: No sensory deficit.     Motor: No weakness.     Coordination: Coordination normal.     Gait: Gait normal.     Deep Tendon Reflexes: Reflexes normal.  Psychiatric:        Mood and Affect: Mood normal.        Behavior: Behavior normal.        Thought Content: Thought content normal.        Judgment: Judgment normal.    No results found.  Assessment/plan: Rebecca Long is a 72 y.o. female present for CPE with Chronic Conditions/illness Management Essential hypertension, benign/Hyperlipidemia, unspecified hyperlipidemia type/obesity   Stable Continue telmisartan 40 - Continue exercising at least 150 minutes a week. Low-sodium  diet. Cbc, cmp, ths and lipids collected - F/U 5.5 months as long as doing well.   Sleep disturbacne:  stable Discussed safety profile with her and the likelihood of possibly needing to try another medication to help her sleep in the future. Continue Ambien 2.5-5 seems to be working well with her without negative side effects. NCCS database reviewed 03/08/22  Contract signed 11/29/2020 Follow-up face-to-face every 5.5 months needed for refills.   Depression with anxiety/insomnia  Stable  Continue  prozac 40  mg qd.   Gastroesophageal reflux disease with esophagitis without hemorrhage/long-term PPI therapy Continue to follow-up with Dr. Fuller Plan Continue Prilosec 40 mg daily Avoid dietary triggers B12, mag and vitamin D collected today   Patient was encouraged to exercise greater than 150 minutes a week. Patient was encouraged to choose a diet filled with fresh fruits and vegetables, and lean meats. AVS provided to patient today for education/recommendation on gender specific health and safety maintenance. Colonoscopy: 06/2021, 3 year follow up. Dr. Fuller Plan Mammogram (50-74): 05/2021; at [facility] in [facility]  Cervical cancer screening(<65):N/A- following with GYN for urinary incontinence, fibroid and poss.uterine hyperplasia Immunizations: tdap UTD 2016, PNA series completed, shingrix completed, flu shot UTD 2023 Infectious disease screening: completed 2017 DEXA: 09/2018, 3 yr rpt- osteopenia -1.8  Return in about 25 weeks (around 08/30/2022).    Orders Placed This Encounter  Procedures   DG Bone Density   MM 3D SCREEN BREAST BILATERAL   Comprehensive metabolic panel   Hemoglobin A1c   Lipid panel   TSH   CBC   B12   Magnesium   Vitamin D (25 hydroxy)    Meds ordered this encounter  Medications   atorvastatin (LIPITOR) 20 MG tablet    Sig: Take 1 tablet (20 mg total) by mouth daily.    Dispense:  90 tablet    Refill:  3   FLUoxetine (PROZAC) 40 MG capsule    Sig: Take 1 capsule (40 mg  total) by mouth daily.    Dispense:  90 capsule    Refill:  1   omeprazole (PRILOSEC) 40 MG capsule    Sig: Take 1 capsule (40 mg total) by mouth daily.    Dispense:  90 capsule    Refill:  1   telmisartan (MICARDIS) 80 MG tablet    Sig: Take 0.5 tablets (40 mg total) by mouth daily.    Dispense:  45 tablet    Refill:  1   zolpidem (AMBIEN) 5 MG tablet    Sig: Take 0.5-1 tablets (2.5-5 mg total) by mouth at bedtime.    Dispense:  90 tablet    Refill:  1    Referral Orders  No referral(s) requested today     Electronically signed by: Howard Pouch, [place]

## 2022-03-08 NOTE — Patient Instructions (Addendum)
Return in about 25 weeks (around 08/30/2022).        Great to see you today.  I have refilled the medication(s) we provide.   If labs were collected, we will inform you of lab results once received either by echart message or telephone call.   - echart message- for normal results that have been seen by the patient already.   - telephone call: abnormal results or if patient has not viewed results in their echart.

## 2022-03-20 ENCOUNTER — Telehealth: Payer: Self-pay | Admitting: Family Medicine

## 2022-03-20 ENCOUNTER — Other Ambulatory Visit: Payer: Self-pay

## 2022-03-20 DIAGNOSIS — M5416 Radiculopathy, lumbar region: Secondary | ICD-10-CM

## 2022-03-20 NOTE — Telephone Encounter (Signed)
Ordered placed and patient notified.

## 2022-03-20 NOTE — Telephone Encounter (Signed)
Pt seen end of January,  had injections. I little unsure of the follow up plan. Should she have fact injections before seeing [place] again? How long should she wait to schedule the facet inj?

## 2022-03-26 ENCOUNTER — Telehealth: Payer: Self-pay

## 2022-03-26 NOTE — Telephone Encounter (Signed)
Received order for bone density to be signed. Pt has appt on 03/27/21 @ 8 AM

## 2022-03-27 ENCOUNTER — Telehealth: Payer: Self-pay | Admitting: Family Medicine

## 2022-03-27 ENCOUNTER — Ambulatory Visit
Admission: RE | Admit: 2022-03-27 | Discharge: 2022-03-27 | Disposition: A | Discharge status: Home / Self Care | Payer: Medicare HMO | Source: Ambulatory Visit | Attending: Family Medicine | Admitting: Family Medicine

## 2022-03-27 DIAGNOSIS — M8580 Other specified disorders of bone density and structure, unspecified site: Secondary | ICD-10-CM

## 2022-03-27 DIAGNOSIS — M810 Age-related osteoporosis without current pathological fracture: Secondary | ICD-10-CM | POA: Diagnosis not present

## 2022-03-27 DIAGNOSIS — Z780 Asymptomatic menopausal state: Secondary | ICD-10-CM | POA: Diagnosis not present

## 2022-03-27 DIAGNOSIS — M85832 Other specified disorders of bone density and structure, left forearm: Secondary | ICD-10-CM | POA: Diagnosis not present

## 2022-03-27 NOTE — Telephone Encounter (Signed)
I have signed the order, but patient has an active order that we placed at the beginning of February for her to have her DEXA completed.

## 2022-03-27 NOTE — Telephone Encounter (Signed)
Please inform patient her bone density resulted with a decrease in density in comparison to last image score -2.7, was -1.8.  She is now in the osteoporosis range. Patients with osteoporosis or increase of her fracture risk.  She does meet criteria to start a medication called Fosamax once weekly which helps slow down the progression of osteoporosis and can help improve density at least some.  - If she would like to try this medication, I will call it in for her.  Units once weekly.  Must be taken on a empty stomach with a full glass of water.  Most common side effects if any are reflect symptoms.  Osteopenia/osteoporosis: 1.) Drink alcohol in moderation only.  2.) Decrease caffeine consumption to no  More than 2.5 cups  3.) Exercise: weight bearing (walking counts), strength and balance training. 4.) No smoking.  5.) Sunlight/Ultraviolet light exposure 30 minutes a day/5 days a week. 6.) Vitamin D supplement 800 iu a day (at least, more if told otherwise) and calcium 1000-1200 mg a day (total per her supplement and diet-as long as she has never been told to not take a calcium supplement )

## 2022-03-27 NOTE — Telephone Encounter (Signed)
faxed

## 2022-03-27 NOTE — Telephone Encounter (Signed)
Pt take 01-1998 unit capsule every other day and 2- 2000 units every other day. Will start taking chewable calcium daily

## 2022-04-02 ENCOUNTER — Telehealth: Payer: Self-pay | Admitting: Family Medicine

## 2022-04-02 NOTE — Telephone Encounter (Signed)
Contacted Rebecca Long to schedule their annual wellness visit. Appointment made for 04/04/2022.  [place] Direct Dial [phone]

## 2022-04-04 ENCOUNTER — Ambulatory Visit (INDEPENDENT_AMBULATORY_CARE_PROVIDER_SITE_OTHER): Payer: Medicare HMO

## 2022-04-04 VITALS — Wt 156.0 lb

## 2022-04-04 DIAGNOSIS — Z0000 Encounter for general adult medical examination without abnormal findings: Secondary | ICD-10-CM

## 2022-04-04 NOTE — Patient Instructions (Signed)
Rebecca Long , Thank you for taking time to come for your Medicare Wellness Visit. I appreciate your ongoing commitment to your health goals. Please review the following plan we discussed and let me know if I can assist you in the future.   These are the goals we discussed:  Goals      Patient Stated     Maintain current health     Patient Stated     Would like to lose some weight     Patient Stated     Lose about 30 lbs      Patient Stated     Lose weight         This is a list of the screening recommended for you and due dates:  Health Maintenance  Topic Date Due   Mammogram  06/06/2022   Medicare Annual Wellness Visit  04/04/2023   DEXA scan (bone density measurement)  03/27/2024   Colon Cancer Screening  07/26/2024   DTaP/Tdap/Td vaccine (3 - Td or Tdap) 10/28/2024   Pneumonia Vaccine  Completed   Flu Shot  Completed   Hepatitis C Screening: USPSTF Recommendation to screen - Ages 32-79 yo.  Completed   Zoster (Shingles) Vaccine  Completed   HPV Vaccine  Aged Out   COVID-19 Vaccine  Discontinued    Advanced directives: Please bring a copy of your health care power of attorney and living will to the office at your convenience.  Conditions/risks identified: lose some weight and get back to exercise  Next appointment: Follow up in one year for your annual wellness visit    Preventive Care 65 Years and Older, Female Preventive care refers to lifestyle choices and visits with your health care provider that can promote health and wellness. What does preventive care include? A yearly physical exam. This is also called an annual well check. Dental exams once or twice a year. Routine eye exams. Ask your health care provider how often you should have your eyes checked. Personal lifestyle choices, including: Daily care of your teeth and gums. Regular physical activity. Eating a healthy diet. Avoiding tobacco and drug use. Limiting alcohol use. Practicing safe sex. Taking  low-dose aspirin every day. Taking vitamin and mineral supplements as recommended by your health care provider. What happens during an annual well check? The services and screenings done by your health care provider during your annual well check will depend on your age, overall health, lifestyle risk factors, and family history of disease. Counseling  Your health care provider may ask you questions about your: Alcohol use. Tobacco use. Drug use. Emotional well-being. Home and relationship well-being. Sexual activity. Eating habits. History of falls. Memory and ability to understand (cognition). Work and work Statistician. Reproductive health. Screening  You may have the following tests or measurements: Height, weight, and BMI. Blood pressure. Lipid and cholesterol levels. These may be checked every 5 years, or more frequently if you are over 65 years old. Skin check. Lung cancer screening. You may have this screening every year starting at age 72 if you have a 30-pack-year history of smoking and currently smoke or have quit within the past 15 years. Fecal occult blood test (FOBT) of the stool. You may have this test every year starting at age 11. Flexible sigmoidoscopy or colonoscopy. You may have a sigmoidoscopy every 5 years or a colonoscopy every 10 years starting at age 2. Hepatitis C blood test. Hepatitis B blood test. Sexually transmitted disease (STD) testing. Diabetes screening. This is done  by checking your blood sugar (glucose) after you have not eaten for a while (fasting). You may have this done every 1-3 years. Bone density scan. This is done to screen for osteoporosis. You may have this done starting at age 68. Mammogram. This may be done every 1-2 years. Talk to your health care provider about how often you should have regular mammograms. Talk with your health care provider about your test results, treatment options, and if necessary, the need for more tests. Vaccines   Your health care provider may recommend certain vaccines, such as: Influenza vaccine. This is recommended every year. Tetanus, diphtheria, and acellular pertussis (Tdap, Td) vaccine. You may need a Td booster every 10 years. Zoster vaccine. You may need this after age 86. Pneumococcal 13-valent conjugate (PCV13) vaccine. One dose is recommended after age 29. Pneumococcal polysaccharide (PPSV23) vaccine. One dose is recommended after age 6. Talk to your health care provider about which screenings and vaccines you need and how often you need them. This information is not intended to replace advice given to you by your health care provider. Make sure you discuss any questions you have with your health care provider. Document Released: 02/11/2015 Document Revised: 10/05/2015 Document Reviewed: 11/16/2014 Elsevier Interactive Patient Education  2017 [employer] Prevention in the Home Falls can cause injuries. They can happen to people of all ages. There are many things you can do to make your home safe and to help prevent falls. What can I do on the outside of my home? Regularly fix the edges of walkways and driveways and fix any cracks. Remove anything that might make you trip as you walk through a door, such as a raised step or threshold. Trim any bushes or trees on the path to your home. Use bright outdoor lighting. Clear any walking paths of anything that might make someone trip, such as rocks or tools. Regularly check to see if handrails are loose or broken. Make sure that both sides of any steps have handrails. Any raised decks and porches should have guardrails on the edges. Have any leaves, snow, or ice cleared regularly. Use sand or salt on walking paths during winter. Clean up any spills in your garage right away. This includes oil or grease spills. What can I do in the bathroom? Use night lights. Install grab bars by the toilet and in the tub and shower. Do not use towel  bars as grab bars. Use non-skid mats or decals in the tub or shower. If you need to sit down in the shower, use a plastic, non-slip stool. Keep the floor dry. Clean up any water that spills on the floor as soon as it happens. Remove soap buildup in the tub or shower regularly. Attach bath mats securely with double-sided non-slip rug tape. Do not have throw rugs and other things on the floor that can make you trip. What can I do in the bedroom? Use night lights. Make sure that you have a light by your bed that is easy to reach. Do not use any sheets or blankets that are too big for your bed. They should not hang down onto the floor. Have a firm chair that has side arms. You can use this for support while you get dressed. Do not have throw rugs and other things on the floor that can make you trip. What can I do in the kitchen? Clean up any spills right away. Avoid walking on wet floors. Keep items that you use  a lot in easy-to-reach places. If you need to reach something above you, use a strong step stool that has a grab bar. Keep electrical cords out of the way. Do not use floor polish or wax that makes floors slippery. If you must use wax, use non-skid floor wax. Do not have throw rugs and other things on the floor that can make you trip. What can I do with my stairs? Do not leave any items on the stairs. Make sure that there are handrails on both sides of the stairs and use them. Fix handrails that are broken or loose. Make sure that handrails are as long as the stairways. Check any carpeting to make sure that it is firmly attached to the stairs. Fix any carpet that is loose or worn. Avoid having throw rugs at the top or bottom of the stairs. If you do have throw rugs, attach them to the floor with carpet tape. Make sure that you have a light switch at the top of the stairs and the bottom of the stairs. If you do not have them, ask someone to add them for you. What else can I do to help  prevent falls? Wear shoes that: Do not have high heels. Have rubber bottoms. Are comfortable and fit you well. Are closed at the toe. Do not wear sandals. If you use a stepladder: Make sure that it is fully opened. Do not climb a closed stepladder. Make sure that both sides of the stepladder are locked into place. Ask someone to hold it for you, if possible. Clearly mark and make sure that you can see: Any grab bars or handrails. First and last steps. Where the edge of each step is. Use tools that help you move around (mobility aids) if they are needed. These include: Canes. Walkers. Scooters. Crutches. Turn on the lights when you go into a dark area. Replace any light bulbs as soon as they burn out. Set up your furniture so you have a clear path. Avoid moving your furniture around. If any of your floors are uneven, fix them. If there are any pets around you, be aware of where they are. Review your medicines with your doctor. Some medicines can make you feel dizzy. This can increase your chance of falling. Ask your doctor what other things that you can do to help prevent falls. This information is not intended to replace advice given to you by your health care provider. Make sure you discuss any questions you have with your health care provider. Document Released: 11/11/2008 Document Revised: 06/23/2015 Document Reviewed: 02/19/2014 Elsevier Interactive Patient Education  2017 [employer].

## 2022-04-04 NOTE — Progress Notes (Signed)
I connected with  [name] on 04/04/22 by a audio enabled telemedicine application and verified that I am speaking with the correct person using two identifiers.  Patient Location: Home  Provider Location: Home Office  I discussed the limitations of evaluation and management by telemedicine. The patient expressed understanding and agreed to proceed.   Subjective:   Rebecca Long is a 72 y.o. female who presents for Medicare Annual (Subsequent) preventive examination.  Review of Systems     Cardiac Risk Factors include: advanced age (>57mn, >>82women);dyslipidemia;hypertension     Objective:    Today's Vitals   04/04/22 1033  Weight: 156 lb (70.8 kg)   Body mass index is 27.63 kg/m.     04/04/2022   10:41 AM 10/23/2021    8:13 AM 08/11/2021   12:32 PM 03/29/2021   10:15 AM 03/23/2020    9:51 AM 08/13/2018    8:32 AM 04/09/2017    2:23 PM  Advanced Directives  Does Patient Have a Medical Advance Directive? Yes Yes No Yes Yes Yes Yes  Type of [place]   Healthcare Power of [place]orewood Hills[place] will [place]empLiving will Living will;Healthcare Power of Attorney  Does patient want to make changes to medical advance directive?  No - Patient declined    No - Patient declined   Copy of HRocky Boy's Agencyin Chart? No - copy requested   No - copy requested  No - copy requested No - copy requested  Would patient like information on creating a medical advance directive?   No - Patient declined        Current Medications (verified) Outpatient Encounter Medications as of 04/04/2022  Medication Sig   atorvastatin (LIPITOR) 20 MG tablet Take 1 tablet (20 mg total) by mouth daily.   Calcium Carbonate-Vit D-Min (CALCIUM 1200 PO) Take by mouth.   Cholecalciferol (VITAMIN D3) 10 MCG (400 UNIT) CAPS SMARTSIG:1 Capsule(s) By Mouth   Cyanocobalamin (VITAMIN B 12 PO) Take by mouth.   FIBER PO  Take by mouth.   FLUoxetine (PROZAC) 40 MG capsule Take 1 capsule (40 mg total) by mouth daily.   omeprazole (PRILOSEC) 40 MG capsule Take 1 capsule (40 mg total) by mouth daily.   telmisartan (MICARDIS) 80 MG tablet Take 0.5 tablets (40 mg total) by mouth daily.   valACYclovir (VALTREX) 500 MG tablet Take 500 mg by mouth as needed.    zolpidem (AMBIEN) 5 MG tablet Take 0.5-1 tablets (2.5-5 mg total) by mouth at bedtime.   No facility-administered encounter medications on file as of 04/04/2022.    Allergies (verified) Latex and Polysporin [bacitracin-polymyxin b]   History: Past Medical History:  Diagnosis Date   Anxiety    Cavus foot, acquired 09/09/2012    bilateral cavus feet the one on the left shows that she has some probable neurogenic weakness with abnormal curvature and in inversion contracture Podiatry 06/2015: H&P and x-ray reviewed with patient. Today I went ahead and I did a proximal nerve block I was able to aspirate the second MPJ and got out amount of clear fluid and injected with a quarter cc dexamethasone Kenalog and ap   DDD (degenerative disc disease), lumbar    Depression    Endometrial polyp    GERD (gastroesophageal reflux disease)    Hiatal hernia    Hip flexor tendinitis, right 07/02/2016   History of adenomatous polyp of colon    History of  esophageal stricture    History of gastric polyp 11/2017   History of PCR DNA positive for HSV2 11/12/2016   History of primary hyperparathyroidism    s/p  left parathryoidectomy 09-17-2008-- resolved   Hyperlipidemia    Hypertension    Insomnia    Lumbar stenosis    Osteopenia    Schatzki's ring    Scoliosis    Tendinopathy of right biceps tendon 07/02/2016   Thickened endometrium    Uterine fibroid    UTI (urinary tract infection) due to Enterococcus 01/05/2020   Wears glasses    Past Surgical History:  Procedure Laterality Date   BREAST REDUCTION SURGERY Bilateral 2000   COLONOSCOPY     CYST EXCISION  1985    head and rt axilla   DILATATION & CURETTAGE/HYSTEROSCOPY WITH MYOSURE N/A 08/13/2018   Procedure: DILATATION & CURETTAGE/HYSTEROSCOPY WITH MYOSURE;  Surgeon: Princess Bruins, MD;  Location: [place];  Service: Gynecology;  Laterality: N/A;  requests 10:15am in [place] G[facility] block request 30 minutes   DILATION AND CURETTAGE OF UTERUS N/A 08/13/2018   ECTOPIC PREGNANCY SURGERY  1981   PARATHYROIDECTOMY Left 09/17/2008   dr Ronnald Collum  '@[facility]'$    left superior  (adenoma)   SHOULDER SURGERY Right 2007   "frozen"   UPPER GASTROINTESTINAL ENDOSCOPY  last one 12-18-2017   dr Rush Landmark   Family History  Problem Relation Age of Onset   Heart disease Father 36   Breast cancer Mother    Hypertension Brother    Hypertension Sister    Breast cancer Maternal Aunt    Colon cancer Neg Hx    Esophageal cancer Neg Hx    Stomach cancer Neg Hx    Rectal cancer Neg Hx    Colon polyps Neg Hx    Social History   Socioeconomic History   Marital status: Married    Spouse name: Not on file   Number of children: 1   Years of education: Not on file   Highest education level: Not on file  Occupational History   Not on file  Tobacco Use   Smoking status: Former    Years: 15.00    Types: Cigarettes    Quit date: 01/30/1991    Years since quitting: 31.1   Smokeless tobacco: Never  Vaping Use   Vaping Use: Never used  Substance and Sexual Activity   Alcohol use: Not Currently   Drug use: Never   Sexual activity: Not Currently    Birth control/protection: Post-menopausal    Comment: 1st intercourse- 17, partners- 60, married- 53 yrs   Other Topics Concern   Not on file  Social History Narrative   Lives with husband. Feels safe at home. 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 [employer]. Some college. Former smoker. Wears seat belt.    Social Determinants of Health   Financial Resource Strain: Low Risk  (04/04/2022)   Overall Financial Resource Strain (CARDIA)    Difficulty of  Paying Living Expenses: Not hard at all  Food Insecurity: No Food Insecurity (04/04/2022)   Hunger Vital Sign    Worried About Running Out of Food in the Last Year: Never true    Ran Out of Food in the Last Year: Never true  Transportation Needs: No Transportation Needs (04/04/2022)   PRAPARE - Hydrologist (Medical): No    Lack of Transportation (Non-Medical): No  Physical Activity: Inactive (04/04/2022)   Exercise Vital Sign    Days of Exercise per  Week: 0 days    Minutes of Exercise per Session: 0 min  Stress: No Stress Concern Present (04/04/2022)   Ali Molina    Feeling of Stress : Not at all  Social Connections: Moderately Integrated (04/04/2022)   Social Connection and Isolation Panel [NHANES]    Frequency of Communication with Friends and Family: More than three times a week    Frequency of Social Gatherings with Friends and Family: More than three times a week    Attends Religious Services: Never    Marine scientist or Organizations: Yes    Attends Music therapist: 1 to 4 times per year    Marital Status: Married    Tobacco Counseling Counseling given: Not Answered   Clinical Intake:  Pre-visit preparation completed: Yes  Pain : No/denies pain     BMI - recorded: 27.63 Nutritional Status: BMI 25 -29 Overweight Nutritional Risks: None Diabetes: No  How often do you need to have someone help you when you read instructions, pamphlets, or other written materials from your doctor or pharmacy?: 1 - Never  Diabetic?no  Interpreter Needed?: No  Information entered by :: Charlott Rakes, LPN   Activities of Daily Living    04/04/2022   10:42 AM  In your present state of health, do you have any difficulty performing the following activities:  Hearing? 0  Vision? 0  Difficulty concentrating or making decisions? 0  Walking or climbing stairs? 1  Comment with  injury  Dressing or bathing? 0  Doing errands, shopping? 0  Preparing Food and eating ? N  Using the Toilet? N  In the past six months, have you accidently leaked urine? Y  Comment wears a pad  Do you have problems with loss of bowel control? Y  Comment wears a pad  Managing your Medications? N  Managing your Finances? N  Housekeeping or managing your Housekeeping? N    Patient Care Team: Ma Hillock, DO as PCP - General (Family Medicine) Regal, Tamala Fothergill, DPM as Consulting Physician (Podiatry) Sydnee Levans, MD as Consulting Physician (Dermatology) Stefanie Libel, MD as Consulting Physician (Sports Medicine) Ladene Artist, MD as Consulting Physician (Gastroenterology) Rutherford Guys, MD as Consulting Physician (Ophthalmology) Princess Bruins, MD as Consulting Physician (Obstetrics and Gynecology) Eino Farber, PA-C as Physician Assistant (Psychiatry)  Indicate any recent Medical Services you may have received from other than [facility] providers in the past year (date may be approximate).     Assessment:   This is a routine wellness examination for Embry.  Hearing/Vision screen Hearing Screening - Comments:: Pt denies any hearing issues  Vision Screening - Comments:: Pt follows up with [facility] for annual eye exams   Dietary issues and exercise activities discussed: Current Exercise Habits: The patient does not participate in regular exercise at present   Goals Addressed             This Visit's Progress    Patient Stated       Lose weight        Depression Screen    04/04/2022   10:39 AM 03/08/2022   10:13 AM 07/05/2021    1:17 PM 03/29/2021   10:13 AM 11/29/2020    9:31 AM 03/23/2020    9:54 AM 01/06/2020    8:35 AM  PHQ 2/9 Scores  PHQ - 2 Score 0 '2 4 1 2 1 '$ 0  PHQ- 9 Score 0 '7 14 2 '$ 9  Fall Risk    04/04/2022   10:41 AM 03/29/2021   10:16 AM 11/29/2020    9:26 AM 03/23/2020    9:53 AM 01/06/2020    8:35 AM  Fall Risk   Falls in the past year? 1 0  1 1 0  Number falls in past yr: 1 0 1 0 0  Injury with Fall? 0 0 0 1 0  Risk for fall due to : Impaired vision;Impaired balance/gait;Impaired mobility Impaired vision History of fall(s) History of fall(s)   Follow up Falls prevention discussed Falls prevention discussed Falls evaluation completed Falls prevention discussed Falls evaluation completed    FALL RISK PREVENTION PERTAINING TO THE HOME:  Any stairs in or around the home? Yes  If so, are there any without handrails? No  Home free of loose throw rugs in walkways, pet beds, electrical cords, etc? Yes  Adequate lighting in your home to reduce risk of falls? Yes   ASSISTIVE DEVICES UTILIZED TO PREVENT FALLS:  Life alert? No  Use of a cane, walker or w/c? Yes  Grab bars in the bathroom? Yes  Shower chair or bench in shower? Yes  Elevated toilet seat or a handicapped toilet? No   TIMED UP AND GO:  Was the test performed? No .   Cognitive Function:    04/09/2017    2:27 PM  MMSE - Mini Mental State Exam  Orientation to time 5  Orientation to Place 5  Registration 3  Attention/ Calculation 5  Recall 3  Language- name 2 objects 2  Language- repeat 1  Language- follow 3 step command 3  Language- read & follow direction 1  Write a sentence 1  Copy design 1  Total score 30        04/04/2022   10:43 AM 03/29/2021   10:19 AM  6CIT Screen  What Year? 0 points 0 points  What month? 0 points 0 points  What time? 0 points 0 points  Count back from 20 0 points 0 points  Months in reverse 0 points 0 points  Repeat phrase 0 points 0 points  Total Score 0 points 0 points    Immunizations Immunization History  Administered Date(s) Administered   Hepatitis A, Adult 11/30/2016   Influenza, High Dose Seasonal PF 10/18/2015, 11/12/2016, 10/18/2017, 09/15/2018, 10/11/2020, 11/07/2021   Influenza,inj,Quad PF,6+ Mos 10/15/2012, 10/06/2014   Influenza-Unspecified 10/15/2012, 10/14/2019   PFIZER Comirnaty(Gray Top)Covid-19  Tri-Sucrose Vaccine 11/07/2021   PFIZER(Purple Top)SARS-COV-2 Vaccination 02/19/2019, 03/12/2019, 10/12/2019, 04/27/2020   Pfizer Covid-19 Vaccine Bivalent Booster 51yr & up 10/11/2020, 05/23/2021   Pneumococcal Conjugate-13 10/29/2014   Pneumococcal Polysaccharide-23 11/01/2015   Respiratory Syncytial Virus Vaccine,Recomb Aduvanted(Arexvy) 11/23/2021   Td 10/24/2004   Tdap 10/29/2014   Zoster Recombinat (Shingrix) 04/11/2017, 07/25/2017   Zoster, Live 01/30/2007    TDAP status: Up to date  Flu Vaccine status: Up to date  Pneumococcal vaccine status: Up to date  Covid-19 vaccine status: Completed vaccines  Qualifies for Shingles Vaccine? Yes   Zostavax completed Yes   Shingrix Completed?: Yes  Screening Tests Health Maintenance  Topic Date Due   MAMMOGRAM  06/06/2022   Medicare Annual Wellness (AWV)  04/04/2023   DEXA SCAN  03/27/2024   COLONOSCOPY (Pts 45-462yrInsurance coverage will need to be confirmed)  07/26/2024   DTaP/Tdap/Td (3 - Td or Tdap) 10/28/2024   Pneumonia Vaccine 6575Years old  Completed   INFLUENZA VACCINE  Completed   Hepatitis C Screening  Completed   Zoster Vaccines- Shingrix  Completed   HPV VACCINES  Aged Out   COVID-19 Vaccine  Discontinued    Health Maintenance  There are no preventive care reminders to display for this patient.   Colorectal cancer screening: Type of screening: Colonoscopy. Completed 07/26/21. Repeat every 3 years  Mammogram status: Completed 06/05/21. Repeat every year  Bone Density status: Completed 03/27/22. Results reflect: Bone density results: OSTEOPOROSIS. Repeat every 2 years.  Additional Screening:  Hepatitis C Screening:  Completed 11/10/15  Vision Screening: Recommended annual ophthalmology exams for early detection of glaucoma and other disorders of the eye. Is the patient up to date with their annual eye exam?  Yes  Who is the provider or what is the name of the office in which the patient attends annual eye  exams? [facility] If pt is not established with a provider, would they like to be referred to a provider to establish care? No .   Dental Screening: Recommended annual dental exams for proper oral hygiene  Community Resource Referral / Chronic Care Management: CRR required this visit?  No   CCM required this visit?  No      Plan:     I have personally reviewed and noted the following in the patient's chart:   Medical and social history Use of alcohol, tobacco or illicit drugs  Current medications and supplements including opioid prescriptions. Patient is not currently taking opioid prescriptions. Functional ability and status Nutritional status Physical activity Advanced directives List of other physicians Hospitalizations, surgeries, and ER visits in previous 12 months Vitals Screenings to include cognitive, depression, and falls Referrals and appointments  In addition, I have reviewed and discussed with patient certain preventive protocols, quality metrics, and best practice recommendations. A written personalized care plan for preventive services as well as general preventive health recommendations were provided to patient.     Willette Brace, LPN   [id number]   Nurse Notes: none

## 2022-04-05 ENCOUNTER — Ambulatory Visit
Admission: RE | Admit: 2022-04-05 | Discharge: 2022-04-05 | Disposition: A | Discharge status: Home / Self Care | Payer: Medicare HMO | Source: Ambulatory Visit | Attending: Family Medicine | Admitting: Family Medicine

## 2022-04-05 DIAGNOSIS — M4800 Spinal stenosis, site unspecified: Secondary | ICD-10-CM | POA: Diagnosis not present

## 2022-04-05 DIAGNOSIS — R69 Illness, unspecified: Secondary | ICD-10-CM | POA: Diagnosis not present

## 2022-04-05 DIAGNOSIS — M810 Age-related osteoporosis without current pathological fracture: Secondary | ICD-10-CM | POA: Diagnosis not present

## 2022-04-05 DIAGNOSIS — N393 Stress incontinence (female) (male): Secondary | ICD-10-CM | POA: Diagnosis not present

## 2022-04-05 DIAGNOSIS — M792 Neuralgia and neuritis, unspecified: Secondary | ICD-10-CM | POA: Diagnosis not present

## 2022-04-05 DIAGNOSIS — M5416 Radiculopathy, lumbar region: Secondary | ICD-10-CM

## 2022-04-05 DIAGNOSIS — B91 Sequelae of poliomyelitis: Secondary | ICD-10-CM | POA: Diagnosis not present

## 2022-04-05 DIAGNOSIS — G4700 Insomnia, unspecified: Secondary | ICD-10-CM | POA: Diagnosis not present

## 2022-04-05 DIAGNOSIS — M4727 Other spondylosis with radiculopathy, lumbosacral region: Secondary | ICD-10-CM | POA: Diagnosis not present

## 2022-04-05 DIAGNOSIS — E785 Hyperlipidemia, unspecified: Secondary | ICD-10-CM | POA: Diagnosis not present

## 2022-04-05 DIAGNOSIS — I10 Essential (primary) hypertension: Secondary | ICD-10-CM | POA: Diagnosis not present

## 2022-04-05 DIAGNOSIS — K219 Gastro-esophageal reflux disease without esophagitis: Secondary | ICD-10-CM | POA: Diagnosis not present

## 2022-04-05 DIAGNOSIS — M1990 Unspecified osteoarthritis, unspecified site: Secondary | ICD-10-CM | POA: Diagnosis not present

## 2022-04-05 LAB — HM AWV

## 2022-04-05 MED ORDER — METHYLPREDNISOLONE ACETATE 40 MG/ML INJ SUSP (RADIOLOG
80.0000 mg | Freq: Once | INTRAMUSCULAR | Status: AC
  Administered 2022-04-05: 80 mg via EPIDURAL

## 2022-04-05 MED ORDER — IOPAMIDOL (ISOVUE-M 200) INJECTION 41%
1.0000 mL | Freq: Once | INTRAMUSCULAR | Status: AC
  Administered 2022-04-05: 1 mL via EPIDURAL

## 2022-04-05 NOTE — Discharge Instructions (Signed)

## 2022-04-09 DIAGNOSIS — H2512 Age-related nuclear cataract, left eye: Secondary | ICD-10-CM | POA: Diagnosis not present

## 2022-04-09 DIAGNOSIS — H2511 Age-related nuclear cataract, right eye: Secondary | ICD-10-CM | POA: Diagnosis not present

## 2022-04-12 DIAGNOSIS — D2262 Melanocytic nevi of left upper limb, including shoulder: Secondary | ICD-10-CM | POA: Diagnosis not present

## 2022-04-12 DIAGNOSIS — L400 Psoriasis vulgaris: Secondary | ICD-10-CM | POA: Diagnosis not present

## 2022-04-12 DIAGNOSIS — L821 Other seborrheic keratosis: Secondary | ICD-10-CM | POA: Diagnosis not present

## 2022-04-12 DIAGNOSIS — I788 Other diseases of capillaries: Secondary | ICD-10-CM | POA: Diagnosis not present

## 2022-04-12 DIAGNOSIS — D225 Melanocytic nevi of trunk: Secondary | ICD-10-CM | POA: Diagnosis not present

## 2022-04-12 DIAGNOSIS — Z85828 Personal history of other malignant neoplasm of skin: Secondary | ICD-10-CM | POA: Diagnosis not present

## 2022-04-16 NOTE — Progress Notes (Unsigned)
[facility] [place] Phone: [phone] Subjective:   Rebecca Long, am serving as a scribe for Dr. Hulan Saas.  I'm seeing this patient by the request  of:  Kuneff, Renee A, DO  CC: Right-sided hip pain and back pain  [id number]  02/26/2022 Patient given injection and tolerated the procedure well with some mild improvement in gait noted.  Discussed icing regimen and home exercises, which activities to do and which ones to avoid.  Increase activity slowly otherwise.  Follow-up again in 6 to 8 weeks     Patient has had some difficulty for quite some time. I believe the patient is having more facet arthritis and we will order an facet joint injection on the right L4-L5 area. This will be ordered today. Patient given a right sacroiliac injection and we will see if this makes any significant improvement. Still concerned with some of the weakness that patient is having and we will get a repeat x-ray of the hip to make sure there is Long occult fracture that could also be contributing. Follow-up with me again in 6 to 8 weeks   Update 04/17/2022 Rebecca Long is a 72 y.o. female coming in with complaint of SI joint pain. Patient states that she is doing better. Able to walk around her neighborhood 3x this week. Also having R hip pain over GT.        Past Medical History:  Diagnosis Date   Anxiety    Cavus foot, acquired 09/09/2012    bilateral cavus feet the one on the left shows that she has some probable neurogenic weakness with abnormal curvature and in inversion contracture Podiatry 06/2015: H&P and x-ray reviewed with patient. Today I went ahead and I did a proximal nerve block I was able to aspirate the second MPJ and got out amount of clear fluid and injected with a quarter cc dexamethasone Kenalog and ap   DDD (degenerative disc disease), lumbar    Depression    Endometrial polyp    GERD (gastroesophageal reflux  disease)    Hiatal hernia    Hip flexor tendinitis, right 07/02/2016   History of adenomatous polyp of colon    History of esophageal stricture    History of gastric polyp 11/2017   History of PCR DNA positive for HSV2 11/12/2016   History of primary hyperparathyroidism    s/p  left parathryoidectomy 09-17-2008-- resolved   Hyperlipidemia    Hypertension    Insomnia    Lumbar stenosis    Osteopenia    Schatzki's ring    Scoliosis    Tendinopathy of right biceps tendon 07/02/2016   Thickened endometrium    Uterine fibroid    UTI (urinary tract infection) due to Enterococcus 01/05/2020   Wears glasses    Past Surgical History:  Procedure Laterality Date   BREAST REDUCTION SURGERY Bilateral 2000   COLONOSCOPY     CYST EXCISION  1985   head and rt axilla   DILATATION & CURETTAGE/HYSTEROSCOPY WITH MYOSURE N/A 08/13/2018   Procedure: DILATATION & CURETTAGE/HYSTEROSCOPY WITH MYOSURE;  Surgeon: Princess Bruins, MD;  Location: [facility];  Service: Gynecology;  Laterality: N/A;  requests 10:15am in [place] Gyn block request 30 minutes   DILATION AND CURETTAGE OF UTERUS N/A 08/13/2018   ECTOPIC PREGNANCY SURGERY  1981   PARATHYROIDECTOMY Left 09/17/2008   dr Ronnald Collum  @[facility]    left superior  (adenoma)   SHOULDER SURGERY Right  2007   "frozen"   UPPER GASTROINTESTINAL ENDOSCOPY  last one 12-18-2017   dr Rush Landmark   Social History   Socioeconomic History   Marital status: Married    Spouse name: Not on file   Number of children: 1   Years of education: Not on file   Highest education level: Not on file  Occupational History   Not on file  Tobacco Use   Smoking status: Former    Years: 15    Types: Cigarettes    Quit date: 01/30/1991    Years since quitting: 31.2   Smokeless tobacco: Never  Vaping Use   Vaping Use: Never used  Substance and Sexual Activity   Alcohol use: Not Currently   Drug use: Never   Sexual activity: Not Currently    Birth  control/protection: Post-menopausal    Comment: 1st intercourse- 17, partners- 52, married- 73 yrs   Other Topics Concern   Not on file  Social History Narrative   Lives with husband. Feels safe at home. 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 [employer]. Some college. Former smoker. Wears seat belt.    Social Determinants of Health   Financial Resource Strain: Low Risk  (04/04/2022)   Overall Financial Resource Strain (CARDIA)    Difficulty of Paying Living Expenses: Not hard at all  Food Insecurity: Long Food Insecurity (04/04/2022)   Hunger Vital Sign    Worried About Running Out of Food in the Last Year: Never true    Ran Out of Food in the Last Year: Never true  Transportation Needs: Long Transportation Needs (04/04/2022)   PRAPARE - Hydrologist (Medical): Long    Lack of Transportation (Non-Medical): Long  Physical Activity: Inactive (04/04/2022)   Exercise Vital Sign    Days of Exercise per Week: 0 days    Minutes of Exercise per Session: 0 min  Stress: Long Stress Concern Present (04/04/2022)   [place]    Feeling of Stress : Not at all  Social Connections: Moderately Integrated (04/04/2022)   Social Connection and Isolation Panel [NHANES]    Frequency of Communication with Friends and Family: More than three times a week    Frequency of Social Gatherings with Friends and Family: More than three times a week    Attends Religious Services: Never    Marine scientist or Organizations: Yes    Attends Music therapist: 1 to 4 times per year    Marital Status: Married   Allergies  Allergen Reactions   Latex Itching and Rash   Polysporin [Bacitracin-Polymyxin B] Rash   Family History  Problem Relation Age of Onset   Heart disease Father 41   Breast cancer Mother    Hypertension Brother    Hypertension Sister    Breast cancer Maternal Aunt    Colon cancer Neg Hx    Esophageal  cancer Neg Hx    Stomach cancer Neg Hx    Rectal cancer Neg Hx    Colon polyps Neg Hx      Current Outpatient Medications (Cardiovascular):    atorvastatin (LIPITOR) 20 MG tablet, Take 1 tablet (20 mg total) by mouth daily.   telmisartan (MICARDIS) 80 MG tablet, Take 0.5 tablets (40 mg total) by mouth daily.    Current Outpatient Medications (Hematological):    Cyanocobalamin (VITAMIN B 12 PO), Take by mouth.  Current Outpatient Medications (Other):    Calcium Carbonate-Vit D-Min (CALCIUM  1200 PO), Take by mouth.   Cholecalciferol (VITAMIN D3) 10 MCG (400 UNIT) CAPS, SMARTSIG:1 Capsule(s) By Mouth   FIBER PO, Take by mouth.   FLUoxetine (PROZAC) 40 MG capsule, Take 1 capsule (40 mg total) by mouth daily.   omeprazole (PRILOSEC) 40 MG capsule, Take 1 capsule (40 mg total) by mouth daily.   valACYclovir (VALTREX) 500 MG tablet, Take 500 mg by mouth as needed.    zolpidem (AMBIEN) 5 MG tablet, Take 0.5-1 tablets (2.5-5 mg total) by mouth at bedtime.   Reviewed prior external information including notes and imaging from  primary care provider As well as notes that were available from care everywhere and other healthcare systems.  Past medical history, social, surgical and family history all reviewed in electronic medical record.  Long pertanent information unless stated regarding to the chief complaint.   Review of Systems:  Long headache, visual changes, nausea, vomiting, diarrhea, constipation, dizziness, abdominal pain, skin rash, fevers, chills, night sweats, weight loss, swollen lymph nodes, body aches, joint swelling, chest pain, shortness of breath, mood changes. POSITIVE muscle aches  Objective  Blood pressure 112/78, pulse (!) 59, height 5\' 3"  (1.6 m), SpO2 94 %.   General: Long apparent distress alert and oriented x3 mood and affect normal, dressed appropriately.  HEENT: Pupils equal, extraocular movements intact  Respiratory: Patient's speak in full sentences and does not  appear short of breath  Cardiovascular: Long lower extremity edema, non tender, Long erythema  Antalgic gait noted.  The patient's low back does have significant degenerative scoliosis noted.  Only can get to 5 degrees of flexion of the back and cannot extend any more than that.  Patient is tender to palpation in the paraspinal musculature but severely tender over the right greater trochanteric area.  Mild limited range of motion of both hips.   After verbal consent patient was prepped with alcohol swab and with a 21-gauge 2 inch needle injected into the right greater trochanteric area with 2 cc of 0.5% Marcaine and 1 cc of Kenalog 40 mg/mL.  Long blood loss.  Band-Aid placed.  Postinjection instructions given    Impression and Recommendations:      The above documentation has been reviewed and is accurate and complete Lyndal Pulley, DO

## 2022-04-17 ENCOUNTER — Ambulatory Visit: Payer: Medicare HMO | Admitting: Family Medicine

## 2022-04-17 ENCOUNTER — Encounter: Payer: Self-pay | Admitting: Family Medicine

## 2022-04-17 VITALS — BP 112/78 | HR 59 | Ht 63.0 in

## 2022-04-17 DIAGNOSIS — M7061 Trochanteric bursitis, right hip: Secondary | ICD-10-CM

## 2022-04-17 NOTE — Patient Instructions (Addendum)
Injected GT today

## 2022-04-17 NOTE — Assessment & Plan Note (Signed)
Injection given today and tolerated the procedure well, discussed icing regimen and home exercises, discussed which activities to do and which ones to avoid.  Hopeful that this will make a difference while she is traveling.  Follow-up again in 6 to 8 weeks.  Discussed with patient having the most benefit from the epidural we should consider this again at a later date when she is back in town if necessary.  Patient is in agreement with the plan.  Total time reviewing patient's imaging, outside notes and discussing with patient 32 minutes

## 2022-06-05 ENCOUNTER — Ambulatory Visit: Payer: Medicare HMO

## 2022-06-14 ENCOUNTER — Ambulatory Visit
Admission: RE | Admit: 2022-06-14 | Discharge: 2022-06-14 | Disposition: A | Discharge status: Home / Self Care | Payer: Medicare HMO | Source: Ambulatory Visit | Attending: Family Medicine | Admitting: Family Medicine

## 2022-06-14 DIAGNOSIS — Z1231 Encounter for screening mammogram for malignant neoplasm of breast: Secondary | ICD-10-CM

## 2022-07-02 ENCOUNTER — Telehealth: Payer: Self-pay | Admitting: Family Medicine

## 2022-07-02 ENCOUNTER — Other Ambulatory Visit: Payer: Self-pay

## 2022-07-02 DIAGNOSIS — M7061 Trochanteric bursitis, right hip: Secondary | ICD-10-CM

## 2022-07-02 DIAGNOSIS — M5136 Other intervertebral disc degeneration, lumbar region: Secondary | ICD-10-CM

## 2022-07-02 NOTE — Telephone Encounter (Signed)
Referral sent to [facility].

## 2022-07-02 NOTE — Telephone Encounter (Signed)
Patient would like to be referred to physical therapy for her back and hip. She has done shots/acupuncture and not feeling better. Concerned as she had an upcoming trip to [place] and wants to be able to walk.  Prefers [facility] if possible.

## 2022-07-16 DIAGNOSIS — H2512 Age-related nuclear cataract, left eye: Secondary | ICD-10-CM | POA: Diagnosis not present

## 2022-07-17 DIAGNOSIS — M25551 Pain in right hip: Secondary | ICD-10-CM | POA: Diagnosis not present

## 2022-07-17 DIAGNOSIS — M545 Low back pain, unspecified: Secondary | ICD-10-CM | POA: Diagnosis not present

## 2022-07-17 DIAGNOSIS — M5459 Other low back pain: Secondary | ICD-10-CM | POA: Diagnosis not present

## 2022-07-17 DIAGNOSIS — R2689 Other abnormalities of gait and mobility: Secondary | ICD-10-CM | POA: Diagnosis not present

## 2022-07-24 DIAGNOSIS — M25551 Pain in right hip: Secondary | ICD-10-CM | POA: Diagnosis not present

## 2022-07-24 DIAGNOSIS — M5459 Other low back pain: Secondary | ICD-10-CM | POA: Diagnosis not present

## 2022-07-24 DIAGNOSIS — R2689 Other abnormalities of gait and mobility: Secondary | ICD-10-CM | POA: Diagnosis not present

## 2022-07-24 DIAGNOSIS — M545 Low back pain, unspecified: Secondary | ICD-10-CM | POA: Diagnosis not present

## 2022-08-01 DIAGNOSIS — M545 Low back pain, unspecified: Secondary | ICD-10-CM | POA: Diagnosis not present

## 2022-08-01 DIAGNOSIS — M25551 Pain in right hip: Secondary | ICD-10-CM | POA: Diagnosis not present

## 2022-08-01 DIAGNOSIS — M5459 Other low back pain: Secondary | ICD-10-CM | POA: Diagnosis not present

## 2022-08-01 DIAGNOSIS — R2689 Other abnormalities of gait and mobility: Secondary | ICD-10-CM | POA: Diagnosis not present

## 2022-08-22 DIAGNOSIS — M5459 Other low back pain: Secondary | ICD-10-CM | POA: Diagnosis not present

## 2022-08-22 DIAGNOSIS — R2689 Other abnormalities of gait and mobility: Secondary | ICD-10-CM | POA: Diagnosis not present

## 2022-08-22 DIAGNOSIS — M25551 Pain in right hip: Secondary | ICD-10-CM | POA: Diagnosis not present

## 2022-08-22 DIAGNOSIS — M545 Low back pain, unspecified: Secondary | ICD-10-CM | POA: Diagnosis not present

## 2022-08-25 ENCOUNTER — Other Ambulatory Visit: Payer: Self-pay | Admitting: Family Medicine

## 2022-08-29 DIAGNOSIS — M545 Low back pain, unspecified: Secondary | ICD-10-CM | POA: Diagnosis not present

## 2022-08-29 DIAGNOSIS — M25551 Pain in right hip: Secondary | ICD-10-CM | POA: Diagnosis not present

## 2022-08-29 DIAGNOSIS — R2689 Other abnormalities of gait and mobility: Secondary | ICD-10-CM | POA: Diagnosis not present

## 2022-08-29 DIAGNOSIS — M5459 Other low back pain: Secondary | ICD-10-CM | POA: Diagnosis not present

## 2022-08-30 ENCOUNTER — Ambulatory Visit: Payer: Medicare HMO | Admitting: Family Medicine

## 2022-09-01 ENCOUNTER — Other Ambulatory Visit: Payer: Self-pay | Admitting: Family Medicine

## 2022-09-03 ENCOUNTER — Encounter: Payer: Self-pay | Admitting: Family Medicine

## 2022-09-03 ENCOUNTER — Ambulatory Visit (INDEPENDENT_AMBULATORY_CARE_PROVIDER_SITE_OTHER): Payer: Medicare HMO | Admitting: Family Medicine

## 2022-09-03 VITALS — BP 127/81 | HR 64 | Temp 98.2°F | Wt 159.4 lb

## 2022-09-03 DIAGNOSIS — R2689 Other abnormalities of gait and mobility: Secondary | ICD-10-CM | POA: Diagnosis not present

## 2022-09-03 DIAGNOSIS — R2681 Unsteadiness on feet: Secondary | ICD-10-CM

## 2022-09-03 DIAGNOSIS — E782 Mixed hyperlipidemia: Secondary | ICD-10-CM | POA: Diagnosis not present

## 2022-09-03 DIAGNOSIS — M25551 Pain in right hip: Secondary | ICD-10-CM | POA: Diagnosis not present

## 2022-09-03 DIAGNOSIS — K2100 Gastro-esophageal reflux disease with esophagitis, without bleeding: Secondary | ICD-10-CM | POA: Diagnosis not present

## 2022-09-03 DIAGNOSIS — I10 Essential (primary) hypertension: Secondary | ICD-10-CM

## 2022-09-03 DIAGNOSIS — F418 Other specified anxiety disorders: Secondary | ICD-10-CM

## 2022-09-03 DIAGNOSIS — M5459 Other low back pain: Secondary | ICD-10-CM | POA: Diagnosis not present

## 2022-09-03 DIAGNOSIS — M48062 Spinal stenosis, lumbar region with neurogenic claudication: Secondary | ICD-10-CM | POA: Diagnosis not present

## 2022-09-03 DIAGNOSIS — G479 Sleep disorder, unspecified: Secondary | ICD-10-CM

## 2022-09-03 DIAGNOSIS — M7061 Trochanteric bursitis, right hip: Secondary | ICD-10-CM

## 2022-09-03 DIAGNOSIS — M545 Low back pain, unspecified: Secondary | ICD-10-CM | POA: Diagnosis not present

## 2022-09-03 MED ORDER — FLUOXETINE HCL 40 MG PO CAPS: 40.0000 mg | ORAL_CAPSULE | Freq: Every day | ORAL | 1 refills | Status: DC

## 2022-09-03 MED ORDER — ZOLPIDEM TARTRATE 5 MG PO TABS: 2.5000 mg | ORAL_TABLET | Freq: Every day | ORAL | 1 refills | Status: DC

## 2022-09-03 MED ORDER — TELMISARTAN 80 MG PO TABS: 40.0000 mg | ORAL_TABLET | Freq: Every day | ORAL | 1 refills | Status: DC

## 2022-09-03 MED ORDER — OMEPRAZOLE 40 MG PO CPDR: 40.0000 mg | DELAYED_RELEASE_CAPSULE | Freq: Every day | ORAL | 1 refills | Status: DC

## 2022-09-03 NOTE — Progress Notes (Signed)
Patient ID: Rebecca Long, female  DOB: December 09, 1950, 72 y.o.   MRN: [id number] Patient Care Team    Relationship Specialty Notifications Start End  Natalia Leatherwood, DO PCP - General Family Medicine  10/06/14   Lenn Sink, [place] Consulting Physician Podiatry  10/18/15   Bufford Buttner, MD Consulting Physician Dermatology  10/18/15   Enid Baas, MD Consulting Physician Sports Medicine  11/01/15   Meryl Dare, MD Consulting Physician Gastroenterology  11/04/17   Jethro Bolus, MD Consulting Physician Ophthalmology  06/17/18   Genia Del, MD Consulting Physician Obstetrics and Gynecology  06/17/18   Tamela Oddi, PA-C Physician Assistant Psychiatry  06/17/18    Comment: [facility]    Chief Complaint  Patient presents with   Hypertension    Subjective: Rebecca Long is a 72 y.o.  Female  present for Chronic Conditions/illness Management All past medical history, surgical history, allergies, family history, immunizations and social history was obtained and updated from the patient today and entered into the electronic medical record.    Hypertension/hyperlipidemia/overweight:   Pt reports compliance with telmisartan 80 mg daily, Lipitor 20 mg daily and daily baby aspirin.  Patient denies chest pain, shortness of breath, dizziness or lower extremity edema.   Pt endorses use of daily baby ASA. Pt is  prescribed statin. Exercise: exercise and pickle ball.  RF: HTN, HLD, Fhx Heart disease   Depression/anxiety/Sleep d/o:had seen psych in the past and transferred care to PCP 2022.  She reports her depression/anxiety is well controlled on prozac 40 mg qd.  She also suffers from sleep disturbance and responds well to ambien 2.5-5 mg qhs prn. She reports this regimen is working well for her    GERD: Pt reports condition is controlled with prilosec. Unable to wean off w.o return of symptoms.   Hip and leg pain' Pt has been suffereing with hip and leg pain  over a year. Has been evaluated By [facility] team. Currently attending PT. Not seeing improvement.     09/03/2022   10:47 AM 04/04/2022   10:39 AM 03/08/2022   10:13 AM 07/05/2021    1:17 PM 03/29/2021   10:13 AM  Depression screen PHQ 2/9  Decreased Interest 1 0 1 2 0  Down, Depressed, Hopeless 1 0 1 2 1   PHQ - 2 Score 2 0 2 4 1   Altered sleeping 1 0 1 0 0  Tired, decreased energy 1 0 1 3 0  Change in appetite 1 0 1 3 0  Feeling bad or failure about yourself  1 0 1 2 0  Trouble concentrating 1 0 1 2 1   Moving slowly or fidgety/restless 0 0 0 0 0  Suicidal thoughts 1 0 0 0 0  PHQ-9 Score 8 0 7 14 2   Difficult doing work/chores Somewhat difficult Not difficult at all Somewhat difficult  Somewhat difficult      09/03/2022   10:48 AM 03/08/2022   10:13 AM 07/05/2021    1:18 PM 11/29/2020    9:31 AM  GAD 7 : Generalized Anxiety Score  Nervous, Anxious, on Edge 1 1 1 1   Control/stop worrying 1 1 1 1   Worry too much - different things 1 1 1 1   Trouble relaxing 1 1  1   Restless 1 0 0 1  Easily annoyed or irritable 1 1 2 1   Afraid - awful might happen 1 1 2 1   Total GAD 7 Score 7 6  7  Anxiety Difficulty Somewhat difficult Somewhat difficult      Immunization History  Administered Date(s) Administered   Covid-19, Mrna,Vaccine(Spikevax)36yrs and older 05/07/2022   Hepatitis A, Adult 11/30/2016   Influenza, High Dose Seasonal PF 10/18/2015, 11/12/2016, 10/18/2017, 09/15/2018, 10/11/2020, 11/07/2021   Influenza,inj,Quad PF,6+ Mos 10/15/2012, 10/06/2014   Influenza-Unspecified 10/15/2012, 10/14/2019   PFIZER Comirnaty(Gray Top)Covid-19 Tri-Sucrose Vaccine 11/07/2021   PFIZER(Purple Top)SARS-COV-2 Vaccination 02/19/2019, 03/12/2019, 10/12/2019, 04/27/2020   Pfizer Covid-19 Vaccine Bivalent Booster 2yrs & up 10/11/2020, 05/23/2021   Pneumococcal Conjugate-13 10/29/2014   Pneumococcal Polysaccharide-23 11/01/2015   Respiratory Syncytial Virus Vaccine,Recomb Aduvanted(Arexvy) 11/23/2021   Td 10/24/2004    Tdap 10/29/2014   Zoster Recombinant(Shingrix) 04/11/2017, 07/25/2017   Zoster, Live 01/30/2007     Past Medical History:  Diagnosis Date   Anxiety    Cavus foot, acquired 09/09/2012    bilateral cavus feet the one on the left shows that she has some probable neurogenic weakness with abnormal curvature and in inversion contracture Podiatry 06/2015: H&P and x-ray reviewed with patient. Today I went ahead and I did a proximal nerve block I was able to aspirate the second MPJ and got out amount of clear fluid and injected with a quarter cc dexamethasone Kenalog and ap   DDD (degenerative disc disease), lumbar    Depression    Endometrial polyp    GERD (gastroesophageal reflux disease)    Hiatal hernia    Hip flexor tendinitis, right 07/02/2016   History of adenomatous polyp of colon    History of esophageal stricture    History of gastric polyp 11/2017   History of PCR DNA positive for HSV2 11/12/2016   History of primary hyperparathyroidism    s/p  left parathryoidectomy 09-17-2008-- resolved   Hyperlipidemia    Hypertension    Insomnia    Lumbar stenosis    Osteopenia    Schatzki's ring    Scoliosis    Tendinopathy of right biceps tendon 07/02/2016   Thickened endometrium    Uterine fibroid    UTI (urinary tract infection) due to Enterococcus 01/05/2020   Wears glasses    Allergies  Allergen Reactions   Latex Itching and Rash   Polysporin [Bacitracin-Polymyxin B] Rash   Past Surgical History:  Procedure Laterality Date   BREAST REDUCTION SURGERY Bilateral 2000   COLONOSCOPY     CYST EXCISION  1985   head and rt axilla   DILATATION & CURETTAGE/HYSTEROSCOPY WITH MYOSURE N/A 08/13/2018   Procedure: DILATATION & CURETTAGE/HYSTEROSCOPY WITH MYOSURE;  Surgeon: Genia Del, MD;  Location: [facility];  Service: Gynecology;  Laterality: N/A;  requests 10:15am in [place] Gyn block request 30 minutes   DILATION AND CURETTAGE OF UTERUS N/A 08/13/2018    ECTOPIC PREGNANCY SURGERY  1981   PARATHYROIDECTOMY Left 09/17/2008   dr Bertram Savin  @[facility]    left superior  (adenoma)   REDUCTION MAMMAPLASTY Bilateral 2000   years ago   SHOULDER SURGERY Right 2007   "frozen"   UPPER GASTROINTESTINAL ENDOSCOPY  last one 12-18-2017   dr Meridee Score   Family History  Problem Relation Age of Onset   Heart disease Father 106   Breast cancer Mother    Hypertension Brother    Hypertension Sister    Breast cancer Maternal Aunt    Colon cancer Neg Hx    Esophageal cancer Neg Hx    Stomach cancer Neg Hx    Rectal cancer Neg Hx    Colon polyps Neg Hx    Social History  Social History Narrative   Lives with husband. Feels safe at home. 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 [employer]. Some college. Former smoker. Wears seat belt.     Allergies as of 09/03/2022       Reactions   Latex Itching, Rash   Polysporin [bacitracin-polymyxin B] Rash        Medication List        Accurate as of September 03, 2022 11:03 AM. If you have any questions, ask your nurse or doctor.          atorvastatin 20 MG tablet Commonly known as: LIPITOR Take 1 tablet (20 mg total) by mouth daily.   CALCIUM 1200 PO Take by mouth.   FIBER PO Take by mouth.   FLUoxetine 40 MG capsule Commonly known as: PROZAC Take 1 capsule (40 mg total) by mouth daily.   omeprazole 40 MG capsule Commonly known as: PRILOSEC Take 1 capsule (40 mg total) by mouth daily.   telmisartan 80 MG tablet Commonly known as: MICARDIS Take 0.5 tablets (40 mg total) by mouth daily.   valACYclovir 500 MG tablet Commonly known as: VALTREX Take 500 mg by mouth as needed.   VITAMIN B 12 PO Take by mouth.   Vitamin D3 10 MCG (400 UNIT) Caps SMARTSIG:1 Capsule(s) By Mouth   zolpidem 5 MG tablet Commonly known as: AMBIEN Take 0.5-1 tablets (2.5-5 mg total) by mouth at bedtime.        All past medical history, surgical history, allergies, family history, immunizations andmedications  were updated in the EMR today and reviewed under the history and medication portions of their EMR.      ROS: 14 pt review of systems performed and negative (unless mentioned in an HPI)  Objective: BP 127/81   Pulse 64   Temp 98.2 F (36.8 C)   Wt 159 lb 6.4 oz (72.3 kg)   SpO2 96%   BMI 28.24 kg/m  Physical Exam Vitals and nursing note reviewed.  Constitutional:      General: She is not in acute distress.    Appearance: Normal appearance. She is not ill-appearing, toxic-appearing or diaphoretic.  HENT:     Head: Normocephalic and atraumatic.  Eyes:     General: No scleral icterus.       Right eye: No discharge.        Left eye: No discharge.     Extraocular Movements: Extraocular movements intact.     Conjunctiva/sclera: Conjunctivae normal.     Pupils: Pupils are equal, round, and reactive to light.  Cardiovascular:     Rate and Rhythm: Normal rate and regular rhythm.  Pulmonary:     Effort: Pulmonary effort is normal. No respiratory distress.     Breath sounds: Normal breath sounds. No wheezing, rhonchi or rales.  Musculoskeletal:     Right lower leg: No edema.     Left lower leg: No edema.  Skin:    General: Skin is warm.     Findings: No rash.  Neurological:     Mental Status: She is alert and oriented to person, place, and time. Mental status is at baseline.     Motor: No weakness.     Gait: Gait normal.  Psychiatric:        Mood and Affect: Mood normal.        Speech: Speech normal.        Behavior: Behavior normal.        Thought Content: Thought content normal. Thought content is  not paranoid. Thought content does not include homicidal or suicidal ideation.        Cognition and Memory: Cognition and memory normal.        Judgment: Judgment normal.    No results found.  Assessment/plan: KANON THRALL is a 72 y.o. female present for Chronic Conditions/illness Management Essential hypertension, benign/Hyperlipidemia, unspecified hyperlipidemia type/obesity     Stable  Continue telmisartan 40 - Continue exercising at least 150 minutes a week. Low-sodium diet. Labs due next visit  Sleep disturbacne:  stable Discussed safety profile with her and the likelihood of possibly needing to try another medication to help her sleep in the future. Continue  Ambien 2.5-5 seems to be working well with her without negative side effects. NCCS database reviewed 09/03/22  Contract signed 11/29/2020 Follow-up face-to-face every 5.5 months needed for refills.   Depression with anxiety/insomnia  Stable Continue  prozac 40  mg qd.   Gastroesophageal reflux disease with esophagitis without hemorrhage/long-term PPI therapy Continue to follow-up with Dr. Russella Dar Continue  Prilosec 40 mg daily Avoid dietary triggers B12, mag and vitamin D UTD 03/2022   Spinal stenosis of lumbar region with neurogenic claudication/ Gait instability - Ambulatory referral to Orthopedic Surgery  Greater trochanteric bursitis of right hip - Ambulatory referral to Orthopedic Surgery   Return in about 24 weeks (around 02/18/2023) for cpe (20 min), Routine chronic condition follow-up.    Orders Placed This Encounter  Procedures   Ambulatory referral to Orthopedic Surgery    Meds ordered this encounter  Medications   FLUoxetine (PROZAC) 40 MG capsule    Sig: Take 1 capsule (40 mg total) by mouth daily.    Dispense:  90 capsule    Refill:  1   omeprazole (PRILOSEC) 40 MG capsule    Sig: Take 1 capsule (40 mg total) by mouth daily.    Dispense:  90 capsule    Refill:  1   telmisartan (MICARDIS) 80 MG tablet    Sig: Take 0.5 tablets (40 mg total) by mouth daily.    Dispense:  45 tablet    Refill:  1   zolpidem (AMBIEN) 5 MG tablet    Sig: Take 0.5-1 tablets (2.5-5 mg total) by mouth at bedtime.    Dispense:  90 tablet    Refill:  1    Referral Orders         Ambulatory referral to Orthopedic Surgery       Electronically signed by: Felix Pacini, DO   Primary Care- [place]

## 2022-09-03 NOTE — Patient Instructions (Addendum)
Return in about 24 weeks (around 02/18/2023) for cpe (20 min), Routine chronic condition follow-up.        Great to see you today.  I have refilled the medication(s) we provide.   If labs were collected or images ordered, we will inform you of  results once we have received them and reviewed. We will contact you either by echart message, or telephone call.  Please give ample time to the testing facility, and our office to run,  receive and review results. Please do not call inquiring of results, even if you can see them in your chart. We will contact you as soon as we are able. If it has been over 1 week since the test was completed, and you have not yet heard from [place], then please call us.    - echart message- for normal results that have been seen by the patient already.   - telephone call: abnormal results or if patient has not viewed results in their echart.  If a referral to a specialist was entered for you, please call us in 2 weeks if you have not heard from the specialist office to schedule.

## 2022-09-06 DIAGNOSIS — H0014 Chalazion left upper eyelid: Secondary | ICD-10-CM | POA: Diagnosis not present

## 2022-09-07 DIAGNOSIS — H2511 Age-related nuclear cataract, right eye: Secondary | ICD-10-CM | POA: Diagnosis not present

## 2022-09-14 DIAGNOSIS — M25551 Pain in right hip: Secondary | ICD-10-CM | POA: Diagnosis not present

## 2022-09-21 ENCOUNTER — Other Ambulatory Visit: Payer: Self-pay | Admitting: Family Medicine

## 2022-09-25 DIAGNOSIS — M25551 Pain in right hip: Secondary | ICD-10-CM | POA: Diagnosis not present

## 2022-09-25 DIAGNOSIS — M545 Low back pain, unspecified: Secondary | ICD-10-CM | POA: Diagnosis not present

## 2022-09-25 DIAGNOSIS — R2689 Other abnormalities of gait and mobility: Secondary | ICD-10-CM | POA: Diagnosis not present

## 2022-09-25 DIAGNOSIS — M5459 Other low back pain: Secondary | ICD-10-CM | POA: Diagnosis not present

## 2022-10-03 DIAGNOSIS — H2512 Age-related nuclear cataract, left eye: Secondary | ICD-10-CM | POA: Diagnosis not present

## 2022-11-30 ENCOUNTER — Telehealth: Payer: Self-pay

## 2022-11-30 MED ORDER — TELMISARTAN 80 MG PO TABS: 40.0000 mg | ORAL_TABLET | Freq: Every day | ORAL | 0 refills | Status: DC

## 2022-11-30 MED ORDER — ATORVASTATIN CALCIUM 20 MG PO TABS: 20.0000 mg | ORAL_TABLET | Freq: Every day | ORAL | 0 refills | Status: DC

## 2022-11-30 NOTE — Telephone Encounter (Signed)
No refills, changed preferred pharmacy location, I have updated location, please make sure [facility]   Prescription Request  11/30/2022  LOV: Visit date not found  What is the name of the medication or equipment? telmisartan (MICARDIS) 80 MG tablet  atorvastatin (LIPITOR) 20 MG tablet    Have you contacted your pharmacy to request a refill? Yes  they told to call pcp - no refills  Which pharmacy would you like this sent to?  [id number] [facility] [place] - [address] [place] [address] Phone: [phone] Fax: [phone]    Patient notified that their request is being sent to the clinical staff for review and that they should receive a response within 2 business days.   Please advise at Mobile [phone] (mobile)

## 2022-11-30 NOTE — Telephone Encounter (Signed)
Pt sent mychart message regarding refills sent.

## 2022-12-04 DIAGNOSIS — M25551 Pain in right hip: Secondary | ICD-10-CM | POA: Diagnosis not present

## 2022-12-04 DIAGNOSIS — M7061 Trochanteric bursitis, right hip: Secondary | ICD-10-CM | POA: Diagnosis not present

## 2022-12-04 DIAGNOSIS — R2689 Other abnormalities of gait and mobility: Secondary | ICD-10-CM | POA: Diagnosis not present

## 2022-12-18 ENCOUNTER — Telehealth: Payer: Self-pay | Admitting: Family Medicine

## 2022-12-18 NOTE — Telephone Encounter (Signed)
Prescription Request  12/18/2022  LOV: 09/03/2022 These medications were sent to the wrong pharmacy, but [facility] [address]   What is the name of the medication or equipment? atorvastatin (LIPITOR) 20 MG tablet, Calcium Carbonate-Vit D-Min (CALCIUM 1200 PO) ,   Cholecalciferol (VITAMIN D3) 10 MCG (400 UNIT) CAPS    Have you contacted your pharmacy to request a refill? Yes   Which pharmacy would you like this sent to?  [id number], [place] - [address] [place] [address] Phone: [phone] Fax: [phone]    Patient notified that their request is being sent to the clinical staff for review and that they should receive a response within 2 business days.   Please advise at Mobile [phone] (mobile)

## 2022-12-31 DIAGNOSIS — M25551 Pain in right hip: Secondary | ICD-10-CM | POA: Diagnosis not present

## 2023-01-02 DIAGNOSIS — M25551 Pain in right hip: Secondary | ICD-10-CM | POA: Diagnosis not present

## 2023-01-24 DIAGNOSIS — M7071 Other bursitis of hip, right hip: Secondary | ICD-10-CM | POA: Diagnosis not present

## 2023-01-29 ENCOUNTER — Other Ambulatory Visit: Payer: Self-pay | Admitting: Family Medicine

## 2023-01-31 DIAGNOSIS — M25551 Pain in right hip: Secondary | ICD-10-CM | POA: Diagnosis not present

## 2023-03-07 ENCOUNTER — Other Ambulatory Visit: Payer: Self-pay | Admitting: Family Medicine

## 2023-03-12 ENCOUNTER — Encounter: Payer: Medicare HMO | Admitting: Family Medicine

## 2023-04-10 ENCOUNTER — Ambulatory Visit (INDEPENDENT_AMBULATORY_CARE_PROVIDER_SITE_OTHER): Payer: Medicare HMO | Admitting: *Deleted

## 2023-04-10 DIAGNOSIS — Z0000 Encounter for general adult medical examination without abnormal findings: Secondary | ICD-10-CM

## 2023-04-10 NOTE — Patient Instructions (Signed)
 Rebecca Long , Thank you for taking time to come for your Medicare Wellness Visit. I appreciate your ongoing commitment to your health goals. Please review the following plan we discussed and let me know if I can assist you in the future.   Screening recommendations/referrals: Colonoscopy: up to date Mammogram: up to date Bone Density: up to date Recommended yearly ophthalmology/optometry visit for glaucoma screening and checkup Recommended yearly dental visit for hygiene and checkup  Vaccinations: Influenza vaccine: up to date Pneumococcal vaccine: up to date Tdap vaccine: up to date Shingles vaccine: up to date       Preventive Care 65 Years and Older, Female Preventive care refers to lifestyle choices and visits with your health care provider that can promote health and wellness. What does preventive care include? A yearly physical exam. This is also called an annual well check. Dental exams once or twice a year. Routine eye exams. Ask your health care provider how often you should have your eyes checked. Personal lifestyle choices, including: Daily care of your teeth and gums. Regular physical activity. Eating a healthy diet. Avoiding tobacco and drug use. Limiting alcohol use. Practicing safe sex. Taking low-dose aspirin every day. Taking vitamin and mineral supplements as recommended by your health care provider. What happens during an annual well check? The services and screenings done by your health care provider during your annual well check will depend on your age, overall health, lifestyle risk factors, and family history of disease. Counseling  Your health care provider may ask you questions about your: Alcohol use. Tobacco use. Drug use. Emotional well-being. Home and relationship well-being. Sexual activity. Eating habits. History of falls. Memory and ability to understand (cognition). Work and work Astronomer. Reproductive health. Screening  You may have  the following tests or measurements: Height, weight, and BMI. Blood pressure. Lipid and cholesterol levels. These may be checked every 5 years, or more frequently if you are over 78 years old. Skin check. Lung cancer screening. You may have this screening every year starting at age 58 if you have a 30-pack-year history of smoking and currently smoke or have quit within the past 15 years. Fecal occult blood test (FOBT) of the stool. You may have this test every year starting at age 78. Flexible sigmoidoscopy or colonoscopy. You may have a sigmoidoscopy every 5 years or a colonoscopy every 10 years starting at age 45. Hepatitis C blood test. Hepatitis B blood test. Sexually transmitted disease (STD) testing. Diabetes screening. This is done by checking your blood sugar (glucose) after you have not eaten for a while (fasting). You may have this done every 1-3 years. Bone density scan. This is done to screen for osteoporosis. You may have this done starting at age 42. Mammogram. This may be done every 1-2 years. Talk to your health care provider about how often you should have regular mammograms. Talk with your health care provider about your test results, treatment options, and if necessary, the need for more tests. Vaccines  Your health care provider may recommend certain vaccines, such as: Influenza vaccine. This is recommended every year. Tetanus, diphtheria, and acellular pertussis (Tdap, Td) vaccine. You may need a Td booster every 10 years. Zoster vaccine. You may need this after age 72. Pneumococcal 13-valent conjugate (PCV13) vaccine. One dose is recommended after age 37. Pneumococcal polysaccharide (PPSV23) vaccine. One dose is recommended after age 48. Talk to your health care provider about which screenings and vaccines you need and how often you need them.  This information is not intended to replace advice given to you by your health care provider. Make sure you discuss any questions  you have with your health care provider. Document Released: 02/11/2015 Document Revised: 10/05/2015 Document Reviewed: 11/16/2014 Elsevier Interactive Patient Education  2017 [employer].  Fall Prevention in the Home Falls can cause injuries. They can happen to people of all ages. There are many things you can do to make your home safe and to help prevent falls. What can I do on the outside of my home? Regularly fix the edges of walkways and driveways and fix any cracks. Remove anything that might make you trip as you walk through a door, such as a raised step or threshold. Trim any bushes or trees on the path to your home. Use bright outdoor lighting. Clear any walking paths of anything that might make someone trip, such as rocks or tools. Regularly check to see if handrails are loose or broken. Make sure that both sides of any steps have handrails. Any raised decks and porches should have guardrails on the edges. Have any leaves, snow, or ice cleared regularly. Use sand or salt on walking paths during winter. Clean up any spills in your garage right away. This includes oil or grease spills. What can I do in the bathroom? Use night lights. Install grab bars by the toilet and in the tub and shower. Do not use towel bars as grab bars. Use non-skid mats or decals in the tub or shower. If you need to sit down in the shower, use a plastic, non-slip stool. Keep the floor dry. Clean up any water that spills on the floor as soon as it happens. Remove soap buildup in the tub or shower regularly. Attach bath mats securely with double-sided non-slip rug tape. Do not have throw rugs and other things on the floor that can make you trip. What can I do in the bedroom? Use night lights. Make sure that you have a light by your bed that is easy to reach. Do not use any sheets or blankets that are too big for your bed. They should not hang down onto the floor. Have a firm chair that has side arms. You  can use this for support while you get dressed. Do not have throw rugs and other things on the floor that can make you trip. What can I do in the kitchen? Clean up any spills right away. Avoid walking on wet floors. Keep items that you use a lot in easy-to-reach places. If you need to reach something above you, use a strong step stool that has a grab bar. Keep electrical cords out of the way. Do not use floor polish or wax that makes floors slippery. If you must use wax, use non-skid floor wax. Do not have throw rugs and other things on the floor that can make you trip. What can I do with my stairs? Do not leave any items on the stairs. Make sure that there are handrails on both sides of the stairs and use them. Fix handrails that are broken or loose. Make sure that handrails are as long as the stairways. Check any carpeting to make sure that it is firmly attached to the stairs. Fix any carpet that is loose or worn. Avoid having throw rugs at the top or bottom of the stairs. If you do have throw rugs, attach them to the floor with carpet tape. Make sure that you have a light switch at the  top of the stairs and the bottom of the stairs. If you do not have them, ask someone to add them for you. What else can I do to help prevent falls? Wear shoes that: Do not have high heels. Have rubber bottoms. Are comfortable and fit you well. Are closed at the toe. Do not wear sandals. If you use a stepladder: Make sure that it is fully opened. Do not climb a closed stepladder. Make sure that both sides of the stepladder are locked into place. Ask someone to hold it for you, if possible. Clearly mark and make sure that you can see: Any grab bars or handrails. First and last steps. Where the edge of each step is. Use tools that help you move around (mobility aids) if they are needed. These include: Canes. Walkers. Scooters. Crutches. Turn on the lights when you go into a dark area. Replace any  light bulbs as soon as they burn out. Set up your furniture so you have a clear path. Avoid moving your furniture around. If any of your floors are uneven, fix them. If there are any pets around you, be aware of where they are. Review your medicines with your doctor. Some medicines can make you feel dizzy. This can increase your chance of falling. Ask your doctor what other things that you can do to help prevent falls. This information is not intended to replace advice given to you by your health care provider. Make sure you discuss any questions you have with your health care provider. Document Released: 11/11/2008 Document Revised: 06/23/2015 Document Reviewed: 02/19/2014 Elsevier Interactive Patient Education  2017 [employer].

## 2023-04-10 NOTE — Progress Notes (Signed)
 Subjective:   Rebecca Long is a 73 y.o. female who presents for Medicare Annual (Subsequent) preventive examination.  Visit Complete: Virtual I connected with  Rebecca Long on 04/10/23 by a audio enabled telemedicine application and verified that I am speaking with the correct person using two identifiers.  Patient Location: Home  Provider Location: Home Office  I discussed the limitations of evaluation and management by telemedicine. The patient expressed understanding and agreed to proceed.  Vital Signs: Because this visit was a virtual/telehealth visit, some criteria may be missing or patient reported. Any vitals not documented were not able to be obtained and vitals that have been documented are patient reported.  Patient Medicare AWV questionnaire was completed by the patient on 04-03-2023; I have confirmed that all information answered by patient is correct and no changes since this date.  Cardiac Risk Factors include: advanced age (>23men, >58 women);family history of premature cardiovascular disease     Objective:    Today's Vitals   04/10/23 1052  PainSc: 6    There is no height or weight on file to calculate BMI.     04/10/2023   10:57 AM 04/04/2022   10:41 AM 10/23/2021    8:13 AM 08/11/2021   12:32 PM 03/29/2021   10:15 AM 03/23/2020    9:51 AM 08/13/2018    8:32 AM  Advanced Directives  Does Patient Have a Medical Advance Directive? Yes Yes Yes No Yes Yes Yes  Type of Estate agent of [employer] Power of [place];Living will   Healthcare Power of [employer] of [place];Living will Healthcare Power of [place];Living will  Does patient want to make changes to medical advance directive?   No - Patient declined    No - Patient declined  Copy of Healthcare Power of Attorney in Chart? Yes - validated most recent copy scanned in chart (See row information) No - copy requested   No - copy requested  No - copy requested  Would  patient like information on creating a medical advance directive?    No - Patient declined       Current Medications (verified) Outpatient Encounter Medications as of 04/10/2023  Medication Sig   atorvastatin (LIPITOR) 20 MG tablet Take 1 tablet (20 mg total) by mouth daily.   Calcium Carbonate-Vit D-Min (CALCIUM 1200 PO) Take by mouth.   Cholecalciferol (VITAMIN D3) 10 MCG (400 UNIT) CAPS SMARTSIG:1 Capsule(s) By Mouth   Cyanocobalamin (VITAMIN B 12 PO) Take by mouth.   FIBER PO Take by mouth.   FLUoxetine (PROZAC) 40 MG capsule TAKE 1 CAPSULE (40 MG TOTAL) BY MOUTH DAILY.   omeprazole (PRILOSEC) 40 MG capsule TAKE 1 CAPSULE (40 MG TOTAL) BY MOUTH DAILY.   telmisartan (MICARDIS) 80 MG tablet TAKE 0.5 TABLETS BY MOUTH DAILY.   valACYclovir (VALTREX) 500 MG tablet Take 500 mg by mouth as needed.    zolpidem (AMBIEN) 5 MG tablet Take 0.5-1 tablets (2.5-5 mg total) by mouth at bedtime. (Patient not taking: Reported on 04/10/2023)   No facility-administered encounter medications on file as of 04/10/2023.    Allergies (verified) Latex and Polysporin [bacitracin-polymyxin b]   History: Past Medical History:  Diagnosis Date   Anxiety    Cavus foot, acquired 09/09/2012    bilateral cavus feet the one on the left shows that she has some probable neurogenic weakness with abnormal curvature and in inversion contracture Podiatry 06/2015: H&P and x-ray reviewed with patient. Today I went ahead and I  did a proximal nerve block I was able to aspirate the second MPJ and got out amount of clear fluid and injected with a quarter cc dexamethasone Kenalog and ap   DDD (degenerative disc disease), lumbar    Depression    Endometrial polyp    GERD (gastroesophageal reflux disease)    Hiatal hernia    Hip flexor tendinitis, right 07/02/2016   History of adenomatous polyp of colon    History of esophageal stricture    History of gastric polyp 11/2017   History of PCR DNA positive for HSV2 11/12/2016    History of primary hyperparathyroidism    s/p  left parathryoidectomy 09-17-2008-- resolved   Hyperlipidemia    Hypertension    Insomnia    Lumbar stenosis    Osteopenia    Schatzki's ring    Scoliosis    Tendinopathy of right biceps tendon 07/02/2016   Thickened endometrium    Uterine fibroid    UTI (urinary tract infection) due to Enterococcus 01/05/2020   Wears glasses    Past Surgical History:  Procedure Laterality Date   BREAST REDUCTION SURGERY Bilateral 2000   COLONOSCOPY     CYST EXCISION  1985   head and rt axilla   DILATATION & CURETTAGE/HYSTEROSCOPY WITH MYOSURE N/A 08/13/2018   Procedure: DILATATION & CURETTAGE/HYSTEROSCOPY WITH MYOSURE;  Surgeon: Genia Del, MD;  Location: [facility];  Service: Gynecology;  Laterality: N/A;  requests 10:15am in [place] Gyn block request 30 minutes   DILATION AND CURETTAGE OF UTERUS N/A 08/13/2018   ECTOPIC PREGNANCY SURGERY  1981   PARATHYROIDECTOMY Left 09/17/2008   dr Bertram Savin  @[facility]    left superior  (adenoma)   REDUCTION MAMMAPLASTY Bilateral 2000   years ago   SHOULDER SURGERY Right 2007   "frozen"   UPPER GASTROINTESTINAL ENDOSCOPY  last one 12-18-2017   dr Meridee Score   Family History  Problem Relation Age of Onset   Heart disease Father 51   Breast cancer Mother    Hypertension Brother    Hypertension Sister    Breast cancer Maternal Aunt    Colon cancer Neg Hx    Esophageal cancer Neg Hx    Stomach cancer Neg Hx    Rectal cancer Neg Hx    Colon polyps Neg Hx    Social History   Socioeconomic History   Marital status: Married    Spouse name: Not on file   Number of children: 1   Years of education: Not on file   Highest education level: Not on file  Occupational History   Not on file  Tobacco Use   Smoking status: Former    Current packs/day: 0.00    Types: Cigarettes    Start date: 01/30/1976    Quit date: 01/30/1991    Years since quitting: 32.2   Smokeless tobacco: Never   Vaping Use   Vaping status: Never Used  Substance and Sexual Activity   Alcohol use: Not Currently   Drug use: Never   Sexual activity: Not Currently    Birth control/protection: Post-menopausal    Comment: 1st intercourse- 17, partners- 10, married- 41 yrs   Other Topics Concern   Not on file  Social History Narrative   Lives with husband. Feels safe at home. 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 [employer]. Some college. Former smoker. Wears seat belt.    Social Drivers of Health   Financial Resource Strain: Low Risk  (04/10/2023)   Overall Financial Resource Strain (CARDIA)  Difficulty of Paying Living Expenses: Not hard at all  Food Insecurity: No Food Insecurity (04/10/2023)   Hunger Vital Sign    Worried About Running Out of Food in the Last Year: Never true    Ran Out of Food in the Last Year: Never true  Transportation Needs: No Transportation Needs (04/10/2023)   PRAPARE - Administrator, Civil Service (Medical): No    Lack of Transportation (Non-Medical): No  Physical Activity: Inactive (04/10/2023)   Exercise Vital Sign    Days of Exercise per Week: 0 days    Minutes of Exercise per Session: 0 min  Stress: No Stress Concern Present (04/10/2023)   [employer] of Occupational Health - Occupational Stress Questionnaire    Feeling of Stress : Not at all  Social Connections: Moderately Integrated (04/10/2023)   Social Connection and Isolation Panel [NHANES]    Frequency of Communication with Friends and Family: More than three times a week    Frequency of Social Gatherings with Friends and Family: More than three times a week    Attends Religious Services: Never    Database administrator or Organizations: Yes    Attends Engineer, structural: 1 to 4 times per year    Marital Status: Married    Tobacco Counseling Counseling given: Not Answered   Clinical Intake:  Pre-visit preparation completed: Yes  Pain : 0-10 Pain Score: 6  Pain Type:  Chronic pain Pain Location: Hip (only when walking ok when sitting) Pain Orientation: Right Pain Descriptors / Indicators: Constant, Burning, Aching Pain Onset: More than a month ago Pain Frequency: Constant     Diabetes: No  How often do you need to have someone help you when you read instructions, pamphlets, or other written materials from your doctor or pharmacy?: 1 - Never  Interpreter Needed?: No  Information entered by :: Remi Haggard LPN   Activities of Daily Living    04/10/2023   10:59 AM 04/03/2023    9:14 AM  In your present state of health, do you have any difficulty performing the following activities:  Hearing? 0 0  Vision? 0 0  Difficulty concentrating or making decisions? 1 1  Walking or climbing stairs? 1 1  Dressing or bathing? 0 0  Doing errands, shopping? 0 0  Preparing Food and eating ? N N  Using the Toilet? N N  In the past six months, have you accidently leaked urine? Y Y  Do you have problems with loss of bowel control? N N  Managing your Medications? N N  Managing your Finances? N N  Housekeeping or managing your Housekeeping? N N    Patient Care Team: Natalia Leatherwood, DO as PCP - General (Family Medicine) Regal, Kirstie Peri, DPM as Consulting Physician (Podiatry) Bufford Buttner, MD as Consulting Physician (Dermatology) Enid Baas, MD as Consulting Physician (Sports Medicine) Meryl Dare, MD (Inactive) as Consulting Physician (Gastroenterology) Jethro Bolus, MD as Consulting Physician (Ophthalmology) Genia Del, MD as Consulting Physician (Obstetrics and Gynecology) Tamela Oddi, PA-C as Physician Assistant (Psychiatry)  Indicate any recent Medical Services you may have received from other than [facility] providers in the past year (date may be approximate).     Assessment:   This is a routine wellness examination for [place].  Hearing/Vision screen Hearing Screening - Comments:: No trouble hearing Vision Screening - Comments:: Up  to date Cataract surgery  Unsure of name   Goals Addressed  This Visit's Progress    Patient Stated       Getting hip fixed       Depression Screen    04/10/2023   11:01 AM 09/03/2022   10:47 AM 04/04/2022   10:39 AM 03/08/2022   10:13 AM 07/05/2021    1:17 PM 03/29/2021   10:13 AM 11/29/2020    9:31 AM  PHQ 2/9 Scores  PHQ - 2 Score 4 2 0 2 4 1 2   PHQ- 9 Score 13 8 0 7 14 2 9     Fall Risk    04/10/2023   10:54 AM 04/03/2023    9:14 AM 09/03/2022   10:47 AM 04/04/2022   10:41 AM 03/29/2021   10:16 AM  Fall Risk   Falls in the past year? 1 1 1 1  0  Number falls in past yr: 1 1 1 1  0  Injury with Fall? 1 1 1  0 0  Risk for fall due to : Impaired balance/gait  History of fall(s) Impaired vision;Impaired balance/gait;Impaired mobility Impaired vision  Follow up Falls evaluation completed;Education provided;Falls prevention discussed  Falls evaluation completed Falls prevention discussed Falls prevention discussed    MEDICARE RISK AT HOME: Medicare Risk at Home Any stairs in or around the home?: Yes If so, are there any without handrails?: No Home free of loose throw rugs in walkways, pet beds, electrical cords, etc?: Yes Adequate lighting in your home to reduce risk of falls?: Yes Life alert?: No Use of a cane, walker or w/c?: Yes Grab bars in the bathroom?: Yes Shower chair or bench in shower?: Yes Elevated toilet seat or a handicapped toilet?: Yes  TIMED UP AND GO:  Was the test performed?  No    Cognitive Function:    04/09/2017    2:27 PM 10/18/2015   11:12 AM  MMSE - Mini Mental State Exam  Not completed:  --  Orientation to time 5   Orientation to Place 5   Registration 3   Attention/ Calculation 5   Recall 3   Language- name 2 objects 2   Language- repeat 1   Language- follow 3 step command 3   Language- read & follow direction 1   Write a sentence 1   Copy design 1   Total score 30         04/10/2023   10:59 AM 04/04/2022   10:43 AM 03/29/2021    10:19 AM  6CIT Screen  What Year? 0 points 0 points 0 points  What month? 0 points 0 points 0 points  What time? 0 points 0 points 0 points  Count back from 20 0 points 0 points 0 points  Months in reverse 2 points 0 points 0 points  Repeat phrase 2 points 0 points 0 points  Total Score 4 points 0 points 0 points    Immunizations Immunization History  Administered Date(s) Administered   Hepatitis A, Adult 11/30/2016   Influenza, High Dose Seasonal PF 10/18/2015, 11/12/2016, 10/18/2017, 09/15/2018, 10/11/2020, 11/07/2021, 10/08/2022   Influenza,inj,Quad PF,6+ Mos 10/15/2012, 10/06/2014   Influenza-Unspecified 10/15/2012, 10/14/2019   Moderna Covid-19 Fall Seasonal Vaccine 56yrs & older 10/08/2022   PFIZER Comirnaty(Gray Top)Covid-19 Tri-Sucrose Vaccine 11/07/2021   PFIZER(Purple Top)SARS-COV-2 Vaccination 02/19/2019, 03/12/2019, 10/12/2019, 04/27/2020   Pfizer Covid-19 Vaccine Bivalent Booster 109yrs & up 10/11/2020, 05/23/2021   Pneumococcal Conjugate-13 10/29/2014   Pneumococcal Polysaccharide-23 11/01/2015   Respiratory Syncytial Virus Vaccine,Recomb Aduvanted(Arexvy) 11/23/2021   Td 10/24/2004   Tdap 10/29/2014   Zoster Recombinant(Shingrix) 04/11/2017,  07/25/2017   Zoster, Live 01/30/2007    TDAP status: Up to date  Flu Vaccine status: Up to date  Pneumococcal vaccine status: Up to date  Covid-19 vaccine status: Information provided on how to obtain vaccines.   Qualifies for Shingles Vaccine? No   Zostavax completed Yes   Shingrix Completed?: Yes  Screening Tests Health Maintenance  Topic Date Due   MAMMOGRAM  06/14/2023   DEXA SCAN  03/27/2024   Medicare Annual Wellness (AWV)  04/09/2024   Colonoscopy  07/26/2024   DTaP/Tdap/Td (3 - Td or Tdap) 10/28/2024   Pneumonia Vaccine 35+ Years old  Completed   INFLUENZA VACCINE  Completed   Hepatitis C Screening  Completed   Zoster Vaccines- Shingrix  Completed   HPV VACCINES  Aged Out   COVID-19 Vaccine   Discontinued    Health Maintenance  There are no preventive care reminders to display for this patient.   Colorectal cancer screening: Type of screening: Colonoscopy. Completed 2023. Repeat every 3 years  Mammogram status: Completed  . Repeat every year  Bone density due 2026  Lung Cancer Screening: (Low Dose CT Chest recommended if Age 35-80 years, 20 pack-year currently smoking OR have quit w/in 15years.) does not qualify.   Lung Cancer Screening Referral:   Additional Screening:  Hepatitis C Screening: does not qualify; Completed 2017  Vision Screening: Recommended annual ophthalmology exams for early detection of glaucoma and other disorders of the eye. Is the patient up to date with their annual eye exam?  Yes  Who is the provider or what is the name of the office in which the patient attends annual eye exams? Unsure of name If pt is not established with a provider, would they like to be referred to a provider to establish care? No .   Dental Screening: Recommended annual dental exams for proper oral hygiene    Community Resource Referral / Chronic Care Management: CRR required this visit?  No   CCM required this visit?  No     Plan:     I have personally reviewed and noted the following in the patient's chart:   Medical and social history Use of alcohol, tobacco or illicit drugs  Current medications and supplements including opioid prescriptions. Patient is not currently taking opioid prescriptions. Functional ability and status Nutritional status Physical activity Advanced directives List of other physicians Hospitalizations, surgeries, and ER visits in previous 12 months Vitals Screenings to include cognitive, depression, and falls Referrals and appointments  In addition, I have reviewed and discussed with patient certain preventive protocols, quality metrics, and best practice recommendations. A written personalized care plan for preventive services as  well as general preventive health recommendations were provided to patient.     Remi Haggard, LPN   05/07/8117   After Visit Summary: (MyChart) Due to this being a telephonic visit, the after visit summary with patients personalized plan was offered to patient via MyChart   Nurse Notes:

## 2023-04-12 ENCOUNTER — Encounter: Payer: Medicare HMO | Admitting: Family Medicine

## 2023-04-25 ENCOUNTER — Encounter: Payer: Self-pay | Admitting: Family Medicine

## 2023-04-25 ENCOUNTER — Ambulatory Visit (INDEPENDENT_AMBULATORY_CARE_PROVIDER_SITE_OTHER): Payer: Self-pay | Admitting: Family Medicine

## 2023-04-25 VITALS — BP 130/80 | HR 74 | Temp 98.1°F | Ht 63.0 in | Wt 160.2 lb

## 2023-04-25 DIAGNOSIS — Z79899 Other long term (current) drug therapy: Secondary | ICD-10-CM

## 2023-04-25 DIAGNOSIS — M810 Age-related osteoporosis without current pathological fracture: Secondary | ICD-10-CM

## 2023-04-25 DIAGNOSIS — Z5181 Encounter for therapeutic drug level monitoring: Secondary | ICD-10-CM | POA: Diagnosis not present

## 2023-04-25 DIAGNOSIS — F418 Other specified anxiety disorders: Secondary | ICD-10-CM

## 2023-04-25 DIAGNOSIS — N8110 Cystocele, unspecified: Secondary | ICD-10-CM | POA: Diagnosis not present

## 2023-04-25 DIAGNOSIS — Z1231 Encounter for screening mammogram for malignant neoplasm of breast: Secondary | ICD-10-CM

## 2023-04-25 DIAGNOSIS — E782 Mixed hyperlipidemia: Secondary | ICD-10-CM | POA: Diagnosis not present

## 2023-04-25 DIAGNOSIS — R32 Unspecified urinary incontinence: Secondary | ICD-10-CM

## 2023-04-25 DIAGNOSIS — G479 Sleep disorder, unspecified: Secondary | ICD-10-CM

## 2023-04-25 DIAGNOSIS — K2100 Gastro-esophageal reflux disease with esophagitis, without bleeding: Secondary | ICD-10-CM | POA: Diagnosis not present

## 2023-04-25 DIAGNOSIS — Z131 Encounter for screening for diabetes mellitus: Secondary | ICD-10-CM | POA: Diagnosis not present

## 2023-04-25 DIAGNOSIS — I10 Essential (primary) hypertension: Secondary | ICD-10-CM | POA: Diagnosis not present

## 2023-04-25 DIAGNOSIS — Z0000 Encounter for general adult medical examination without abnormal findings: Secondary | ICD-10-CM

## 2023-04-25 LAB — CBC
HCT: 42.5 % (ref 36.0–46.0)
Hemoglobin: 13.9 g/dL (ref 12.0–15.0)
MCHC: 32.7 g/dL (ref 30.0–36.0)
MCV: 87.9 fl (ref 78.0–100.0)
Platelets: 282 10*3/uL (ref 150.0–400.0)
RBC: 4.83 Mil/uL (ref 3.87–5.11)
RDW: 14.6 % (ref 11.5–15.5)
WBC: 6.1 10*3/uL (ref 4.0–10.5)

## 2023-04-25 LAB — COMPREHENSIVE METABOLIC PANEL WITH GFR
ALT: 17 U/L (ref 0–35)
AST: 16 U/L (ref 0–37)
Albumin: 4.3 g/dL (ref 3.5–5.2)
Alkaline Phosphatase: 55 U/L (ref 39–117)
BUN: 17 mg/dL (ref 6–23)
CO2: 29 meq/L (ref 19–32)
Calcium: 9.7 mg/dL (ref 8.4–10.5)
Chloride: 105 meq/L (ref 96–112)
Creatinine, Ser: 0.72 mg/dL (ref 0.40–1.20)
GFR: 82.86 mL/min (ref >60.00)
Glucose, Bld: 98 mg/dL (ref 70–99)
Potassium: 4.4 meq/L (ref 3.5–5.1)
Sodium: 141 meq/L (ref 135–145)
Total Bilirubin: 0.9 mg/dL (ref 0.2–1.2)
Total Protein: 6.5 g/dL (ref 6.0–8.3)

## 2023-04-25 LAB — LIPID PANEL
Cholesterol: 188 mg/dL (ref 0–200)
HDL: 61.9 mg/dL (ref >39.00)
LDL Cholesterol: 104 mg/dL — ABNORMAL HIGH (ref 0–99)
NonHDL: 126.11
Total CHOL/HDL Ratio: 3
Triglycerides: 113 mg/dL (ref 0.0–149.0)
VLDL: 22.6 mg/dL (ref 0.0–40.0)

## 2023-04-25 LAB — B12 AND FOLATE PANEL
Folate: 5.3 ng/mL — ABNORMAL LOW (ref >5.9)
Vitamin B-12: 331 pg/mL (ref 211–911)

## 2023-04-25 LAB — VITAMIN D 25 HYDROXY (VIT D DEFICIENCY, FRACTURES): VITD: 46.97 ng/mL (ref 30.00–100.00)

## 2023-04-25 LAB — TSH: TSH: 1.95 u[IU]/mL (ref 0.35–5.50)

## 2023-04-25 LAB — MAGNESIUM: Magnesium: 2 mg/dL (ref 1.5–2.5)

## 2023-04-25 MED ORDER — OMEPRAZOLE 40 MG PO CPDR: 40.0000 mg | DELAYED_RELEASE_CAPSULE | Freq: Every day | ORAL | 3 refills | Status: AC

## 2023-04-25 MED ORDER — TELMISARTAN 80 MG PO TABS: 80.0000 mg | ORAL_TABLET | Freq: Every day | ORAL | 3 refills | Status: DC

## 2023-04-25 MED ORDER — TELMISARTAN 80 MG PO TABS
80.0000 mg | ORAL_TABLET | Freq: Every day | ORAL | 1 refills | Status: AC
Start: 2023-04-25 — End: ?

## 2023-04-25 MED ORDER — ATORVASTATIN CALCIUM 20 MG PO TABS: 20.0000 mg | ORAL_TABLET | Freq: Every day | ORAL | 3 refills | Status: AC

## 2023-04-25 MED ORDER — FLUOXETINE HCL 40 MG PO CAPS: 40.0000 mg | ORAL_CAPSULE | Freq: Every day | ORAL | 3 refills | Status: AC

## 2023-04-25 NOTE — Patient Instructions (Addendum)

## 2023-04-25 NOTE — Progress Notes (Signed)
 Patient ID: Rebecca Long, female  DOB: January 24, 1951, 73 y.o.   MRN: [id number] Patient Care Team    Relationship Specialty Notifications Start End  Natalia Leatherwood, DO PCP - General Family Medicine  10/06/14   Lenn Sink, [place] Consulting Physician Podiatry  10/18/15   Bufford Buttner, MD Consulting Physician Dermatology  10/18/15   Enid Baas, MD Consulting Physician Sports Medicine  11/01/15   Meryl Dare, MD (Inactive) Consulting Physician Gastroenterology  11/04/17   Jethro Bolus, MD Consulting Physician Ophthalmology  06/17/18   Genia Del, MD Consulting Physician Obstetrics and Gynecology  06/17/18   Tamela Oddi, PA-C Physician Assistant Psychiatry  06/17/18    Comment: [facility]    Chief Complaint  Patient presents with   Annual Exam    Chronic Conditions/illness Management Pt is fasting.     Subjective: Rebecca Long is a 73 y.o.  Female  present for CPE and chronic Conditions/illness Management All past medical history, surgical history, allergies, family history, immunizations and social history was obtained and updated from the patient today and entered into the electronic medical record.   Health maintenance:  Colonoscopy: 07/26/2021, 3 year follow up. Dr. Russella Dar Mammogram (50-74): 06/14/2022 at [facility] [place]-ordered Immunizations: tdap UTD 2016, PNA series completed, shingrix completed, covid series completed with booster, flu shot UTD 2024 Infectious disease screening: completed 2017 DEXA: Completed 03/27/2022-osteoporosis T-score -2.7, repeat 2 years Glaucoma screen:Yearly at [facility] eye Patient has a Dental home. Hospitalizations/ED visits: reviewed  Hypertension/hyperlipidemia/overweight:   Pt reports compliance with telmisartan 80 mg daily, Lipitor 20 mg daily and daily baby aspirin.  Patient denies chest pain, shortness of breath, dizziness or lower extremity edema.  Pt is  prescribed statin. Exercise: exercise and  pickle ball.  RF: HTN, HLD, Fhx Heart disease   Depression/anxiety/Sleep d/o:had seen psych in the past and transferred care to PCP 2022.  She reports her depression/anxiety is controlled on prozac 40 mg qd.  No longer using Ambien   GERD: Pt reports condition is-year-old with prilosec. Unable to wean off w.o return of symptoms.       04/10/2023   11:01 AM 09/03/2022   10:47 AM 04/04/2022   10:39 AM 03/08/2022   10:13 AM 07/05/2021    1:17 PM  Depression screen PHQ 2/9  Decreased Interest 2 1 0 1 2  Down, Depressed, Hopeless 2 1 0 1 2  PHQ - 2 Score 4 2 0 2 4  Altered sleeping 1 1 0 1 0  Tired, decreased energy 3 1 0 1 3  Change in appetite 3 1 0 1 3  Feeling bad or failure about yourself  0 1 0 1 2  Trouble concentrating 2 1 0 1 2  Moving slowly or fidgety/restless 0 0 0 0 0  Suicidal thoughts 0 1 0 0 0  PHQ-9 Score 13 8 0 7 14  Difficult doing work/chores Somewhat difficult Somewhat difficult Not difficult at all Somewhat difficult       09/03/2022   10:48 AM 03/08/2022   10:13 AM 07/05/2021    1:18 PM 11/29/2020    9:31 AM  GAD 7 : Generalized Anxiety Score  Nervous, Anxious, on Edge 1 1 1 1   Control/stop worrying 1 1 1 1   Worry too much - different things 1 1 1 1   Trouble relaxing 1 1  1   Restless 1 0 0 1  Easily annoyed or irritable 1 1 2 1   Afraid -  awful might happen 1 1 2 1   Total GAD 7 Score 7 6  7   Anxiety Difficulty Somewhat difficult Somewhat difficult      Immunization History  Administered Date(s) Administered   Hepatitis A, Adult 11/30/2016   Influenza, High Dose Seasonal PF 10/18/2015, 11/12/2016, 10/18/2017, 09/15/2018, 10/11/2020, 11/07/2021, 10/08/2022   Influenza,inj,Quad PF,6+ Mos 10/15/2012, 10/06/2014   Influenza-Unspecified 10/15/2012, 10/14/2019   Moderna Covid-19 Fall Seasonal Vaccine 53yrs & older 10/08/2022   PFIZER Comirnaty(Gray Top)Covid-19 Tri-Sucrose Vaccine 11/07/2021   PFIZER(Purple Top)SARS-COV-2 Vaccination 02/19/2019, 03/12/2019,  10/12/2019, 04/27/2020   Pfizer Covid-19 Vaccine Bivalent Booster 58yrs & up 10/11/2020, 05/23/2021   Pneumococcal Conjugate-13 10/29/2014   Pneumococcal Polysaccharide-23 11/01/2015   Respiratory Syncytial Virus Vaccine,Recomb Aduvanted(Arexvy) 11/23/2021   Td 10/24/2004   Tdap 10/29/2014   Zoster Recombinant(Shingrix) 04/11/2017, 07/25/2017   Zoster, Live 01/30/2007     Past Medical History:  Diagnosis Date   Anxiety    Cavus foot, acquired 09/09/2012    bilateral cavus feet the one on the left shows that she has some probable neurogenic weakness with abnormal curvature and in inversion contracture Podiatry 06/2015: H&P and x-ray reviewed with patient. Today I went ahead and I did a proximal nerve block I was able to aspirate the second MPJ and got out amount of clear fluid and injected with a quarter cc dexamethasone Kenalog and ap   DDD (degenerative disc disease), lumbar    Depression    Endometrial polyp    GERD (gastroesophageal reflux disease)    Hiatal hernia    Hip flexor tendinitis, right 07/02/2016   History of adenomatous polyp of colon    History of esophageal stricture    History of gastric polyp 11/2017   History of PCR DNA positive for HSV2 11/12/2016   History of primary hyperparathyroidism    s/p  left parathryoidectomy 09-17-2008-- resolved   Hyperlipidemia    Hypertension    Insomnia    Lumbar stenosis    Osteopenia    Schatzki's ring    Scoliosis    Tendinopathy of right biceps tendon 07/02/2016   Thickened endometrium    Uterine fibroid    UTI (urinary tract infection) due to Enterococcus 01/05/2020   Wears glasses    Allergies  Allergen Reactions   Latex Itching and Rash   Polysporin [Bacitracin-Polymyxin B] Rash   Past Surgical History:  Procedure Laterality Date   BREAST REDUCTION SURGERY Bilateral 2000   COLONOSCOPY     CYST EXCISION  1985   head and rt axilla   DILATATION & CURETTAGE/HYSTEROSCOPY WITH MYOSURE N/A 08/13/2018   Procedure:  DILATATION & CURETTAGE/HYSTEROSCOPY WITH MYOSURE;  Surgeon: Genia Del, MD;  Location: [facility];  Service: Gynecology;  Laterality: N/A;  requests 10:15am in [place] Gyn block request 30 minutes   DILATION AND CURETTAGE OF UTERUS N/A 08/13/2018   ECTOPIC PREGNANCY SURGERY  1981   PARATHYROIDECTOMY Left 09/17/2008   dr Bertram Savin  @[facility]    left superior  (adenoma)   REDUCTION MAMMAPLASTY Bilateral 2000   years ago   SHOULDER SURGERY Right 2007   "frozen"   UPPER GASTROINTESTINAL ENDOSCOPY  last one 12-18-2017   dr Meridee Score   Family History  Problem Relation Age of Onset   Heart disease Father 77   Breast cancer Mother    Hypertension Brother    Hypertension Sister    Breast cancer Maternal Aunt    Colon cancer Neg Hx    Esophageal cancer Neg Hx    Stomach cancer  Neg Hx    Rectal cancer Neg Hx    Colon polyps Neg Hx    Social History   Social History Narrative   Lives with husband. Feels safe at home. 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 [employer]. Some college. Former smoker. Wears seat belt.     Allergies as of 04/25/2023       Reactions   Latex Itching, Rash   Polysporin [bacitracin-polymyxin B] Rash        Medication List        Accurate as of April 25, 2023 11:07 AM. If you have any questions, ask your nurse or doctor.          STOP taking these medications    zolpidem 5 MG tablet Commonly known as: AMBIEN Stopped by: Felix Pacini       TAKE these medications    atorvastatin 20 MG tablet Commonly known as: LIPITOR Take 1 tablet (20 mg total) by mouth daily.   CALCIUM 1200 PO Take by mouth.   FIBER PO Take by mouth.   FLUoxetine 40 MG capsule Commonly known as: PROZAC Take 1 capsule (40 mg total) by mouth daily.   omeprazole 40 MG capsule Commonly known as: PRILOSEC Take 1 capsule (40 mg total) by mouth daily.   telmisartan 80 MG tablet Commonly known as: MICARDIS Take 1 tablet (80 mg total) by mouth  daily. What changed: See the new instructions. Changed by: Felix Pacini   valACYclovir 500 MG tablet Commonly known as: VALTREX Take 500 mg by mouth as needed.   VITAMIN B 12 PO Take by mouth.   Vitamin D3 10 MCG (400 UNIT) Caps SMARTSIG:1 Capsule(s) By Mouth        All past medical history, surgical history, allergies, family history, immunizations andmedications were updated in the EMR today and reviewed under the history and medication portions of their EMR.      ROS: 14 pt review of systems performed and negative (unless mentioned in an HPI)  Objective: BP 130/80   Pulse 74   Temp 98.1 F (36.7 C)   Ht 5\' 3"  (1.6 m)   Wt 160 lb 3.2 oz (72.7 kg)   SpO2 98%   BMI 28.38 kg/m  Physical Exam Vitals and nursing note reviewed.  Constitutional:      General: She is not in acute distress.    Appearance: Normal appearance. She is not ill-appearing, toxic-appearing or diaphoretic.  HENT:     Head: Normocephalic and atraumatic.     Right Ear: Tympanic membrane, ear canal and external ear normal. There is no impacted cerumen.     Left Ear: Tympanic membrane, ear canal and external ear normal. There is no impacted cerumen.     Nose: No congestion or rhinorrhea.     Mouth/Throat:     Mouth: Mucous membranes are moist.     Pharynx: Oropharynx is clear. No oropharyngeal exudate or posterior oropharyngeal erythema.  Eyes:     General: No scleral icterus.       Right eye: No discharge.        Left eye: No discharge.     Extraocular Movements: Extraocular movements intact.     Conjunctiva/sclera: Conjunctivae normal.     Pupils: Pupils are equal, round, and reactive to light.  Cardiovascular:     Rate and Rhythm: Normal rate and regular rhythm.     Pulses: Normal pulses.     Heart sounds: Normal heart sounds. No murmur heard.    No  friction rub. No gallop.  Pulmonary:     Effort: Pulmonary effort is normal. No respiratory distress.     Breath sounds: Normal breath sounds.  No stridor. No wheezing, rhonchi or rales.  Chest:     Chest wall: No tenderness.  Abdominal:     General: Abdomen is flat. Bowel sounds are normal. There is no distension.     Palpations: Abdomen is soft. There is no mass.     Tenderness: There is no abdominal tenderness. There is no right CVA tenderness, left CVA tenderness, guarding or rebound.     Hernia: No hernia is present.  Musculoskeletal:        General: No swelling, tenderness or deformity. Normal range of motion.     Cervical back: Normal range of motion and neck supple. No rigidity or tenderness.     Right lower leg: No edema.     Left lower leg: No edema.  Lymphadenopathy:     Cervical: No cervical adenopathy.  Skin:    General: Skin is warm and dry.     Coloration: Skin is not jaundiced or pale.     Findings: No bruising, erythema, lesion or rash.  Neurological:     General: No focal deficit present.     Mental Status: She is alert and oriented to person, place, and time. Mental status is at baseline.     Cranial Nerves: No cranial nerve deficit.     Sensory: No sensory deficit.     Motor: No weakness.     Coordination: Coordination normal.     Gait: Gait normal.     Deep Tendon Reflexes: Reflexes normal.  Psychiatric:        Mood and Affect: Mood normal.        Speech: Speech normal.        Behavior: Behavior normal.        Thought Content: Thought content normal. Thought content is not paranoid. Thought content does not include homicidal or suicidal ideation.        Cognition and Memory: Cognition and memory normal.        Judgment: Judgment normal.    No results found.  Assessment/plan: Rebecca Long is a 73 y.o. female present for CPE and chronic Conditions/illness Management Essential hypertension, benign/Hyperlipidemia, unspecified hyperlipidemia type/obesity   Stable Continue telmisartan 80 - Continue exercising at least 150 minutes a week. Low-sodium diet. Labs: CBC, CMP, lipids and TSH collected  today  Sleep disturbacne:  Stable Discussed safety profile with her and the likelihood of possibly needing to try another medication to help her sleep in the future. NCCS database reviewed 04/25/23, last pickup 08/2022 Contract signed 11/29/2020 Follow-up face-to-face every 5.5 months needed for refills.   Depression with anxiety/insomnia  Stable Continue prozac 40  mg qd.   Gastroesophageal reflux disease with esophagitis without hemorrhage/long-term PPI therapy Continue to follow-up with Dr. Russella Dar Continue Prilosec 40 mg daily Avoid dietary triggers B12, mag and vitamin D collected today  Morbid obesity (HCC)/Diabetes mellitus screening - Hemoglobin A1c  Breast cancer screening by mammogram - MM 3D SCREENING MAMMOGRAM BILATERAL BREAST; Future  Urinary incontinence, unspecified type/Bladder prolapse, female, acquired - Ambulatory referral to Urology  Routine general medical examination at a health care facility (Primary) Patient was encouraged to exercise greater than 150 minutes a week. Patient was encouraged to choose a diet filled with fresh fruits and vegetables, and lean meats. AVS provided to patient today for education/recommendation on gender specific health and safety maintenance.  Colonoscopy: 07/26/2021, 3 year follow up. Dr. Russella Dar Mammogram (50-74): 06/14/2022 at [facility] center [place]-ordered Immunizations: tdap UTD 2016, PNA series completed, shingrix completed, covid series completed with booster, flu shot UTD 2024 Infectious disease screening: completed 2017 DEXA: Completed 03/27/2022-osteoporosis T-score -2.7, repeat 2 years Glaucoma screen:Yearly at [facility] eye   Return in about 24 weeks (around 10/10/2023) for Routine chronic condition follow-up.   Orders Placed This Encounter  Procedures   MM 3D SCREENING MAMMOGRAM BILATERAL BREAST   CBC   Comprehensive metabolic panel   Hemoglobin A1c   Lipid panel   TSH   Vitamin D (25 hydroxy)   B12 and Folate  Panel   Magnesium   Ambulatory referral to Urology    Meds ordered this encounter  Medications   atorvastatin (LIPITOR) 20 MG tablet    Sig: Take 1 tablet (20 mg total) by mouth daily.    Dispense:  90 tablet    Refill:  3   FLUoxetine (PROZAC) 40 MG capsule    Sig: Take 1 capsule (40 mg total) by mouth daily.    Dispense:  90 capsule    Refill:  3   omeprazole (PRILOSEC) 40 MG capsule    Sig: Take 1 capsule (40 mg total) by mouth daily.    Dispense:  90 capsule    Refill:  3   DISCONTD: telmisartan (MICARDIS) 80 MG tablet    Sig: Take 1 tablet (80 mg total) by mouth daily.    Dispense:  90 tablet    Refill:  3   telmisartan (MICARDIS) 80 MG tablet    Sig: Take 1 tablet (80 mg total) by mouth daily.    Dispense:  90 tablet    Refill:  1    Referral Orders         Ambulatory referral to Urology        Electronically signed by: Felix Pacini, DO [facility]

## 2023-04-27 LAB — HEMOGLOBIN A1C: Hgb A1c MFr Bld: 5.6 % (ref 4.6–6.5)

## 2023-04-29 ENCOUNTER — Encounter: Payer: Self-pay | Admitting: Family Medicine

## 2023-04-29 ENCOUNTER — Other Ambulatory Visit: Payer: Self-pay | Admitting: Family Medicine

## 2023-04-29 MED ORDER — FOLIC ACID 1 MG PO TABS: 1.0000 mg | ORAL_TABLET | Freq: Every day | ORAL | 3 refills | Status: AC

## 2023-05-01 DIAGNOSIS — M25551 Pain in right hip: Secondary | ICD-10-CM | POA: Diagnosis not present

## 2023-05-06 DIAGNOSIS — E785 Hyperlipidemia, unspecified: Secondary | ICD-10-CM | POA: Diagnosis not present

## 2023-05-06 DIAGNOSIS — Z9849 Cataract extraction status, unspecified eye: Secondary | ICD-10-CM | POA: Diagnosis not present

## 2023-05-06 DIAGNOSIS — M810 Age-related osteoporosis without current pathological fracture: Secondary | ICD-10-CM | POA: Diagnosis not present

## 2023-05-06 DIAGNOSIS — K219 Gastro-esophageal reflux disease without esophagitis: Secondary | ICD-10-CM | POA: Diagnosis not present

## 2023-05-06 DIAGNOSIS — G8929 Other chronic pain: Secondary | ICD-10-CM | POA: Diagnosis not present

## 2023-05-06 DIAGNOSIS — M109 Gout, unspecified: Secondary | ICD-10-CM | POA: Diagnosis not present

## 2023-05-06 DIAGNOSIS — E663 Overweight: Secondary | ICD-10-CM | POA: Diagnosis not present

## 2023-05-06 DIAGNOSIS — I10 Essential (primary) hypertension: Secondary | ICD-10-CM | POA: Diagnosis not present

## 2023-05-06 DIAGNOSIS — M1990 Unspecified osteoarthritis, unspecified site: Secondary | ICD-10-CM | POA: Diagnosis not present

## 2023-05-06 DIAGNOSIS — Z87891 Personal history of nicotine dependence: Secondary | ICD-10-CM | POA: Diagnosis not present

## 2023-05-06 DIAGNOSIS — F330 Major depressive disorder, recurrent, mild: Secondary | ICD-10-CM | POA: Diagnosis not present

## 2023-05-06 DIAGNOSIS — G4700 Insomnia, unspecified: Secondary | ICD-10-CM | POA: Diagnosis not present

## 2023-05-23 ENCOUNTER — Other Ambulatory Visit (HOSPITAL_BASED_OUTPATIENT_CLINIC_OR_DEPARTMENT_OTHER): Payer: Self-pay

## 2023-05-23 ENCOUNTER — Ambulatory Visit (HOSPITAL_BASED_OUTPATIENT_CLINIC_OR_DEPARTMENT_OTHER): Payer: Self-pay | Admitting: Orthopaedic Surgery

## 2023-05-23 ENCOUNTER — Ambulatory Visit (HOSPITAL_BASED_OUTPATIENT_CLINIC_OR_DEPARTMENT_OTHER): Admitting: Orthopaedic Surgery

## 2023-05-23 DIAGNOSIS — S76011A Strain of muscle, fascia and tendon of right hip, initial encounter: Secondary | ICD-10-CM

## 2023-05-23 MED ORDER — ACETAMINOPHEN 500 MG PO TABS
500.0000 mg | ORAL_TABLET | Freq: Three times a day (TID) | ORAL | 0 refills | Status: AC
  Filled 2023-05-23: qty 30, 10d supply, fill #0

## 2023-05-23 MED ORDER — ASPIRIN 325 MG PO TBEC
325.0000 mg | DELAYED_RELEASE_TABLET | Freq: Every day | ORAL | 0 refills | Status: AC
  Filled 2023-05-23: qty 14, 14d supply, fill #0

## 2023-05-23 MED ORDER — OXYCODONE HCL 5 MG PO TABS
5.0000 mg | ORAL_TABLET | ORAL | 0 refills | Status: AC | PRN
  Filled 2023-05-23: qty 15, 3d supply, fill #0

## 2023-05-23 MED ORDER — IBUPROFEN 800 MG PO TABS
800.0000 mg | ORAL_TABLET | Freq: Three times a day (TID) | ORAL | 0 refills | Status: AC
  Filled 2023-05-23: qty 30, 10d supply, fill #0

## 2023-05-23 NOTE — Addendum Note (Signed)
 Addended by: Carmina Chris on: 05/23/2023 11:29 AM   Modules accepted: Orders

## 2023-05-23 NOTE — Progress Notes (Signed)
 Chief Complaint: Right hip     History of Present Illness:    Rebecca Long is a 73 y.o. female presents today with ongoing right hip pain.  She has been experiencing this after a fall while playing pickle ball injury.  She has subsequently had pain about the lateral aspect of the hip.  She has been going to physical therapy for several years but still having significant pain and weakness.  She does walk with a cane in the left side.  She has had multiple injections without any relief.  She is here today as a further referral.    PMH/PSH/Family History/Social History/Meds/Allergies:    Past Medical History:  Diagnosis Date   Anxiety    Cavus foot, acquired 09/09/2012    bilateral cavus feet the one on the left shows that she has some probable neurogenic weakness with abnormal curvature and in inversion contracture Podiatry 06/2015: H&P and x-ray reviewed with patient. Today I went ahead and I did a proximal nerve block I was able to aspirate the second MPJ and got out amount of clear fluid and injected with a quarter cc dexamethasone  Kenalog  and ap   DDD (degenerative disc disease), lumbar    Depression    Endometrial polyp    GERD (gastroesophageal reflux disease)    Hiatal hernia    Hip flexor tendinitis, right 07/02/2016   History of adenomatous polyp of colon    History of esophageal stricture    History of gastric polyp 11/2017   History of PCR DNA positive for HSV2 11/12/2016   History of primary hyperparathyroidism    s/p  left parathryoidectomy 09-17-2008-- resolved   Hyperlipidemia    Hypertension    Insomnia    Lumbar stenosis    Osteopenia    Schatzki's ring    Scoliosis    Tendinopathy of right biceps tendon 07/02/2016   Thickened endometrium    Uterine fibroid    UTI (urinary tract infection) due to Enterococcus 01/05/2020   Wears glasses    Past Surgical History:  Procedure Laterality Date   BREAST REDUCTION SURGERY Bilateral 2000   COLONOSCOPY      CYST EXCISION  1985   head and rt axilla   DILATATION & CURETTAGE/HYSTEROSCOPY WITH MYOSURE N/A 08/13/2018   Procedure: DILATATION & CURETTAGE/HYSTEROSCOPY WITH MYOSURE;  Surgeon: Lavoie, Marie-Lyne, MD;  Location: [facility];  Service: Gynecology;  Laterality: N/A;  requests 10:15am in [place] Gyn block request 30 minutes   DILATION AND CURETTAGE OF UTERUS N/A 08/13/2018   ECTOPIC PREGNANCY SURGERY  1981   PARATHYROIDECTOMY Left 09/17/2008   dr Shea Denier  @[facility]    left superior  (adenoma)   REDUCTION MAMMAPLASTY Bilateral 2000   years ago   SHOULDER SURGERY Right 2007   "frozen"   UPPER GASTROINTESTINAL ENDOSCOPY  last one 12-18-2017   dr Brice Campi   Social History   Socioeconomic History   Marital status: Married    Spouse name: Not on file   Number of children: 1   Years of education: Not on file   Highest education level: Not on file  Occupational History   Not on file  Tobacco Use   Smoking status: Former    Current packs/day: 0.00    Types: Cigarettes    Start date: 01/30/1976    Quit date: 01/30/1991    Years since quitting: 32.3   Smokeless tobacco: Never  Vaping Use   Vaping status: Never Used  Substance and Sexual Activity  Alcohol use: Not Currently   Drug use: Never   Sexual activity: Not Currently    Birth control/protection: Post-menopausal    Comment: 1st intercourse- 41, partners- 10, married- 41 yrs   Other Topics Concern   Not on file  Social History Narrative   Lives with husband. Feels safe at home. 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 Former smoker. Wears seat belt.    Social Drivers of Corporate investment banker Strain: Low Risk  (04/10/2023)   Overall Financial Resource Strain (CARDIA)    Difficulty of Paying Living Expenses: Not hard at all  Food Insecurity: No Food Insecurity (04/10/2023)   Hunger Vital Sign    Worried About Running Out of Food in the Last Year: Never true    Ran Out of Food in the  Last Year: Never true  Transportation Needs: No Transportation Needs (04/10/2023)   PRAPARE - Administrator, Civil Service (Medical): No    Lack of Transportation (Non-Medical): No  Physical Activity: Inactive (04/10/2023)   Exercise Vital Sign    Days of Exercise per Week: 0 days    Minutes of Exercise per Session: 0 min  Stress: No Stress Concern Present (04/10/2023)   [employer] of Occupational Health - Occupational Stress Questionnaire    Feeling of Stress : Not at all  Social Connections: Moderately Integrated (04/10/2023)   Social Connection and Isolation Panel [NHANES]    Frequency of Communication with Friends and Family: More than three times a week    Frequency of Social Gatherings with Friends and Family: More than three times a week    Attends Religious Services: Never    Database administrator or Organizations: Yes    Attends Engineer, structural: 1 to 4 times per year    Marital Status: Married   Family History  Problem Relation Age of Onset   Heart disease Father 31   Breast cancer Mother    Hypertension Brother    Hypertension Sister    Breast cancer Maternal Aunt    Colon cancer Neg Hx    Esophageal cancer Neg Hx    Stomach cancer Neg Hx    Rectal cancer Neg Hx    Colon polyps Neg Hx    Allergies  Allergen Reactions   Latex Itching and Rash   Polysporin [Bacitracin-Polymyxin B] Rash   Current Outpatient Medications  Medication Sig Dispense Refill   atorvastatin  (LIPITOR) 20 MG tablet Take 1 tablet (20 mg total) by mouth daily. 90 tablet 3   Calcium  Carbonate-Vit D-Min (CALCIUM  1200 PO) Take by mouth.     Cholecalciferol (VITAMIN D3) 10 MCG (400 UNIT) CAPS SMARTSIG:1 Capsule(s) By Mouth     Cyanocobalamin  (VITAMIN B 12 PO) Take by mouth.     FIBER PO Take by mouth.     FLUoxetine  (PROZAC ) 40 MG capsule Take 1 capsule (40 mg total) by mouth daily. 90 capsule 3   folic acid  (FOLVITE ) 1 MG tablet Take 1 tablet (1 mg total) by mouth  daily. 90 tablet 3   omeprazole  (PRILOSEC) 40 MG capsule Take 1 capsule (40 mg total) by mouth daily. 90 capsule 3   telmisartan  (MICARDIS ) 80 MG tablet Take 1 tablet (80 mg total) by mouth daily. 90 tablet 1   valACYclovir  (VALTREX ) 500 MG tablet Take 500 mg by mouth as needed.      No current facility-administered medications for this visit.   No results found.  Review of Systems:  A ROS was performed including pertinent positives and negatives as documented in the HPI.  Physical Exam :   Constitutional: NAD and appears stated age Neurological: Alert and oriented Psych: Appropriate affect and cooperative There were no vitals taken for this visit.   Comprehensive Musculoskeletal Exam:    Tenderness about lateral trochanter with positive Trendelenburg gait.  30 degrees internal/external rotation of the right hip.  Distal neurosensory exam is in tact.  She has weakness with resisted hip abduction   Imaging:   Xray (3 views right hip): No femoral acetabular osteoarthritis  MRI (right hip): There is an end-stage full-thickness tear of the gluteus medius with significant atrophy on T1 coronal view   I personally reviewed and interpreted the radiographs.   Assessment and Plan:   73 y.o. 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  At this time she has trialed both injections as well as physical therapy for strengthening.  She is not experiencing any improvement with this.  Given this I did discuss the possibility of gluteus maximus tendon transfer given the need for transfer of a more successful muscle.  I did discuss the specific risks and limitations as well as associated recovery timeframe.  After discussion she would like to proceed  -Plan for right hip gluteus maximus tendon transfer with collagen patch augmentation   After a lengthy discussion of treatment options, including risks, benefits, alternatives, complications of  surgical and nonsurgical conservative options, the patient elected surgical repair.   The patient  is aware of the material risks  and complications including, but not limited to injury to adjacent structures, neurovascular injury, infection, numbness, bleeding, implant failure, thermal burns, stiffness, persistent pain, failure to heal, disease transmission from allograft, need for further surgery, dislocation, anesthetic risks, blood clots, risks of death,and others. The probabilities of surgical success and failure discussed with patient given their particular co-morbidities.The time and nature of expected rehabilitation and recovery was discussed.The patient's questions were all answered preoperatively.  No barriers to understanding were noted. I explained the natural history of the disease process and Rx rationale.  I explained to the patient what I considered to be reasonable expectations given their personal situation.  The final treatment plan was arrived at through a shared patient decision making process model.    I personally saw and evaluated the patient, and participated in the management and treatment plan.  Wilhelmenia Harada, MD Attending Physician, Orthopedic Surgery  This document was dictated using Dragon voice recognition software. A reasonable attempt at proof reading has been made to minimize errors.

## 2023-05-27 DIAGNOSIS — Z85828 Personal history of other malignant neoplasm of skin: Secondary | ICD-10-CM | POA: Diagnosis not present

## 2023-05-27 DIAGNOSIS — D225 Melanocytic nevi of trunk: Secondary | ICD-10-CM | POA: Diagnosis not present

## 2023-05-27 DIAGNOSIS — L821 Other seborrheic keratosis: Secondary | ICD-10-CM | POA: Diagnosis not present

## 2023-05-27 DIAGNOSIS — L814 Other melanin hyperpigmentation: Secondary | ICD-10-CM | POA: Diagnosis not present

## 2023-05-27 DIAGNOSIS — D1801 Hemangioma of skin and subcutaneous tissue: Secondary | ICD-10-CM | POA: Diagnosis not present

## 2023-05-27 DIAGNOSIS — D692 Other nonthrombocytopenic purpura: Secondary | ICD-10-CM | POA: Diagnosis not present

## 2023-05-27 DIAGNOSIS — D2262 Melanocytic nevi of left upper limb, including shoulder: Secondary | ICD-10-CM | POA: Diagnosis not present

## 2023-05-29 DIAGNOSIS — N3946 Mixed incontinence: Secondary | ICD-10-CM | POA: Diagnosis not present

## 2023-05-29 DIAGNOSIS — R350 Frequency of micturition: Secondary | ICD-10-CM | POA: Diagnosis not present

## 2023-05-30 ENCOUNTER — Telehealth: Payer: Self-pay | Admitting: Family Medicine

## 2023-05-30 NOTE — Telephone Encounter (Signed)
 Received preoperative clearance form from [facility] for patient to undergo general anesthesia for right gluteus maximus tendon transfer and collagen patch augmentation planned.  Reviewed recent labs cleared for surgery  Form completed and placed in [facility] work basket to be faxed immediately to orthopedic team

## 2023-05-30 NOTE — Telephone Encounter (Signed)
 Faxed

## 2023-06-04 NOTE — Telephone Encounter (Signed)
 Reason for CRM: Rebecca Long with Max Spain at [facility] faxed over on 04/25 a surgery clearance and haven't recieved a response. She is wanting an update.

## 2023-06-21 ENCOUNTER — Other Ambulatory Visit: Payer: Self-pay

## 2023-06-21 ENCOUNTER — Telehealth: Payer: Self-pay | Admitting: Orthopaedic Surgery

## 2023-06-21 ENCOUNTER — Other Ambulatory Visit (HOSPITAL_BASED_OUTPATIENT_CLINIC_OR_DEPARTMENT_OTHER): Payer: Self-pay | Admitting: Orthopaedic Surgery

## 2023-06-21 ENCOUNTER — Encounter (HOSPITAL_BASED_OUTPATIENT_CLINIC_OR_DEPARTMENT_OTHER): Payer: Self-pay | Admitting: Orthopaedic Surgery

## 2023-06-21 MED ORDER — HYDROCODONE-ACETAMINOPHEN 5-325 MG PO TABS: 1.0000 | ORAL_TABLET | Freq: Four times a day (QID) | ORAL | 0 refills | Status: DC | PRN

## 2023-06-21 NOTE — Telephone Encounter (Signed)
 RC to patient informing her meds were sent. No further questions asked

## 2023-06-21 NOTE — Telephone Encounter (Signed)
 Patient was wondering if she could get away with using Hydrocodone instead of Oxycodone  for after surgery. She would prefer the less strong medication if possible. Patient does not like how Oxyo makes her feel out of it while taking it. Patient uses [facility]  on [address]. Please call to advise.

## 2023-06-25 ENCOUNTER — Ambulatory Visit

## 2023-06-27 ENCOUNTER — Ambulatory Visit
Admission: RE | Admit: 2023-06-27 | Discharge: 2023-06-27 | Disposition: A | Discharge status: Home / Self Care | Source: Ambulatory Visit | Attending: Family Medicine | Admitting: Family Medicine

## 2023-06-27 ENCOUNTER — Encounter (HOSPITAL_BASED_OUTPATIENT_CLINIC_OR_DEPARTMENT_OTHER)
Admission: RE | Admit: 2023-06-27 | Discharge: 2023-06-27 | Disposition: A | Discharge status: Home / Self Care | Source: Ambulatory Visit | Attending: Orthopaedic Surgery | Admitting: Orthopaedic Surgery

## 2023-06-27 DIAGNOSIS — I10 Essential (primary) hypertension: Secondary | ICD-10-CM | POA: Insufficient documentation

## 2023-06-27 DIAGNOSIS — Z01810 Encounter for preprocedural cardiovascular examination: Secondary | ICD-10-CM | POA: Insufficient documentation

## 2023-06-27 DIAGNOSIS — Z1231 Encounter for screening mammogram for malignant neoplasm of breast: Secondary | ICD-10-CM | POA: Diagnosis not present

## 2023-06-27 NOTE — Progress Notes (Signed)

## 2023-06-30 NOTE — Anesthesia Preprocedure Evaluation (Signed)
 Anesthesia Evaluation  Patient identified by MRN, date of birth, ID band Patient awake    Reviewed: Allergy & Precautions, NPO status , Patient's Chart, lab work & pertinent test results  History of Anesthesia Complications (+) PONV and history of anesthetic complications  Airway Mallampati: II  TM Distance: >3 FB Neck ROM: Full    Dental no notable dental hx. (+) Dental Advisory Given, Teeth Intact   Pulmonary neg recent URI, former smoker   Pulmonary exam normal breath sounds clear to auscultation       Cardiovascular hypertension, Pt. on medications Normal cardiovascular exam Rhythm:Regular Rate:Normal     Neuro/Psych  PSYCHIATRIC DISORDERS Anxiety Depression     Neuromuscular disease    GI/Hepatic Neg liver ROS, hiatal hernia,GERD  Controlled and Medicated,,  Endo/Other  negative endocrine ROS    Renal/GU negative Renal ROS     Musculoskeletal  (+) Arthritis ,    Abdominal   Peds  Hematology negative hematology ROS (+)   Anesthesia Other Findings   Reproductive/Obstetrics                             Anesthesia Physical Anesthesia Plan  ASA: 2  Anesthesia Plan: General   Post-op Pain Management: Tylenol  PO (pre-op)* and Gabapentin PO (pre-op)*   Induction: Intravenous  PONV Risk Score and Plan: 4 or greater and Ondansetron , Dexamethasone , Midazolam  and Treatment may vary due to age or medical condition  Airway Management Planned: Oral ETT  Additional Equipment: None  Intra-op Plan:   Post-operative Plan: Extubation in OR  Informed Consent: I have reviewed the patients History and Physical, chart, labs and discussed the procedure including the risks, benefits and alternatives for the proposed anesthesia with the patient or authorized representative who has indicated his/her understanding and acceptance.     Dental advisory given  Plan Discussed with: CRNA  Anesthesia  Plan Comments:        Anesthesia Quick Evaluation

## 2023-07-02 ENCOUNTER — Encounter (HOSPITAL_BASED_OUTPATIENT_CLINIC_OR_DEPARTMENT_OTHER)
Admission: RE | Disposition: A | Discharge status: Home / Self Care | Payer: Self-pay | Source: Ambulatory Visit | Attending: Orthopaedic Surgery

## 2023-07-02 ENCOUNTER — Ambulatory Visit (HOSPITAL_BASED_OUTPATIENT_CLINIC_OR_DEPARTMENT_OTHER): Payer: Self-pay | Admitting: Anesthesiology

## 2023-07-02 ENCOUNTER — Other Ambulatory Visit: Payer: Self-pay

## 2023-07-02 ENCOUNTER — Ambulatory Visit: Payer: Self-pay | Admitting: Family Medicine

## 2023-07-02 ENCOUNTER — Encounter (HOSPITAL_BASED_OUTPATIENT_CLINIC_OR_DEPARTMENT_OTHER): Payer: Self-pay | Admitting: Orthopaedic Surgery

## 2023-07-02 ENCOUNTER — Ambulatory Visit (HOSPITAL_BASED_OUTPATIENT_CLINIC_OR_DEPARTMENT_OTHER)
Admission: RE | Admit: 2023-07-02 | Discharge: 2023-07-02 | Disposition: A | Discharge status: Home / Self Care | Source: Ambulatory Visit | Attending: Orthopaedic Surgery | Admitting: Orthopaedic Surgery

## 2023-07-02 DIAGNOSIS — S76011A Strain of muscle, fascia and tendon of right hip, initial encounter: Secondary | ICD-10-CM | POA: Diagnosis not present

## 2023-07-02 DIAGNOSIS — Z87891 Personal history of nicotine dependence: Secondary | ICD-10-CM | POA: Diagnosis not present

## 2023-07-02 DIAGNOSIS — K219 Gastro-esophageal reflux disease without esophagitis: Secondary | ICD-10-CM | POA: Diagnosis not present

## 2023-07-02 DIAGNOSIS — I10 Essential (primary) hypertension: Secondary | ICD-10-CM | POA: Diagnosis not present

## 2023-07-02 DIAGNOSIS — M67951 Unspecified disorder of synovium and tendon, right thigh: Secondary | ICD-10-CM

## 2023-07-02 DIAGNOSIS — M62551 Muscle wasting and atrophy, not elsewhere classified, right thigh: Secondary | ICD-10-CM | POA: Diagnosis not present

## 2023-07-02 DIAGNOSIS — K449 Diaphragmatic hernia without obstruction or gangrene: Secondary | ICD-10-CM | POA: Diagnosis not present

## 2023-07-02 DIAGNOSIS — Z79899 Other long term (current) drug therapy: Secondary | ICD-10-CM | POA: Insufficient documentation

## 2023-07-02 DIAGNOSIS — F32A Depression, unspecified: Secondary | ICD-10-CM | POA: Insufficient documentation

## 2023-07-02 DIAGNOSIS — M67959 Unspecified disorder of synovium and tendon, unspecified thigh: Secondary | ICD-10-CM

## 2023-07-02 DIAGNOSIS — F419 Anxiety disorder, unspecified: Secondary | ICD-10-CM | POA: Insufficient documentation

## 2023-07-02 DIAGNOSIS — W19XXXA Unspecified fall, initial encounter: Secondary | ICD-10-CM | POA: Insufficient documentation

## 2023-07-02 HISTORY — DX: Other specified postprocedural states: Z98.890

## 2023-07-02 HISTORY — DX: Nausea with vomiting, unspecified: R11.2

## 2023-07-02 HISTORY — PX: OPEN SURGICAL REPAIR OF GLUTEAL TENDON: SHX5995

## 2023-07-02 SURGERY — REPAIR, TENDON, GLUTEUS MEDIUS, OPEN
Anesthesia: General | Site: Hip | Laterality: Right

## 2023-07-02 MED ORDER — 0.9 % SODIUM CHLORIDE (POUR BTL) OPTIME
TOPICAL | Status: DC | PRN
  Administered 2023-07-02: 1000 mL

## 2023-07-02 MED ORDER — MIDAZOLAM HCL 2 MG/2ML IJ SOLN
INTRAMUSCULAR | Status: AC
  Filled 2023-07-02: qty 2

## 2023-07-02 MED ORDER — FENTANYL CITRATE (PF) 100 MCG/2ML IJ SOLN
INTRAMUSCULAR | Status: DC | PRN
  Administered 2023-07-02 (×2): 50 ug via INTRAVENOUS

## 2023-07-02 MED ORDER — LACTATED RINGERS IV SOLN: INTRAVENOUS | Status: DC

## 2023-07-02 MED ORDER — ROCURONIUM 10MG/ML (10ML) SYRINGE FOR MEDFUSION PUMP - OPTIME
INTRAVENOUS | Status: DC | PRN
  Administered 2023-07-02: 50 mg via INTRAVENOUS

## 2023-07-02 MED ORDER — FENTANYL CITRATE (PF) 100 MCG/2ML IJ SOLN
INTRAMUSCULAR | Status: AC
  Filled 2023-07-02: qty 2

## 2023-07-02 MED ORDER — PHENYLEPHRINE 80 MCG/ML (10ML) SYRINGE FOR IV PUSH (FOR BLOOD PRESSURE SUPPORT)
PREFILLED_SYRINGE | INTRAVENOUS | Status: AC
  Filled 2023-07-02: qty 10

## 2023-07-02 MED ORDER — DEXAMETHASONE SODIUM PHOSPHATE 10 MG/ML IJ SOLN
INTRAMUSCULAR | Status: AC
  Filled 2023-07-02: qty 1

## 2023-07-02 MED ORDER — KETOROLAC TROMETHAMINE 15 MG/ML IJ SOLN
INTRAMUSCULAR | Status: DC | PRN
Start: 2023-07-02 — End: 2023-07-02
  Administered 2023-07-02: 15 mg via INTRAVENOUS

## 2023-07-02 MED ORDER — KETOROLAC TROMETHAMINE 30 MG/ML IJ SOLN
INTRAMUSCULAR | Status: AC
  Filled 2023-07-02: qty 1

## 2023-07-02 MED ORDER — CEFAZOLIN SODIUM-DEXTROSE 2-4 GM/100ML-% IV SOLN
INTRAVENOUS | Status: AC
Start: 2023-07-02 — End: ?
  Filled 2023-07-02: qty 100

## 2023-07-02 MED ORDER — SUGAMMADEX SODIUM 200 MG/2ML IV SOLN
INTRAVENOUS | Status: DC | PRN
  Administered 2023-07-02: 300 mg via INTRAVENOUS

## 2023-07-02 MED ORDER — OXYCODONE HCL 5 MG/5ML PO SOLN: 5.0000 mg | Freq: Once | ORAL | Status: DC | PRN

## 2023-07-02 MED ORDER — DROPERIDOL 2.5 MG/ML IJ SOLN: 0.6250 mg | Freq: Once | INTRAMUSCULAR | Status: DC | PRN

## 2023-07-02 MED ORDER — TRANEXAMIC ACID-NACL 1000-0.7 MG/100ML-% IV SOLN
INTRAVENOUS | Status: AC
  Filled 2023-07-02: qty 100

## 2023-07-02 MED ORDER — LIDOCAINE 2% (20 MG/ML) 5 ML SYRINGE
INTRAMUSCULAR | Status: AC
  Filled 2023-07-02: qty 5

## 2023-07-02 MED ORDER — ACETAMINOPHEN 500 MG PO TABS
1000.0000 mg | ORAL_TABLET | Freq: Once | ORAL | Status: AC
Start: 2023-07-02 — End: 2023-07-02
  Administered 2023-07-02: 1000 mg via ORAL

## 2023-07-02 MED ORDER — PROPOFOL 10 MG/ML IV BOLUS
INTRAVENOUS | Status: DC | PRN
  Administered 2023-07-02: 120 ug via INTRAVENOUS

## 2023-07-02 MED ORDER — ACETAMINOPHEN 500 MG PO TABS
ORAL_TABLET | ORAL | Status: AC
Start: 2023-07-02 — End: ?
  Filled 2023-07-02: qty 2

## 2023-07-02 MED ORDER — BUPIVACAINE HCL 0.25 % IJ SOLN
INTRAMUSCULAR | Status: DC | PRN
  Administered 2023-07-02: 20 mL

## 2023-07-02 MED ORDER — VANCOMYCIN HCL 1000 MG IV SOLR
INTRAVENOUS | Status: AC
  Filled 2023-07-02: qty 20

## 2023-07-02 MED ORDER — MIDAZOLAM HCL 5 MG/5ML IJ SOLN
INTRAMUSCULAR | Status: DC | PRN
  Administered 2023-07-02: 1 mg via INTRAVENOUS

## 2023-07-02 MED ORDER — GLYCOPYRROLATE PF 0.2 MG/ML IJ SOSY
PREFILLED_SYRINGE | INTRAMUSCULAR | Status: DC | PRN
  Administered 2023-07-02: .2 mg via INTRAVENOUS

## 2023-07-02 MED ORDER — CEFAZOLIN SODIUM-DEXTROSE 2-4 GM/100ML-% IV SOLN
2.0000 g | INTRAVENOUS | Status: AC
  Administered 2023-07-02: 2 g via INTRAVENOUS

## 2023-07-02 MED ORDER — GABAPENTIN 300 MG PO CAPS
ORAL_CAPSULE | ORAL | Status: AC
  Filled 2023-07-02: qty 1

## 2023-07-02 MED ORDER — EPHEDRINE SULFATE-NACL 50-0.9 MG/10ML-% IV SOSY
PREFILLED_SYRINGE | INTRAVENOUS | Status: DC | PRN
  Administered 2023-07-02 (×2): 5 mg via INTRAVENOUS

## 2023-07-02 MED ORDER — LIDOCAINE 2% (20 MG/ML) 5 ML SYRINGE
INTRAMUSCULAR | Status: DC | PRN
  Administered 2023-07-02: 60 mg via INTRAVENOUS

## 2023-07-02 MED ORDER — OXYCODONE HCL 5 MG PO TABS: 5.0000 mg | ORAL_TABLET | Freq: Once | ORAL | Status: DC | PRN

## 2023-07-02 MED ORDER — ONDANSETRON HCL 4 MG/2ML IJ SOLN
INTRAMUSCULAR | Status: AC
  Filled 2023-07-02: qty 2

## 2023-07-02 MED ORDER — FENTANYL CITRATE (PF) 100 MCG/2ML IJ SOLN: 25.0000 ug | INTRAMUSCULAR | Status: DC | PRN

## 2023-07-02 MED ORDER — ROCURONIUM BROMIDE 10 MG/ML (PF) SYRINGE
PREFILLED_SYRINGE | INTRAVENOUS | Status: AC
  Filled 2023-07-02: qty 10

## 2023-07-02 MED ORDER — ONDANSETRON HCL 4 MG/2ML IJ SOLN
INTRAMUSCULAR | Status: DC | PRN
  Administered 2023-07-02: 4 mg via INTRAVENOUS

## 2023-07-02 MED ORDER — GABAPENTIN 300 MG PO CAPS
300.0000 mg | ORAL_CAPSULE | Freq: Once | ORAL | Status: AC
  Administered 2023-07-02: 300 mg via ORAL

## 2023-07-02 MED ORDER — EPHEDRINE 5 MG/ML INJ
INTRAVENOUS | Status: AC
  Filled 2023-07-02: qty 5

## 2023-07-02 MED ORDER — ACETAMINOPHEN 10 MG/ML IV SOLN: 1000.0000 mg | Freq: Once | INTRAVENOUS | Status: DC | PRN

## 2023-07-02 MED ORDER — BUPIVACAINE HCL (PF) 0.25 % IJ SOLN
INTRAMUSCULAR | Status: AC
Start: 2023-07-02 — End: ?
  Filled 2023-07-02: qty 120

## 2023-07-02 MED ORDER — PHENYLEPHRINE 80 MCG/ML (10ML) SYRINGE FOR IV PUSH (FOR BLOOD PRESSURE SUPPORT)
PREFILLED_SYRINGE | INTRAVENOUS | Status: DC | PRN
  Administered 2023-07-02: 160 ug via INTRAVENOUS
  Administered 2023-07-02 (×3): 80 ug via INTRAVENOUS
  Administered 2023-07-02: 160 ug via INTRAVENOUS
  Administered 2023-07-02: 80 ug via INTRAVENOUS

## 2023-07-02 MED ORDER — TRANEXAMIC ACID-NACL 1000-0.7 MG/100ML-% IV SOLN
1000.0000 mg | INTRAVENOUS | Status: AC
  Administered 2023-07-02: 1000 mg via INTRAVENOUS

## 2023-07-02 MED ORDER — DEXAMETHASONE SODIUM PHOSPHATE 10 MG/ML IJ SOLN
INTRAMUSCULAR | Status: DC | PRN
  Administered 2023-07-02: 8 mg via INTRAVENOUS

## 2023-07-02 SURGICAL SUPPLY — 48 items
ANCHOR JUGGERKNOT SOFT 1.5 (Anchor) ×2 IMPLANT
ANCHOR JUGGERKNOT SOFT 2.9 (Anchor) IMPLANT
ANCHOR SUT QUATTRO KNTLS 4.5 (Anchor) IMPLANT
BLADE SURG 10 STRL SS (BLADE) ×2 IMPLANT
BLADE SURG 15 STRL LF DISP TIS (BLADE) ×2 IMPLANT
CANISTER SUCT 1200ML W/VALVE (MISCELLANEOUS) ×2 IMPLANT
CHLORAPREP W/TINT 26 (MISCELLANEOUS) ×2 IMPLANT
CLSR STERI-STRIP ANTIMIC 1/2X4 (GAUZE/BANDAGES/DRESSINGS) IMPLANT
COOLER ICEMAN CLASSIC (MISCELLANEOUS) ×2 IMPLANT
COVER BACK TABLE 60X90IN (DRAPES) ×2 IMPLANT
COVER MAYO STAND STRL (DRAPES) ×2 IMPLANT
DERMABOND ADVANCED .7 DNX12 (GAUZE/BANDAGES/DRESSINGS) IMPLANT
DRAPE STERI IOBAN 125X83 (DRAPES) ×2 IMPLANT
DRAPE U-SHAPE 47X51 STRL (DRAPES) ×4 IMPLANT
DRSG AQUACEL AG ADV 3.5X 6 (GAUZE/BANDAGES/DRESSINGS) ×2 IMPLANT
DRSG AQUACEL AG ADV 3.5X10 (GAUZE/BANDAGES/DRESSINGS) IMPLANT
ELECTRODE BLDE 4.0 EZ CLN MEGD (MISCELLANEOUS) IMPLANT
ELECTRODE REM PT RTRN 9FT ADLT (ELECTROSURGICAL) ×2 IMPLANT
GAUZE PAD ABD 8X10 STRL (GAUZE/BANDAGES/DRESSINGS) IMPLANT
GAUZE XEROFORM 1X8 LF (GAUZE/BANDAGES/DRESSINGS) IMPLANT
GLOVE BIO SURGEON STRL SZ 6 (GLOVE) ×4 IMPLANT
GLOVE BIO SURGEON STRL SZ7.5 (GLOVE) ×4 IMPLANT
GLOVE BIOGEL PI IND STRL 6.5 (GLOVE) ×2 IMPLANT
GLOVE BIOGEL PI IND STRL 8 (GLOVE) ×2 IMPLANT
GOWN STRL REUS W/ TWL LRG LVL3 (GOWN DISPOSABLE) ×4 IMPLANT
GOWN STRL REUS W/TWL XL LVL3 (GOWN DISPOSABLE) ×2 IMPLANT
IMPL TAPESTRY BIOINTEGR 30X30 (Orthopedic Implant) IMPLANT
MANIFOLD NEPTUNE II (INSTRUMENTS) ×2 IMPLANT
NDL HYPO 22X1.5 SAFETY MO (MISCELLANEOUS) ×2 IMPLANT
NEEDLE HYPO 22X1.5 SAFETY MO (MISCELLANEOUS) ×1 IMPLANT
NS IRRIG 1000ML POUR BTL (IV SOLUTION) ×2 IMPLANT
PACK BASIN DAY SURGERY FS (CUSTOM PROCEDURE TRAY) ×2 IMPLANT
PAD COLD SHLDR WRAP-ON (PAD) ×2 IMPLANT
PENCIL SMOKE EVACUATOR (MISCELLANEOUS) ×2 IMPLANT
SLEEVE SCD COMPRESS KNEE MED (STOCKING) ×2 IMPLANT
SPIKE FLUID TRANSFER (MISCELLANEOUS) IMPLANT
SPONGE T-LAP 18X18 ~~LOC~~+RFID (SPONGE) ×2 IMPLANT
SUCTION TUBE FRAZIER 10FR DISP (SUCTIONS) ×2 IMPLANT
SUT ETHILON 3 0 PS 1 (SUTURE) IMPLANT
SUT MNCRL AB 3-0 PS2 27 (SUTURE) IMPLANT
SUT VIC AB 0 CT1 27XBRD ANBCTR (SUTURE) ×2 IMPLANT
SUT VIC AB 2-0 CT1 TAPERPNT 27 (SUTURE) ×2 IMPLANT
SYR 20ML LL LF (SYRINGE) ×2 IMPLANT
SYR BULB EAR ULCER 3OZ GRN STR (SYRINGE) ×2 IMPLANT
TOWEL GREEN STERILE FF (TOWEL DISPOSABLE) ×4 IMPLANT
TUBE CONNECTING 20X1/4 (TUBING) ×2 IMPLANT
UNDERPAD 30X36 HEAVY ABSORB (UNDERPADS AND DIAPERS) IMPLANT
YANKAUER SUCT BULB TIP NO VENT (SUCTIONS) ×2 IMPLANT

## 2023-07-02 NOTE — Anesthesia Postprocedure Evaluation (Signed)
 Anesthesia Post Note  Patient: Rebecca Long  Procedure(s) Performed: REPAIR, TENDON, GLUTEUS MEDIUS, OPEN (Right: Hip)     Patient location during evaluation: PACU Anesthesia Type: General Level of consciousness: sedated and patient cooperative Pain management: pain level controlled Vital Signs Assessment: post-procedure vital signs reviewed and stable Respiratory status: spontaneous breathing Cardiovascular status: stable Anesthetic complications: no   No notable events documented.  Last Vitals:  Vitals:   07/02/23 1030 07/02/23 1058  BP: 111/63 (!) 106/93  Pulse: 86 73  Resp: 16 16  Temp:  (!) 36.3 C  SpO2: 96% 96%    Last Pain:  Vitals:   07/02/23 1058  TempSrc:   PainSc: 0-No pain                 Gorman Laughter

## 2023-07-02 NOTE — Discharge Instructions (Addendum)
 Discharge Instructions    Attending Surgeon: Wilhelmenia Harada, MD Office Phone Number: [phone]   Diagnosis and Procedures:    Surgeries Performed: Right hip gluteus maximus tendon transfer  Discharge Plan:    Diet: Resume usual diet. Begin with light or bland foods.  Drink plenty of fluids.  Activity:  As tolerated with walker for assistance. You are advised to go home directly from the hospital or surgical center. Restrict your activities.  GENERAL INSTRUCTIONS: 1.  Please apply ice to your wound to help with swelling and inflammation. This will improve your comfort and your overall recovery following surgery.     2. Please call Dr. Verline Glow office at [phone] with questions Monday-Friday during business hours. If no one answers, please leave a message and someone should get back to the patient within 24 hours. For emergencies please call 911 or proceed to the emergency room.   3. Patient to notify surgical team if experiences any of the following: Bowel/Bladder dysfunction, uncontrolled pain, nerve/muscle weakness, incision with increased drainage or redness, nausea/vomiting and Fever greater than 101.0 F.  Be alert for signs of infection including redness, streaking, odor, fever or chills. Be alert for excessive pain or bleeding and notify your surgeon immediately.  WOUND INSTRUCTIONS:   Leave your dressing, cast, or splint in place until your post operative visit.  Keep it clean and dry.  Always keep the incision clean and dry until the staples/sutures are removed. If there is no drainage from the incision you should keep it open to air. If there is drainage from the incision you must keep it covered at all times until the drainage stops  Do not soak in a bath tub, hot tub, pool, lake or other body of water until 21 days after your surgery and your incision is completely dry and healed.  If you have removable sutures (or staples) they must be removed 10-14 days  (unless otherwise instructed) from the day of your surgery.     1)  Elevate the extremity as much as possible.  2)  Keep the dressing clean and dry.  3)  Please call us  if the dressing becomes wet or dirty.  4)  If you are experiencing worsening pain or worsening swelling, please call.     MEDICATIONS: Resume all previous home medications at the previous prescribed dose and frequency unless otherwise noted Start taking the  pain medications on an as-needed basis as prescribed  Please taper down pain medication over the next week following surgery.  Ideally you should not require a refill of any narcotic pain medication.  Take pain medication with food to minimize nausea. In addition to the prescribed pain medication, you may take over-the-counter pain relievers such as Tylenol .  Do NOT take additional tylenol  if your pain medication already has tylenol  in it.  Aspirin  325mg  daily per instructions on bottle. Narcotic policy: Per [facility] clinic policy, our goal is ensure optimal postoperative pain control with a multimodal pain management strategy. For all [facility] patients, our goal is to wean post-operative narcotic medications by 6 weeks post-operatively, and many times sooner. If this is not possible due to utilization of pain medication prior to surgery, your [facility] doctor will support your acute post-operative pain control for the first 6 weeks postoperatively, with a plan to transition you back to your primary pain team following that. Max Spain will work to ensure a Therapist, occupational.       FOLLOWUP INSTRUCTIONS: 1. Follow up at the  Physical Therapy Clinic 3-4 days following surgery. This appointment should be scheduled unless other arrangements have been made.The Physical Therapy scheduling number is [phone] if an appointment has not already been arranged.  2. Contact Dr. Verline Glow office during office hours at [phone] or the practice after hours line at [phone] for  non-emergencies. For medical emergencies call 911.   Discharge Location: Home  No Tylenol  until after 1:30pm today, if needed. No Ibuprofen  until after 4:00pm today, if needed.    Post Anesthesia Home Care Instructions  Activity: Get plenty of rest for the remainder of the day. A responsible individual must stay with you for 24 hours following the procedure.  For the next 24 hours, DO NOT: -Drive a car -Advertising copywriter -Drink alcoholic beverages -Take any medication unless instructed by your physician -Make any legal decisions or sign important papers.  Meals: Start with liquid foods such as gelatin or soup. Progress to regular foods as tolerated. Avoid greasy, spicy, heavy foods. If nausea and/or vomiting occur, drink only clear liquids until the nausea and/or vomiting subsides. Call your physician if vomiting continues.  Special Instructions/Symptoms: Your throat may feel dry or sore from the anesthesia or the breathing tube placed in your throat during surgery. If this causes discomfort, gargle with warm salt water. The discomfort should disappear within 24 hours.  If you had a scopolamine patch placed behind your ear for the management of post- operative nausea and/or vomiting:  1. The medication in the patch is effective for 72 hours, after which it should be removed.  Wrap patch in a tissue and discard in the trash. Wash hands thoroughly with soap and water. 2. You may remove the patch earlier than 72 hours if you experience unpleasant side effects which may include dry mouth, dizziness or visual disturbances. 3. Avoid touching the patch. Wash your hands with soap and water after contact with the patch.

## 2023-07-02 NOTE — Transfer of Care (Signed)
 Immediate Anesthesia Transfer of Care Note  Patient: Rebecca Long  Procedure(s) Performed: REPAIR, TENDON, GLUTEUS MEDIUS, OPEN (Right: Hip)  Patient Location: PACU  Anesthesia Type:General  Level of Consciousness: awake and patient cooperative  Airway & Oxygen Therapy: Patient Spontanous Breathing and Patient connected to face mask oxygen  Post-op Assessment: Report given to RN and Post -op Vital signs reviewed and stable  Post vital signs: Reviewed and stable  Last Vitals:  Vitals Value Taken Time  BP 117/62 07/02/23 1015  Temp 36.2 C 07/02/23 1010  Pulse 86 07/02/23 1016  Resp 16 07/02/23 1016  SpO2 97 % 07/02/23 1016  Vitals shown include unfiled device data.  Last Pain:  Vitals:   07/02/23 0721  TempSrc: Temporal  PainSc: 3       Patients Stated Pain Goal: 5 (07/02/23 0721)  Complications: No notable events documented.

## 2023-07-02 NOTE — Anesthesia Procedure Notes (Signed)
 Procedure Name: Intubation Date/Time: 07/02/2023 9:17 AM  Performed by: Darcel Early, CRNAPre-anesthesia Checklist: Patient identified, Emergency Drugs available, Suction available and Patient being monitored Patient Re-evaluated:Patient Re-evaluated prior to induction Oxygen Delivery Method: Circle system utilized Preoxygenation: Pre-oxygenation with 100% oxygen Induction Type: IV induction Ventilation: Mask ventilation without difficulty Laryngoscope Size: Mac and 3 Grade View: Grade II Tube type: Oral Tube size: 6.5 mm Number of attempts: 1 Airway Equipment and Method: Stylet Placement Confirmation: ETT inserted through vocal cords under direct vision, positive ETCO2 and breath sounds checked- equal and bilateral Secured at: 21 cm Tube secured with: Tape Dental Injury: Teeth and Oropharynx as per pre-operative assessment

## 2023-07-02 NOTE — H&P (Signed)
 Chief Complaint: Right hip        History of Present Illness:      Rebecca Long is a 73 y.o. female presents today with ongoing right hip pain.  She has been experiencing this after a fall while playing pickle ball injury.  She has subsequently had pain about the lateral aspect of the hip.  She has been going to physical therapy for several years but still having significant pain and weakness.  She does walk with a cane in the left side.  She has had multiple injections without any relief.  She is here today as a further referral.       PMH/PSH/Family History/Social History/Meds/Allergies:         Past Medical History:  Diagnosis Date   Anxiety     Cavus foot, acquired 09/09/2012     bilateral cavus feet the one on the left shows that she has some probable neurogenic weakness with abnormal curvature and in inversion contracture Podiatry 06/2015: H&P and x-ray reviewed with patient. Today I went ahead and I did a proximal nerve block I was able to aspirate the second MPJ and got out amount of clear fluid and injected with a quarter cc dexamethasone  Kenalog  and ap   DDD (degenerative disc disease), lumbar     Depression     Endometrial polyp     GERD (gastroesophageal reflux disease)     Hiatal hernia     Hip flexor tendinitis, right 07/02/2016   History of adenomatous polyp of colon     History of esophageal stricture     History of gastric polyp 11/2017   History of PCR DNA positive for HSV2 11/12/2016   History of primary hyperparathyroidism      s/p  left parathryoidectomy 09-17-2008-- resolved   Hyperlipidemia     Hypertension     Insomnia     Lumbar stenosis     Osteopenia     Schatzki's ring     Scoliosis     Tendinopathy of right biceps tendon 07/02/2016   Thickened endometrium     Uterine fibroid     UTI (urinary tract infection) due to Enterococcus 01/05/2020   Wears glasses               Past Surgical History:  Procedure Laterality Date   BREAST  REDUCTION SURGERY Bilateral 2000   COLONOSCOPY       CYST EXCISION   1985    head and rt axilla   DILATATION & CURETTAGE/HYSTEROSCOPY WITH MYOSURE N/A 08/13/2018    Procedure: DILATATION & CURETTAGE/HYSTEROSCOPY WITH MYOSURE;  Surgeon: Lavoie, Marie-Lyne, MD;  Location: [facility];  Service: Gynecology;  Laterality: N/A;  requests 10:15am in [place] Gyn block request 30 minutes   DILATION AND CURETTAGE OF UTERUS N/A 08/13/2018   ECTOPIC PREGNANCY SURGERY   1981   PARATHYROIDECTOMY Left 09/17/2008   dr Shea Denier  @[facility]     left superior  (adenoma)   REDUCTION MAMMAPLASTY Bilateral 2000    years ago   SHOULDER SURGERY Right 2007    "frozen"   UPPER GASTROINTESTINAL ENDOSCOPY   last one 12-18-2017   dr Brice Campi        Social History         Socioeconomic History   Marital status: Married      Spouse name: Not on file   Number of children: 1   Years of education: Not on file   Highest education  level: Not on file  Occupational History   Not on file  Tobacco Use   Smoking status: Former      Current packs/day: 0.00      Types: Cigarettes      Start date: 01/30/1976      Quit date: 01/30/1991      Years since quitting: 32.3   Smokeless tobacco: Never  Vaping Use   Vaping status: Never Used  Substance and Sexual Activity   Alcohol use: Not Currently   Drug use: Never   Sexual activity: Not Currently      Birth control/protection: Post-menopausal      Comment: 1st intercourse- 17, partners- 10, married- 41 yrs   Other Topics Concern   Not on file  Social History Narrative    Lives with husband. Feels safe at home. 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 Some college. Former smoker. Wears seat belt.     Social Drivers of Acupuncturist Strain: Low Risk  (04/10/2023)    Overall Financial Resource Strain (CARDIA)     Difficulty of Paying Living Expenses: Not hard at all  Food Insecurity: No Food Insecurity (04/10/2023)    Hunger  Vital Sign     Worried About Running Out of Food in the Last Year: Never true     Ran Out of Food in the Last Year: Never true  Transportation Needs: No Transportation Needs (04/10/2023)    PRAPARE - Therapist, art (Medical): No     Lack of Transportation (Non-Medical): No  Physical Activity: Inactive (04/10/2023)    Exercise Vital Sign     Days of Exercise per Week: 0 days     Minutes of Exercise per Session: 0 min  Stress: No Stress Concern Present (04/10/2023)    [employer] of Occupational Health - Occupational Stress Questionnaire     Feeling of Stress : Not at all  Social Connections: Moderately Integrated (04/10/2023)    Social Connection and Isolation Panel [NHANES]     Frequency of Communication with Friends and Family: More than three times a week     Frequency of Social Gatherings with Friends and Family: More than three times a week     Attends Religious Services: Never     Database administrator or Organizations: Yes     Attends Engineer, structural: 1 to 4 times per year     Marital Status: Married         Family History  Problem Relation Age of Onset   Heart disease Father 79   Breast cancer Mother     Hypertension Brother     Hypertension Sister     Breast cancer Maternal Aunt     Colon cancer Neg Hx     Esophageal cancer Neg Hx     Stomach cancer Neg Hx     Rectal cancer Neg Hx     Colon polyps Neg Hx          Allergies      Allergies  Allergen Reactions   Latex Itching and Rash   Polysporin [Bacitracin-Polymyxin B] Rash            Current Outpatient Medications  Medication Sig Dispense Refill   atorvastatin  (LIPITOR) 20 MG tablet Take 1 tablet (20 mg total) by mouth daily. 90 tablet 3   Calcium  Carbonate-Vit D-Min (CALCIUM  1200 PO) Take by mouth.  Cholecalciferol (VITAMIN D3) 10 MCG (400 UNIT) CAPS SMARTSIG:1 Capsule(s) By Mouth       Cyanocobalamin  (VITAMIN B 12 PO) Take by mouth.       FIBER PO Take  by mouth.       FLUoxetine  (PROZAC ) 40 MG capsule Take 1 capsule (40 mg total) by mouth daily. 90 capsule 3   folic acid  (FOLVITE ) 1 MG tablet Take 1 tablet (1 mg total) by mouth daily. 90 tablet 3   omeprazole  (PRILOSEC) 40 MG capsule Take 1 capsule (40 mg total) by mouth daily. 90 capsule 3   telmisartan  (MICARDIS ) 80 MG tablet Take 1 tablet (80 mg total) by mouth daily. 90 tablet 1   valACYclovir  (VALTREX ) 500 MG tablet Take 500 mg by mouth as needed.           No current facility-administered medications for this visit.      Imaging Results (Last 48 hours)  No results found.     Review of Systems:   A ROS was performed including pertinent positives and negatives as documented in the HPI.   Physical Exam :   Constitutional: NAD and appears stated age Neurological: Alert and oriented Psych: Appropriate affect and cooperative There were no vitals taken for this visit.    Comprehensive Musculoskeletal Exam:     Tenderness about lateral trochanter with positive Trendelenburg gait.  30 degrees internal/external rotation of the right hip.  Distal neurosensory exam is in tact.  She has weakness with resisted hip abduction     Imaging:   Xray (3 views right hip): No femoral acetabular osteoarthritis   MRI (right hip): There is an end-stage full-thickness tear of the gluteus medius with significant atrophy on T1 coronal view     I personally reviewed and interpreted the radiographs.     Assessment and Plan:   73 y.o. 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  At this time she has trialed both injections as well as physical therapy for strengthening.  She is not experiencing any improvement with this.  Given this I did discuss the possibility of gluteus maximus tendon transfer given the need for transfer of a more successful muscle.  I did discuss the specific risks and limitations as well as associated recovery timeframe.  After  discussion she would like to proceed   -Plan for right hip gluteus maximus tendon transfer with collagen patch augmentation     After a lengthy discussion of treatment options, including risks, benefits, alternatives, complications of surgical and nonsurgical conservative options, the patient elected surgical repair.    The patient  is aware of the material risks  and complications including, but not limited to injury to adjacent structures, neurovascular injury, infection, numbness, bleeding, implant failure, thermal burns, stiffness, persistent pain, failure to heal, disease transmission from allograft, need for further surgery, dislocation, anesthetic risks, blood clots, risks of death,and others. The probabilities of surgical success and failure discussed with patient given their particular co-morbidities.The time and nature of expected rehabilitation and recovery was discussed.The patient's questions were all answered preoperatively.  No barriers to understanding were noted. I explained the natural history of the disease process and Rx rationale.  I explained to the patient what I considered to be reasonable expectations given their personal situation.  The final treatment plan was arrived at through a shared patient decision making process model.       I personally saw and evaluated the patient, and participated in  the management and treatment plan.   Wilhelmenia Harada, MD Attending Physician, Orthopedic Surgery   This document was dictated using Dragon voice recognition software. A reasonable attempt at proof reading has been made to minimize errors.

## 2023-07-02 NOTE — Brief Op Note (Signed)
   Brief Op Note  Date of Surgery: 07/02/2023  Preoperative Diagnosis: RIGHT GLUTEUS MEDIUS ATROPHY  Postoperative Diagnosis: same  Procedure: Procedure(s): REPAIR, TENDON, GLUTEUS MEDIUS, OPEN  Implants: Implant Name Type Inv. Item Serial No. Manufacturer Lot No. LRB No. Used Action  IMPL TAPESTRY Pixley [id number] Orthopedic Implant IMPL TAPESTRY BIOINTEGR 30X30  ZIMMER RECON(ORTH,TRAU,BIO,SG) [id number] Right 1 Implanted  ANCHOR JUGGERKNOT SOFT 1.5 - [id number] Anchor ANCHOR JUGGERKNOT SOFT 1.5  ZIMMER RECON(ORTH,TRAU,BIO,SG) [id number] Right 2 Implanted  ANCHOR SUT QUATTRO KNTLS 4.5 - [id number] Anchor ANCHOR SUT QUATTRO KNTLS 4.5  ZIMMER RECON(ORTH,TRAU,BIO,SG) [id number] Right 1 Implanted  ANCHOR SUT QUATTRO KNTLS 4.5 - [id number] Anchor ANCHOR SUT QUATTRO KNTLS 4.5  ZIMMER RECON(ORTH,TRAU,BIO,SG) [id number] Right 1 Implanted    Surgeons: Surgeon(s): Wilhelmenia Harada, MD  Anesthesia: General    Estimated Blood Loss: See anesthesia record  Complications: None  Condition to PACU: Stable  Carmina Chris, MD 07/02/2023 10:03 AM

## 2023-07-02 NOTE — Interval H&P Note (Signed)
 History and Physical Interval Note:  07/02/2023 7:13 AM  Rebecca Long  has presented today for surgery, with the diagnosis of RIGHT GLUTEUS MEDIUS ATROPHY.  The various methods of treatment have been discussed with the patient and family. After consideration of risks, benefits and other options for treatment, the patient has consented to  Procedure(s) with comments: REPAIR, TENDON, GLUTEUS MEDIUS, OPEN (Right) - RIGHT GLUTEUS MAXIMUS TENDON TRANSFER WITH COLLAGEN PATCH AUGMENTATION as a surgical intervention.  The patient's history has been reviewed, patient examined, no change in status, stable for surgery.  I have reviewed the patient's chart and labs.  Questions were answered to the patient's satisfaction.     Sybel Standish

## 2023-07-02 NOTE — Op Note (Signed)
 Date of Surgery: 07/02/2023  INDICATIONS: [name] is a 73 y.o.-year-old female with right hip gluteus medius tear.  The risk and benefits of the procedure were discussed in detail and documented in the pre-operative evaluation.   PREOPERATIVE DIAGNOSIS: 1.  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  PROCEDURE: 1.  Right hip gluteus maximus tendon transfer with collagen patch augmentation  SURGEON: Carmina Chris MD  ASSISTANT: Deon Flatter, ATC  ANESTHESIA:  general  IV FLUIDS AND URINE: See anesthesia record.  ANTIBIOTICS: Ancef   ESTIMATED BLOOD LOSS: 10 mL.  IMPLANTS:  Implant Name Type Inv. Item Serial No. Manufacturer Lot No. LRB No. Used Action  IMPL TAPESTRY Tripp 30X30 - [id number] Orthopedic Implant IMPL TAPESTRY BIOINTEGR 30X30  ZIMMER RECON(ORTH,TRAU,BIO,SG) [id number] Right 1 Implanted  ANCHOR JUGGERKNOT SOFT 1.5 - [id number] Anchor ANCHOR JUGGERKNOT SOFT 1.5  ZIMMER RECON(ORTH,TRAU,BIO,SG) [id number] Right 2 Implanted  ANCHOR SUT QUATTRO KNTLS 4.5 - [id number] Anchor ANCHOR SUT QUATTRO KNTLS 4.5  ZIMMER RECON(ORTH,TRAU,BIO,SG) [id number] Right 1 Implanted  ANCHOR SUT QUATTRO KNTLS 4.5 - [id number] Anchor ANCHOR SUT QUATTRO KNTLS 4.5  ZIMMER RECON(ORTH,TRAU,BIO,SG) [id number] Right 1 Implanted    DRAINS: None  CULTURES: None  COMPLICATIONS: none  DESCRIPTION OF PROCEDURE:   The patient was identified in the preoperative holding area.  Antibiotics were given 1 hour prior to skin incision.  Timeout was performed and surgical site was marked with nursing.  The patient was subsequently taken back to the operating room.  Anesthesia was induced. The patient was placed in the lateral decubitus position.  Axillary roll and down peroneal nerve was padded well   At this time I began with an approach to the lateral trochanter centered at the vastus ridge.  15 blade was used to incise skin.  This was done down to the level of the IT band.   Electrocautery was utilized to achieve hemostasis.  At this time the IT band was marked in such a way as to preserve an anterior flap.  This was incised completely with a of an abduction.  Gluteus medius was completely torn and found to be nonviable with significant fatty infiltration.  At this time a flap was mobilized anteriorly from the gluteus maximus in a triangular fashion.  This mobilized well over to the trochanter. The trochanteric bursectomy was seen and excises with Metzenbaums.   A double row construct was utilized with 2 medial row all suture anchors.  These were brought up through the remaining gluteus medius tendon and into the flap.  2 of these were tied to provisionally stabilize the flap.  These were then brought up to the distal most aspect of the flap and brought into 2 lateral row anchors over the native footprint of the gluteus medius.  Given the quality of the tendon the decision was made to supplement with a collagen patch.  This was sutured in the corners into the native insertion of the gluteus medius.   This time the wound was thoroughly irrigated.  The posterior aspect of the IT band was sutured to the posterior limb of the flap.  This was done with 0 Vicryl.  This was done in an interrupted fashion.  The wound was again irrigated and closed in layers of 0 Vicryl, 2-0 Vicryl and 3-0 nylon.  Xeroform, gauze, Aquacel dressing were applied.   Instrument, sponge, and needle counts were correct prior to wound closure and at the conclusion of the case.  The patient was taken to the  PACU without complication         POSTOPERATIVE PLAN: The patient will be weightbearing as tolerated.  She will be placed on Aspirin  for blood clot prevention.  I will see the patient back in 2 weeks.     Carmina Chris, MD 10:04 AM

## 2023-07-03 ENCOUNTER — Other Ambulatory Visit (HOSPITAL_BASED_OUTPATIENT_CLINIC_OR_DEPARTMENT_OTHER): Payer: Self-pay | Admitting: Orthopaedic Surgery

## 2023-07-03 ENCOUNTER — Encounter (HOSPITAL_BASED_OUTPATIENT_CLINIC_OR_DEPARTMENT_OTHER): Payer: Self-pay | Admitting: Orthopaedic Surgery

## 2023-07-03 DIAGNOSIS — S76011A Strain of muscle, fascia and tendon of right hip, initial encounter: Secondary | ICD-10-CM

## 2023-07-05 DIAGNOSIS — M545 Low back pain, unspecified: Secondary | ICD-10-CM | POA: Diagnosis not present

## 2023-07-12 ENCOUNTER — Ambulatory Visit: Attending: Orthopaedic Surgery | Admitting: Physical Therapy

## 2023-07-12 ENCOUNTER — Ambulatory Visit (INDEPENDENT_AMBULATORY_CARE_PROVIDER_SITE_OTHER): Admitting: Orthopaedic Surgery

## 2023-07-12 ENCOUNTER — Other Ambulatory Visit: Payer: Self-pay

## 2023-07-12 ENCOUNTER — Encounter: Payer: Self-pay | Admitting: Physical Therapy

## 2023-07-12 DIAGNOSIS — M6281 Muscle weakness (generalized): Secondary | ICD-10-CM | POA: Diagnosis not present

## 2023-07-12 DIAGNOSIS — R2689 Other abnormalities of gait and mobility: Secondary | ICD-10-CM | POA: Insufficient documentation

## 2023-07-12 DIAGNOSIS — M25551 Pain in right hip: Secondary | ICD-10-CM | POA: Diagnosis not present

## 2023-07-12 DIAGNOSIS — S76011A Strain of muscle, fascia and tendon of right hip, initial encounter: Secondary | ICD-10-CM

## 2023-07-12 NOTE — Progress Notes (Signed)
 Post Operative Evaluation    Procedure/Date of Surgery: Right hip gluteus maximus tendon transfer 6/3  Interval History:    Presents today 2 weeks status post the above procedure.  She has physical therapy pending at the [facility] location.  Overall she is having some pain about the lateral aspect of the hip hip.  She is walking without any assistive devices.   PMH/PSH/Family History/Social History/Meds/Allergies:    Past Medical History:  Diagnosis Date   Anxiety    Cavus foot, acquired 09/09/2012    bilateral cavus feet the one on the left shows that she has some probable neurogenic weakness with abnormal curvature and in inversion contracture Podiatry 06/2015: H&P and x-ray reviewed with patient. Today I went ahead and I did a proximal nerve block I was able to aspirate the second MPJ and got out amount of clear fluid and injected with a quarter cc dexamethasone  Kenalog  and ap   DDD (degenerative disc disease), lumbar    Depression    Endometrial polyp    GERD (gastroesophageal reflux disease)    Hiatal hernia    Hip flexor tendinitis, right 07/02/2016   History of adenomatous polyp of colon    History of esophageal stricture    History of gastric polyp 11/2017   History of PCR DNA positive for HSV2 11/12/2016   History of primary hyperparathyroidism    s/p  left parathryoidectomy 09-17-2008-- resolved   Hyperlipidemia    Hypertension    Insomnia    Lumbar stenosis    Osteopenia    PONV (postoperative nausea and vomiting)    Schatzki's ring    Scoliosis    Tendinopathy of right biceps tendon 07/02/2016   Thickened endometrium    Uterine fibroid    UTI (urinary tract infection) due to Enterococcus 01/05/2020   Wears glasses    Past Surgical History:  Procedure Laterality Date   BREAST REDUCTION SURGERY Bilateral 2000   COLONOSCOPY     CYST EXCISION  1985   head and rt axilla   DILATATION & CURETTAGE/HYSTEROSCOPY WITH MYOSURE N/A  08/13/2018   Procedure: DILATATION & CURETTAGE/HYSTEROSCOPY WITH MYOSURE;  Surgeon: Lavoie, Marie-Lyne, MD;  Location: [facility];  Service: Gynecology;  Laterality: N/A;  requests 10:15am in [place] Gyn block request 30 minutes   DILATION AND CURETTAGE OF UTERUS N/A 08/13/2018   ECTOPIC PREGNANCY SURGERY  1981   OPEN SURGICAL REPAIR OF GLUTEAL TENDON Right 07/02/2023   Procedure: REPAIR, TENDON, GLUTEUS MEDIUS, OPEN;  Surgeon: Wilhelmenia Harada, MD;  Location: [facility];  Service: Orthopedics;  Laterality: Right;  RIGHT GLUTEUS MAXIMUS TENDON TRANSFER WITH COLLAGEN PATCH AUGMENTATION   PARATHYROIDECTOMY Left 09/17/2008   dr Shea Denier  @[facility]    left superior  (adenoma)   REDUCTION MAMMAPLASTY Bilateral 2000   years ago   SHOULDER SURGERY Right 2007   frozen   UPPER GASTROINTESTINAL ENDOSCOPY  last one 12-18-2017   dr Brice Campi   Social History   Socioeconomic History   Marital status: Married    Spouse name: Not on file   Number of children: 1   Years of education: Not on file   Highest education level: Not on file  Occupational History   Not on file  Tobacco Use   Smoking status: Former    Current packs/day: 0.00  Types: Cigarettes    Start date: 01/30/1976    Quit date: 01/30/1991    Years since quitting: 32.4   Smokeless tobacco: Never  Vaping Use   Vaping status: Never Used  Substance and Sexual Activity   Alcohol use: Not Currently   Drug use: Never   Sexual activity: Not Currently    Birth control/protection: Post-menopausal    Comment: 1st intercourse- 17, partners- 10, married- 41 yrs   Other Topics Concern   Not on file  Social History Narrative   Lives with husband. Feels safe at home. 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 [employer]. Some college. Former smoker. Wears seat belt.    Social Drivers of Corporate investment banker Strain: Low Risk  (04/10/2023)   Overall Financial Resource Strain (CARDIA)    Difficulty of Paying  Living Expenses: Not hard at all  Food Insecurity: No Food Insecurity (04/10/2023)   Hunger Vital Sign    Worried About Running Out of Food in the Last Year: Never true    Ran Out of Food in the Last Year: Never true  Transportation Needs: No Transportation Needs (04/10/2023)   PRAPARE - Administrator, Civil Service (Medical): No    Lack of Transportation (Non-Medical): No  Physical Activity: Inactive (04/10/2023)   Exercise Vital Sign    Days of Exercise per Week: 0 days    Minutes of Exercise per Session: 0 min  Stress: No Stress Concern Present (04/10/2023)   [employer] of Occupational Health - Occupational Stress Questionnaire    Feeling of Stress : Not at all  Social Connections: Moderately Integrated (04/10/2023)   Social Connection and Isolation Panel    Frequency of Communication with Friends and Family: More than three times a week    Frequency of Social Gatherings with Friends and Family: More than three times a week    Attends Religious Services: Never    Database administrator or Organizations: Yes    Attends Engineer, structural: 1 to 4 times per year    Marital Status: Married   Family History  Problem Relation Age of Onset   Breast cancer Mother 68   Heart disease Father 58   Hypertension Sister    Breast cancer Maternal Aunt    Breast cancer Cousin 60   Hypertension Brother    Colon cancer Neg Hx    Esophageal cancer Neg Hx    Stomach cancer Neg Hx    Rectal cancer Neg Hx    Colon polyps Neg Hx    Allergies  Allergen Reactions   Latex Itching and Rash   Polysporin [Bacitracin-Polymyxin B] Rash   Current Outpatient Medications  Medication Sig Dispense Refill   aspirin  EC 325 MG tablet Take 1 tablet (325 mg total) by mouth daily. 14 tablet 0   atorvastatin  (LIPITOR) 20 MG tablet Take 1 tablet (20 mg total) by mouth daily. 90 tablet 3   Calcium  Carbonate-Vit D-Min (CALCIUM  1200 PO) Take by mouth.     Cholecalciferol (VITAMIN D3) 10  MCG (400 UNIT) CAPS SMARTSIG:1 Capsule(s) By Mouth     FLUoxetine  (PROZAC ) 40 MG capsule Take 1 capsule (40 mg total) by mouth daily. 90 capsule 3   folic acid  (FOLVITE ) 1 MG tablet Take 1 tablet (1 mg total) by mouth daily. 90 tablet 3   omeprazole  (PRILOSEC) 40 MG capsule Take 1 capsule (40 mg total) by mouth daily. 90 capsule 3   oxyCODONE  (ROXICODONE ) 5 MG immediate release tablet  Take 1 tablet (5 mg total) by mouth every 4 (four) hours as needed for severe pain (pain score 7-10) or breakthrough pain. 15 tablet 0   telmisartan  (MICARDIS ) 80 MG tablet Take 1 tablet (80 mg total) by mouth daily. 90 tablet 1   valACYclovir  (VALTREX ) 500 MG tablet Take 500 mg by mouth as needed.      No current facility-administered medications for this visit.   No results found.  Review of Systems:   A ROS was performed including pertinent positives and negatives as documented in the HPI.   Musculoskeletal Exam:    There were no vitals taken for this visit.  Right hip incision is well-appearing without erythema or drainage.  Distal neurosensory exam is intact.  Internal/external rotation of the right hip without pain.  There is Trendelenburg gait  Imaging:      I personally reviewed and interpreted the radiographs.   Assessment:   2-week status post right hip gluteus maximus tendon transfer.  Today's visit I did discuss that she would benefit from physical therapy guiding her on usage of the cane in order to equalize her balance and to start strengthening of the right hip.  I will plan to see her back in 4 weeks for reassessment  Plan :    - Turn to clinic 4 weeks for reassessment      I personally saw and evaluated the patient, and participated in the management and treatment plan.  Wilhelmenia Harada, MD Attending Physician, Orthopedic Surgery  This document was dictated using Dragon voice recognition software. A reasonable attempt at proof reading has been made to minimize errors.

## 2023-07-12 NOTE — Therapy (Signed)
 OUTPATIENT PHYSICAL THERAPY HIP EVALUATION - POST-OP   Patient Name: Rebecca Long MRN: [id number] DOB:19-Sep-1950, 73 y.o., female Today's Date: 07/12/2023  END OF SESSION:  PT End of Session - 07/12/23 1213     Visit Number 1    Date for PT Re-Evaluation 09/06/23    Authorization Type HTA no auth required    PT Start Time 1030   Pt late from another appointment   PT Stop Time 1110    PT Time Calculation (min) 40 min    Activity Tolerance Patient tolerated treatment well    Behavior During Therapy [facility] for tasks assessed/performed          Past Medical History:  Diagnosis Date   Anxiety    Cavus foot, acquired 09/09/2012    bilateral cavus feet the one on the left shows that she has some probable neurogenic weakness with abnormal curvature and in inversion contracture Podiatry 06/2015: H&P and x-ray reviewed with patient. Today I went ahead and I did a proximal nerve block I was able to aspirate the second MPJ and got out amount of clear fluid and injected with a quarter cc dexamethasone  Kenalog  and ap   DDD (degenerative disc disease), lumbar    Depression    Endometrial polyp    GERD (gastroesophageal reflux disease)    Hiatal hernia    Hip flexor tendinitis, right 07/02/2016   History of adenomatous polyp of colon    History of esophageal stricture    History of gastric polyp 11/2017   History of PCR DNA positive for HSV2 11/12/2016   History of primary hyperparathyroidism    s/p  left parathryoidectomy 09-17-2008-- resolved   Hyperlipidemia    Hypertension    Insomnia    Lumbar stenosis    Osteopenia    PONV (postoperative nausea and vomiting)    Schatzki's ring    Scoliosis    Tendinopathy of right biceps tendon 07/02/2016   Thickened endometrium    Uterine fibroid    UTI (urinary tract infection) due to Enterococcus 01/05/2020   Wears glasses    Past Surgical History:  Procedure Laterality Date   BREAST REDUCTION SURGERY Bilateral 2000   COLONOSCOPY      CYST EXCISION  1985   head and rt axilla   DILATATION & CURETTAGE/HYSTEROSCOPY WITH MYOSURE N/A 08/13/2018   Procedure: DILATATION & CURETTAGE/HYSTEROSCOPY WITH MYOSURE;  Surgeon: Lavoie, Marie-Lyne, MD;  Location: [facility];  Service: Gynecology;  Laterality: N/A;  requests 10:15am in [place] Gyn block request 30 minutes   DILATION AND CURETTAGE OF UTERUS N/A 08/13/2018   ECTOPIC PREGNANCY SURGERY  1981   OPEN SURGICAL REPAIR OF GLUTEAL TENDON Right 07/02/2023   Procedure: REPAIR, TENDON, GLUTEUS MEDIUS, OPEN;  Surgeon: Wilhelmenia Harada, MD;  Location: [facility];  Service: Orthopedics;  Laterality: Right;  RIGHT GLUTEUS MAXIMUS TENDON TRANSFER WITH COLLAGEN PATCH AUGMENTATION   PARATHYROIDECTOMY Left 09/17/2008   dr Shea Denier  @[facility]    left superior  (adenoma)   REDUCTION MAMMAPLASTY Bilateral 2000   years ago   SHOULDER SURGERY Right 2007   frozen   UPPER GASTROINTESTINAL ENDOSCOPY  last one 12-18-2017   dr Brice Campi   Patient Active Problem List   Diagnosis Date Noted   Tendinopathy of gluteus medius 07/02/2023   Greater trochanteric bursitis of right hip 04/17/2022   C. difficile diarrhea 03/08/2022   Arthritis of right sacroiliac joint (HCC) 02/26/2022   Encounter for monitoring long-term proton pump inhibitor therapy 07/05/2021  Hx of colonic polyps 06/06/2021   Incontinence of feces with fecal urgency 06/06/2021   Sleep disorder 11/29/2020   Benign paroxysmal positional vertigo of right ear 08/30/2020   AC (acromioclavicular) arthritis 10/22/2019   Adhesive bursitis of left shoulder 07/22/2019   GERD with esophagitis 12/30/2017   Hyperlipidemia 05/15/2017   Morbid obesity (HCC) 11/12/2016   Essential hypertension, benign 10/06/2014   Depression with anxiety 10/06/2014   Stress incontinence 10/06/2014   Osteoporosis 10/06/2014   Degenerative disc disease, lumbar 09/09/2012   Spinal stenosis of lumbar region 09/09/2012    PCP: Napolean Backbone, DO  REFERRING PROVIDER: Wilhelmenia Harada, MD  REFERRING DIAG: post-op REPAIR, TENDON, GLUTEUS MEDIUS, OPEN (Right) - RIGHT GLUTEUS MAXIMUS TENDON TRANSFER WITH COLLAGEN PATCH AUGMENTATION  Rationale for Evaluation and Treatment: Rehabilitation  THERAPY DIAG:  Muscle weakness (generalized)  Other abnormalities of gait and mobility  Pain in right hip  ONSET DATE: 07/02/23 surgery date - chronic pain and weakness prior to surgery  SUBJECTIVE:                                                                                                                                                                                           SUBJECTIVE STATEMENT: Pt is 10 days s/p Rt glut med tendon repair with glut max tendon transfer performed by Dr. Hermina Loosen on 07/02/23.   Pt had old tear of glut min, cortisone shots, tore glut med in Sept when pushed off from baseline while playing pickleball.  Had pain, weakness, started using walking sticks.  Now down to one walking stick.      PERTINENT HISTORY:    PAIN:  PAIN:  Are you having pain? Yes NPRS scale: 0-2/10 Pain location: Rt lateral hip Pain orientation: Right  PAIN TYPE: aching Pain description: intermittent, dull, and aching  Aggravating factors: laying on Rt side, getting in/out bed, in/out of car, walking Relieving factors: sitting   PRECAUTIONS: Other: protocol from Dr. Hermina Loosen scanned into media section of chart - avoid until 6 weeks post-op which is 08/13/23: passive ADDuction and ER, active ABDuction and IR  RED FLAGS: None   WEIGHT BEARING RESTRICTIONS: PWB initially, doctor just cleared to get rid of walking stick when ready  FALLS:  Has patient fallen in last 6 months? Yes. Number of falls 1, while carrying a planter and going up stairs  LIVING ENVIRONMENT: Lives with: lives with their spouse Lives in: House/apartment Stairs: Yes: External: 3 steps; on left going up Has following equipment at home: Single point  cane  OCCUPATION: retired  PLOF: Independent  PATIENT GOALS: walk without pain and walking stick, can I get  back to pickleball?  NEXT MD VISIT:  OBJECTIVE:  Note: Objective measures were completed at Evaluation unless otherwise noted.  DIAGNOSTIC FINDINGS:  Xray (3 views right hip): No femoral acetabular osteoarthritis   MRI (right hip): There is an end-stage full-thickness tear of the gluteus medius with significant atrophy on T1 coronal view  PATIENT SURVEYS:  LEFS: 44/80     COGNITION: Overall cognitive status: Within functional limits for tasks assessed     SENSATION: WFL  MUSCLE LENGTH:   POSTURE: flexed trunk   PALPATION: Mild tenderness around incision - steri strips placed today after stitches were removed  LUMBAR ROM:  WFL  LOWER EXTREMITY ROM:    Pt able to perform seated march A/ROM and quadruped rocking for hip flexion beyond 90 deg without pain  LOWER EXTREMITY MMT:   Lt LE 4+/5 Rt hip at least 3+/5 - isometric testing only, resisted isometrics strong and painfree   FUNCTIONAL TESTS:  5 times sit to stand: 13.23 Timed up and go (TUG): 12.24 using walking stick 2 MWT: 315 feet using single walking stick in Lt UE  GAIT: Distance walked: 315' in 2 min  Assistive device utilized: single walking stick Level of assistance: Modified independence Comments: Rt early stance phase Trendelenburg  TREATMENT DATE:  07/12/23: Initiated HEP Reviewed motions to avoid per protocol from MD Gait training: Lt UE for walking stick paired with Rt foot, work on engaging glutes early in stance phase to control lateral hip                                                                                                                                  PATIENT EDUCATION:  Education details: [id number] Person educated: Patient Education method: Explanation, Demonstration, Verbal cues, and Handouts Education comprehension: verbalized understanding and returned  demonstration  HOME EXERCISE PROGRAM: Access Code: [email or web address] URL: [email or web address] Date: 07/12/2023 Prepared by: Minor Amble Naia Ruff  Exercises - Supine Posterior Pelvic Tilt  - 2 x daily - 7 x weekly - 2 sets - 10 reps - Supine Short Arc Quad  - 2 x daily - 7 x weekly - 2 sets - 10 reps - Supine Hip Adduction Isometric with Ball  - 2 x daily - 7 x weekly - 2 sets - 10 reps - 5 hold - Quadruped Rocking Backward  - 2 x daily - 7 x weekly - 1-2 sets - 10 reps - 5 hold - Seated Isometric Hip Abduction  - 2 x daily - 7 x weekly - 2 sets - 10 reps - 5 hold - Standing Knee Flexion AROM with Chair Support  - 2 x daily - 7 x weekly - 2 sets - 10 reps  ASSESSMENT:  CLINICAL IMPRESSION: Patient is a 73 y.o. female who was seen today for physical therapy evaluation and treatment for Rt hip weakness following Rt glut med tendon repair with glut max tendon transfer.  Surgery was 10 days ago.  Surgeon has cleared her for gait as tol without AD - she is currently using a single walking stick.  She presents with + Rt Trendelenburg.  She has minimal pain.  Strength of Rt hip is at least 3+/5.  ROM is grossly WFL for trunk and bil LE.  She was able to perform a covering 315' and has relatively low fall risk per TUG and 5x STS.  She has a few stairs to access her home but otherwise lives on main level.  She would like to work towards walking without AD or pain, and wonders if she might be able to someday return to pickleball.  She will benefit from skilled PT to address findings and maximize participation in daily activities.   OBJECTIVE IMPAIRMENTS: Abnormal gait, decreased balance, decreased coordination, difficulty walking, decreased strength, increased edema, impaired flexibility, improper body mechanics, postural dysfunction, and pain.   ACTIVITY LIMITATIONS: lifting, bending, squatting, stairs, and locomotion level  PARTICIPATION LIMITATIONS: cleaning, laundry, shopping, community  activity, and yard work  PERSONAL FACTORS: Age are also affecting patient's functional outcome.   REHAB POTENTIAL: Excellent  CLINICAL DECISION MAKING: Evolving/moderate complexity  EVALUATION COMPLEXITY: Moderate   GOALS: Goals reviewed with patient? Yes  SHORT TERM GOALS: Target date: 08/09/23  Pt will be ind with initial HEP Baseline: Goal status: INITIAL  2.  Pt will be educated on movements to avoid to follow post-op protocol to protect tendon transfer site Baseline:  Goal status: INITIAL  3.  Pt will demo improved Rt hip control in stance phase of gait with single walking stick as needed. Baseline:  Goal status: INITIAL    LONG TERM GOALS: Target date: 09/06/23  Pt will be ind with advanced HEP Baseline:  Goal status: INITIAL  2.  Pt will improve LEFS score to at least 55/80 to demo improved function Baseline: 44/80 Goal status: INITIAL  3.  Pt will perform 6 min walk test covering at least 700' with walking stick as needed Baseline:  Goal status: INITIAL  4.  Pt will be able to demo squat, lift 10lb, carry 10lb and walk to mimic household chores and yard activities with proper body mechanics without pain. Baseline:  Goal status: INITIAL  5.  Pt will improve glut strength to at least 4+/5 to improve gait mechanics, stairs and transfers. Baseline:  Goal status: INITIAL  6.  5x STS improved to 11 sec or less to demo improved functional strength and reduce fall risk. Baseline: 13.23 Goal status: INITIAL  PLAN:  PT FREQUENCY: 2x/week  PT DURATION: 8 weeks  PLANNED INTERVENTIONS: 97110-Therapeutic exercises, 97530- Therapeutic activity, [id number]- Neuromuscular re-education, 97535- Self Care, [id number]- Manual therapy, [phone]- Gait training, Patient/Family education, Balance training, Stair training, Cryotherapy, and Moist heat.  PLAN FOR NEXT SESSION: monitor incision for healing and start scar mobs Rt hip, review HEP, gait training with walking stick which can  be weaned with proper mechanics when Pt is ready, follow protocol scanned into media section of chart from Dr. Rosalynd Combs Shawan Corella, PT 07/12/23 12:36 PM

## 2023-07-16 ENCOUNTER — Ambulatory Visit

## 2023-07-16 DIAGNOSIS — R252 Cramp and spasm: Secondary | ICD-10-CM

## 2023-07-16 DIAGNOSIS — M25551 Pain in right hip: Secondary | ICD-10-CM

## 2023-07-16 DIAGNOSIS — R262 Difficulty in walking, not elsewhere classified: Secondary | ICD-10-CM

## 2023-07-16 DIAGNOSIS — R293 Abnormal posture: Secondary | ICD-10-CM

## 2023-07-16 DIAGNOSIS — M6281 Muscle weakness (generalized): Secondary | ICD-10-CM

## 2023-07-16 NOTE — Therapy (Signed)
 OUTPATIENT PHYSICAL THERAPY HIP EVALUATION - POST-OP   Patient Name: Rebecca Long MRN: [id number] DOB:1950-04-15, 73 y.o., female Today's Date: 07/16/2023  END OF SESSION:  PT End of Session - 07/16/23 1020     Visit Number 2    Date for PT Re-Evaluation 09/06/23    Authorization Type HTA no auth required    PT Start Time 1020    PT Stop Time 1101    PT Time Calculation (min) 41 min    Activity Tolerance Patient tolerated treatment well    Behavior During Therapy WFL for tasks assessed/performed          Past Medical History:  Diagnosis Date   Anxiety    Cavus foot, acquired 09/09/2012    bilateral cavus feet the one on the left shows that she has some probable neurogenic weakness with abnormal curvature and in inversion contracture Podiatry 06/2015: H&P and x-ray reviewed with patient. Today I went ahead and I did a proximal nerve block I was able to aspirate the second MPJ and got out amount of clear fluid and injected with a quarter cc dexamethasone  Kenalog  and ap   DDD (degenerative disc disease), lumbar    Depression    Endometrial polyp    GERD (gastroesophageal reflux disease)    Hiatal hernia    Hip flexor tendinitis, right 07/02/2016   History of adenomatous polyp of colon    History of esophageal stricture    History of gastric polyp 11/2017   History of PCR DNA positive for HSV2 11/12/2016   History of primary hyperparathyroidism    s/p  left parathryoidectomy 09-17-2008-- resolved   Hyperlipidemia    Hypertension    Insomnia    Lumbar stenosis    Osteopenia    PONV (postoperative nausea and vomiting)    Schatzki's ring    Scoliosis    Tendinopathy of right biceps tendon 07/02/2016   Thickened endometrium    Uterine fibroid    UTI (urinary tract infection) due to Enterococcus 01/05/2020   Wears glasses    Past Surgical History:  Procedure Laterality Date   BREAST REDUCTION SURGERY Bilateral 2000   COLONOSCOPY     CYST EXCISION  1985   head and rt  axilla   DILATATION & CURETTAGE/HYSTEROSCOPY WITH MYOSURE N/A 08/13/2018   Procedure: DILATATION & CURETTAGE/HYSTEROSCOPY WITH MYOSURE;  Surgeon: Lavoie, Marie-Lyne, MD;  Location: [facility];  Service: Gynecology;  Laterality: N/A;  requests 10:15am in [place] Gyn block request 30 minutes   DILATION AND CURETTAGE OF UTERUS N/A 08/13/2018   ECTOPIC PREGNANCY SURGERY  1981   OPEN SURGICAL REPAIR OF GLUTEAL TENDON Right 07/02/2023   Procedure: REPAIR, TENDON, GLUTEUS MEDIUS, OPEN;  Surgeon: Wilhelmenia Harada, MD;  Location: [facility];  Service: Orthopedics;  Laterality: Right;  RIGHT GLUTEUS MAXIMUS TENDON TRANSFER WITH COLLAGEN PATCH AUGMENTATION   PARATHYROIDECTOMY Left 09/17/2008   dr Shea Denier  @[facility]    left superior  (adenoma)   REDUCTION MAMMAPLASTY Bilateral 2000   years ago   SHOULDER SURGERY Right 2007   frozen   UPPER GASTROINTESTINAL ENDOSCOPY  last one 12-18-2017   dr Brice Campi   Patient Active Problem List   Diagnosis Date Noted   Tendinopathy of gluteus medius 07/02/2023   Greater trochanteric bursitis of right hip 04/17/2022   C. difficile diarrhea 03/08/2022   Arthritis of right sacroiliac joint (HCC) 02/26/2022   Encounter for monitoring long-term proton pump inhibitor therapy 07/05/2021   Hx of colonic polyps 06/06/2021  Incontinence of feces with fecal urgency 06/06/2021   Sleep disorder 11/29/2020   Benign paroxysmal positional vertigo of right ear 08/30/2020   AC (acromioclavicular) arthritis 10/22/2019   Adhesive bursitis of left shoulder 07/22/2019   GERD with esophagitis 12/30/2017   Hyperlipidemia 05/15/2017   Morbid obesity (HCC) 11/12/2016   Essential hypertension, benign 10/06/2014   Depression with anxiety 10/06/2014   Stress incontinence 10/06/2014   Osteoporosis 10/06/2014   Degenerative disc disease, lumbar 09/09/2012   Spinal stenosis of lumbar region 09/09/2012    PCP: Napolean Backbone, DO  REFERRING PROVIDER:  Wilhelmenia Harada, MD  REFERRING DIAG: post-op REPAIR, TENDON, GLUTEUS MEDIUS, OPEN (Right) - RIGHT GLUTEUS MAXIMUS TENDON TRANSFER WITH COLLAGEN PATCH AUGMENTATION  Rationale for Evaluation and Treatment: Rehabilitation  THERAPY DIAG:  Muscle weakness (generalized)  Pain in right hip  Difficulty in walking, not elsewhere classified  Abnormal posture  Cramp and spasm  ONSET DATE: 07/02/23 surgery date - chronic pain and weakness prior to surgery  SUBJECTIVE:                                                                                                                                                                                           SUBJECTIVE STATEMENT: Pt is 2 weeks post op.  She reports she was sore after last visit but expected that.  Doing ok other than that.   PERTINENT HISTORY:    PAIN:  PAIN:  07/16/23 Are you having pain? Yes NPRS scale: 0-2/10 Pain location: Rt lateral hip Pain orientation: Right  PAIN TYPE: aching Pain description: intermittent, dull, and aching  Aggravating factors: laying on Rt side, getting in/out bed, in/out of car, walking Relieving factors: sitting   PRECAUTIONS: Other: protocol from Dr. Hermina Loosen scanned into media section of chart - avoid until 6 weeks post-op which is 08/13/23: passive ADDuction and ER, active ABDuction and IR  RED FLAGS: None   WEIGHT BEARING RESTRICTIONS: PWB initially, doctor just cleared to get rid of walking stick when ready  FALLS:  Has patient fallen in last 6 months? Yes. Number of falls 1, while carrying a planter and going up stairs  LIVING ENVIRONMENT: Lives with: lives with their spouse Lives in: House/apartment Stairs: Yes: External: 3 steps; on left going up Has following equipment at home: Single point cane  OCCUPATION: retired  PLOF: Independent  PATIENT GOALS: walk without pain and walking stick, can I get back to pickleball?  NEXT MD VISIT:  OBJECTIVE:  Note: Objective measures were  completed at Evaluation unless otherwise noted.  DIAGNOSTIC FINDINGS:  Xray (3 views right hip): No femoral acetabular osteoarthritis   MRI (right hip):  There is an end-stage full-thickness tear of the gluteus medius with significant atrophy on T1 coronal view  PATIENT SURVEYS:  LEFS: 44/80     COGNITION: Overall cognitive status: Within functional limits for tasks assessed     SENSATION: WFL  MUSCLE LENGTH:   POSTURE: flexed trunk   PALPATION: Mild tenderness around incision - steri strips placed today after stitches were removed  LUMBAR ROM:  WFL  LOWER EXTREMITY ROM:    Pt able to perform seated march A/ROM and quadruped rocking for hip flexion beyond 90 deg without pain  LOWER EXTREMITY MMT:   Lt LE 4+/5 Rt hip at least 3+/5 - isometric testing only, resisted isometrics strong and painfree   FUNCTIONAL TESTS:  5 times sit to stand: 13.23 Timed up and go (TUG): 12.24 using walking stick 2 MWT: 315 feet using single walking stick in Lt UE  GAIT: Distance walked: 315' in 2 min  Assistive device utilized: single walking stick Level of assistance: Modified independence Comments: Rt early stance phase Trendelenburg  TREATMENT DATE:  07/16/23: Reviewed HEP SAQ (added 2 lb ankle weight) 2 x 10 PPT x 20 Supine Hip Adduction Isometric with Ball x 20 Quadruped Rocking Backward 2 x 10 Seated Isometric Hip Abduction 2 x 10 holding 5 sec Standing Knee Flexion AROM with Chair Support 2 x 10 holding 5 sec Reviewed motions to avoid per protocol from MD Gait training: Lt UE for walking stick paired with Rt foot, work on engaging glutes early in stance phase to control lateral hip x 150 feet Ice to right hip post treatment x 10 min  07/12/23: Initiated HEP Reviewed motions to avoid per protocol from MD Gait training: Lt UE for walking stick paired with Rt foot, work on engaging glutes early in stance phase to control lateral hip                                                                                                                                   PATIENT EDUCATION:  Education details: [email or web address] Person educated: Patient Education method: Explanation, Demonstration, Verbal cues, and Handouts Education comprehension: verbalized understanding and returned demonstration  HOME EXERCISE PROGRAM: Access Code: [email or web address] URL: [email or web address] Date: 07/12/2023 Prepared by: Minor Amble Beuhring  Exercises - Supine Posterior Pelvic Tilt  - 2 x daily - 7 x weekly - 2 sets - 10 reps - Supine Short Arc Quad  - 2 x daily - 7 x weekly - 2 sets - 10 reps - Supine Hip Adduction Isometric with Ball  - 2 x daily - 7 x weekly - 2 sets - 10 reps - 5 hold - Quadruped Rocking Backward  - 2 x daily - 7 x weekly - 1-2 sets - 10 reps - 5 hold - Seated Isometric Hip Abduction  - 2 x daily - 7 x weekly - 2 sets - 10 reps - 5 hold - Standing Knee Flexion AROM with Chair Support  -  2 x daily - 7 x weekly - 2 sets - 10 reps  ASSESSMENT:  CLINICAL IMPRESSION: Shivon was a little sore after her first visit but this was expected.  She was able to do her HEP with minimal verbal cues indicating good compliance.  She was able to ambulate with level pelvis today but did admit this seems to make her sore.  We explained the mechanics of how the hip musculature works to keep the pelvis level.  Gait is still a bit slow and guarded but this is so that she can focus on correct gait pattern.   She will benefit from skilled PT to address findings and maximize participation in daily activities.   OBJECTIVE IMPAIRMENTS: Abnormal gait, decreased balance, decreased coordination, difficulty walking, decreased strength, increased edema, impaired flexibility, improper body mechanics, postural dysfunction, and pain.   ACTIVITY LIMITATIONS: lifting, bending, squatting, stairs, and locomotion level  PARTICIPATION LIMITATIONS: cleaning, laundry, shopping, community activity, and yard  work  PERSONAL FACTORS: Age are also affecting patient's functional outcome.   REHAB POTENTIAL: Excellent  CLINICAL DECISION MAKING: Evolving/moderate complexity  EVALUATION COMPLEXITY: Moderate   GOALS: Goals reviewed with patient? Yes  SHORT TERM GOALS: Target date: 08/09/23  Pt will be ind with initial HEP Baseline: Goal status: In Progress 07/16/23  2.  Pt will be educated on movements to avoid to follow post-op protocol to protect tendon transfer site Baseline:  Goal status: INITIAL  3.  Pt will demo improved Rt hip control in stance phase of gait with single walking stick as needed. Baseline:  Goal status: INITIAL    LONG TERM GOALS: Target date: 09/06/23  Pt will be ind with advanced HEP Baseline:  Goal status: INITIAL  2.  Pt will improve LEFS score to at least 55/80 to demo improved function Baseline: 44/80 Goal status: INITIAL  3.  Pt will perform 6 min walk test covering at least 700' with walking stick as needed Baseline:  Goal status: INITIAL  4.  Pt will be able to demo squat, lift 10lb, carry 10lb and walk to mimic household chores and yard activities with proper body mechanics without pain. Baseline:  Goal status: INITIAL  5.  Pt will improve glut strength to at least 4+/5 to improve gait mechanics, stairs and transfers. Baseline:  Goal status: INITIAL  6.  5x STS improved to 11 sec or less to demo improved functional strength and reduce fall risk. Baseline: 13.23 Goal status: INITIAL  PLAN:  PT FREQUENCY: 2x/week  PT DURATION: 8 weeks  PLANNED INTERVENTIONS: 97110-Therapeutic exercises, 97530- Therapeutic activity, [id number]- Neuromuscular re-education, 97535- Self Care, [id number]- Manual therapy, [phone]- Gait training, Patient/Family education, Balance training, Stair training, Cryotherapy, and Moist heat.  PLAN FOR NEXT SESSION: monitor incision for healing and start scar mobs R. Gait training with walking stick which can be weaned with proper  mechanics when Pt is ready, follow protocol scanned into media section of chart from Dr. Kenith Payer B. Lashica Hannay, PT 07/16/23 10:59 AM [facility] [address], [address] [place], [place] [address] Phone # [phone] Fax [phone]

## 2023-07-19 ENCOUNTER — Encounter: Payer: Self-pay | Admitting: Physical Therapy

## 2023-07-19 ENCOUNTER — Ambulatory Visit: Admitting: Physical Therapy

## 2023-07-19 DIAGNOSIS — M25551 Pain in right hip: Secondary | ICD-10-CM

## 2023-07-19 DIAGNOSIS — R293 Abnormal posture: Secondary | ICD-10-CM

## 2023-07-19 DIAGNOSIS — R252 Cramp and spasm: Secondary | ICD-10-CM

## 2023-07-19 DIAGNOSIS — M6281 Muscle weakness (generalized): Secondary | ICD-10-CM

## 2023-07-19 DIAGNOSIS — R262 Difficulty in walking, not elsewhere classified: Secondary | ICD-10-CM

## 2023-07-19 NOTE — Therapy (Signed)
 OUTPATIENT PHYSICAL THERAPY HIP EVALUATION - POST-OP   Patient Name: Rebecca Long MRN: [id number] DOB:[birth year], 73 y.o., female Today's Date: 07/19/2023  END OF SESSION:  PT End of Session - 07/19/23 1154     Visit Number 3    Date for PT Re-Evaluation 09/06/23    Authorization Type HTA no auth required    PT Start Time 1105    PT Stop Time 1146    PT Time Calculation (min) 41 min    Activity Tolerance Patient tolerated treatment well    Behavior During Therapy [facility] for tasks assessed/performed           Past Medical History:  Diagnosis Date   Anxiety    Cavus foot, acquired 09/09/2012    bilateral cavus feet the one on the left shows that she has some probable neurogenic weakness with abnormal curvature and in inversion contracture Podiatry 06/2015: H&P and x-ray reviewed with patient. Today I went ahead and I did a proximal nerve block I was able to aspirate the second MPJ and got out amount of clear fluid and injected with a quarter cc dexamethasone  Kenalog  and ap   DDD (degenerative disc disease), lumbar    Depression    Endometrial polyp    GERD (gastroesophageal reflux disease)    Hiatal hernia    Hip flexor tendinitis, right 07/02/2016   History of adenomatous polyp of colon    History of esophageal stricture    History of gastric polyp 11/2017   History of PCR DNA positive for HSV2 11/12/2016   History of primary hyperparathyroidism    s/p  left parathryoidectomy 09-17-2008-- resolved   Hyperlipidemia    Hypertension    Insomnia    Lumbar stenosis    Osteopenia    PONV (postoperative nausea and vomiting)    Schatzki's ring    Scoliosis    Tendinopathy of right biceps tendon 07/02/2016   Thickened endometrium    Uterine fibroid    UTI (urinary tract infection) due to Enterococcus 01/05/2020   Wears glasses    Past Surgical History:  Procedure Laterality Date   BREAST REDUCTION SURGERY Bilateral 2000   COLONOSCOPY     CYST EXCISION  1985   head and rt  axilla   DILATATION & CURETTAGE/HYSTEROSCOPY WITH MYOSURE N/A 08/13/2018   Procedure: DILATATION & CURETTAGE/HYSTEROSCOPY WITH MYOSURE;  Surgeon: Lavoie, Marie-Lyne, MD;  Location: [facility];  Service: Gynecology;  Laterality: N/A;  requests 10:15am in [place] Gyn block request 30 minutes   DILATION AND CURETTAGE OF UTERUS N/A 08/13/2018   ECTOPIC PREGNANCY SURGERY  1981   OPEN SURGICAL REPAIR OF GLUTEAL TENDON Right 07/02/2023   Procedure: REPAIR, TENDON, GLUTEUS MEDIUS, OPEN;  Surgeon: Wilhelmenia Harada, MD;  Location: [facility];  Service: Orthopedics;  Laterality: Right;  RIGHT GLUTEUS MAXIMUS TENDON TRANSFER WITH COLLAGEN PATCH AUGMENTATION   PARATHYROIDECTOMY Left 09/17/2008   dr Shea Denier  @[facility]    left superior  (adenoma)   REDUCTION MAMMAPLASTY Bilateral 2000   years ago   SHOULDER SURGERY Right 2007   frozen   UPPER GASTROINTESTINAL ENDOSCOPY  last one 12-18-2017   dr Brice Campi   Patient Active Problem List   Diagnosis Date Noted   Tendinopathy of gluteus medius 07/02/2023   Greater trochanteric bursitis of right hip 04/17/2022   C. difficile diarrhea 03/08/2022   Arthritis of right sacroiliac joint (HCC) 02/26/2022   Encounter for monitoring long-term proton pump inhibitor therapy 07/05/2021   Hx of colonic polyps  06/06/2021   Incontinence of feces with fecal urgency 06/06/2021   Sleep disorder 11/29/2020   Benign paroxysmal positional vertigo of right ear 08/30/2020   AC (acromioclavicular) arthritis 10/22/2019   Adhesive bursitis of left shoulder 07/22/2019   GERD with esophagitis 12/30/2017   Hyperlipidemia 05/15/2017   Morbid obesity (HCC) 11/12/2016   Essential hypertension, benign 10/06/2014   Depression with anxiety 10/06/2014   Stress incontinence 10/06/2014   Osteoporosis 10/06/2014   Degenerative disc disease, lumbar 09/09/2012   Spinal stenosis of lumbar region 09/09/2012    PCP: Napolean Backbone, DO  REFERRING PROVIDER:  Wilhelmenia Harada, MD  REFERRING DIAG: post-op REPAIR, TENDON, GLUTEUS MEDIUS, OPEN (Right) - RIGHT GLUTEUS MAXIMUS TENDON TRANSFER WITH COLLAGEN PATCH AUGMENTATION  Rationale for Evaluation and Treatment: Rehabilitation  THERAPY DIAG:  Muscle weakness (generalized)  Pain in right hip  Difficulty in walking, not elsewhere classified  Abnormal posture  Cramp and spasm  ONSET DATE: 07/02/23 surgery date - chronic pain and weakness prior to surgery  SUBJECTIVE:                                                                                                                                                                                           SUBJECTIVE STATEMENT: Pt is 2 weeks post op.  Yesterday she did the Bike for 20 mins (breaking it up in 10 minute increments) .She feels the same today.  PERTINENT HISTORY:    PAIN:  PAIN:  07/19/23 Are you having pain? Yes NPRS scale: 0-2/10 Pain location: Rt lateral hip Pain orientation: Right  PAIN TYPE: aching Pain description: intermittent, dull, and aching  Aggravating factors: laying on Rt side, getting in/out bed, in/out of car, walking Relieving factors: sitting   PRECAUTIONS: Other: protocol from Dr. Hermina Loosen scanned into media section of chart - avoid until 6 weeks post-op which is 08/13/23: passive ADDuction and ER, active ABDuction and IR  RED FLAGS: None   WEIGHT BEARING RESTRICTIONS: PWB initially, doctor just cleared to get rid of walking stick when ready  FALLS:  Has patient fallen in last 6 months? Yes. Number of falls 1, while carrying a planter and going up stairs  LIVING ENVIRONMENT: Lives with: lives with their spouse Lives in: House/apartment Stairs: Yes: External: 3 steps; on left going up Has following equipment at home: Single point cane  OCCUPATION: retired  PLOF: Independent  PATIENT GOALS: walk without pain and walking stick, can I get back to pickleball?  NEXT MD VISIT:  OBJECTIVE:  Note:  Objective measures were completed at Evaluation unless otherwise noted.  DIAGNOSTIC FINDINGS:  Xray (3 views right hip): No femoral acetabular osteoarthritis  MRI (right hip): There is an end-stage full-thickness tear of the gluteus medius with significant atrophy on T1 coronal view  PATIENT SURVEYS:  LEFS: 44/80     COGNITION: Overall cognitive status: Within functional limits for tasks assessed     SENSATION: WFL  MUSCLE LENGTH:   POSTURE: flexed trunk   PALPATION: Mild tenderness around incision - steri strips placed today after stitches were removed  LUMBAR ROM:  WFL  LOWER EXTREMITY ROM:    Pt able to perform seated march A/ROM and quadruped rocking for hip flexion beyond 90 deg without pain  LOWER EXTREMITY MMT:   Lt LE 4+/5 Rt hip at least 3+/5 - isometric testing only, resisted isometrics strong and painfree   FUNCTIONAL TESTS:  5 times sit to stand: 13.23 Timed up and go (TUG): 12.24 using walking stick 2 MWT: 315 feet using single walking stick in Lt UE  GAIT: Distance walked: 315' in 2 min  Assistive device utilized: single walking stick Level of assistance: Modified independence Comments: Rt early stance phase Trendelenburg  TREATMENT DATE:  07/19/23: Recumbent Bike (seated slightly reclined) 8 mins- PT presents to discuss status SAQ #2 lb ankle weight) 2 x 10 PPT x 20 Hooklying Hip Adduction Isometric with Ball x 20 Quadruped Rocking Backward 2 x 10 Seated Isometric Hip Abduction 2 x 10 holding 5 sec Standing Knee Flexion AROM with Chair Support 2 x 10 holding 5 sec Patient education & PT performed scar tissue massage     07/16/23: Reviewed HEP SAQ (added 2 lb ankle weight) 2 x 10 PPT x 20 Supine Hip Adduction Isometric with Ball x 20 Quadruped Rocking Backward 2 x 10 Seated Isometric Hip Abduction 2 x 10 holding 5 sec Standing Knee Flexion AROM with Chair Support 2 x 10 holding 5 sec Reviewed motions to avoid per protocol from  MD Gait training: Lt UE for walking stick paired with Rt foot, work on engaging glutes early in stance phase to control lateral hip x 150 feet Ice to right hip post treatment x 10 min  07/12/23: Initiated HEP Reviewed motions to avoid per protocol from MD Gait training: Lt UE for walking stick paired with Rt foot, work on engaging glutes early in stance phase to control lateral hip                                                                                                                                  PATIENT EDUCATION:  Education details: [email or web address] Person educated: Patient Education method: Explanation, Demonstration, Verbal cues, and Handouts Education comprehension: verbalized understanding and returned demonstration  HOME EXERCISE PROGRAM: Access Code: [email or web address] URL: [email or web address] Date: 07/12/2023 Prepared by: Minor Amble Beuhring  Exercises - Supine Posterior Pelvic Tilt  - 2 x daily - 7 x weekly - 2 sets - 10 reps - Supine Short Arc Quad  - 2 x daily - 7 x weekly - 2 sets - 10 reps - Supine  Hip Adduction Isometric with Ball  - 2 x daily - 7 x weekly - 2 sets - 10 reps - 5 hold - Quadruped Rocking Backward  - 2 x daily - 7 x weekly - 1-2 sets - 10 reps - 5 hold - Seated Isometric Hip Abduction  - 2 x daily - 7 x weekly - 2 sets - 10 reps - 5 hold - Standing Knee Flexion AROM with Chair Support  - 2 x daily - 7 x weekly - 2 sets - 10 reps  ASSESSMENT:  CLINICAL IMPRESSION: Latriece presents to therapy with minimal pain. She went to the gym yesterday and rode the recumbent bike for 20 mins in 10 minute increments. Overall, she tolerated treatment session well. Performed, educated and demonstrated scar tissue massage for patient. PT monitored patient throughout to make sure she abided by motion restrictions. Patient will benefit from skilled PT to address the below impairments and improve overall function.   OBJECTIVE IMPAIRMENTS: Abnormal gait, decreased  balance, decreased coordination, difficulty walking, decreased strength, increased edema, impaired flexibility, improper body mechanics, postural dysfunction, and pain.   ACTIVITY LIMITATIONS: lifting, bending, squatting, stairs, and locomotion level  PARTICIPATION LIMITATIONS: cleaning, laundry, shopping, community activity, and yard work  PERSONAL FACTORS: Age are also affecting patient's functional outcome.   REHAB POTENTIAL: Excellent  CLINICAL DECISION MAKING: Evolving/moderate complexity  EVALUATION COMPLEXITY: Moderate   GOALS: Goals reviewed with patient? Yes  SHORT TERM GOALS: Target date: 08/09/23  Pt will be ind with initial HEP Baseline: Goal status: In Progress 07/16/23  2.  Pt will be educated on movements to avoid to follow post-op protocol to protect tendon transfer site Baseline:  Goal status: INITIAL  3.  Pt will demo improved Rt hip control in stance phase of gait with single walking stick as needed. Baseline:  Goal status: INITIAL    LONG TERM GOALS: Target date: 09/06/23  Pt will be ind with advanced HEP Baseline:  Goal status: INITIAL  2.  Pt will improve LEFS score to at least 55/80 to demo improved function Baseline: 44/80 Goal status: INITIAL  3.  Pt will perform 6 min walk test covering at least 700' with walking stick as needed Baseline:  Goal status: INITIAL  4.  Pt will be able to demo squat, lift 10lb, carry 10lb and walk to mimic household chores and yard activities with proper body mechanics without pain. Baseline:  Goal status: INITIAL  5.  Pt will improve glut strength to at least 4+/5 to improve gait mechanics, stairs and transfers. Baseline:  Goal status: INITIAL  6.  5x STS improved to 11 sec or less to demo improved functional strength and reduce fall risk. Baseline: 13.23 Goal status: INITIAL  PLAN:  PT FREQUENCY: 2x/week  PT DURATION: 8 weeks  PLANNED INTERVENTIONS: 97110-Therapeutic exercises, 97530- Therapeutic  activity, [id number]- Neuromuscular re-education, 97535- Self Care, [id number]- Manual therapy, [phone]- Gait training, Patient/Family education, Balance training, Stair training, Cryotherapy, and Moist heat.  PLAN FOR NEXT SESSION:  Gait training with walking stick which can be weaned with proper mechanics when Pt is ready, follow protocol scanned into media section of chart from Dr. Bertina Broccoli, PT 07/19/23 11:54 AM [facility] [address], [address] [place], [place] [address] Phone # [phone] Fax [phone]

## 2023-07-22 ENCOUNTER — Ambulatory Visit: Payer: Self-pay

## 2023-07-22 DIAGNOSIS — M25551 Pain in right hip: Secondary | ICD-10-CM | POA: Diagnosis not present

## 2023-07-22 DIAGNOSIS — M6281 Muscle weakness (generalized): Secondary | ICD-10-CM

## 2023-07-22 DIAGNOSIS — R262 Difficulty in walking, not elsewhere classified: Secondary | ICD-10-CM

## 2023-07-22 DIAGNOSIS — R252 Cramp and spasm: Secondary | ICD-10-CM

## 2023-07-22 DIAGNOSIS — R2689 Other abnormalities of gait and mobility: Secondary | ICD-10-CM

## 2023-07-22 NOTE — Therapy (Signed)
 OUTPATIENT PHYSICAL THERAPY HIP EVALUATION - POST-OP   Patient Name: Rebecca Long MRN: [id number] DOB:11-08-1950, 73 y.o., female Today's Date: 07/22/2023  END OF SESSION:  PT End of Session - 07/22/23 1528     Visit Number 4    Date for PT Re-Evaluation 09/06/23    Authorization Type HTA no auth required    PT Start Time 1449    PT Stop Time 1527    PT Time Calculation (min) 38 min    Activity Tolerance Patient tolerated treatment well    Behavior During Therapy [facility] for tasks assessed/performed            Past Medical History:  Diagnosis Date   Anxiety    Cavus foot, acquired 09/09/2012    bilateral cavus feet the one on the left shows that she has some probable neurogenic weakness with abnormal curvature and in inversion contracture Podiatry 06/2015: H&P and x-ray reviewed with patient. Today I went ahead and I did a proximal nerve block I was able to aspirate the second MPJ and got out amount of clear fluid and injected with a quarter cc dexamethasone  Kenalog  and ap   DDD (degenerative disc disease), lumbar    Depression    Endometrial polyp    GERD (gastroesophageal reflux disease)    Hiatal hernia    Hip flexor tendinitis, right 07/02/2016   History of adenomatous polyp of colon    History of esophageal stricture    History of gastric polyp 11/2017   History of PCR DNA positive for HSV2 11/12/2016   History of primary hyperparathyroidism    s/p  left parathryoidectomy 09-17-2008-- resolved   Hyperlipidemia    Hypertension    Insomnia    Lumbar stenosis    Osteopenia    PONV (postoperative nausea and vomiting)    Schatzki's ring    Scoliosis    Tendinopathy of right biceps tendon 07/02/2016   Thickened endometrium    Uterine fibroid    UTI (urinary tract infection) due to Enterococcus 01/05/2020   Wears glasses    Past Surgical History:  Procedure Laterality Date   BREAST REDUCTION SURGERY Bilateral 2000   COLONOSCOPY     CYST EXCISION  1985   head and  rt axilla   DILATATION & CURETTAGE/HYSTEROSCOPY WITH MYOSURE N/A 08/13/2018   Procedure: DILATATION & CURETTAGE/HYSTEROSCOPY WITH MYOSURE;  Surgeon: Lavoie, Marie-Lyne, MD;  Location: [facility];  Service: Gynecology;  Laterality: N/A;  requests 10:15am in [place] Gyn block request 30 minutes   DILATION AND CURETTAGE OF UTERUS N/A 08/13/2018   ECTOPIC PREGNANCY SURGERY  1981   OPEN SURGICAL REPAIR OF GLUTEAL TENDON Right 07/02/2023   Procedure: REPAIR, TENDON, GLUTEUS MEDIUS, OPEN;  Surgeon: Genelle Standing, MD;  Location: [facility];  Service: Orthopedics;  Laterality: Right;  RIGHT GLUTEUS MAXIMUS TENDON TRANSFER WITH COLLAGEN PATCH AUGMENTATION   PARATHYROIDECTOMY Left 09/17/2008   dr jeoffrey dawn  @[facility]    left superior  (adenoma)   REDUCTION MAMMAPLASTY Bilateral 2000   years ago   SHOULDER SURGERY Right 2007   frozen   UPPER GASTROINTESTINAL ENDOSCOPY  last one 12-18-2017   dr wilhelmenia   Patient Active Problem List   Diagnosis Date Noted   Tendinopathy of gluteus medius 07/02/2023   Greater trochanteric bursitis of right hip 04/17/2022   C. difficile diarrhea 03/08/2022   Arthritis of right sacroiliac joint (HCC) 02/26/2022   Encounter for monitoring long-term proton pump inhibitor therapy 07/05/2021   Hx of colonic  polyps 06/06/2021   Incontinence of feces with fecal urgency 06/06/2021   Sleep disorder 11/29/2020   Benign paroxysmal positional vertigo of right ear 08/30/2020   AC (acromioclavicular) arthritis 10/22/2019   Adhesive bursitis of left shoulder 07/22/2019   GERD with esophagitis 12/30/2017   Hyperlipidemia 05/15/2017   Morbid obesity (HCC) 11/12/2016   Essential hypertension, benign 10/06/2014   Depression with anxiety 10/06/2014   Stress incontinence 10/06/2014   Osteoporosis 10/06/2014   Degenerative disc disease, lumbar 09/09/2012   Spinal stenosis of lumbar region 09/09/2012    PCP: Charlies Bellini, DO  REFERRING PROVIDER:  Elspeth Parker, MD  REFERRING DIAG: post-op REPAIR, TENDON, GLUTEUS MEDIUS, OPEN (Right) - RIGHT GLUTEUS MAXIMUS TENDON TRANSFER WITH COLLAGEN PATCH AUGMENTATION  Rationale for Evaluation and Treatment: Rehabilitation  THERAPY DIAG:  Muscle weakness (generalized)  Pain in right hip  Difficulty in walking, not elsewhere classified  Cramp and spasm  Other abnormalities of gait and mobility  ONSET DATE: 07/02/23 surgery date - chronic pain and weakness prior to surgery  SUBJECTIVE:                                                                                                                                                                                           SUBJECTIVE STATEMENT: No pain today.  I've been doing my exercises.  I haven't been on the bike today.   PERTINENT HISTORY:    PAIN:  PAIN:  07/22/23 Are you having pain? Yes NPRS scale: 0/10 Pain location: Rt lateral hip Pain orientation: Right  PAIN TYPE: aching Pain description: intermittent, dull, and aching  Aggravating factors: laying on Rt side, getting in/out bed, in/out of car, walking Relieving factors: sitting   PRECAUTIONS: Other: protocol from Dr. Parker scanned into media section of chart - avoid until 6 weeks post-op which is 08/13/23: passive ADDuction and ER, active ABDuction and IR  RED FLAGS: None   WEIGHT BEARING RESTRICTIONS: PWB initially, doctor just cleared to get rid of walking stick when ready  FALLS:  Has patient fallen in last 6 months? Yes. Number of falls 1, while carrying a planter and going up stairs  LIVING ENVIRONMENT: Lives with: lives with their spouse Lives in: House/apartment Stairs: Yes: External: 3 steps; on left going up Has following equipment at home: Single point cane  OCCUPATION: retired  PLOF: Independent  PATIENT GOALS: walk without pain and walking stick, can I get back to pickleball?  NEXT MD VISIT:  OBJECTIVE:  Note: Objective measures were  completed at Evaluation unless otherwise noted.  DIAGNOSTIC FINDINGS:  Xray (3 views right hip): No femoral acetabular osteoarthritis   MRI (right  hip): There is an end-stage full-thickness tear of the gluteus medius with significant atrophy on T1 coronal view  PATIENT SURVEYS:  LEFS: 44/80  COGNITION: Overall cognitive status: Within functional limits for tasks assessed     SENSATION: WFL  MUSCLE LENGTH:   POSTURE: flexed trunk   PALPATION: Mild tenderness around incision - steri strips placed today after stitches were removed  LUMBAR ROM:  WFL  LOWER EXTREMITY ROM:    Pt able to perform seated march A/ROM and quadruped rocking for hip flexion beyond 90 deg without pain  LOWER EXTREMITY MMT:   Lt LE 4+/5 Rt hip at least 3+/5 - isometric testing only, resisted isometrics strong and painfree   FUNCTIONAL TESTS:  5 times sit to stand: 13.23 Timed up and go (TUG): 12.24 using walking stick 2 MWT: 315 feet using single walking stick in Lt UE  GAIT: Distance walked: 315' in 2 min  Assistive device utilized: single walking stick Level of assistance: Modified independence Comments: Rt early stance phase Trendelenburg  TREATMENT DATE:   07/22/23: Recumbent Bike (seated slightly reclined) 8 mins- PT presents to discuss status SAQ (2.5# lb ankle weight) 2 x 10 PPT x 20 with TA activation Hooklying Hip Adduction Isometric with Ball x 20 Prone hip extension isometrics with resistance from PT 5 2x10  Seated Isometric Hip Abduction and ER  2 x 10 holding 5 sec Prone knee flexion on Rt 2x10 Gait around clinic with walking stick- fatigue at the end- full loop around building  07/19/23: Recumbent Bike (seated slightly reclined) 8 mins- PT presents to discuss status SAQ #2 lb ankle weight) 2 x 10 PPT x 20 Hooklying Hip Adduction Isometric with Ball x 20 Quadruped Rocking Backward 2 x 10 Seated Isometric Hip Abduction 2 x 10 holding 5 sec Standing Knee Flexion AROM with  Chair Support 2 x 10 holding 5 sec Patient education & PT performed scar tissue massage   07/16/23: Reviewed HEP SAQ (added 2 lb ankle weight) 2 x 10 PPT x 20 Supine Hip Adduction Isometric with Ball x 20 Quadruped Rocking Backward 2 x 10 Seated Isometric Hip Abduction 2 x 10 holding 5 sec Standing Knee Flexion AROM with Chair Support 2 x 10 holding 5 sec Reviewed motions to avoid per protocol from MD Gait training: Lt UE for walking stick paired with Rt foot, work on engaging glutes early in stance phase to control lateral hip x 150 feet Ice to right hip post treatment x 10 min                                                                                                                                PATIENT EDUCATION:  Education details: [email or web address] Person educated: Patient Education method: Explanation, Demonstration, Verbal cues, and Handouts Education comprehension: verbalized understanding and returned demonstration  HOME EXERCISE PROGRAM: Access Code: [email or web address] URL: [email or web address] Date: 07/12/2023 Prepared by: Orvil Beuhring  Exercises - Supine Posterior Pelvic Tilt  -  2 x daily - 7 x weekly - 2 sets - 10 reps - Supine Short Arc Quad  - 2 x daily - 7 x weekly - 2 sets - 10 reps - Supine Hip Adduction Isometric with Ball  - 2 x daily - 7 x weekly - 2 sets - 10 reps - 5 hold - Quadruped Rocking Backward  - 2 x daily - 7 x weekly - 1-2 sets - 10 reps - 5 hold - Seated Isometric Hip Abduction  - 2 x daily - 7 x weekly - 2 sets - 10 reps - 5 hold - Standing Knee Flexion AROM with Chair Support  - 2 x daily - 7 x weekly - 2 sets - 10 reps  ASSESSMENT:  CLINICAL IMPRESSION: Reverie presents to therapy with minimal pain. Pt is doing well post-op and is performing post-op exercises as allowed by protocol. Pt is working on endurance activities with walking longer distances.  She fatigues with exercises.  PT monitored patient throughout to make sure she abided by  motion restrictions and monitored for pain. Patient will benefit from skilled PT to address the below impairments and improve overall function.   OBJECTIVE IMPAIRMENTS: Abnormal gait, decreased balance, decreased coordination, difficulty walking, decreased strength, increased edema, impaired flexibility, improper body mechanics, postural dysfunction, and pain.   ACTIVITY LIMITATIONS: lifting, bending, squatting, stairs, and locomotion level  PARTICIPATION LIMITATIONS: cleaning, laundry, shopping, community activity, and yard work  PERSONAL FACTORS: Age are also affecting patient's functional outcome.   REHAB POTENTIAL: Excellent  CLINICAL DECISION MAKING: Evolving/moderate complexity  EVALUATION COMPLEXITY: Moderate   GOALS: Goals reviewed with patient? Yes  SHORT TERM GOALS: Target date: 08/09/23  Pt will be ind with initial HEP Baseline: Goal status: In Progress 07/16/23  2.  Pt will be educated on movements to avoid to follow post-op protocol to protect tendon transfer site Baseline:  Goal status: INITIAL  3.  Pt will demo improved Rt hip control in stance phase of gait with single walking stick as needed. Baseline:  Goal status: INITIAL    LONG TERM GOALS: Target date: 09/06/23  Pt will be ind with advanced HEP Baseline:  Goal status: INITIAL  2.  Pt will improve LEFS score to at least 55/80 to demo improved function Baseline: 44/80 Goal status: INITIAL  3.  Pt will perform 6 min walk test covering at least 700' with walking stick as needed Baseline:  Goal status: INITIAL  4.  Pt will be able to demo squat, lift 10lb, carry 10lb and walk to mimic household chores and yard activities with proper body mechanics without pain. Baseline:  Goal status: INITIAL  5.  Pt will improve glut strength to at least 4+/5 to improve gait mechanics, stairs and transfers. Baseline:  Goal status: INITIAL  6.  5x STS improved to 11 sec or less to demo improved functional  strength and reduce fall risk. Baseline: 13.23 Goal status: INITIAL  PLAN:  PT FREQUENCY: 2x/week  PT DURATION: 8 weeks  PLANNED INTERVENTIONS: 97110-Therapeutic exercises, 97530- Therapeutic activity, [id number]- Neuromuscular re-education, 97535- Self Care, [id number]- Manual therapy, [phone]- Gait training, Patient/Family education, Balance training, Stair training, Cryotherapy, and Moist heat.  PLAN FOR NEXT SESSION:  Continue to work on gait endurance, follow protocol, moves to week 4 on 07/30/23. follow protocol scanned into media section of chart from Dr. Genelle Burnard Joy, PT 07/22/23 3:29 PM   [facility] [address], [address] [place], [place] [address] Phone # [phone]  Fax [phone]

## 2023-07-23 IMAGING — MR MR HIP*R* W/O CM
4 of 5 series · 25 of 40 positions shown · non-contrast
Comparison: Radiographs dated November 04, 2020

CLINICAL DATA: Chronic hip pain

EXAM:
MR OF THE RIGHT HIP WITHOUT CONTRAST
TECHNIQUE: Multiplanar, multisequence MR imaging was performed. No intravenous
contrast was administered.

[Series 3: T1 · coronal · 4.0mm · 0.85mm/px · 8 of 32 slices shown]
[im 1/32]
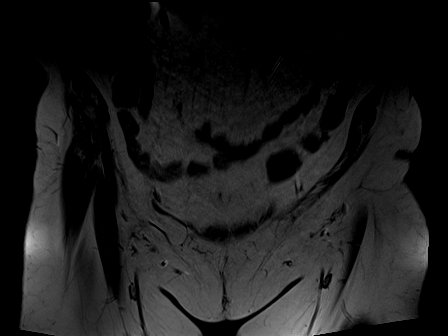
[im 4/32]
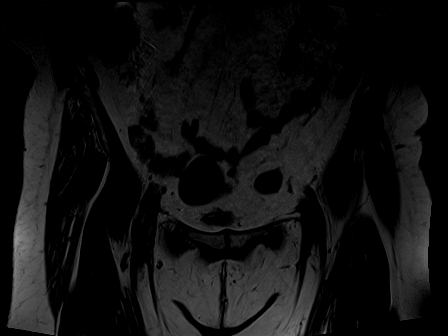
[im 8/32]
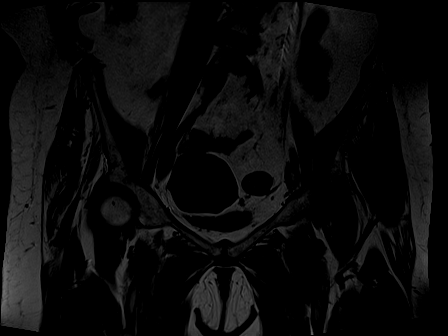
[im 12/32]
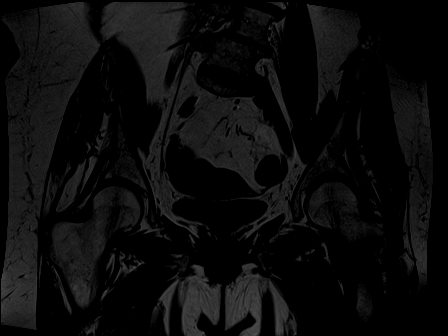
[im 16/32]
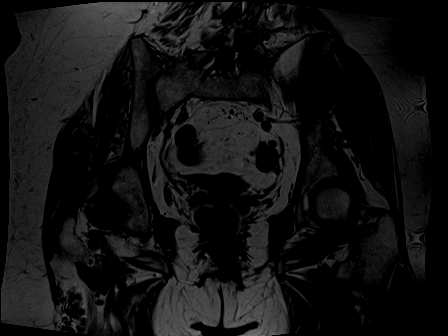
[im 20/32]
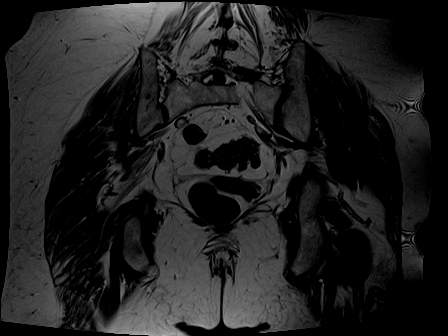
[im 24/32]
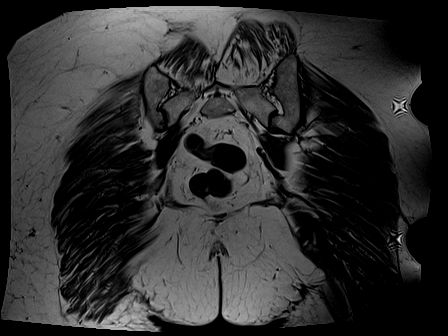
[im 28/32]
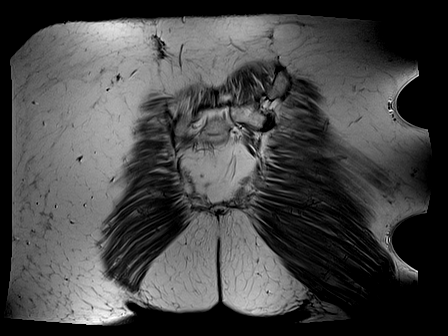

[Series 4: STIR · coronal · 4.0mm · 1.19mm/px · 3 of 25 slices shown]
[im 4/25]
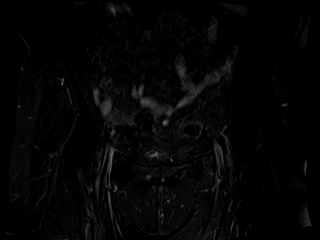
[im 14/25]
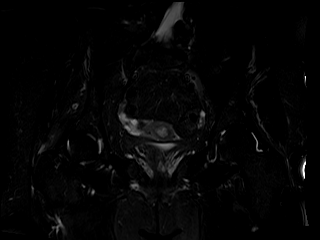
[im 21/25]
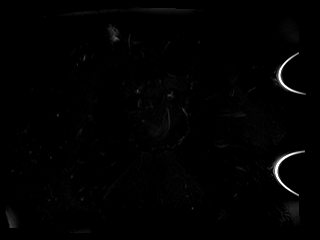

[Series 6: PD fat-sat · sagittal · 4.5mm · 0.35mm/px · 7 of 23 slices shown (1 of 2)]
[im 1/23]
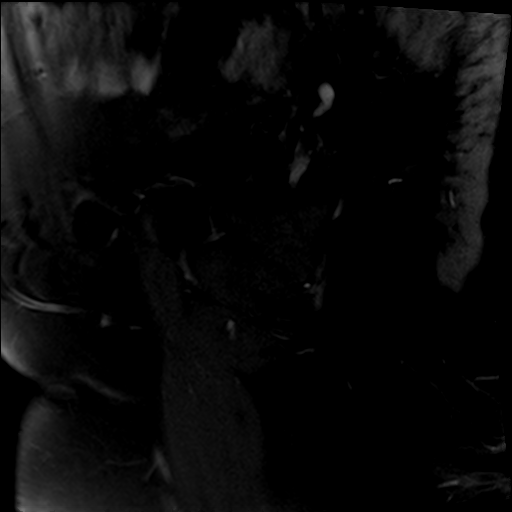
[im 4/23]
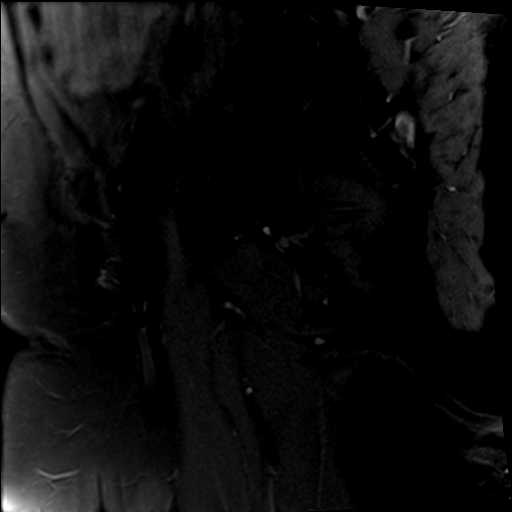
[im 8/23]
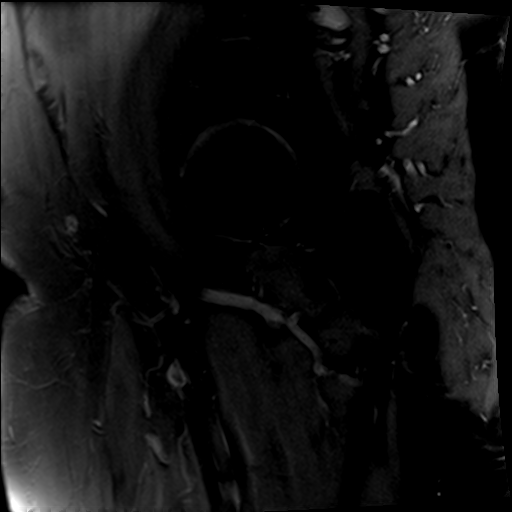
[im 12/23]
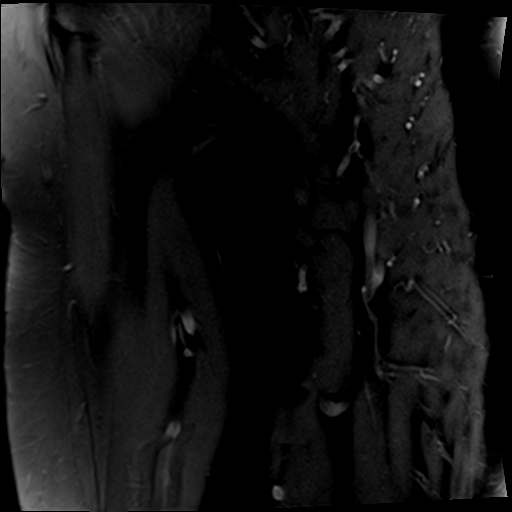
[im 15/23]
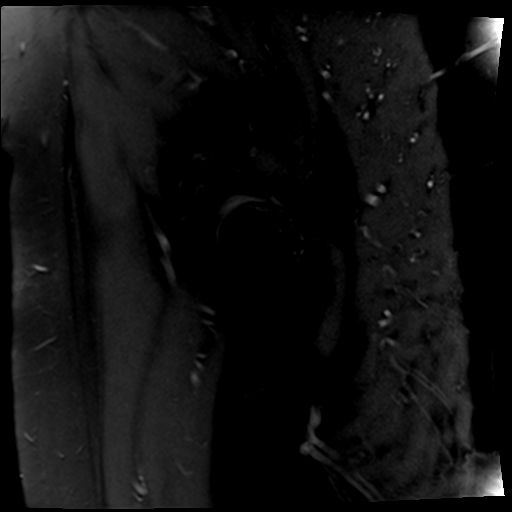
[im 19/23]
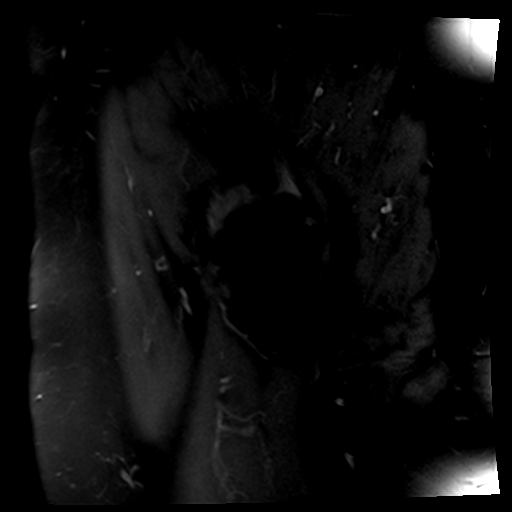
[im 23/23]
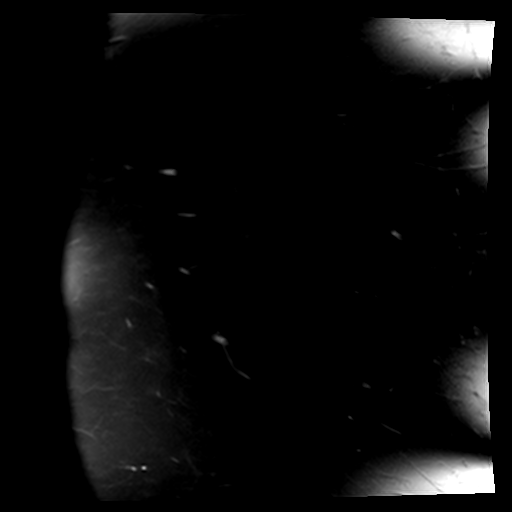

[Series 7: PD fat-sat · coronal · 4.5mm · 0.35mm/px · 7 of 23 slices shown (2 of 2)]
[im 1/23]
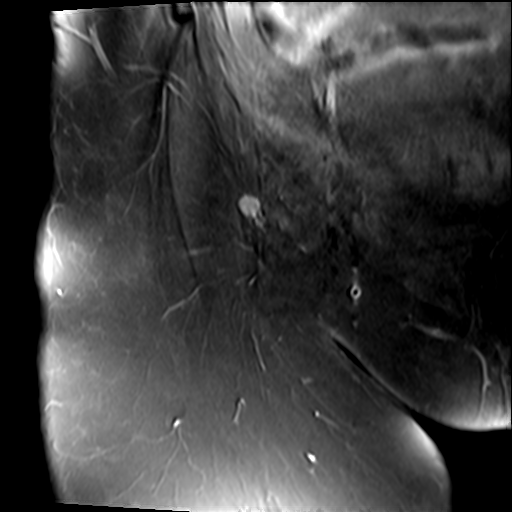
[im 4/23]
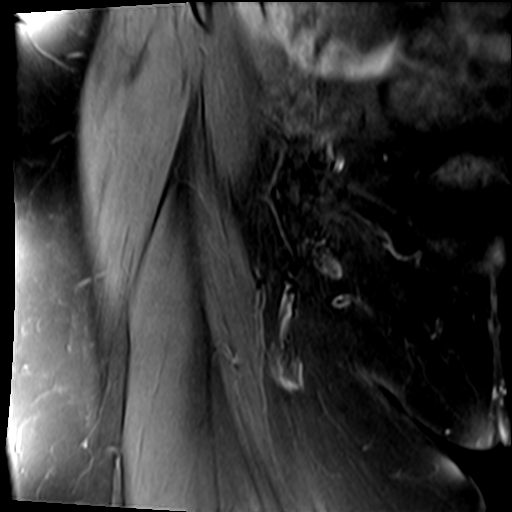
[im 8/23]
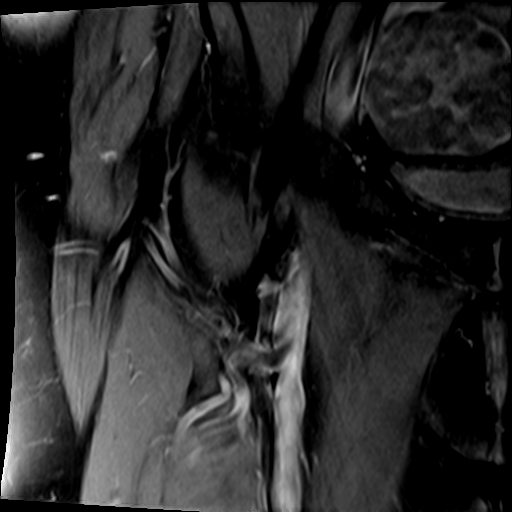
[im 12/23]
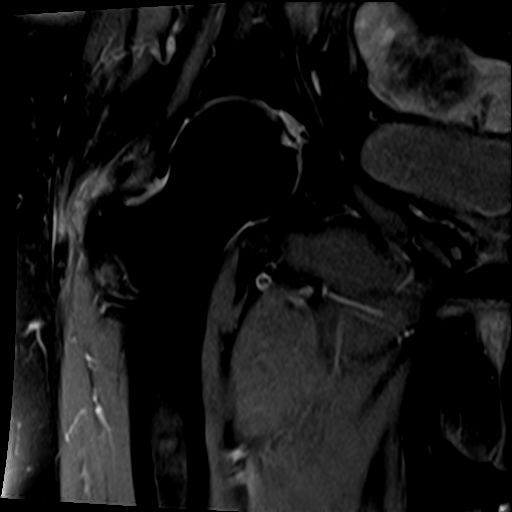
[im 15/23]
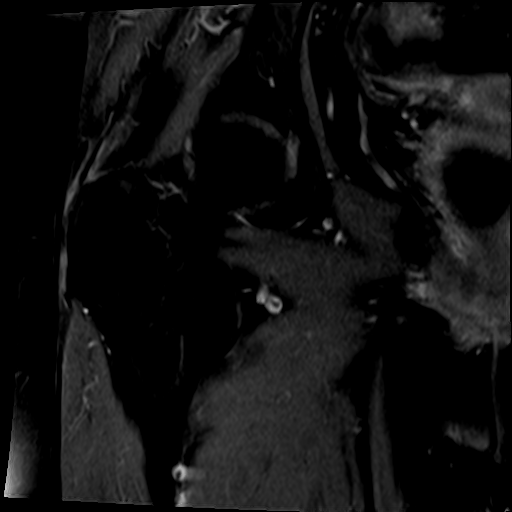
[im 19/23]
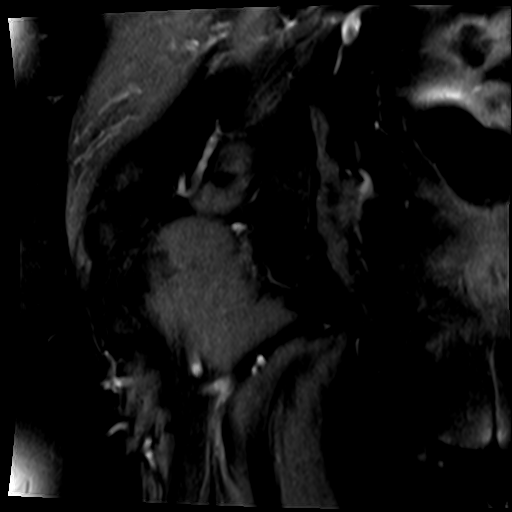
[im 23/23]
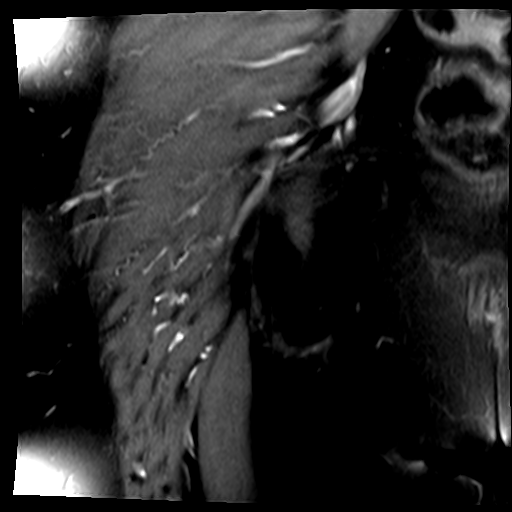

[25 of 40 positions shown; findings below may reference images not displayed]

FINDINGS: Bone

No hip fracture, dislocation or avascular necrosis. No aggressive
osseous lesion.

SI joints are normal. No SI joint widening or erosive changes.

Lower lumbar spine demonstrates no focal abnormality.

Alignment

Normal. No subluxation.

Dysplasia

None.

Joint effusion

No joint effusions.

Labrum

Chronic degenerative changes of the labrum without evidence of acute
tear.

Cartilage

Generalized articular cartilage thinning without evidence of
significant subchondral cystic changes or edema.

Capsule and ligaments

Normal.

Muscles and Tendons

Flexors: Normal.

Extensors: Normal.

Abductors: Tendinopathy and edema at the insertion of the gluteus
minimus.

Adductors: Normal.

Rotators: Normal.

Hamstrings: Normal.

Other Findings

No bursal fluid.

Viscera

Large right uterine wall fibroid measuring at least 5 x 5 cm.
Urinary bladder is unremarkable.
IMPRESSION: 1.  No evidence of fracture or subluxation.

2.  Mild hip osteoarthritis without evidence of joint effusion.

3.  Mild gluteus minimus tendinopathy.

4.  Large uterine fibroid measuring at least 5 x 5 cm.

## 2023-07-25 ENCOUNTER — Ambulatory Visit

## 2023-07-25 DIAGNOSIS — R262 Difficulty in walking, not elsewhere classified: Secondary | ICD-10-CM

## 2023-07-25 DIAGNOSIS — R252 Cramp and spasm: Secondary | ICD-10-CM

## 2023-07-25 DIAGNOSIS — M25551 Pain in right hip: Secondary | ICD-10-CM

## 2023-07-25 DIAGNOSIS — M6281 Muscle weakness (generalized): Secondary | ICD-10-CM

## 2023-07-25 NOTE — Therapy (Signed)
 OUTPATIENT PHYSICAL THERAPY HIP EVALUATION - POST-OP   Patient Name: Rebecca Long MRN: [id number] DOB:December 01, 1950, 73 y.o., female Today's Date: 07/25/2023  END OF SESSION:  PT End of Session - 07/25/23 1014     Visit Number 5    Date for PT Re-Evaluation 09/06/23    Authorization Type HTA no auth required    Progress Note Due on Visit 10    PT Start Time 0932    PT Stop Time 1014    PT Time Calculation (min) 42 min    Activity Tolerance Patient tolerated treatment well    Behavior During Therapy WFL for tasks assessed/performed             Past Medical History:  Diagnosis Date   Anxiety    Cavus foot, acquired 09/09/2012    bilateral cavus feet the one on the left shows that she has some probable neurogenic weakness with abnormal curvature and in inversion contracture Podiatry 06/2015: H&P and x-ray reviewed with patient. Today I went ahead and I did a proximal nerve block I was able to aspirate the second MPJ and got out amount of clear fluid and injected with a quarter cc dexamethasone  Kenalog  and ap   DDD (degenerative disc disease), lumbar    Depression    Endometrial polyp    GERD (gastroesophageal reflux disease)    Hiatal hernia    Hip flexor tendinitis, right 07/02/2016   History of adenomatous polyp of colon    History of esophageal stricture    History of gastric polyp 11/2017   History of PCR DNA positive for HSV2 11/12/2016   History of primary hyperparathyroidism    s/p  left parathryoidectomy 09-17-2008-- resolved   Hyperlipidemia    Hypertension    Insomnia    Lumbar stenosis    Osteopenia    PONV (postoperative nausea and vomiting)    Schatzki's ring    Scoliosis    Tendinopathy of right biceps tendon 07/02/2016   Thickened endometrium    Uterine fibroid    UTI (urinary tract infection) due to Enterococcus 01/05/2020   Wears glasses    Past Surgical History:  Procedure Laterality Date   BREAST REDUCTION SURGERY Bilateral 2000   COLONOSCOPY      CYST EXCISION  1985   head and rt axilla   DILATATION & CURETTAGE/HYSTEROSCOPY WITH MYOSURE N/A 08/13/2018   Procedure: DILATATION & CURETTAGE/HYSTEROSCOPY WITH MYOSURE;  Surgeon: Lavoie, Marie-Lyne, MD;  Location: [facility];  Service: Gynecology;  Laterality: N/A;  requests 10:15am in [place] Gyn block request 30 minutes   DILATION AND CURETTAGE OF UTERUS N/A 08/13/2018   ECTOPIC PREGNANCY SURGERY  1981   OPEN SURGICAL REPAIR OF GLUTEAL TENDON Right 07/02/2023   Procedure: REPAIR, TENDON, GLUTEUS MEDIUS, OPEN;  Surgeon: Genelle Standing, MD;  Location: [facility];  Service: Orthopedics;  Laterality: Right;  RIGHT GLUTEUS MAXIMUS TENDON TRANSFER WITH COLLAGEN PATCH AUGMENTATION   PARATHYROIDECTOMY Left 09/17/2008   dr jeoffrey dawn  @[facility]    left superior  (adenoma)   REDUCTION MAMMAPLASTY Bilateral 2000   years ago   SHOULDER SURGERY Right 2007   frozen   UPPER GASTROINTESTINAL ENDOSCOPY  last one 12-18-2017   dr wil[place]   Patient Active Problem List   Diagnosis Date Noted   Tendinopathy of gluteus medius 07/02/2023   Greater trochanteric bursitis of right hip 04/17/2022   C. difficile diarrhea 03/08/2022   Arthritis of right sacroiliac joint (HCC) 02/26/2022   Encounter for monitoring long-term  proton pump inhibitor therapy 07/05/2021   Hx of colonic polyps 06/06/2021   Incontinence of feces with fecal urgency 06/06/2021   Sleep disorder 11/29/2020   Benign paroxysmal positional vertigo of right ear 08/30/2020   AC (acromioclavicular) arthritis 10/22/2019   Adhesive bursitis of left shoulder 07/22/2019   GERD with esophagitis 12/30/2017   Hyperlipidemia 05/15/2017   Morbid obesity (HCC) 11/12/2016   Essential hypertension, benign 10/06/2014   Depression with anxiety 10/06/2014   Stress incontinence 10/06/2014   Osteoporosis 10/06/2014   Degenerative disc disease, lumbar 09/09/2012   Spinal stenosis of lumbar region 09/09/2012    PCP: Charlies Bellini, DO  REFERRING PROVIDER: Elspeth Parker, MD  REFERRING DIAG: post-op REPAIR, TENDON, GLUTEUS MEDIUS, OPEN (Right) - RIGHT GLUTEUS MAXIMUS TENDON TRANSFER WITH COLLAGEN PATCH AUGMENTATION  Rationale for Evaluation and Treatment: Rehabilitation  THERAPY DIAG:  Muscle weakness (generalized)  Pain in right hip  Difficulty in walking, not elsewhere classified  Cramp and spasm  ONSET DATE: 07/02/23 surgery date - chronic pain and weakness prior to surgery  SUBJECTIVE:                                                                                                                                                                                           SUBJECTIVE STATEMENT: I'm fatigued today.    PERTINENT HISTORY:    PAIN:  PAIN:  07/22/23 Are you having pain? Yes NPRS scale: 0/10 Pain location: Rt lateral hip Pain orientation: Right  PAIN TYPE: aching Pain description: intermittent, dull, and aching  Aggravating factors: laying on Rt side, getting in/out bed, in/out of car, walking Relieving factors: sitting   PRECAUTIONS: Other: protocol from Dr. Parker scanned into media section of chart - avoid until 6 weeks post-op which is 08/13/23: passive ADDuction and ER, active ABDuction and IR  RED FLAGS: None   WEIGHT BEARING RESTRICTIONS: PWB initially, doctor just cleared to get rid of walking stick when ready  FALLS:  Has patient fallen in last 6 months? Yes. Number of falls 1, while carrying a planter and going up stairs  LIVING ENVIRONMENT: Lives with: lives with their spouse Lives in: House/apartment Stairs: Yes: External: 3 steps; on left going up Has following equipment at home: Single point cane  OCCUPATION: retired  PLOF: Independent  PATIENT GOALS: walk without pain and walking stick, can I get back to pickleball?  NEXT MD VISIT:  OBJECTIVE:  Note: Objective measures were completed at Evaluation unless otherwise noted.  DIAGNOSTIC FINDINGS:  Xray  (3 views right hip): No femoral acetabular osteoarthritis   MRI (right hip): There is an end-stage full-thickness tear of the gluteus  medius with significant atrophy on T1 coronal view  PATIENT SURVEYS:  LEFS: 44/80  COGNITION: Overall cognitive status: Within functional limits for tasks assessed     SENSATION: WFL  MUSCLE LENGTH:   POSTURE: flexed trunk   PALPATION: Mild tenderness around incision - steri strips placed today after stitches were removed  LUMBAR ROM:  WFL  LOWER EXTREMITY ROM:    Pt able to perform seated march A/ROM and quadruped rocking for hip flexion beyond 90 deg without pain  LOWER EXTREMITY MMT:   Lt LE 4+/5 Rt hip at least 3+/5 - isometric testing only, resisted isometrics strong and painfree   FUNCTIONAL TESTS:  5 times sit to stand: 13.23 Timed up and go (TUG): 12.24 using walking stick 2 MWT: 315 feet using single walking stick in Lt UE  GAIT: Distance walked: 315' in 2 min  Assistive device utilized: single walking stick Level of assistance: Modified independence Comments: Rt early stance phase Trendelenburg  TREATMENT DATE:   07/25/23: Recumbent Bike (seated slightly reclined) 8 mins- PT presents to discuss status SAQ (3# lb ankle weight) 2 x 10 PPT x 20 with TA activation and ball squeeze  Hooklying Hip Adduction Isometric with Ball x 20 Prone hip extension isometrics with resistance from PT 5 2x10  Seated Isometric Hip Abduction and ER  2 x 10 holding 5 sec Prone knee flexion on Rt 2x10 added 2# weight  Gait around clinic with cane- fatigue at the end- full loop around building  07/22/23: Recumbent Bike (seated slightly reclined) 8 mins- PT presents to discuss status SAQ (2.5# lb ankle weight) 2 x 10 PPT x 20 with TA activation Hooklying Hip Adduction Isometric with Ball x 20 Prone hip extension isometrics with resistance from PT 5 2x10  Seated Isometric Hip Abduction and ER  2 x 10 holding 5 sec Prone knee flexion on Rt  2x10 Gait around clinic with walking stick- fatigue at the end- full loop around building  07/19/23: Recumbent Bike (seated slightly reclined) 8 mins- PT presents to discuss status SAQ #2 lb ankle weight) 2 x 10 PPT x 20 Hooklying Hip Adduction Isometric with Ball x 20 Quadruped Rocking Backward 2 x 10 Seated Isometric Hip Abduction 2 x 10 holding 5 sec Standing Knee Flexion AROM with Chair Support 2 x 10 holding 5 sec Patient education & PT performed scar tissue massage                                                                                                            PATIENT EDUCATION:  Education details: [email or web address] Person educated: Patient Education method: Explanation, Demonstration, Verbal cues, and Handouts Education comprehension: verbalized understanding and returned demonstration  HOME EXERCISE PROGRAM: Access Code: [email or web address] URL: [email or web address] Date: 07/12/2023 Prepared by: Orvil Beuhring  Exercises - Supine Posterior Pelvic Tilt  - 2 x daily - 7 x weekly - 2 sets - 10 reps - Supine Short Arc Quad  - 2 x daily - 7 x weekly - 2 sets - 10 reps - Supine Hip Adduction Isometric  with Ball  - 2 x daily - 7 x weekly - 2 sets - 10 reps - 5 hold - Quadruped Rocking Backward  - 2 x daily - 7 x weekly - 1-2 sets - 10 reps - 5 hold - Seated Isometric Hip Abduction  - 2 x daily - 7 x weekly - 2 sets - 10 reps - 5 hold - Standing Knee Flexion AROM with Chair Support  - 2 x daily - 7 x weekly - 2 sets - 10 reps  ASSESSMENT:  CLINICAL IMPRESSION: Galaxy presents to therapy with minimal pain. Pt is doing well post-op and is performing post-op exercises as allowed by protocol. Gait is antalgic with cane and she fatigues with exercises.  PT added weight to prone knee extension and pt had difficulty maintaining neutral hip. PT monitored patient throughout to make sure she abided by motion restrictions and monitored for pain. Patient will benefit from skilled PT to  address the below impairments and improve overall function.   OBJECTIVE IMPAIRMENTS: Abnormal gait, decreased balance, decreased coordination, difficulty walking, decreased strength, increased edema, impaired flexibility, improper body mechanics, postural dysfunction, and pain.   ACTIVITY LIMITATIONS: lifting, bending, squatting, stairs, and locomotion level  PARTICIPATION LIMITATIONS: cleaning, laundry, shopping, community activity, and yard work  PERSONAL FACTORS: Age are also affecting patient's functional outcome.   REHAB POTENTIAL: Excellent  CLINICAL DECISION MAKING: Evolving/moderate complexity  EVALUATION COMPLEXITY: Moderate   GOALS: Goals reviewed with patient? Yes  SHORT TERM GOALS: Target date: 08/09/23  Pt will be ind with initial HEP Baseline: Goal status: In Progress 07/16/23  2.  Pt will be educated on movements to avoid to follow post-op protocol to protect tendon transfer site Baseline:  Goal status: MET  3.  Pt will demo improved Rt hip control in stance phase of gait with single walking stick as needed. Baseline:  Goal status: INITIAL    LONG TERM GOALS: Target date: 09/06/23  Pt will be ind with advanced HEP Baseline:  Goal status: INITIAL  2.  Pt will improve LEFS score to at least 55/80 to demo improved function Baseline: 44/80 Goal status: INITIAL  3.  Pt will perform 6 min walk test covering at least 700' with walking stick as needed Baseline:  Goal status: INITIAL  4.  Pt will be able to demo squat, lift 10lb, carry 10lb and walk to mimic household chores and yard activities with proper body mechanics without pain. Baseline:  Goal status: INITIAL  5.  Pt will improve glut strength to at least 4+/5 to improve gait mechanics, stairs and transfers. Baseline:  Goal status: INITIAL  6.  5x STS improved to 11 sec or less to demo improved functional strength and reduce fall risk. Baseline: 13.23 Goal status: INITIAL  PLAN:  PT FREQUENCY:  2x/week  PT DURATION: 8 weeks  PLANNED INTERVENTIONS: 97110-Therapeutic exercises, 97530- Therapeutic activity, [id number]- Neuromuscular re-education, 97535- Self Care, [id number]- Manual therapy, [phone]- Gait training, Patient/Family education, Balance training, Stair training, Cryotherapy, and Moist heat.  PLAN FOR NEXT SESSION:  Continue to work on gait endurance, follow protocol, moves to week 4 on 07/30/23. follow protocol scanned into media section of chart from Dr. Genelle Burnard Joy, PT 07/25/23 10:14 AM   [facility] [address], [address] [place], [place] [address] Phone # [phone] Fax [phone]

## 2023-07-31 ENCOUNTER — Ambulatory Visit: Admitting: Physical Therapy

## 2023-08-01 ENCOUNTER — Ambulatory Visit: Attending: Orthopaedic Surgery | Admitting: Rehabilitative and Restorative Service Providers"

## 2023-08-01 ENCOUNTER — Encounter: Payer: Self-pay | Admitting: Rehabilitative and Restorative Service Providers"

## 2023-08-01 DIAGNOSIS — R252 Cramp and spasm: Secondary | ICD-10-CM | POA: Insufficient documentation

## 2023-08-01 DIAGNOSIS — M6281 Muscle weakness (generalized): Secondary | ICD-10-CM | POA: Insufficient documentation

## 2023-08-01 DIAGNOSIS — R2689 Other abnormalities of gait and mobility: Secondary | ICD-10-CM | POA: Diagnosis not present

## 2023-08-01 DIAGNOSIS — M25551 Pain in right hip: Secondary | ICD-10-CM | POA: Insufficient documentation

## 2023-08-01 DIAGNOSIS — R293 Abnormal posture: Secondary | ICD-10-CM | POA: Insufficient documentation

## 2023-08-01 DIAGNOSIS — R262 Difficulty in walking, not elsewhere classified: Secondary | ICD-10-CM | POA: Insufficient documentation

## 2023-08-01 NOTE — Therapy (Signed)
 OUTPATIENT PHYSICAL THERAPY TREATMENT NOTE   Patient Name: Rebecca Long MRN: [id number] DOB:1950/06/07, 73 y.o., female Today's Date: 08/01/2023  END OF SESSION:  PT End of Session - 08/01/23 1235     Visit Number 6    Date for PT Re-Evaluation 09/06/23    Authorization Type HTA no auth required    Progress Note Due on Visit 10    PT Start Time 1230    PT Stop Time 1310    PT Time Calculation (min) 40 min    Activity Tolerance Patient tolerated treatment well    Behavior During Therapy WFL for tasks assessed/performed             Past Medical History:  Diagnosis Date   Anxiety    Cavus foot, acquired 09/09/2012    bilateral cavus feet the one on the left shows that she has some probable neurogenic weakness with abnormal curvature and in inversion contracture Podiatry 06/2015: H&P and x-ray reviewed with patient. Today I went ahead and I did a proximal nerve block I was able to aspirate the second MPJ and got out amount of clear fluid and injected with a quarter cc dexamethasone  Kenalog  and ap   DDD (degenerative disc disease), lumbar    Depression    Endometrial polyp    GERD (gastroesophageal reflux disease)    Hiatal hernia    Hip flexor tendinitis, right 07/02/2016   History of adenomatous polyp of colon    History of esophageal stricture    History of gastric polyp 11/2017   History of PCR DNA positive for HSV2 11/12/2016   History of primary hyperparathyroidism    s/p  left parathryoidectomy 09-17-2008-- resolved   Hyperlipidemia    Hypertension    Insomnia    Lumbar stenosis    Osteopenia    PONV (postoperative nausea and vomiting)    Schatzki's ring    Scoliosis    Tendinopathy of right biceps tendon 07/02/2016   Thickened endometrium    Uterine fibroid    UTI (urinary tract infection) due to Enterococcus 01/05/2020   Wears glasses    Past Surgical History:  Procedure Laterality Date   BREAST REDUCTION SURGERY Bilateral 2000   COLONOSCOPY     CYST  EXCISION  1985   head and rt axilla   DILATATION & CURETTAGE/HYSTEROSCOPY WITH MYOSURE N/A 08/13/2018   Procedure: DILATATION & CURETTAGE/HYSTEROSCOPY WITH MYOSURE;  Surgeon: Lavoie, Marie-Lyne, MD;  Location: [facility];  Service: Gynecology;  Laterality: N/A;  requests 10:15am in [place] Gyn block request 30 minutes   DILATION AND CURETTAGE OF UTERUS N/A 08/13/2018   ECTOPIC PREGNANCY SURGERY  1981   OPEN SURGICAL REPAIR OF GLUTEAL TENDON Right 07/02/2023   Procedure: REPAIR, TENDON, GLUTEUS MEDIUS, OPEN;  Surgeon: Genelle Standing, MD;  Location: [facility];  Service: Orthopedics;  Laterality: Right;  RIGHT GLUTEUS MAXIMUS TENDON TRANSFER WITH COLLAGEN PATCH AUGMENTATION   PARATHYROIDECTOMY Left 09/17/2008   dr jeoffrey dawn  @[facility]    left superior  (adenoma)   REDUCTION MAMMAPLASTY Bilateral 2000   years ago   SHOULDER SURGERY Right 2007   frozen   UPPER GASTROINTESTINAL ENDOSCOPY  last one 12-18-2017   dr wilhelmenia   Patient Active Problem List   Diagnosis Date Noted   Tendinopathy of gluteus medius 07/02/2023   Greater trochanteric bursitis of right hip 04/17/2022   C. difficile diarrhea 03/08/2022   Arthritis of right sacroiliac joint (HCC) 02/26/2022   Encounter for monitoring long-term proton pump  inhibitor therapy 07/05/2021   Hx of colonic polyps 06/06/2021   Incontinence of feces with fecal urgency 06/06/2021   Sleep disorder 11/29/2020   Benign paroxysmal positional vertigo of right ear 08/30/2020   AC (acromioclavicular) arthritis 10/22/2019   Adhesive bursitis of left shoulder 07/22/2019   GERD with esophagitis 12/30/2017   Hyperlipidemia 05/15/2017   Morbid obesity (HCC) 11/12/2016   Essential hypertension, benign 10/06/2014   Depression with anxiety 10/06/2014   Stress incontinence 10/06/2014   Osteoporosis 10/06/2014   Degenerative disc disease, lumbar 09/09/2012   Spinal stenosis of lumbar region 09/09/2012    PCP: Charlies Bellini,  DO  REFERRING PROVIDER: Elspeth Parker, MD  REFERRING DIAG: post-op REPAIR, TENDON, GLUTEUS MEDIUS, OPEN (Right) - RIGHT GLUTEUS MAXIMUS TENDON TRANSFER WITH COLLAGEN PATCH AUGMENTATION  Rationale for Evaluation and Treatment: Rehabilitation  THERAPY DIAG:  Muscle weakness (generalized)  Pain in right hip  Other abnormalities of gait and mobility  ONSET DATE: 07/02/23 surgery date - chronic pain and weakness prior to surgery  SUBJECTIVE:                                                                                                                                                                                           SUBJECTIVE STATEMENT: Pt reports that she has noticed that she's been having more trouble performing sit to stand from her recliner.  PERTINENT HISTORY:    PAIN:  PAIN:  Are you having pain? Yes NPRS scale: 0-1/10 Pain location: Rt lateral hip Pain orientation: Right  PAIN TYPE: aching Pain description: intermittent, dull, and aching  Aggravating factors: laying on Rt side, getting in/out bed, in/out of car, walking Relieving factors: sitting   PRECAUTIONS: Other: protocol from Dr. Parker scanned into media section of chart - avoid until 6 weeks post-op which is 08/13/23: passive ADDuction and ER, active ABDuction and IR  RED FLAGS: None   WEIGHT BEARING RESTRICTIONS: PWB initially, doctor just cleared to get rid of walking stick when ready  FALLS:  Has patient fallen in last 6 months? Yes. Number of falls 1, while carrying a planter and going up stairs  LIVING ENVIRONMENT: Lives with: lives with their spouse Lives in: House/apartment Stairs: Yes: External: 3 steps; on left going up Has following equipment at home: Single point cane  OCCUPATION: retired  PLOF: Independent  PATIENT GOALS: walk without pain and walking stick, can I get back to pickleball?  NEXT MD VISIT:  OBJECTIVE:  Note: Objective measures were completed at Evaluation unless  otherwise noted.  DIAGNOSTIC FINDINGS:  Xray (3 views right hip): No femoral acetabular osteoarthritis   MRI (right hip): There is  an end-stage full-thickness tear of the gluteus medius with significant atrophy on T1 coronal view  PATIENT SURVEYS:  LEFS: 44/80  COGNITION: Overall cognitive status: Within functional limits for tasks assessed     SENSATION: WFL  MUSCLE LENGTH:   POSTURE: flexed trunk   PALPATION: Mild tenderness around incision - steri strips placed today after stitches were removed  LUMBAR ROM:  WFL  LOWER EXTREMITY ROM:    Pt able to perform seated march A/ROM and quadruped rocking for hip flexion beyond 90 deg without pain  LOWER EXTREMITY MMT:   Lt LE 4+/5 Rt hip at least 3+/5 - isometric testing only, resisted isometrics strong and painfree   FUNCTIONAL TESTS:  Eval: 5 times sit to stand: 13.23 Timed up and go (TUG): 12.24 using walking stick 2 MWT: 315 feet using single walking stick in Lt UE  08/01/2023: 6 minute walk test:  759 ft with SPC  GAIT: Distance walked: 315' in 2 min  Assistive device utilized: single walking stick Level of assistance: Modified independence Comments: Rt early stance phase Trendelenburg  TREATMENT DATE:   08/01/2023: Nustep level 3 x6 min with PT present to discuss status Sit to/from stand x10 6 minute walk test:  759 ft with SPC SAQ (3# lb ankle weight) 2 x 10 Hooklying Hip Adduction Isometric with Ball x 20 Supine hip extension isometric into PT mat x10 with 5 sec hold Prone knee flexion on Rt 2x10 added 2# weight  Manual therapy for scar mobilization and soft tissue mobilization to right glute/hip   07/25/23: Recumbent Bike (seated slightly reclined) 8 mins- PT presents to discuss status SAQ (3# lb ankle weight) 2 x 10 PPT x 20 with TA activation and ball squeeze  Hooklying Hip Adduction Isometric with Ball x 20 Prone hip extension isometrics with resistance from PT 5 2x10  Seated Isometric Hip  Abduction and ER  2 x 10 holding 5 sec Prone knee flexion on Rt 2x10 added 2# weight  Gait around clinic with cane- fatigue at the end- full loop around building   07/22/23: Recumbent Bike (seated slightly reclined) 8 mins- PT presents to discuss status SAQ (2.5# lb ankle weight) 2 x 10 PPT x 20 with TA activation Hooklying Hip Adduction Isometric with Ball x 20 Prone hip extension isometrics with resistance from PT 5 2x10  Seated Isometric Hip Abduction and ER  2 x 10 holding 5 sec Prone knee flexion on Rt 2x10 Gait around clinic with walking stick- fatigue at the end- full loop around building                                                                                                            PATIENT EDUCATION:  Education details: [email or web address] Person educated: Patient Education method: Explanation, Demonstration, Verbal cues, and Handouts Education comprehension: verbalized understanding and returned demonstration  HOME EXERCISE PROGRAM: Access Code: [email or web address] URL: [email or web address] Date: 07/12/2023 Prepared by: Orvil Beuhring  Exercises - Supine Posterior Pelvic Tilt  - 2 x daily - 7 x weekly - 2 sets -  10 reps - Supine Short Arc Quad  - 2 x daily - 7 x weekly - 2 sets - 10 reps - Supine Hip Adduction Isometric with Ball  - 2 x daily - 7 x weekly - 2 sets - 10 reps - 5 hold - Quadruped Rocking Backward  - 2 x daily - 7 x weekly - 1-2 sets - 10 reps - 5 hold - Seated Isometric Hip Abduction  - 2 x daily - 7 x weekly - 2 sets - 10 reps - 5 hold - Standing Knee Flexion AROM with Chair Support  - 2 x daily - 7 x weekly - 2 sets - 10 reps  ASSESSMENT:  CLINICAL IMPRESSION: Oretha presents to therapy with minimal pain. Pt is doing well post-op and is performing post-op exercises as allowed by protocol. Patient states that she has little to no pain noted and she was able to participate in a 6 minute walk test today.  Patient educated in supine hamstring  isometrics, as this is something that she can do at home.  Patient requested manual therapy today to be sure that she is performing scar mobilization correctly at home.  Patient with great motivation and participation throughout session.   OBJECTIVE IMPAIRMENTS: Abnormal gait, decreased balance, decreased coordination, difficulty walking, decreased strength, increased edema, impaired flexibility, improper body mechanics, postural dysfunction, and pain.   ACTIVITY LIMITATIONS: lifting, bending, squatting, stairs, and locomotion level  PARTICIPATION LIMITATIONS: cleaning, laundry, shopping, community activity, and yard work  PERSONAL FACTORS: Age are also affecting patient's functional outcome.   REHAB POTENTIAL: Excellent  CLINICAL DECISION MAKING: Evolving/moderate complexity  EVALUATION COMPLEXITY: Moderate   GOALS: Goals reviewed with patient? Yes  SHORT TERM GOALS: Target date: 08/09/23  Pt will be ind with initial HEP Baseline: Goal status: Met on 08/01/23  2.  Pt will be educated on movements to avoid to follow post-op protocol to protect tendon transfer site Baseline:  Goal status: MET  3.  Pt will demo improved Rt hip control in stance phase of gait with single walking stick as needed. Baseline:  Goal status: Ongoing    LONG TERM GOALS: Target date: 09/06/23  Pt will be ind with advanced HEP Baseline:  Goal status: INITIAL  2.  Pt will improve LEFS score to at least 55/80 to demo improved function Baseline: 44/80 Goal status: INITIAL  3.  Pt will perform 6 min walk test covering at least 700' with walking stick as needed Baseline:  Goal status: INITIAL  4.  Pt will be able to demo squat, lift 10lb, carry 10lb and walk to mimic household chores and yard activities with proper body mechanics without pain. Baseline:  Goal status: INITIAL  5.  Pt will improve glut strength to at least 4+/5 to improve gait mechanics, stairs and transfers. Baseline:  Goal status:  INITIAL  6.  5x STS improved to 11 sec or less to demo improved functional strength and reduce fall risk. Baseline: 13.23 Goal status: INITIAL  PLAN:  PT FREQUENCY: 2x/week  PT DURATION: 8 weeks  PLANNED INTERVENTIONS: 97110-Therapeutic exercises, 97530- Therapeutic activity, [id number]- Neuromuscular re-education, 97535- Self Care, [id number]- Manual therapy, [phone]- Gait training, Patient/Family education, Balance training, Stair training, Cryotherapy, and Moist heat.  PLAN FOR NEXT SESSION:  Continue to work on gait endurance, follow protocol, moves to week 4 on 07/30/23. follow protocol scanned into media section of chart from Dr. Genelle Jarrell Laming, PT, DPT 08/01/23, 1:20 PM  [facility] [phone]  [address], [address] [place], [place] [address] Phone # [phone] Fax [phone]

## 2023-08-05 ENCOUNTER — Encounter: Payer: Self-pay | Admitting: Physical Therapy

## 2023-08-05 ENCOUNTER — Ambulatory Visit: Payer: Self-pay | Admitting: Physical Therapy

## 2023-08-05 DIAGNOSIS — R2689 Other abnormalities of gait and mobility: Secondary | ICD-10-CM

## 2023-08-05 DIAGNOSIS — M6281 Muscle weakness (generalized): Secondary | ICD-10-CM

## 2023-08-05 DIAGNOSIS — M25551 Pain in right hip: Secondary | ICD-10-CM

## 2023-08-05 NOTE — Therapy (Signed)
 OUTPATIENT PHYSICAL THERAPY TREATMENT NOTE   Patient Name: Rebecca Long MRN: [id number] DOB:December 05, 1950, 73 y.o., female Today's Date: 08/05/2023  END OF SESSION:  PT End of Session - 08/05/23 1153     Visit Number 7    Date for PT Re-Evaluation 09/06/23    Authorization Type HTA no auth required    Progress Note Due on Visit 10    PT Start Time 1103    PT Stop Time 1144    PT Time Calculation (min) 41 min    Activity Tolerance Patient tolerated treatment well    Behavior During Therapy WFL for tasks assessed/performed              Past Medical History:  Diagnosis Date   Anxiety    Cavus foot, acquired 09/09/2012    bilateral cavus feet the one on the left shows that she has some probable neurogenic weakness with abnormal curvature and in inversion contracture Podiatry 06/2015: H&P and x-ray reviewed with patient. Today I went ahead and I did a proximal nerve block I was able to aspirate the second MPJ and got out amount of clear fluid and injected with a quarter cc dexamethasone  Kenalog  and ap   DDD (degenerative disc disease), lumbar    Depression    Endometrial polyp    GERD (gastroesophageal reflux disease)    Hiatal hernia    Hip flexor tendinitis, right 07/02/2016   History of adenomatous polyp of colon    History of esophageal stricture    History of gastric polyp 11/2017   History of PCR DNA positive for HSV2 11/12/2016   History of primary hyperparathyroidism    s/p  left parathryoidectomy 09-17-2008-- resolved   Hyperlipidemia    Hypertension    Insomnia    Lumbar stenosis    Osteopenia    PONV (postoperative nausea and vomiting)    Schatzki's ring    Scoliosis    Tendinopathy of right biceps tendon 07/02/2016   Thickened endometrium    Uterine fibroid    UTI (urinary tract infection) due to Enterococcus 01/05/2020   Wears glasses    Past Surgical History:  Procedure Laterality Date   BREAST REDUCTION SURGERY Bilateral 2000   COLONOSCOPY     CYST  EXCISION  1985   head and rt axilla   DILATATION & CURETTAGE/HYSTEROSCOPY WITH MYOSURE N/A 08/13/2018   Procedure: DILATATION & CURETTAGE/HYSTEROSCOPY WITH MYOSURE;  Surgeon: Lavoie, Marie-Lyne, MD;  Location: [facility];  Service: Gynecology;  Laterality: N/A;  requests 10:15am in [place] Gyn block request 30 minutes   DILATION AND CURETTAGE OF UTERUS N/A 08/13/2018   ECTOPIC PREGNANCY SURGERY  1981   OPEN SURGICAL REPAIR OF GLUTEAL TENDON Right 07/02/2023   Procedure: REPAIR, TENDON, GLUTEUS MEDIUS, OPEN;  Surgeon: Genelle Standing, MD;  Location: [facility];  Service: Orthopedics;  Laterality: Right;  RIGHT GLUTEUS MAXIMUS TENDON TRANSFER WITH COLLAGEN PATCH AUGMENTATION   PARATHYROIDECTOMY Left 09/17/2008   dr jeoffrey dawn  @[facility]    left superior  (adenoma)   REDUCTION MAMMAPLASTY Bilateral 2000   years ago   SHOULDER SURGERY Right 2007   frozen   UPPER GASTROINTESTINAL ENDOSCOPY  last one 12-18-2017   dr wilhel[place]ia   Patient Active Problem List   Diagnosis Date Noted   Tendinopathy of gluteus medius 07/02/2023   Greater trochanteric bursitis of right hip 04/17/2022   C. difficile diarrhea 03/08/2022   Arthritis of right sacroiliac joint (HCC) 02/26/2022   Encounter for monitoring long-term proton  pump inhibitor therapy 07/05/2021   Hx of colonic polyps 06/06/2021   Incontinence of feces with fecal urgency 06/06/2021   Sleep disorder 11/29/2020   Benign paroxysmal positional vertigo of right ear 08/30/2020   AC (acromioclavicular) arthritis 10/22/2019   Adhesive bursitis of left shoulder 07/22/2019   GERD with esophagitis 12/30/2017   Hyperlipidemia 05/15/2017   Morbid obesity (HCC) 11/12/2016   Essential hypertension, benign 10/06/2014   Depression with anxiety 10/06/2014   Stress incontinence 10/06/2014   Osteoporosis 10/06/2014   Degenerative disc disease, lumbar 09/09/2012   Spinal stenosis of lumbar region 09/09/2012    PCP: Charlies Bellini,  DO  REFERRING PROVIDER: Elspeth Parker, MD  REFERRING DIAG: post-op REPAIR, TENDON, GLUTEUS MEDIUS, OPEN (Right) - RIGHT GLUTEUS MAXIMUS TENDON TRANSFER WITH COLLAGEN PATCH AUGMENTATION  Rationale for Evaluation and Treatment: Rehabilitation  THERAPY DIAG:  Muscle weakness (generalized)  Pain in right hip  Other abnormalities of gait and mobility  ONSET DATE: 07/02/23 surgery date - chronic pain and weakness prior to surgery  SUBJECTIVE:                                                                                                                                                                                           SUBJECTIVE STATEMENT: Patient reports last week she left achy all over. She felt like she was injected with arthritis. Pain started after last treatment session. Pain is 2/10. PERTINENT HISTORY:    PAIN:  PAIN:  Are you having pain? Yes NPRS scale: 0-1/10 Pain location: Rt lateral hip Pain orientation: Right  PAIN TYPE: aching Pain description: intermittent, dull, and aching  Aggravating factors: laying on Rt side, getting in/out bed, in/out of car, walking Relieving factors: sitting   PRECAUTIONS: Other: protocol from Dr. Parker scanned into media section of chart - avoid until 6 weeks post-op which is 08/13/23: passive ADDuction and ER, active ABDuction and IR  RED FLAGS: None   WEIGHT BEARING RESTRICTIONS: PWB initially, doctor just cleared to get rid of walking stick when ready  FALLS:  Has patient fallen in last 6 months? Yes. Number of falls 1, while carrying a planter and going up stairs  LIVING ENVIRONMENT: Lives with: lives with their spouse Lives in: House/apartment Stairs: Yes: External: 3 steps; on left going up Has following equipment at home: Single point cane  OCCUPATION: retired  PLOF: Independent  PATIENT GOALS: walk without pain and walking stick, can I get back to pickleball?  NEXT MD VISIT:  OBJECTIVE:  Note: Objective  measures were completed at Evaluation unless otherwise noted.  DIAGNOSTIC FINDINGS:  Xray (3 views right hip): No femoral acetabular osteoarthritis  MRI (right hip): There is an end-stage full-thickness tear of the gluteus medius with significant atrophy on T1 coronal view  PATIENT SURVEYS:  LEFS: 44/80  COGNITION: Overall cognitive status: Within functional limits for tasks assessed     SENSATION: WFL  MUSCLE LENGTH:   POSTURE: flexed trunk   PALPATION: Mild tenderness around incision - steri strips placed today after stitches were removed  LUMBAR ROM:  WFL  LOWER EXTREMITY ROM:    Pt able to perform seated march A/ROM and quadruped rocking for hip flexion beyond 90 deg without pain  LOWER EXTREMITY MMT:   Lt LE 4+/5 Rt hip at least 3+/5 - isometric testing only, resisted isometrics strong and painfree   FUNCTIONAL TESTS:  Eval: 5 times sit to stand: 13.23 Timed up and go (TUG): 12.24 using walking stick 2 MWT: 315 feet using single walking stick in Lt UE  08/01/2023: 6 minute walk test:  759 ft with SPC  GAIT: Distance walked: 315' in 2 min  Assistive device utilized: single walking stick Level of assistance: Modified independence Comments: Rt early stance phase Trendelenburg  TREATMENT DATE:  08/05/2023: Recumbent Bike Level 1x6 min with PT present to discuss status Standing TKE with blue ball x 20 on Rt  Sit to/from stand x10 SAQ (3# lb ankle weight) 2 x 10 bilateral  Hooklying Hip Adduction Isometric with Ball x 20 Patient education on anatomy of hip using online picture Supine Bridge x 10 Prone knee flexion on Rt 2x10 added 2# weight  Manual therapy for scar mobilization and soft tissue mobilization to right glute/hip  08/01/2023: Nustep level 3 x6 min with PT present to discuss status Sit to/from stand x10 6 minute walk test:  759 ft with SPC SAQ (3# lb ankle weight) 2 x 10 Hooklying Hip Adduction Isometric with Ball x 20 Supine hip extension  isometric into PT mat x10 with 5 sec hold Prone knee flexion on Rt 2x10 added 2# weight  Manual therapy for scar mobilization and soft tissue mobilization to right glute/hip   07/25/23: Recumbent Bike (seated slightly reclined) 8 mins- PT presents to discuss status SAQ (3# lb ankle weight) 2 x 10 PPT x 20 with TA activation and ball squeeze  Hooklying Hip Adduction Isometric with Ball x 20 Prone hip extension isometrics with resistance from PT 5 2x10  Seated Isometric Hip Abduction and ER  2 x 10 holding 5 sec Prone knee flexion on Rt 2x10 added 2# weight  Gait around clinic with cane- fatigue at the end- full loop around building   07/22/23: Recumbent Bike (seated slightly reclined) 8 mins- PT presents to discuss status SAQ (2.5# lb ankle weight) 2 x 10 PPT x 20 with TA activation Hooklying Hip Adduction Isometric with Ball x 20 Prone hip extension isometrics with resistance from PT 5 2x10  Seated Isometric Hip Abduction and ER  2 x 10 holding 5 sec Prone knee flexion on Rt 2x10 Gait around clinic with walking stick- fatigue at the end- full loop around building  PATIENT EDUCATION:  Education details: [phone] Person educated: Patient Education method: Explanation, Demonstration, Verbal cues, and Handouts Education comprehension: verbalized understanding and returned demonstration  HOME EXERCISE PROGRAM: Access Code: [email or web address] URL: [email or web address] Date: 07/12/2023 Prepared by: Orvil Beuhring  Exercises - Supine Posterior Pelvic Tilt  - 2 x daily - 7 x weekly - 2 sets - 10 reps - Supine Short Arc Quad  - 2 x daily - 7 x weekly - 2 sets - 10 reps - Supine Hip Adduction Isometric with Ball  - 2 x daily - 7 x weekly - 2 sets - 10 reps - 5 hold - Quadruped Rocking Backward  - 2 x daily - 7 x weekly - 1-2 sets - 10 reps - 5 hold - Seated Isometric Hip  Abduction  - 2 x daily - 7 x weekly - 2 sets - 10 reps - 5 hold - Standing Knee Flexion AROM with Chair Support  - 2 x daily - 7 x weekly - 2 sets - 10 reps  ASSESSMENT:  CLINICAL IMPRESSION: Shanee verbalized 2/10 pain today. She has been feeling achy all over over the past week. Kept the same exercise intensity today due to patient's fatigue and overall achy feeling. Provided patient education and visual model of where her tear was and what causes a limp. Patient verbalized understanding. Introduced supine bridges today and patient tolerated them well. PT provided verbal and visual cues as needed during treatment session. Patient will benefit from skilled PT to address the below impairments and improve overall function.    OBJECTIVE IMPAIRMENTS: Abnormal gait, decreased balance, decreased coordination, difficulty walking, decreased strength, increased edema, impaired flexibility, improper body mechanics, postural dysfunction, and pain.   ACTIVITY LIMITATIONS: lifting, bending, squatting, stairs, and locomotion level  PARTICIPATION LIMITATIONS: cleaning, laundry, shopping, community activity, and yard work  PERSONAL FACTORS: Age are also affecting patient's functional outcome.   REHAB POTENTIAL: Excellent  CLINICAL DECISION MAKING: Evolving/moderate complexity  EVALUATION COMPLEXITY: Moderate   GOALS: Goals reviewed with patient? Yes  SHORT TERM GOALS: Target date: 08/09/23  Pt will be ind with initial HEP Baseline: Goal status: Met on 08/01/23  2.  Pt will be educated on movements to avoid to follow post-op protocol to protect tendon transfer site Baseline:  Goal status: MET  3.  Pt will demo improved Rt hip control in stance phase of gait with single walking stick as needed. Baseline:  Goal status: Ongoing    LONG TERM GOALS: Target date: 09/06/23  Pt will be ind with advanced HEP Baseline:  Goal status: INITIAL  2.  Pt will improve LEFS score to at least 55/80 to demo  improved function Baseline: 44/80 Goal status: INITIAL  3.  Pt will perform 6 min walk test covering at least 700' with walking stick as needed Baseline:  Goal status: INITIAL  4.  Pt will be able to demo squat, lift 10lb, carry 10lb and walk to mimic household chores and yard activities with proper body mechanics without pain. Baseline:  Goal status: INITIAL  5.  Pt will improve glut strength to at least 4+/5 to improve gait mechanics, stairs and transfers. Baseline:  Goal status: INITIAL  6.  5x STS improved to 11 sec or less to demo improved functional strength and reduce fall risk. Baseline: 13.23 Goal status: INITIAL  PLAN:  PT FREQUENCY: 2x/week  PT DURATION: 8 weeks  PLANNED INTERVENTIONS: 97110-Therapeutic exercises, 97530- Therapeutic activity, [id number]- Neuromuscular re-education, 97535- Self Care, [id number]- Manual therapy, [phone]- Gait training, Patient/Family  education, Balance training, Stair training, Cryotherapy, and Moist heat.  PLAN FOR NEXT SESSION:  Continue to work on gait endurance, follow protocol, moves to week 4 on 07/30/23. follow protocol scanned into media section of chart from Dr. Genelle Kristeen Sar, PT 08/05/23 11:54 AM [facility] [address] [place], [place] [address] Phone # [phone] Fax [phone]

## 2023-08-07 ENCOUNTER — Ambulatory Visit: Payer: Self-pay | Admitting: Physical Therapy

## 2023-08-07 ENCOUNTER — Encounter: Payer: Self-pay | Admitting: Physical Therapy

## 2023-08-07 DIAGNOSIS — M6281 Muscle weakness (generalized): Secondary | ICD-10-CM | POA: Diagnosis not present

## 2023-08-07 DIAGNOSIS — R262 Difficulty in walking, not elsewhere classified: Secondary | ICD-10-CM

## 2023-08-07 DIAGNOSIS — M25551 Pain in right hip: Secondary | ICD-10-CM

## 2023-08-07 DIAGNOSIS — R2689 Other abnormalities of gait and mobility: Secondary | ICD-10-CM

## 2023-08-07 NOTE — Therapy (Signed)
 OUTPATIENT PHYSICAL THERAPY TREATMENT NOTE   Patient Name: Rebecca Long MRN: [id number] DOB:03/12/1950, 73 y.o., female Today's Date: 08/07/2023  END OF SESSION:  PT End of Session - 08/07/23 1412     Visit Number 8    Date for PT Re-Evaluation 09/06/23    Authorization Type HTA no auth required    Progress Note Due on Visit 10    PT Start Time 1410   Pt late   PT Stop Time 1444    PT Time Calculation (min) 34 min    Activity Tolerance Patient tolerated treatment well    Behavior During Therapy WFL for tasks assessed/performed               Past Medical History:  Diagnosis Date   Anxiety    Cavus foot, acquired 09/09/2012    bilateral cavus feet the one on the left shows that she has some probable neurogenic weakness with abnormal curvature and in inversion contracture Podiatry 06/2015: H&P and x-ray reviewed with patient. Today I went ahead and I did a proximal nerve block I was able to aspirate the second MPJ and got out amount of clear fluid and injected with a quarter cc dexamethasone  Kenalog  and ap   DDD (degenerative disc disease), lumbar    Depression    Endometrial polyp    GERD (gastroesophageal reflux disease)    Hiatal hernia    Hip flexor tendinitis, right 07/02/2016   History of adenomatous polyp of colon    History of esophageal stricture    History of gastric polyp 11/2017   History of PCR DNA positive for HSV2 11/12/2016   History of primary hyperparathyroidism    s/p  left parathryoidectomy 09-17-2008-- resolved   Hyperlipidemia    Hypertension    Insomnia    Lumbar stenosis    Osteopenia    PONV (postoperative nausea and vomiting)    Schatzki's ring    Scoliosis    Tendinopathy of right biceps tendon 07/02/2016   Thickened endometrium    Uterine fibroid    UTI (urinary tract infection) due to Enterococcus 01/05/2020   Wears glasses    Past Surgical History:  Procedure Laterality Date   BREAST REDUCTION SURGERY Bilateral 2000   COLONOSCOPY      CYST EXCISION  1985   head and rt axilla   DILATATION & CURETTAGE/HYSTEROSCOPY WITH MYOSURE N/A 08/13/2018   Procedure: DILATATION & CURETTAGE/HYSTEROSCOPY WITH MYOSURE;  Surgeon: Lavoie, Marie-Lyne, MD;  Location: [facility];  Service: Gynecology;  Laterality: N/A;  requests 10:15am in [place] Gyn block request 30 minutes   DILATION AND CURETTAGE OF UTERUS N/A 08/13/2018   ECTOPIC PREGNANCY SURGERY  1981   OPEN SURGICAL REPAIR OF GLUTEAL TENDON Right 07/02/2023   Procedure: REPAIR, TENDON, GLUTEUS MEDIUS, OPEN;  Surgeon: Genelle Standing, MD;  Location: [facility];  Service: Orthopedics;  Laterality: Right;  RIGHT GLUTEUS MAXIMUS TENDON TRANSFER WITH COLLAGEN PATCH AUGMENTATION   PARATHYROIDECTOMY Left 09/17/2008   dr jeoffrey dawn  @[facility]    left superior  (adenoma)   REDUCTION MAMMAPLASTY Bilateral 2000   years ago   SHOULDER SURGERY Right 2007   frozen   UPPER GASTROINTESTINAL ENDOSCOPY  last one 12-18-2017   dr wilhelmenia   Patient Active Problem List   Diagnosis Date Noted   Tendinopathy of gluteus medius 07/02/2023   Greater trochanteric bursitis of right hip 04/17/2022   C. difficile diarrhea 03/08/2022   Arthritis of right sacroiliac joint (HCC) 02/26/2022   Encounter  for monitoring long-term proton pump inhibitor therapy 07/05/2021   Hx of colonic polyps 06/06/2021   Incontinence of feces with fecal urgency 06/06/2021   Sleep disorder 11/29/2020   Benign paroxysmal positional vertigo of right ear 08/30/2020   AC (acromioclavicular) arthritis 10/22/2019   Adhesive bursitis of left shoulder 07/22/2019   GERD with esophagitis 12/30/2017   Hyperlipidemia 05/15/2017   Morbid obesity (HCC) 11/12/2016   Essential hypertension, benign 10/06/2014   Depression with anxiety 10/06/2014   Stress incontinence 10/06/2014   Osteoporosis 10/06/2014   Degenerative disc disease, lumbar 09/09/2012   Spinal stenosis of lumbar region 09/09/2012    PCP:  Charlies Bellini, DO  REFERRING PROVIDER: Elspeth Parker, MD  REFERRING DIAG: post-op REPAIR, TENDON, GLUTEUS MEDIUS, OPEN (Right) - RIGHT GLUTEUS MAXIMUS TENDON TRANSFER WITH COLLAGEN PATCH AUGMENTATION  Rationale for Evaluation and Treatment: Rehabilitation  THERAPY DIAG:  Muscle weakness (generalized)  Pain in right hip  Other abnormalities of gait and mobility  Difficulty in walking, not elsewhere classified  ONSET DATE: 07/02/23 surgery date - chronic pain and weakness prior to surgery  SUBJECTIVE:                                                                                                                                                                                           SUBJECTIVE STATEMENT: My hip feels good unless I am walking.  I really limp without a cane.  I was able to lay on Rt side this morning and sleep for a bit - it hurt but felt good to change positions.   PERTINENT HISTORY:    PAIN:  PAIN:  Are you having pain? Yes NPRS scale: 0-3/10 Pain location: Rt lateral hip Pain orientation: Right  PAIN TYPE: aching Pain description: intermittent, dull, and aching  Aggravating factors: laying on Rt side, in/out of car, walking Relieving factors: sitting   PRECAUTIONS: Other: protocol from Dr. Parker scanned into media section of chart - avoid until 6 weeks post-op which is 08/13/23: passive ADDuction and ER, active ABDuction and IR  RED FLAGS: None   WEIGHT BEARING RESTRICTIONS: PWB initially, doctor just cleared to get rid of walking stick when ready  FALLS:  Has patient fallen in last 6 months? Yes. Number of falls 1, while carrying a planter and going up stairs  LIVING ENVIRONMENT: Lives with: lives with their spouse Lives in: House/apartment Stairs: Yes: External: 3 steps; on left going up Has following equipment at home: Single point cane  OCCUPATION: retired  PLOF: Independent  PATIENT GOALS: walk without pain and walking stick, can I get  back to pickleball?  NEXT MD VISIT:  OBJECTIVE:  Note: Objective measures were completed at Evaluation unless otherwise noted.  DIAGNOSTIC FINDINGS:  Xray (3 views right hip): No femoral acetabular osteoarthritis   MRI (right hip): There is an end-stage full-thickness tear of the gluteus medius with significant atrophy on T1 coronal view  PATIENT SURVEYS:  LEFS: 44/80  COGNITION: Overall cognitive status: Within functional limits for tasks assessed     SENSATION: WFL  MUSCLE LENGTH:   POSTURE: flexed trunk   PALPATION: Mild tenderness around incision - steri strips placed today after stitches were removed  LUMBAR ROM:  WFL  LOWER EXTREMITY ROM:    Pt able to perform seated march A/ROM and quadruped rocking for hip flexion beyond 90 deg without pain  LOWER EXTREMITY MMT:   Lt LE 4+/5 Rt hip at least 3+/5 - isometric testing only, resisted isometrics strong and painfree   FUNCTIONAL TESTS:  Eval: 5 times sit to stand: 13.23 Timed up and go (TUG): 12.24 using walking stick 2 MWT: 315 feet using single walking stick in Lt UE  08/01/2023: 6 minute walk test:  759 ft with [facility] 08/07/23: 6 minute walk test: 781' with SPC  GAIT: Distance walked: 100' in 2 min  Assistive device utilized: single walking stick Level of assistance: Modified independence Comments: Rt early stance phase Trendelenburg  TREATMENT DATE:  08/07/2023: 6 minute walk test: 781' with SPC (RPE 5/10) Supine PPT x10 Supine bridge x10 SAQ (3# lb ankle weight) 2 x 10 bilateral  Supine hooklying bil hip abd isometric into yellow loop band 10x5 Supine hooklying add ball squeeze 10x5 Sit to stand x10 from mat table Standing Rt TKE with thin pink power cord x20  08/05/2023: Recumbent Bike Level 1x6 min with PT present to discuss status Standing TKE with blue ball x 20 on Rt  Sit to/from stand x10 SAQ (3# lb ankle weight) 2 x 10 bilateral  Hooklying Hip Adduction Isometric with Ball x 20 Patient  education on anatomy of hip using online picture Supine Bridge x 10 Prone knee flexion on Rt 2x10 added 2# weight  Manual therapy for scar mobilization and soft tissue mobilization to right glute/hip  08/01/2023: Nustep level 3 x6 min with PT present to discuss status Sit to/from stand x10 6 minute walk test:  759 ft with SPC SAQ (3# lb ankle weight) 2 x 10 Hooklying Hip Adduction Isometric with Ball x 20 Supine hip extension isometric into PT mat x10 with 5 sec hold Prone knee flexion on Rt 2x10 added 2# weight  Manual therapy for scar mobilization and soft tissue mobilization to right glute/hip                                                                                                           PATIENT EDUCATION:  Education details: [email or web address] Person educated: Patient Education method: Explanation, Demonstration, Verbal cues, and Handouts Education comprehension: verbalized understanding and returned demonstration  HOME EXERCISE PROGRAM: Access Code: [email or web address] URL: [email or web address] Date: 07/12/2023 Prepared by: Orvil Jemia Fata  Exercises - Supine Posterior Pelvic Tilt  - 2 x daily -  7 x weekly - 2 sets - 10 reps - Supine Short Arc Quad  - 2 x daily - 7 x weekly - 2 sets - 10 reps - Supine Hip Adduction Isometric with Ball  - 2 x daily - 7 x weekly - 2 sets - 10 reps - 5 hold - Quadruped Rocking Backward  - 2 x daily - 7 x weekly - 1-2 sets - 10 reps - 5 hold - Seated Isometric Hip Abduction  - 2 x daily - 7 x weekly - 2 sets - 10 reps - 5 hold - Standing Knee Flexion AROM with Chair Support  - 2 x daily - 7 x weekly - 2 sets - 10 reps  ASSESSMENT:  CLINICAL IMPRESSION: Angeleah reports pain can range from 0-3/10.  Aggravating symptoms are walking, getting in/out of car and laying on Rt side, although this is improving.  She has increased frontal plane trunk lean when ambulating without cane but with light touch point with cane is able to better control the  trunk and hips.  She is firing all muscles about the Rt hip and knee without pain at this time.  She completed another today with cane and increased her distance to 65' with RPE of 5/10.  She will see her surgeon next week.    OBJECTIVE IMPAIRMENTS: Abnormal gait, decreased balance, decreased coordination, difficulty walking, decreased strength, increased edema, impaired flexibility, improper body mechanics, postural dysfunction, and pain.   ACTIVITY LIMITATIONS: lifting, bending, squatting, stairs, and locomotion level  PARTICIPATION LIMITATIONS: cleaning, laundry, shopping, community activity, and yard work  PERSONAL FACTORS: Age are also affecting patient's functional outcome.   REHAB POTENTIAL: Excellent  CLINICAL DECISION MAKING: Evolving/moderate complexity  EVALUATION COMPLEXITY: Moderate   GOALS: Goals reviewed with patient? Yes  SHORT TERM GOALS: Target date: 08/09/23  Pt will be ind with initial HEP Baseline: Goal status: Met on 08/01/23  2.  Pt will be educated on movements to avoid to follow post-op protocol to protect tendon transfer site Baseline:  Goal status: MET  3.  Pt will demo improved Rt hip control in stance phase of gait with single walking stick as needed. Baseline:  Goal status: MET 08/07/23    LONG TERM GOALS: Target date: 09/06/23  Pt will be ind with advanced HEP Baseline:  Goal status: INITIAL  2.  Pt will improve LEFS score to at least 55/80 to demo improved function Baseline: 44/80 Goal status: INITIAL  3.  Pt will perform 6 min walk test covering at least 700' with walking stick as needed Baseline:  Goal status: MET 08/07/23 with walking stick  4.  Pt will be able to demo squat, lift 10lb, carry 10lb and walk to mimic household chores and yard activities with proper body mechanics without pain. Baseline:  Goal status: INITIAL  5.  Pt will improve glut strength to at least 4+/5 to improve gait mechanics, stairs and  transfers. Baseline:  Goal status: INITIAL  6.  5x STS improved to 11 sec or less to demo improved functional strength and reduce fall risk. Baseline: 13.23 Goal status: INITIAL  PLAN:  PT FREQUENCY: 2x/week  PT DURATION: 8 weeks  PLANNED INTERVENTIONS: 97110-Therapeutic exercises, 97530- Therapeutic activity, [id number]- Neuromuscular re-education, 97535- Self Care, [id number]- Manual therapy, [id number]- Gait training, Patient/Family education, Balance training, Stair training, Cryotherapy, and Moist heat.  PLAN FOR NEXT SESSION:  Continue to work on gait endurance, follow protocol, moves to week 4 on 07/30/23. follow protocol scanned into media section  of chart from Dr. Genelle Orvil Fester, PT 08/07/23 2:46 PM  [facility] [address] [place], [place] [address] Phone # [phone] Fax [phone]

## 2023-08-12 ENCOUNTER — Encounter: Payer: Self-pay | Admitting: Physical Therapy

## 2023-08-12 ENCOUNTER — Ambulatory Visit: Admitting: Physical Therapy

## 2023-08-12 DIAGNOSIS — R262 Difficulty in walking, not elsewhere classified: Secondary | ICD-10-CM

## 2023-08-12 DIAGNOSIS — M6281 Muscle weakness (generalized): Secondary | ICD-10-CM

## 2023-08-12 DIAGNOSIS — R2689 Other abnormalities of gait and mobility: Secondary | ICD-10-CM

## 2023-08-12 DIAGNOSIS — M25551 Pain in right hip: Secondary | ICD-10-CM

## 2023-08-12 NOTE — Therapy (Signed)
 OUTPATIENT PHYSICAL THERAPY TREATMENT NOTE   Patient Name: Rebecca Long MRN: [id number] DOB:01/20/1951, 73 y.o., female Today's Date: 08/12/2023  END OF SESSION:  PT End of Session - 08/12/23 0939     Visit Number 9    Date for PT Re-Evaluation 09/06/23    Authorization Type HTA no auth required    Progress Note Due on Visit 10    PT Start Time 0933    PT Stop Time 1014    PT Time Calculation (min) 41 min    Activity Tolerance Patient tolerated treatment well    Behavior During Therapy [facility] for tasks assessed/performed                Past Medical History:  Diagnosis Date   Anxiety    Cavus foot, acquired 09/09/2012    bilateral cavus feet the one on the left shows that she has some probable neurogenic weakness with abnormal curvature and in inversion contracture Podiatry 06/2015: H&P and x-ray reviewed with patient. Today I went ahead and I did a proximal nerve block I was able to aspirate the second MPJ and got out amount of clear fluid and injected with a quarter cc dexamethasone  Kenalog  and ap   DDD (degenerative disc disease), lumbar    Depression    Endometrial polyp    GERD (gastroesophageal reflux disease)    Hiatal hernia    Hip flexor tendinitis, right 07/02/2016   History of adenomatous polyp of colon    History of esophageal stricture    History of gastric polyp 11/2017   History of PCR DNA positive for HSV2 11/12/2016   History of primary hyperparathyroidism    s/p  left parathryoidectomy 09-17-2008-- resolved   Hyperlipidemia    Hypertension    Insomnia    Lumbar stenosis    Osteopenia    PONV (postoperative nausea and vomiting)    Schatzki's ring    Scoliosis    Tendinopathy of right biceps tendon 07/02/2016   Thickened endometrium    Uterine fibroid    UTI (urinary tract infection) due to Enterococcus 01/05/2020   Wears glasses    Past Surgical History:  Procedure Laterality Date   BREAST REDUCTION SURGERY Bilateral 2000   COLONOSCOPY      CYST EXCISION  1985   head and rt axilla   DILATATION & CURETTAGE/HYSTEROSCOPY WITH MYOSURE N/A 08/13/2018   Procedure: DILATATION & CURETTAGE/HYSTEROSCOPY WITH MYOSURE;  Surgeon: Lavoie, Marie-Lyne, MD;  Location: [facility];  Service: Gynecology;  Laterality: N/A;  requests 10:15am in [place] Gyn block request 30 minutes   DILATION AND CURETTAGE OF UTERUS N/A 08/13/2018   ECTOPIC PREGNANCY SURGERY  1981   OPEN SURGICAL REPAIR OF GLUTEAL TENDON Right 07/02/2023   Procedure: REPAIR, TENDON, GLUTEUS MEDIUS, OPEN;  Surgeon: Genelle Standing, MD;  Location: [facility];  Service: Orthopedics;  Laterality: Right;  RIGHT GLUTEUS MAXIMUS TENDON TRANSFER WITH COLLAGEN PATCH AUGMENTATION   PARATHYROIDECTOMY Left 09/17/2008   dr jeoffrey dawn  @[facility]    left superior  (adenoma)   REDUCTION MAMMAPLASTY Bilateral 2000   years ago   SHOULDER SURGERY Right 2007   frozen   UPPER GASTROINTESTINAL ENDOSCOPY  last one 12-18-2017   dr wilhelmenia   Patient Active Problem List   Diagnosis Date Noted   Tendinopathy of gluteus medius 07/02/2023   Greater trochanteric bursitis of right hip 04/17/2022   C. difficile diarrhea 03/08/2022   Arthritis of right sacroiliac joint (HCC) 02/26/2022   Encounter for monitoring  long-term proton pump inhibitor therapy 07/05/2021   Hx of colonic polyps 06/06/2021   Incontinence of feces with fecal urgency 06/06/2021   Sleep disorder 11/29/2020   Benign paroxysmal positional vertigo of right ear 08/30/2020   AC (acromioclavicular) arthritis 10/22/2019   Adhesive bursitis of left shoulder 07/22/2019   GERD with esophagitis 12/30/2017   Hyperlipidemia 05/15/2017   Morbid obesity (HCC) 11/12/2016   Essential hypertension, benign 10/06/2014   Depression with anxiety 10/06/2014   Stress incontinence 10/06/2014   Osteoporosis 10/06/2014   Degenerative disc disease, lumbar 09/09/2012   Spinal stenosis of lumbar region 09/09/2012    PCP: Charlies Bellini, DO  REFERRING PROVIDER: Elspeth Parker, MD  REFERRING DIAG: post-op REPAIR, TENDON, GLUTEUS MEDIUS, OPEN (Right) - RIGHT GLUTEUS MAXIMUS TENDON TRANSFER WITH COLLAGEN PATCH AUGMENTATION  Rationale for Evaluation and Treatment: Rehabilitation  THERAPY DIAG:  Muscle weakness (generalized)  Pain in right hip  Other abnormalities of gait and mobility  Difficulty in walking, not elsewhere classified  ONSET DATE: 07/02/23 surgery date - chronic pain and weakness prior to surgery  SUBJECTIVE:                                                                                                                                                                                           SUBJECTIVE STATEMENT: I am frustrated by my limp. The surgeon told me about 50% of patients with this surgery will lose the limp and I hope I am one of them.  It is terrible without the cane. I am doing 30 min on the bike several times a week at the gym   PERTINENT HISTORY:    PAIN:  PAIN:  Are you having pain? Yes NPRS scale: 0-3/10 Pain location: Rt lateral hip Pain orientation: Right  PAIN TYPE: aching Pain description: intermittent, dull, and aching  Aggravating factors: laying on Rt side, in/out of car, walking Relieving factors: sitting   PRECAUTIONS: Other: protocol from Dr. Parker scanned into media section of chart - avoid until 6 weeks post-op which is 08/13/23: passive ADDuction and ER, active ABDuction and IR  RED FLAGS: None   WEIGHT BEARING RESTRICTIONS: PWB initially, doctor just cleared to get rid of walking stick when ready  FALLS:  Has patient fallen in last 6 months? Yes. Number of falls 1, while carrying a planter and going up stairs  LIVING ENVIRONMENT: Lives with: lives with their spouse Lives in: House/apartment Stairs: Yes: External: 3 steps; on left going up Has following equipment at home: Single point cane  OCCUPATION: retired  PLOF: Independent  PATIENT  GOALS: walk without pain and walking stick, can I get  back to pickleball?  NEXT MD VISIT:  OBJECTIVE:  Note: Objective measures were completed at Evaluation unless otherwise noted.  DIAGNOSTIC FINDINGS:  Xray (3 views right hip): No femoral acetabular osteoarthritis   MRI (right hip): There is an end-stage full-thickness tear of the gluteus medius with significant atrophy on T1 coronal view  PATIENT SURVEYS:  LEFS: 44/80  COGNITION: Overall cognitive status: Within functional limits for tasks assessed     SENSATION: WFL  MUSCLE LENGTH:   POSTURE: flexed trunk   PALPATION: Mild tenderness around incision - steri strips placed today after stitches were removed  LUMBAR ROM:  WFL  LOWER EXTREMITY ROM:    Pt able to perform seated march A/ROM and quadruped rocking for hip flexion beyond 90 deg without pain  LOWER EXTREMITY MMT:   Lt LE 4+/5 Rt hip at least 3+/5 - isometric testing only, resisted isometrics strong and painfree   FUNCTIONAL TESTS:  Eval: 5 times sit to stand: 13.23 Timed up and go (TUG): 12.24 using walking stick 2 MWT: 315 feet using single walking stick in Lt UE  08/01/2023: 6 minute walk test:  759 ft with [facility] 08/07/23: 6 minute walk test: 781' with SPC  GAIT: Distance walked: 7' in 2 min  Assistive device utilized: single walking stick Level of assistance: Modified independence Comments: Rt early stance phase Trendelenburg  TREATMENT DATE:  08/12/2023: Recumbent bike L2 x 6' PT present to discuss stages of healing for soft tissues and protocol timeline TKE 20x5 thin red power cord 2 fwd and lateral step ups Rt LE x10 each Leg press seat 6 60lb x10, 70lb x 10 Supine SLR 2x8 Supine bridge x10 Squat to mat table x10 Pain-free range hip rotations on stool x1' Bil heel raises x 10 Weight shifting in stagger stance in parallel bars into front Rt LE x 10, then step through and back with Lt LE working on closed chain stability for gait     08/07/2023: 6 minute walk test: 781' with SPC (RPE 5/10) Supine PPT x10 Supine bridge x10 SAQ (3# lb ankle weight) 2 x 10 bilateral  Supine hooklying bil hip abd isometric into yellow loop band 10x5 Supine hooklying add ball squeeze 10x5 Sit to stand x10 from mat table Standing Rt TKE with thin pink power cord x20  08/05/2023: Recumbent Bike Level 1x6 min with PT present to discuss status Standing TKE with blue ball x 20 on Rt  Sit to/from stand x10 SAQ (3# lb ankle weight) 2 x 10 bilateral  Hooklying Hip Adduction Isometric with Ball x 20 Patient education on anatomy of hip using online picture Supine Bridge x 10 Prone knee flexion on Rt 2x10 added 2# weight  Manual therapy for scar mobilization and soft tissue mobilization to right glute/hip                                                                                                           PATIENT EDUCATION:  Education details: [email or web address] Person educated: Patient Education method: Explanation, Demonstration, Verbal cues, and Handouts Education comprehension:  verbalized understanding and returned demonstration  HOME EXERCISE PROGRAM: Access Code: [email or web address] URL: [email or web address] Date: 07/12/2023 Prepared by: Orvil Lorenda Grecco  Exercises - Supine Posterior Pelvic Tilt  - 2 x daily - 7 x weekly - 2 sets - 10 reps - Supine Short Arc Quad  - 2 x daily - 7 x weekly - 2 sets - 10 reps - Supine Hip Adduction Isometric with Ball  - 2 x daily - 7 x weekly - 2 sets - 10 reps - 5 hold - Quadruped Rocking Backward  - 2 x daily - 7 x weekly - 1-2 sets - 10 reps - 5 hold - Seated Isometric Hip Abduction  - 2 x daily - 7 x weekly - 2 sets - 10 reps - 5 hold - Standing Knee Flexion AROM with Chair Support  - 2 x daily - 7 x weekly - 2 sets - 10 reps  ASSESSMENT:  CLINICAL IMPRESSION: Kashari is frustrated by her limp but PT reminded her she is just this week ready to progress into the next stage of her protocol.  PT  educated Pt on how we can't rush healing of soft tissues.  She was able to tolerate all new therex well without pain and was aware of target tissues especially with 2 step ups.  Gait training for closed chain stabilization today to address glut med activation - Pt will laterally lean to Rt in stance phase without cane.  Pt sees surgeon next week.     OBJECTIVE IMPAIRMENTS: Abnormal gait, decreased balance, decreased coordination, difficulty walking, decreased strength, increased edema, impaired flexibility, improper body mechanics, postural dysfunction, and pain.   ACTIVITY LIMITATIONS: lifting, bending, squatting, stairs, and locomotion level  PARTICIPATION LIMITATIONS: cleaning, laundry, shopping, community activity, and yard work  PERSONAL FACTORS: Age are also affecting patient's functional outcome.   REHAB POTENTIAL: Excellent  CLINICAL DECISION MAKING: Evolving/moderate complexity  EVALUATION COMPLEXITY: Moderate   GOALS: Goals reviewed with patient? Yes  SHORT TERM GOALS: Target date: 08/09/23  Pt will be ind with initial HEP Baseline: Goal status: Met on 08/01/23  2.  Pt will be educated on movements to avoid to follow post-op protocol to protect tendon transfer site Baseline:  Goal status: MET  3.  Pt will demo improved Rt hip control in stance phase of gait with single walking stick as needed. Baseline:  Goal status: MET 08/07/23    LONG TERM GOALS: Target date: 09/06/23  Pt will be ind with advanced HEP Baseline:  Goal status: INITIAL  2.  Pt will improve LEFS score to at least 55/80 to demo improved function Baseline: 44/80 Goal status: INITIAL  3.  Pt will perform 6 min walk test covering at least 700' with walking stick as needed Baseline:  Goal status: MET 08/07/23 with walking stick  4.  Pt will be able to demo squat, lift 10lb, carry 10lb and walk to mimic household chores and yard activities with proper body mechanics without pain. Baseline:  Goal  status: INITIAL  5.  Pt will improve glut strength to at least 4+/5 to improve gait mechanics, stairs and transfers. Baseline:  Goal status: INITIAL  6.  5x STS improved to 11 sec or less to demo improved functional strength and reduce fall risk. Baseline: 13.23 Goal status: INITIAL  PLAN:  PT FREQUENCY: 2x/week  PT DURATION: 8 weeks  PLANNED INTERVENTIONS: 97110-Therapeutic exercises, 97530- Therapeutic activity, [id number]- Neuromuscular re-education, 97535- Self Care, [id number]- Manual therapy, [phone]- Gait training, Patient/Family education, Balance training, Stair training,  Cryotherapy, and Moist heat.  PLAN FOR NEXT SESSION:  PN next time, progress to next stage of protocol this week, monitor tolerance of new therex, Continue to work on gait endurance, follow protocol, moves to week 6 on 08/13/23. follow protocol scanned into media section of chart from Dr. Genelle Orvil Fester, PT 08/12/23 10:14 AM   [facility] [address] [place], [place] [address] Phone # [phone] Fax [phone]

## 2023-08-14 ENCOUNTER — Ambulatory Visit (HOSPITAL_BASED_OUTPATIENT_CLINIC_OR_DEPARTMENT_OTHER): Admitting: Orthopaedic Surgery

## 2023-08-14 ENCOUNTER — Ambulatory Visit: Admitting: Physical Therapy

## 2023-08-14 DIAGNOSIS — S76011A Strain of muscle, fascia and tendon of right hip, initial encounter: Secondary | ICD-10-CM

## 2023-08-14 DIAGNOSIS — M6281 Muscle weakness (generalized): Secondary | ICD-10-CM

## 2023-08-14 DIAGNOSIS — M25551 Pain in right hip: Secondary | ICD-10-CM

## 2023-08-14 DIAGNOSIS — R2689 Other abnormalities of gait and mobility: Secondary | ICD-10-CM

## 2023-08-14 NOTE — Progress Notes (Signed)
 Post Operative Evaluation    Procedure/Date of Surgery: Right hip gluteus maximus tendon transfer 6/3  Interval History:    Presents today 6 weeks status post the above procedure.  She has physical therapy pending at the [facility] location.  Pain in the lateral aspect of the hip is improving although she still slides have soreness with a weakness and a limp in terms of gait.  She is continuing to use her cane   PMH/PSH/Family History/Social History/Meds/Allergies:    Past Medical History:  Diagnosis Date   Anxiety    Cavus foot, acquired 09/09/2012    bilateral cavus feet the one on the left shows that she has some probable neurogenic weakness with abnormal curvature and in inversion contracture Podiatry 06/2015: H&P and x-ray reviewed with patient. Today I went ahead and I did a proximal nerve block I was able to aspirate the second MPJ and got out amount of clear fluid and injected with a quarter cc dexamethasone  Kenalog  and ap   DDD (degenerative disc disease), lumbar    Depression    Endometrial polyp    GERD (gastroesophageal reflux disease)    Hiatal hernia    Hip flexor tendinitis, right 07/02/2016   History of adenomatous polyp of colon    History of esophageal stricture    History of gastric polyp 11/2017   History of PCR DNA positive for HSV2 11/12/2016   History of primary hyperparathyroidism    s/p  left parathryoidectomy 09-17-2008-- resolved   Hyperlipidemia    Hypertension    Insomnia    Lumbar stenosis    Osteopenia    PONV (postoperative nausea and vomiting)    Schatzki's ring    Scoliosis    Tendinopathy of right biceps tendon 07/02/2016   Thickened endometrium    Uterine fibroid    UTI (urinary tract infection) due to Enterococcus 01/05/2020   Wears glasses    Past Surgical History:  Procedure Laterality Date   BREAST REDUCTION SURGERY Bilateral 2000   COLONOSCOPY     CYST EXCISION  1985   head and rt axilla    DILATATION & CURETTAGE/HYSTEROSCOPY WITH MYOSURE N/A 08/13/2018   Procedure: DILATATION & CURETTAGE/HYSTEROSCOPY WITH MYOSURE;  Surgeon: Lavoie, Marie-Lyne, MD;  Location: [facility];  Service: Gynecology;  Laterality: N/A;  requests 10:15am in [place] Gyn block request 30 minutes   DILATION AND CURETTAGE OF UTERUS N/A 08/13/2018   ECTOPIC PREGNANCY SURGERY  1981   OPEN SURGICAL REPAIR OF GLUTEAL TENDON Right 07/02/2023   Procedure: REPAIR, TENDON, GLUTEUS MEDIUS, OPEN;  Surgeon: Genelle Standing, MD;  Location: [facility];  Service: Orthopedics;  Laterality: Right;  RIGHT GLUTEUS MAXIMUS TENDON TRANSFER WITH COLLAGEN PATCH AUGMENTATION   PARATHYROIDECTOMY Left 09/17/2008   dr jeoffrey dawn  @[facility]    left superior  (adenoma)   REDUCTION MAMMAPLASTY Bilateral 2000   years ago   SHOULDER SURGERY Right 2007   frozen   UPPER GASTROINTESTINAL ENDOSCOPY  last one 12-18-2017   dr wilhelmenia   Social History   Socioeconomic History   Marital status: Married    Spouse name: Not on file   Number of children: 1   Years of education: Not on file   Highest education level: Not on file  Occupational History   Not on file  Tobacco Use  Smoking status: Former    Current packs/day: 0.00    Types: Cigarettes    Start date: 01/30/1976    Quit date: 01/30/1991    Years since quitting: 32.5   Smokeless tobacco: Never  Vaping Use   Vaping status: Never Used  Substance and Sexual Activity   Alcohol use: Not Currently   Drug use: Never   Sexual activity: Not Currently    Birth control/protection: Post-menopausal    Comment: 1st intercourse- 17, partners- 10, married- 41 yrs   Other Topics Concern   Not on file  Social History Narrative   Lives with husband. Feels safe at home. 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 Rebecca Long  Owner. Some college. Former smoker. Wears seat belt.    Social Drivers of Corporate investment banker Strain: Low Risk  (04/10/2023)   Overall Financial  Resource Strain (CARDIA)    Difficulty of Paying Living Expenses: Not hard at all  Food Insecurity: No Food Insecurity (04/10/2023)   Hunger Vital Sign    Worried About Running Out of Food in the Last Year: Never true    Ran Out of Food in the Last Year: Never true  Transportation Needs: No Transportation Needs (04/10/2023)   PRAPARE - Administrator, Civil Service (Medical): No    Lack of Transportation (Non-Medical): No  Physical Activity: Inactive (04/10/2023)   Exercise Vital Sign    Days of Exercise per Week: 0 days    Minutes of Exercise per Session: 0 min  Stress: No Stress Concern Present (04/10/2023)   [employer] of Occupational Health - Occupational Stress Questionnaire    Feeling of Stress : Not at all  Social Connections: Moderately Integrated (04/10/2023)   Social Connection and Isolation Panel    Frequency of Communication with Friends and Family: More than three times a week    Frequency of Social Gatherings with Friends and Family: More than three times a week    Attends Religious Services: Never    Database administrator or Organizations: Yes    Attends Engineer, structural: 1 to 4 times per year    Marital Status: Married   Family History  Problem Relation Age of Onset   Breast cancer Mother 9   Heart disease Father 66   Hypertension Sister    Breast cancer Maternal Aunt    Breast cancer Cousin 60   Hypertension Brother    Colon cancer Neg Hx    Esophageal cancer Neg Hx    Stomach cancer Neg Hx    Rectal cancer Neg Hx    Colon polyps Neg Hx    Allergies  Allergen Reactions   Latex Itching and Rash   Polysporin [Bacitracin-Polymyxin B] Rash   Current Outpatient Medications  Medication Sig Dispense Refill   aspirin  EC 325 MG tablet Take 1 tablet (325 mg total) by mouth daily. 14 tablet 0   atorvastatin  (LIPITOR) 20 MG tablet Take 1 tablet (20 mg total) by mouth daily. 90 tablet 3   Calcium  Carbonate-Vit D-Min (CALCIUM  1200 PO)  Take by mouth.     Cholecalciferol (VITAMIN D3) 10 MCG (400 UNIT) CAPS SMARTSIG:1 Capsule(s) By Mouth     FLUoxetine  (PROZAC ) 40 MG capsule Take 1 capsule (40 mg total) by mouth daily. 90 capsule 3   folic acid  (FOLVITE ) 1 MG tablet Take 1 tablet (1 mg total) by mouth daily. 90 tablet 3   omeprazole  (PRILOSEC) 40 MG capsule Take 1 capsule (40 mg total) by mouth daily.  90 capsule 3   oxyCODONE  (ROXICODONE ) 5 MG immediate release tablet Take 1 tablet (5 mg total) by mouth every 4 (four) hours as needed for severe pain (pain score 7-10) or breakthrough pain. 15 tablet 0   telmisartan  (MICARDIS ) 80 MG tablet Take 1 tablet (80 mg total) by mouth daily. 90 tablet 1   valACYclovir  (VALTREX ) 500 MG tablet Take 500 mg by mouth as needed.      No current facility-administered medications for this visit.   No results found.  Review of Systems:   A ROS was performed including pertinent positives and negatives as documented in the HPI.   Musculoskeletal Exam:    There were no vitals taken for this visit.  Right hip incision is well-appearing without erythema or drainage.  Distal neurosensory exam is intact.  Internal/external rotation of the right hip without pain.  There is Trendelenburg gait  Imaging:      I personally reviewed and interpreted the radiographs.   Assessment:   6-week status post right hip gluteus maximus tendon transfer.  Overall her strength is coming along nicely although this is somewhat slow but steady.  She is continuing to work with physical therapy which I do believe she is benefiting from.  I did describe that this is somewhat of the trough of recovery and that overall I do believe she will continue to recover quite well.  Plan :    - Return to clinic 6 weeks for reassessment      I personally saw and evaluated the patient, and participated in the management and treatment plan.  Elspeth Parker, MD Attending Physician, Orthopedic Surgery  This document was  dictated using Dragon voice recognition software. A reasonable attempt at proof reading has been made to minimize errors.

## 2023-08-14 NOTE — Therapy (Signed)
 OUTPATIENT PHYSICAL THERAPY TREATMENT NOTE   Patient Name: Rebecca Long MRN: [id number] DOB:1950/02/18, 73 y.o., female Today's Date: 08/14/2023   Progress Note Reporting Period 07/12/2023 to 08/14/2023  See note below for Objective Data and Assessment of Progress/Goals.     END OF SESSION:  PT End of Session - 08/14/23 1225     Visit Number 10    Date for PT Re-Evaluation 09/06/23    Authorization Type HTA no auth required    Progress Note Due on Visit 10    PT Start Time 1230    PT Stop Time 1315    PT Time Calculation (min) 45 min    Activity Tolerance Patient tolerated treatment well                Past Medical History:  Diagnosis Date   Anxiety    Cavus foot, acquired 09/09/2012    bilateral cavus feet the one on the left shows that she has some probable neurogenic weakness with abnormal curvature and in inversion contracture Podiatry 06/2015: H&P and x-ray reviewed with patient. Today I went ahead and I did a proximal nerve block I was able to aspirate the second MPJ and got out amount of clear fluid and injected with a quarter cc dexamethasone  Kenalog  and ap   DDD (degenerative disc disease), lumbar    Depression    Endometrial polyp    GERD (gastroesophageal reflux disease)    Hiatal hernia    Hip flexor tendinitis, right 07/02/2016   History of adenomatous polyp of colon    History of esophageal stricture    History of gastric polyp 11/2017   History of PCR DNA positive for HSV2 11/12/2016   History of primary hyperparathyroidism    s/p  left parathryoidectomy 09-17-2008-- resolved   Hyperlipidemia    Hypertension    Insomnia    Lumbar stenosis    Osteopenia    PONV (postoperative nausea and vomiting)    Schatzki's ring    Scoliosis    Tendinopathy of right biceps tendon 07/02/2016   Thickened endometrium    Uterine fibroid    UTI (urinary tract infection) due to Enterococcus 01/05/2020   Wears glasses    Past Surgical History:  Procedure  Laterality Date   BREAST REDUCTION SURGERY Bilateral 2000   COLONOSCOPY     CYST EXCISION  1985   head and rt axilla   DILATATION & CURETTAGE/HYSTEROSCOPY WITH MYOSURE N/A 08/13/2018   Procedure: DILATATION & CURETTAGE/HYSTEROSCOPY WITH MYOSURE;  Surgeon: Lavoie, Marie-Lyne, MD;  Location: [facility];  Service: Gynecology;  Laterality: N/A;  requests 10:15am in [place] Gyn block request 30 minutes   DILATION AND CURETTAGE OF UTERUS N/A 08/13/2018   ECTOPIC PREGNANCY SURGERY  1981   OPEN SURGICAL REPAIR OF GLUTEAL TENDON Right 07/02/2023   Procedure: REPAIR, TENDON, GLUTEUS MEDIUS, OPEN;  Surgeon: Genelle Standing, MD;  Location: [facility];  Service: Orthopedics;  Laterality: Right;  RIGHT GLUTEUS MAXIMUS TENDON TRANSFER WITH COLLAGEN PATCH AUGMENTATION   PARATHYROIDECTOMY Left 09/17/2008   dr jeoffrey dawn  @[facility]    left superior  (adenoma)   REDUCTION MAMMAPLASTY Bilateral 2000   years ago   SHOULDER SURGERY Right 2007   frozen   UPPER GASTROINTESTINAL ENDOSCOPY  last one 12-18-2017   dr wilhelmenia   Patient Active Problem List   Diagnosis Date Noted   Tendinopathy of gluteus medius 07/02/2023   Greater trochanteric bursitis of right hip 04/17/2022   C. difficile diarrhea 03/08/2022  Arthritis of right sacroiliac joint (HCC) 02/26/2022   Encounter for monitoring long-term proton pump inhibitor therapy 07/05/2021   Hx of colonic polyps 06/06/2021   Incontinence of feces with fecal urgency 06/06/2021   Sleep disorder 11/29/2020   Benign paroxysmal positional vertigo of right ear 08/30/2020   AC (acromioclavicular) arthritis 10/22/2019   Adhesive bursitis of left shoulder 07/22/2019   GERD with esophagitis 12/30/2017   Hyperlipidemia 05/15/2017   Morbid obesity (HCC) 11/12/2016   Essential hypertension, benign 10/06/2014   Depression with anxiety 10/06/2014   Stress incontinence 10/06/2014   Osteoporosis 10/06/2014   Degenerative disc disease,  lumbar 09/09/2012   Spinal stenosis of lumbar region 09/09/2012    PCP: Charlies Bellini, DO  REFERRING PROVIDER: Elspeth Parker, MD  REFERRING DIAG: post-op REPAIR, TENDON, GLUTEUS MEDIUS, OPEN (Right) - RIGHT GLUTEUS MAXIMUS TENDON TRANSFER WITH COLLAGEN PATCH AUGMENTATION  Rationale for Evaluation and Treatment: Rehabilitation  THERAPY DIAG:  Muscle weakness (generalized)  Pain in right hip  Other abnormalities of gait and mobility  ONSET DATE: 07/02/23 surgery date - chronic pain and weakness prior to surgery  SUBJECTIVE:                                                                                                                                                                                           SUBJECTIVE STATEMENT: Saw the doctor this morning and next follow up in 4 weeks.  I'm in the trough of yuck.  Just using cane mostly around the house but I walk much better with it than without.  PERTINENT HISTORY:  Used to play pickleball  PAIN:  PAIN:  Are you having pain? Yes NPRS scale: 0-2-3/10, no pain at rest, hurts with walking a few minutes Pain location: Rt lateral hip Pain orientation: Right  PAIN TYPE: aching Pain description: intermittent, dull, and aching  Aggravating factors: laying on Rt side, in/out of car, walking Relieving factors: sitting   PRECAUTIONS: Other: protocol from Dr. Parker scanned into media section of chart - avoid until 6 weeks post-op which is 08/13/23: passive ADDuction and ER, active ABDuction and IR  RED FLAGS: None   WEIGHT BEARING RESTRICTIONS: PWB initially, doctor just cleared to get rid of walking stick when ready  FALLS:  Has patient fallen in last 6 months? Yes. Number of falls 1, while carrying a planter and going up stairs  LIVING ENVIRONMENT: Lives with: lives with their spouse Lives in: House/apartment Stairs: Yes: External: 3 steps; on left going up Has following equipment at home: Single point  cane  OCCUPATION: retired  PLOF: Independent  PATIENT GOALS: walk without pain and walking stick, can  I get back to pickleball?  NEXT MD VISIT:  OBJECTIVE:  Note: Objective measures were completed at Evaluation unless otherwise noted.  DIAGNOSTIC FINDINGS:  Xray (3 views right hip): No femoral acetabular osteoarthritis   MRI (right hip): There is an end-stage full-thickness tear of the gluteus medius with significant atrophy on T1 coronal view  PATIENT SURVEYS:  LEFS: 44/80 7/16: LEFS  42/80  COGNITION: Overall cognitive status: Within functional limits for tasks assessed     SENSATION: WFL  MUSCLE LENGTH:   POSTURE: flexed trunk   PALPATION: Mild tenderness around incision - steri strips placed today after stitches were removed  LUMBAR ROM:  WFL  LOWER EXTREMITY ROM:    Pt able to perform seated march A/ROM and quadruped rocking for hip flexion beyond 90 deg without pain  LOWER EXTREMITY MMT:   Lt LE 4+/5 Rt hip at least 3+/5 - isometric testing only, resisted isometrics strong and painfree   FUNCTIONAL TESTS:  Eval: 5 times sit to stand: 13.23 Timed up and go (TUG): 12.24 using walking stick 2 MWT: 315 feet using single walking stick in Lt UE  08/01/2023: 6 minute walk test:  759 ft with [facility] 08/07/23: 6 minute walk test: 781' with [facility] 7/15: 5x STS with hands: 10.40 5x STS no hands 11.19 TUG: with cane: 9.29 sec  GAIT: Distance walked: 315' in 2 min  Assistive device utilized: single walking stick Level of assistance: Modified independence Comments: Rt early stance phase Trendelenburg  TREATMENT DATE:  08/14/2023: Recumbent bike L1 x 5' PT present to discuss stages of healing for soft tissues and protocol timeline 5x STS TUG LEFS Hip joint mobs: grade 2/3 prone posterior-anterior glides with rotation; supine with belt lateral and inferior with rotation Discussion of pelvic control with gait without cane (recommend practice at home in hallway  2-3 minutes 3x/day) also suggested pool walking for gait symmetry 4 fwd and lateral step ups Rt LE x10 each Leg press seat 6 70lb x 20 WB on right with 8 inch step taps with emphasis on glute activation and level pelvis  08/12/2023: Recumbent bike L2 x 6' PT present to discuss stages of healing for soft tissues and protocol timeline TKE 20x5 thin red power cord 2 fwd and lateral step ups Rt LE x10 each Leg press seat 6 60lb x10, 70lb x 10 Supine SLR 2x8 Supine bridge x10 Squat to mat table x10 Pain-free range hip rotations on stool x1' Bil heel raises x 10 Weight shifting in stagger stance in parallel bars into front Rt LE x 10, then step through and back with Lt LE working on closed chain stability for gait    08/07/2023: 6 minute walk test: 781' with SPC (RPE 5/10) Supine PPT x10 Supine bridge x10 SAQ (3# lb ankle weight) 2 x 10 bilateral  Supine hooklying bil hip abd isometric into yellow loop band 10x5 Supine hooklying add ball squeeze 10x5 Sit to stand x10 from mat table Standing Rt TKE with thin pink power cord x20  08/05/2023: Recumbent Bike Level 1x6 min with PT present to discuss status Standing TKE with blue ball x 20 on Rt  Sit to/from stand x10 SAQ (3# lb ankle weight) 2 x 10 bilateral  Hooklying Hip Adduction Isometric with Ball x 20 Patient education on anatomy of hip using online picture Supine Bridge x 10 Prone knee flexion on Rt 2x10 added 2# weight  Manual therapy for scar mobilization and soft tissue mobilization to right glute/hip  PATIENT EDUCATION:  Education details: [phone] Person educated: Patient Education method: Explanation, Demonstration, Verbal cues, and Handouts Education comprehension: verbalized understanding and returned demonstration  HOME EXERCISE PROGRAM: Access Code: [email or web address] URL: [email or web address] Date:  07/12/2023 Prepared by: Orvil Beuhring  Exercises - Supine Posterior Pelvic Tilt  - 2 x daily - 7 x weekly - 2 sets - 10 reps - Supine Short Arc Quad  - 2 x daily - 7 x weekly - 2 sets - 10 reps - Supine Hip Adduction Isometric with Ball  - 2 x daily - 7 x weekly - 2 sets - 10 reps - 5 hold - Quadruped Rocking Backward  - 2 x daily - 7 x weekly - 1-2 sets - 10 reps - 5 hold - Seated Isometric Hip Abduction  - 2 x daily - 7 x weekly - 2 sets - 10 reps - 5 hold - Standing Knee Flexion AROM with Chair Support  - 2 x daily - 7 x weekly - 2 sets - 10 reps  ASSESSMENT:  CLINICAL IMPRESSION: Much improved 5x STS and TUG functional measures indicating improving LE strength and gait speed.  She is progressing steadily with rehab goals. She continues to have gluteal muscle weakness causing a lateral trunk lean during stance phase when ambulating without the cane.  Treatment focus on activating these muscles and strategies to work on this in short bouts at home (in order to prevent compensatory trunk lean when the muscle is overfatigued.  Implemented hip mobilizations in multiple planes with varying degrees of rotation which is needed for dressing tasks and in/out of the bathtub.  Therapist providing verbal cues to optimize technique with  exercises in order to achieve the greatest benefit.    OBJECTIVE IMPAIRMENTS: Abnormal gait, decreased balance, decreased coordination, difficulty walking, decreased strength, increased edema, impaired flexibility, improper body mechanics, postural dysfunction, and pain.   ACTIVITY LIMITATIONS: lifting, bending, squatting, stairs, and locomotion level  PARTICIPATION LIMITATIONS: cleaning, laundry, shopping, community activity, and yard work  PERSONAL FACTORS: Age are also affecting patient's functional outcome.   REHAB POTENTIAL: Excellent  CLINICAL DECISION MAKING: Evolving/moderate complexity  EVALUATION COMPLEXITY: Moderate   GOALS: Goals reviewed with  patient? Yes  SHORT TERM GOALS: Target date: 08/09/23  Pt will be ind with initial HEP Baseline: Goal status: Met on 08/01/23  2.  Pt will be educated on movements to avoid to follow post-op protocol to protect tendon transfer site Baseline:  Goal status: MET  3.  Pt will demo improved Rt hip control in stance phase of gait with single walking stick as needed. Baseline:  Goal status: MET 08/07/23    LONG TERM GOALS: Target date: 09/06/23  Pt will be ind with advanced HEP Baseline:  Goal status: INITIAL  2.  Pt will improve LEFS score to at least 55/80 to demo improved function Baseline: 44/80 Goal status: INITIAL  3.  Pt will perform 6 min walk test covering at least 700' with walking stick as needed Baseline:  Goal status: MET 08/07/23 with walking stick  4.  Pt will be able to demo squat, lift 10lb, carry 10lb and walk to mimic household chores and yard activities with proper body mechanics without pain. Baseline:  Goal status: INITIAL  5.  Pt will improve glut strength to at least 4+/5 to improve gait mechanics, stairs and transfers. Baseline:  Goal status: INITIAL  6.  5x STS improved to 11 sec or less to demo improved functional strength and reduce fall risk. Baseline: 13.23  Goal status: INITIAL  PLAN:  PT FREQUENCY: 2x/week  PT DURATION: 8 weeks  PLANNED INTERVENTIONS: 97110-Therapeutic exercises, 97530- Therapeutic activity, [id number]- Neuromuscular re-education, 97535- Self Care, [id number]- Manual therapy, [phone]- Gait training, Patient/Family education, Balance training, Stair training, Cryotherapy, and Moist heat.  PLAN FOR NEXT SESSION:   monitor tolerance of new therex, hip mobilizations; Continue to work on gait endurance, follow protocol, moves to week 6 on 08/13/23. follow protocol scanned into media section of chart from Dr. Genelle; patient hopes to return to [facility], PT 08/14/23 5:34 PM Phone: [phone] Fax: [phone]    [facility]  [facility] [address] [place], [place] [address] Phone # [phone] Fax [phone]

## 2023-08-19 ENCOUNTER — Ambulatory Visit: Admitting: Physical Therapy

## 2023-08-19 ENCOUNTER — Encounter: Payer: Self-pay | Admitting: Physical Therapy

## 2023-08-19 DIAGNOSIS — M6281 Muscle weakness (generalized): Secondary | ICD-10-CM

## 2023-08-19 DIAGNOSIS — M25551 Pain in right hip: Secondary | ICD-10-CM

## 2023-08-19 DIAGNOSIS — R262 Difficulty in walking, not elsewhere classified: Secondary | ICD-10-CM

## 2023-08-19 DIAGNOSIS — R293 Abnormal posture: Secondary | ICD-10-CM

## 2023-08-19 DIAGNOSIS — R252 Cramp and spasm: Secondary | ICD-10-CM

## 2023-08-19 DIAGNOSIS — R2689 Other abnormalities of gait and mobility: Secondary | ICD-10-CM

## 2023-08-19 NOTE — Therapy (Signed)
 OUTPATIENT PHYSICAL THERAPY TREATMENT NOTE   Patient Name: Rebecca Long MRN: [id number] DOB:[birth year], 73 y.o., female Today's Date: 08/19/2023     END OF SESSION:  PT End of Session - 08/19/23 1227     Visit Number 11    Date for PT Re-Evaluation 09/06/23    Authorization Type HTA no auth required    Progress Note Due on Visit 10    PT Start Time 1145    PT Stop Time 1227    PT Time Calculation (min) 42 min    Activity Tolerance Patient tolerated treatment well    Behavior During Therapy WFL for tasks assessed/performed                 Past Medical History:  Diagnosis Date   Anxiety    Cavus foot, acquired 09/09/2012    bilateral cavus feet the one on the left shows that she has some probable neurogenic weakness with abnormal curvature and in inversion contracture Podiatry 06/2015: H&P and x-ray reviewed with patient. Today I went ahead and I did a proximal nerve block I was able to aspirate the second MPJ and got out amount of clear fluid and injected with a quarter cc dexamethasone  Kenalog  and ap   DDD (degenerative disc disease), lumbar    Depression    Endometrial polyp    GERD (gastroesophageal reflux disease)    Hiatal hernia    Hip flexor tendinitis, right 07/02/2016   History of adenomatous polyp of colon    History of esophageal stricture    History of gastric polyp 11/2017   History of PCR DNA positive for HSV2 11/12/2016   History of primary hyperparathyroidism    s/p  left parathryoidectomy 09-17-2008-- resolved   Hyperlipidemia    Hypertension    Insomnia    Lumbar stenosis    Osteopenia    PONV (postoperative nausea and vomiting)    Schatzki's ring    Scoliosis    Tendinopathy of right biceps tendon 07/02/2016   Thickened endometrium    Uterine fibroid    UTI (urinary tract infection) due to Enterococcus 01/05/2020   Wears glasses    Past Surgical History:  Procedure Laterality Date   BREAST REDUCTION SURGERY Bilateral 2000    COLONOSCOPY     CYST EXCISION  1985   head and rt axilla   DILATATION & CURETTAGE/HYSTEROSCOPY WITH MYOSURE N/A 08/13/2018   Procedure: DILATATION & CURETTAGE/HYSTEROSCOPY WITH MYOSURE;  Surgeon: Lavoie, Marie-Lyne, MD;  Location: [facility];  Service: Gynecology;  Laterality: N/A;  requests 10:15am in [place] [facility] block request 30 minutes   DILATION AND CURETTAGE OF UTERUS N/A 08/13/2018   ECTOPIC PREGNANCY SURGERY  1981   OPEN SURGICAL REPAIR OF GLUTEAL TENDON Right 07/02/2023   Procedure: REPAIR, TENDON, GLUTEUS MEDIUS, OPEN;  Surgeon: Genelle Standing, MD;  Location: [facility];  Service: Orthopedics;  Laterality: Right;  RIGHT GLUTEUS MAXIMUS TENDON TRANSFER WITH COLLAGEN PATCH AUGMENTATION   PARATHYROIDECTOMY Left 09/17/2008   dr jeoffrey dawn  @[facility]    left superior  (adenoma)   REDUCTION MAMMAPLASTY Bilateral 2000   years ago   SHOULDER SURGERY Right 2007   frozen   UPPER GASTROINTESTINAL ENDOSCOPY  last one 12-18-2017   dr wilhelmenia   Patient Active Problem List   Diagnosis Date Noted   Tendinopathy of gluteus medius 07/02/2023   Greater trochanteric bursitis of right hip 04/17/2022   C. difficile diarrhea 03/08/2022   Arthritis of right sacroiliac joint (HCC) 02/26/2022  Encounter for monitoring long-term proton pump inhibitor therapy 07/05/2021   Hx of colonic polyps 06/06/2021   Incontinence of feces with fecal urgency 06/06/2021   Sleep disorder 11/29/2020   Benign paroxysmal positional vertigo of right ear 08/30/2020   AC (acromioclavicular) arthritis 10/22/2019   Adhesive bursitis of left shoulder 07/22/2019   GERD with esophagitis 12/30/2017   Hyperlipidemia 05/15/2017   Morbid obesity (HCC) 11/12/2016   Essential hypertension, benign 10/06/2014   Depression with anxiety 10/06/2014   Stress incontinence 10/06/2014   Osteoporosis 10/06/2014   Degenerative disc disease, lumbar 09/09/2012   Spinal stenosis of lumbar region 09/09/2012     PCP: Charlies Bellini, DO  REFERRING PROVIDER: Elspeth Parker, MD  REFERRING DIAG: post-op REPAIR, TENDON, GLUTEUS MEDIUS, OPEN (Right) - RIGHT GLUTEUS MAXIMUS TENDON TRANSFER WITH COLLAGEN PATCH AUGMENTATION  Rationale for Evaluation and Treatment: Rehabilitation  THERAPY DIAG:  Muscle weakness (generalized)  Pain in right hip  Other abnormalities of gait and mobility  Difficulty in walking, not elsewhere classified  Cramp and spasm  Abnormal posture  ONSET DATE: 07/02/23 surgery date - chronic pain and weakness prior to surgery  SUBJECTIVE:                                                                                                                                                                                           SUBJECTIVE STATEMENT: Doing well. Only pain I get is when I walk.  PERTINENT HISTORY:  Used to play pickleball  PAIN:  PAIN:  Are you having pain? Yes NPRS scale: 0-2-3/10, no pain at rest, hurts with walking a few minutes Pain location: Rt lateral hip Pain orientation: Right  PAIN TYPE: aching Pain description: intermittent, dull, and aching  Aggravating factors: laying on Rt side, in/out of car, walking Relieving factors: sitting   PRECAUTIONS: Other: protocol from Dr. Parker scanned into media section of chart - avoid until 6 weeks post-op which is 08/13/23: passive ADDuction and ER, active ABDuction and IR  RED FLAGS: None   WEIGHT BEARING RESTRICTIONS: PWB initially, doctor just cleared to get rid of walking stick when ready  FALLS:  Has patient fallen in last 6 months? Yes. Number of falls 1, while carrying a planter and going up stairs  LIVING ENVIRONMENT: Lives with: lives with their spouse Lives in: House/apartment Stairs: Yes: External: 3 steps; on left going up Has following equipment at home: Single point cane  OCCUPATION: retired  PLOF: Independent  PATIENT GOALS: walk without pain and walking stick, can I get back to  pickleball?  NEXT MD VISIT: 3 weeks  OBJECTIVE:  Note: Objective measures were completed at Evaluation  unless otherwise noted.  DIAGNOSTIC FINDINGS:  Xray (3 views right hip): No femoral acetabular osteoarthritis   MRI (right hip): There is an end-stage full-thickness tear of the gluteus medius with significant atrophy on T1 coronal view  PATIENT SURVEYS:  LEFS: 44/80 7/16: LEFS  42/80  COGNITION: Overall cognitive status: Within functional limits for tasks assessed     SENSATION: WFL  MUSCLE LENGTH:   POSTURE: flexed trunk   PALPATION: Mild tenderness around incision - steri strips placed today after stitches were removed  LUMBAR ROM:  WFL  LOWER EXTREMITY ROM:    Pt able to perform seated march A/ROM and quadruped rocking for hip flexion beyond 90 deg without pain  LOWER EXTREMITY MMT:   Lt LE 4+/5 Rt hip at least 3+/5 - isometric testing only, resisted isometrics strong and painfree   FUNCTIONAL TESTS:  Eval: 5 times sit to stand: 13.23 Timed up and go (TUG): 12.24 using walking stick 2 MWT: 315 feet using single walking stick in Lt UE  08/01/2023: 6 minute walk test:  759 ft with [facility] 08/07/23: 6 minute walk test: 781' with [facility] 7/15: 5x STS with hands: 10.40 5x STS no hands 11.19 TUG: with cane: 9.29 sec  GAIT: Distance walked: 315' in 2 min  Assistive device utilized: single walking stick Level of assistance: Modified independence Comments: Rt early stance phase Trendelenburg  TREATMENT DATE:  08/19/2023: Recumbent bike L1 x 5' PT present to discuss stages of healing for soft tissues and protocol timeline Stool rotation with hip IR/ER  Squat to mat + foam x 10, then without foam x 10 6 fwd and 4 lateral step ups Rt LE 2 x10 each Leg press seat 6 70lb x 20 Standing 4 way hip + march 2 x 10 ea R Supine SLR 2x10 B for core Supine bridge 2x10 WB on right with 8 inch step taps with emphasis on glute activation and level pelvis BAPS L2 wt shifting  forward onto R working on level pelvis   08/14/2023: Recumbent bike L1 x 5' PT present to discuss stages of healing for soft tissues and protocol timeline 5x STS TUG LEFS Hip joint mobs: grade 2/3 prone posterior-anterior glides with rotation; supine with belt lateral and inferior with rotation Discussion of pelvic control with gait without cane (recommend practice at home in hallway 2-3 minutes 3x/day) also suggested pool walking for gait symmetry 4 fwd and lateral step ups Rt LE x10 each Leg press seat 6 70lb x 20 WB on right with 8 inch step taps with emphasis on glute activation and level pelvis  08/12/2023: Recumbent bike L2 x 6' PT present to discuss stages of healing for soft tissues and protocol timeline TKE 20x5 thin red power cord 2 fwd and lateral step ups Rt LE x10 each Leg press seat 6 60lb x10, 70lb x 10 Supine SLR 2x8 Supine bridge x10 Squat to mat table x10 Pain-free range hip rotations on stool x1' Bil heel raises x 10 Weight shifting in stagger stance in parallel bars into front Rt LE x 10, then step through and back with Lt LE working on closed chain stability for gait  PATIENT EDUCATION:  Education details: [phone] Person educated: Patient Education method: Explanation, Demonstration, Verbal cues, and Handouts Education comprehension: verbalized understanding and returned demonstration  HOME EXERCISE PROGRAM: Access Code: [email or web address] URL: [email or web address] Date: 07/12/2023 Prepared by: Orvil Beuhring  Exercises - Supine Posterior Pelvic Tilt  - 2 x daily - 7 x weekly - 2 sets - 10 reps - Supine Short Arc Quad  - 2 x daily - 7 x weekly - 2 sets - 10 reps - Supine Hip Adduction Isometric with Ball  - 2 x daily - 7 x weekly - 2 sets - 10 reps - 5 hold - Quadruped Rocking Backward  - 2 x daily - 7 x weekly - 1-2 sets - 10 reps - 5  hold - Seated Isometric Hip Abduction  - 2 x daily - 7 x weekly - 2 sets - 10 reps - 5 hold - Standing Knee Flexion AROM with Chair Support  - 2 x daily - 7 x weekly - 2 sets - 10 reps  ASSESSMENT:  CLINICAL IMPRESSION: Patient tolerated new TE today. She reports some discomfort with lateral step up to 6 inch so we stayed at 4 inches. Core challenged with SLR in supine, but patient able to stabilize with VC. Progress 4 way hip to HEP next visit if not increased pain.    OBJECTIVE IMPAIRMENTS: Abnormal gait, decreased balance, decreased coordination, difficulty walking, decreased strength, increased edema, impaired flexibility, improper body mechanics, postural dysfunction, and pain.   ACTIVITY LIMITATIONS: lifting, bending, squatting, stairs, and locomotion level  PARTICIPATION LIMITATIONS: cleaning, laundry, shopping, community activity, and yard work  PERSONAL FACTORS: Age are also affecting patient's functional outcome.   REHAB POTENTIAL: Excellent  CLINICAL DECISION MAKING: Evolving/moderate complexity  EVALUATION COMPLEXITY: Moderate   GOALS: Goals reviewed with patient? Yes  SHORT TERM GOALS: Target date: 08/09/23  Pt will be ind with initial HEP Baseline: Goal status: Met on 08/01/23  2.  Pt will be educated on movements to avoid to follow post-op protocol to protect tendon transfer site Baseline:  Goal status: MET  3.  Pt will demo improved Rt hip control in stance phase of gait with single walking stick as needed. Baseline:  Goal status: MET 08/07/23    LONG TERM GOALS: Target date: 09/06/23  Pt will be ind with advanced HEP Baseline:  Goal status: INITIAL  2.  Pt will improve LEFS score to at least 55/80 to demo improved function Baseline: 44/80 Goal status: INITIAL  3.  Pt will perform 6 min walk test covering at least 700' with walking stick as needed Baseline:  Goal status: MET 08/07/23 with walking stick  4.  Pt will be able to demo squat, lift 10lb,  carry 10lb and walk to mimic household chores and yard activities with proper body mechanics without pain. Baseline:  Goal status: INITIAL  5.  Pt will improve glut strength to at least 4+/5 to improve gait mechanics, stairs and transfers. Baseline:  Goal status: INITIAL  6.  5x STS improved to 11 sec or less to demo improved functional strength and reduce fall risk. Baseline: 13.23 Goal status: INITIAL  PLAN:  PT FREQUENCY: 2x/week  PT DURATION: 8 weeks  PLANNED INTERVENTIONS: 97110-Therapeutic exercises, 97530- Therapeutic activity, [id number]- Neuromuscular re-education, 97535- Self Care, [id number]- Manual therapy, [phone]- Gait training, Patient/Family education, Balance training, Stair training, Cryotherapy, and Moist heat.  PLAN FOR NEXT SESSION:   monitor tolerance of new therex, hip mobilizations; Continue to work on gait endurance, follow protocol, moves  to week 6 on 08/13/23. follow protocol scanned into media section of chart from Dr. Genelle; patient hopes to return to pickleball   Mliss Cummins, PT  08/19/23 12:32 PM Phone: [phone] Fax: [phone]    [facility] [address] [place], [place] [address] Phone # [phone] Fax [phone]

## 2023-08-21 ENCOUNTER — Ambulatory Visit

## 2023-08-21 DIAGNOSIS — R262 Difficulty in walking, not elsewhere classified: Secondary | ICD-10-CM

## 2023-08-21 DIAGNOSIS — R2689 Other abnormalities of gait and mobility: Secondary | ICD-10-CM

## 2023-08-21 DIAGNOSIS — M6281 Muscle weakness (generalized): Secondary | ICD-10-CM | POA: Diagnosis not present

## 2023-08-21 DIAGNOSIS — M25551 Pain in right hip: Secondary | ICD-10-CM

## 2023-08-21 NOTE — Therapy (Signed)
 OUTPATIENT PHYSICAL THERAPY TREATMENT NOTE   Patient Name: Rebecca Long MRN: [id number] DOB:Nov 16, 1950, 73 y.o., female Today's Date: 08/21/2023     END OF SESSION:  PT End of Session - 08/21/23 1215     Visit Number 12    Date for PT Re-Evaluation 09/06/23    Authorization Type HTA no auth required    Progress Note Due on Visit 20    PT Start Time 1147    PT Stop Time 1228    PT Time Calculation (min) 41 min    Activity Tolerance Patient tolerated treatment well    Behavior During Therapy [facility] for tasks assessed/performed                  Past Medical History:  Diagnosis Date   Anxiety    Cavus foot, acquired 09/09/2012    bilateral cavus feet the one on the left shows that she has some probable neurogenic weakness with abnormal curvature and in inversion contracture Podiatry 06/2015: H&P and x-ray reviewed with patient. Today I went ahead and I did a proximal nerve block I was able to aspirate the second MPJ and got out amount of clear fluid and injected with a quarter cc dexamethasone  Kenalog  and ap   DDD (degenerative disc disease), lumbar    Depression    Endometrial polyp    GERD (gastroesophageal reflux disease)    Hiatal hernia    Hip flexor tendinitis, right 07/02/2016   History of adenomatous polyp of colon    History of esophageal stricture    History of gastric polyp 11/2017   History of PCR DNA positive for HSV2 11/12/2016   History of primary hyperparathyroidism    s/p  left parathryoidectomy 09-17-2008-- resolved   Hyperlipidemia    Hypertension    Insomnia    Lumbar stenosis    Osteopenia    PONV (postoperative nausea and vomiting)    Schatzki's ring    Scoliosis    Tendinopathy of right biceps tendon 07/02/2016   Thickened endometrium    Uterine fibroid    UTI (urinary tract infection) due to Enterococcus 01/05/2020   Wears glasses    Past Surgical History:  Procedure Laterality Date   BREAST REDUCTION SURGERY Bilateral 2000    COLONOSCOPY     CYST EXCISION  1985   head and rt axilla   DILATATION & CURETTAGE/HYSTEROSCOPY WITH MYOSURE N/A 08/13/2018   Procedure: DILATATION & CURETTAGE/HYSTEROSCOPY WITH MYOSURE;  Surgeon: Lavoie, Marie-Lyne, MD;  Location: [facility];  Service: Gynecology;  Laterality: N/A;  requests 10:15am in [place] Gyn block request 30 minutes   DILATION AND CURETTAGE OF UTERUS N/A 08/13/2018   ECTOPIC PREGNANCY SURGERY  1981   OPEN SURGICAL REPAIR OF GLUTEAL TENDON Right 07/02/2023   Procedure: REPAIR, TENDON, GLUTEUS MEDIUS, OPEN;  Surgeon: Genelle Standing, MD;  Location: [facility];  Service: Orthopedics;  Laterality: Right;  RIGHT GLUTEUS MAXIMUS TENDON TRANSFER WITH COLLAGEN PATCH AUGMENTATION   PARATHYROIDECTOMY Left 09/17/2008   dr jeoffrey dawn  @[facility]    left superior  (adenoma)   REDUCTION MAMMAPLASTY Bilateral 2000   years ago   SHOULDER SURGERY Right 2007   frozen   UPPER GASTROINTESTINAL ENDOSCOPY  last one 12-18-2017   dr wilhel[place]ia   Patient Active Problem List   Diagnosis Date Noted   Tendinopathy of gluteus medius 07/02/2023   Greater trochanteric bursitis of right hip 04/17/2022   C. difficile diarrhea 03/08/2022   Arthritis of right sacroiliac joint (HCC) 02/26/2022  Encounter for monitoring long-term proton pump inhibitor therapy 07/05/2021   Hx of colonic polyps 06/06/2021   Incontinence of feces with fecal urgency 06/06/2021   Sleep disorder 11/29/2020   Benign paroxysmal positional vertigo of right ear 08/30/2020   AC (acromioclavicular) arthritis 10/22/2019   Adhesive bursitis of left shoulder 07/22/2019   GERD with esophagitis 12/30/2017   Hyperlipidemia 05/15/2017   Morbid obesity (HCC) 11/12/2016   Essential hypertension, benign 10/06/2014   Depression with anxiety 10/06/2014   Stress incontinence 10/06/2014   Osteoporosis 10/06/2014   Degenerative disc disease, lumbar 09/09/2012   Spinal stenosis of lumbar region 09/09/2012     PCP: Charlies Bellini, DO  REFERRING PROVIDER: Elspeth Parker, MD  REFERRING DIAG: post-op REPAIR, TENDON, GLUTEUS MEDIUS, OPEN (Right) - RIGHT GLUTEUS MAXIMUS TENDON TRANSFER WITH COLLAGEN PATCH AUGMENTATION  Rationale for Evaluation and Treatment: Rehabilitation  THERAPY DIAG:  Muscle weakness (generalized)  Pain in right hip  Other abnormalities of gait and mobility  Difficulty in walking, not elsewhere classified  ONSET DATE: 07/02/23 surgery date - chronic pain and weakness prior to surgery  SUBJECTIVE:                                                                                                                                                                                           SUBJECTIVE STATEMENT: I was sore after last session but not bad.  I want to walk better.  PERTINENT HISTORY:  Used to play pickleball  PAIN:  PAIN:  Are you having pain? Yes NPRS scale: 0-2-3/10, no pain at rest, hurts with walking a few minutes Pain location: Rt lateral hip Pain orientation: Right  PAIN TYPE: aching Pain description: intermittent, dull, and aching  Aggravating factors: laying on Rt side, in/out of car, walking Relieving factors: sitting   PRECAUTIONS: Other: protocol from Dr. Parker scanned into media section of chart - avoid until 6 weeks post-op which is 08/13/23: passive ADDuction and ER, active ABDuction and IR  RED FLAGS: None   WEIGHT BEARING RESTRICTIONS: PWB initially, doctor just cleared to get rid of walking stick when ready  FALLS:  Has patient fallen in last 6 months? Yes. Number of falls 1, while carrying a planter and going up stairs  LIVING ENVIRONMENT: Lives with: lives with their spouse Lives in: House/apartment Stairs: Yes: External: 3 steps; on left going up Has following equipment at home: Single point cane  OCCUPATION: retired  PLOF: Independent  PATIENT GOALS: walk without pain and walking stick, can I get back to  pickleball?  NEXT MD VISIT: 3 weeks  OBJECTIVE:  Note: Objective measures were completed at Evaluation unless otherwise  noted.  DIAGNOSTIC FINDINGS:  Xray (3 views right hip): No femoral acetabular osteoarthritis   MRI (right hip): There is an end-stage full-thickness tear of the gluteus medius with significant atrophy on T1 coronal view  PATIENT SURVEYS:  LEFS: 44/80 7/16: LEFS  42/80  COGNITION: Overall cognitive status: Within functional limits for tasks assessed     SENSATION: WFL  MUSCLE LENGTH:   POSTURE: flexed trunk   PALPATION: Mild tenderness around incision - steri strips placed today after stitches were removed  LUMBAR ROM:  WFL  LOWER EXTREMITY ROM:    Pt able to perform seated march A/ROM and quadruped rocking for hip flexion beyond 90 deg without pain  LOWER EXTREMITY MMT:   Lt LE 4+/5 Rt hip at least 3+/5 - isometric testing only, resisted isometrics strong and painfree   FUNCTIONAL TESTS:  Eval: 5 times sit to stand: 13.23 Timed up and go (TUG): 12.24 using walking stick 2 MWT: 315 feet using single walking stick in Lt UE  08/01/2023: 6 minute walk test:  759 ft with [facility] 08/07/23: 6 minute walk test: 781' with [facility] 7/15: 5x STS with hands: 10.40 5x STS no hands 11.19 TUG: with cane: 9.29 sec  GAIT: Distance walked: 315' in 2 min  Assistive device utilized: single walking stick Level of assistance: Modified independence Comments: Rt early stance phase Trendelenburg  TREATMENT DATE:  08/21/2023: Recumbent bike L2 x 5' PT present to monitor- Level 3 was too much  Squat to mat + foam x20 with blue band around thigh 6 fwd and 6 lateral step ups Rt LE 2 x10 each Leg press seat 6 70lb  2x10, single leg 40# 2x10 bil Standing 4 way hip + march 2 x 10 ea Rt and Lt- stood on 2 step on Rt due to Lt LE being longer-verbal cues for Rt gluteal     08/19/2023: Recumbent bike L1 x 5' PT present to discuss stages of healing for soft tissues and  protocol timeline Stool rotation with hip IR/ER  Squat to mat + foam x 10, then without foam x 10 6 fwd and 4 lateral step ups Rt LE 2 x10 each Leg press seat 6 70lb x 20 Standing 4 way hip + march 2 x 10 ea R Supine SLR 2x10 B for core Supine bridge 2x10 WB on right with 8 inch step taps with emphasis on glute activation and level pelvis BAPS L2 wt shifting forward onto R working on level pelvis   08/14/2023: Recumbent bike L1 x 5' PT present to discuss stages of healing for soft tissues and protocol timeline 5x STS TUG LEFS Hip joint mobs: grade 2/3 prone posterior-anterior glides with rotation; supine with belt lateral and inferior with rotation Discussion of pelvic control with gait without cane (recommend practice at home in hallway 2-3 minutes 3x/day) also suggested pool walking for gait symmetry 4 fwd and lateral step ups Rt LE x10 each Leg press seat 6 70lb x 20 WB on right with 8 inch step taps with emphasis on glute activation and level pelvis  PATIENT EDUCATION:  Education details: [phone] Person educated: Patient Education method: Explanation, Demonstration, Verbal cues, and Handouts Education comprehension: verbalized understanding and returned demonstration  HOME EXERCISE PROGRAM: Access Code: [email or web address] URL: [email or web address] Date: 07/12/2023 Prepared by: Orvil Beuhring  Exercises - Supine Posterior Pelvic Tilt  - 2 x daily - 7 x weekly - 2 sets - 10 reps - Supine Short Arc Quad  - 2 x daily - 7 x weekly - 2 sets - 10 reps - Supine Hip Adduction Isometric with Ball  - 2 x daily - 7 x weekly - 2 sets - 10 reps - 5 hold - Quadruped Rocking Backward  - 2 x daily - 7 x weekly - 1-2 sets - 10 reps - 5 hold - Seated Isometric Hip Abduction  - 2 x daily - 7 x weekly - 2 sets - 10 reps - 5 hold - Standing Knee Flexion AROM with Chair Support   - 2 x daily - 7 x weekly - 2 sets - 10 reps  ASSESSMENT:  CLINICAL IMPRESSION: Pt continues to demonstrate antalgic gait when not using cane.  She continues to tolerate increased strength progression and stability exercises to work on this. She is working to increase Rt hip abductor activation to level pelvis when on Rt LE.   PT monitored for technique, fatigue and pain.  Patient will benefit from skilled PT to address the below impairments and improve overall function.     OBJECTIVE IMPAIRMENTS: Abnormal gait, decreased balance, decreased coordination, difficulty walking, decreased strength, increased edema, impaired flexibility, improper body mechanics, postural dysfunction, and pain.   ACTIVITY LIMITATIONS: lifting, bending, squatting, stairs, and locomotion level  PARTICIPATION LIMITATIONS: cleaning, laundry, shopping, community activity, and yard work  PERSONAL FACTORS: Age are also affecting patient's functional outcome.   REHAB POTENTIAL: Excellent  CLINICAL DECISION MAKING: Evolving/moderate complexity  EVALUATION COMPLEXITY: Moderate   GOALS: Goals reviewed with patient? Yes  SHORT TERM GOALS: Target date: 08/09/23  Pt will be ind with initial HEP Baseline: Goal status: Met on 08/01/23  2.  Pt will be educated on movements to avoid to follow post-op protocol to protect tendon transfer site Baseline:  Goal status: MET  3.  Pt will demo improved Rt hip control in stance phase of gait with single walking stick as needed. Baseline:  Goal status: MET 08/07/23    LONG TERM GOALS: Target date: 09/06/23  Pt will be ind with advanced HEP Baseline:  Goal status: INITIAL  2.  Pt will improve LEFS score to at least 55/80 to demo improved function Baseline: 44/80 Goal status: INITIAL  3.  Pt will perform 6 min walk test covering at least 700' with walking stick as needed Baseline:  Goal status: MET 08/07/23 with walking stick  4.  Pt will be able to demo squat, lift 10lb,  carry 10lb and walk to mimic household chores and yard activities with proper body mechanics without pain. Baseline:  Goal status: INITIAL  5.  Pt will improve glut strength to at least 4+/5 to improve gait mechanics, stairs and transfers. Baseline:  Goal status: INITIAL  6.  5x STS improved to 11 sec or less to demo improved functional strength and reduce fall risk. Baseline: 13.23 Goal status: INITIAL  PLAN:  PT FREQUENCY: 2x/week  PT DURATION: 8 weeks  PLANNED INTERVENTIONS: 97110-Therapeutic exercises, 97530- Therapeutic activity, [id number]- Neuromuscular re-education, 97535- Self Care, [id number]- Manual therapy, [phone]- Gait training, Patient/Family education, Balance training, Stair training, Cryotherapy, and Moist heat.  PLAN FOR  NEXT SESSION:   monitor tolerance of new therex, hip mobilizations; Continue to work on gait endurance, follow protocol, moves to week 6 on 08/13/23. follow protocol scanned into media section of chart from Dr. Genelle; patient hopes to return to pickleball   Burnard Joy, PT 08/21/23 12:33 PM     [facility] [address], [address] [place], [place] [address] Phone # [phone] Fax [phone]

## 2023-08-26 ENCOUNTER — Telehealth (HOSPITAL_BASED_OUTPATIENT_CLINIC_OR_DEPARTMENT_OTHER): Payer: Self-pay | Admitting: Orthopaedic Surgery

## 2023-08-26 NOTE — Telephone Encounter (Signed)
 Patient would like a handicap plaqued

## 2023-08-27 ENCOUNTER — Ambulatory Visit

## 2023-08-29 ENCOUNTER — Ambulatory Visit: Admitting: Physical Therapy

## 2023-08-29 DIAGNOSIS — R2689 Other abnormalities of gait and mobility: Secondary | ICD-10-CM

## 2023-08-29 DIAGNOSIS — M6281 Muscle weakness (generalized): Secondary | ICD-10-CM | POA: Diagnosis not present

## 2023-08-29 DIAGNOSIS — M25551 Pain in right hip: Secondary | ICD-10-CM

## 2023-08-29 NOTE — Therapy (Signed)
 OUTPATIENT PHYSICAL THERAPY TREATMENT NOTE   Patient Name: Rebecca Long MRN: [id number] DOB:01/29/1951, 73 y.o., female Today's Date: 08/29/2023     END OF SESSION:  PT End of Session - 08/29/23 1148     Visit Number 13    Date for PT Re-Evaluation 09/06/23    Authorization Type HTA no auth required    Progress Note Due on Visit 20    PT Start Time 1146    PT Stop Time 1220   needs to leave early for MD appt   PT Time Calculation (min) 34 min    Activity Tolerance Patient tolerated treatment well                  Past Medical History:  Diagnosis Date   Anxiety    Cavus foot, acquired 09/09/2012    bilateral cavus feet the one on the left shows that she has some probable neurogenic weakness with abnormal curvature and in inversion contracture Podiatry 06/2015: H&P and x-ray reviewed with patient. Today I went ahead and I did a proximal nerve block I was able to aspirate the second MPJ and got out amount of clear fluid and injected with a quarter cc dexamethasone  Kenalog  and ap   DDD (degenerative disc disease), lumbar    Depression    Endometrial polyp    GERD (gastroesophageal reflux disease)    Hiatal hernia    Hip flexor tendinitis, right 07/02/2016   History of adenomatous polyp of colon    History of esophageal stricture    History of gastric polyp 11/2017   History of PCR DNA positive for HSV2 11/12/2016   History of primary hyperparathyroidism    s/p  left parathryoidectomy 09-17-2008-- resolved   Hyperlipidemia    Hypertension    Insomnia    Lumbar stenosis    Osteopenia    PONV (postoperative nausea and vomiting)    Schatzki's ring    Scoliosis    Tendinopathy of right biceps tendon 07/02/2016   Thickened endometrium    Uterine fibroid    UTI (urinary tract infection) due to Enterococcus 01/05/2020   Wears glasses    Past Surgical History:  Procedure Laterality Date   BREAST REDUCTION SURGERY Bilateral 2000   COLONOSCOPY     CYST EXCISION   1985   head and rt axilla   DILATATION & CURETTAGE/HYSTEROSCOPY WITH MYOSURE N/A 08/13/2018   Procedure: DILATATION & CURETTAGE/HYSTEROSCOPY WITH MYOSURE;  Surgeon: Lavoie, Marie-Lyne, MD;  Location: [facility];  Service: Gynecology;  Laterality: N/A;  requests 10:15am in [place] Gyn block request 30 minutes   DILATION AND CURETTAGE OF UTERUS N/A 08/13/2018   ECTOPIC PREGNANCY SURGERY  1981   OPEN SURGICAL REPAIR OF GLUTEAL TENDON Right 07/02/2023   Procedure: REPAIR, TENDON, GLUTEUS MEDIUS, OPEN;  Surgeon: Genelle Standing, MD;  Location: [facility];  Service: Orthopedics;  Laterality: Right;  RIGHT GLUTEUS MAXIMUS TENDON TRANSFER WITH COLLAGEN PATCH AUGMENTATION   PARATHYROIDECTOMY Left 09/17/2008   dr jeoffrey dawn  @[facility]    left superior  (adenoma)   REDUCTION MAMMAPLASTY Bilateral 2000   years ago   SHOULDER SURGERY Right 2007   frozen   UPPER GASTROINTESTINAL ENDOSCOPY  last one 12-18-2017   dr wilhelmenia   Patient Active Problem List   Diagnosis Date Noted   Tendinopathy of gluteus medius 07/02/2023   Greater trochanteric bursitis of right hip 04/17/2022   C. difficile diarrhea 03/08/2022   Arthritis of right sacroiliac joint (HCC) 02/26/2022  Encounter for monitoring long-term proton pump inhibitor therapy 07/05/2021   Hx of colonic polyps 06/06/2021   Incontinence of feces with fecal urgency 06/06/2021   Sleep disorder 11/29/2020   Benign paroxysmal positional vertigo of right ear 08/30/2020   AC (acromioclavicular) arthritis 10/22/2019   Adhesive bursitis of left shoulder 07/22/2019   GERD with esophagitis 12/30/2017   Hyperlipidemia 05/15/2017   Morbid obesity (HCC) 11/12/2016   Essential hypertension, benign 10/06/2014   Depression with anxiety 10/06/2014   Stress incontinence 10/06/2014   Osteoporosis 10/06/2014   Degenerative disc disease, lumbar 09/09/2012   Spinal stenosis of lumbar region 09/09/2012    PCP: Charlies Bellini,  DO  REFERRING PROVIDER: Elspeth Parker, MD  REFERRING DIAG: post-op REPAIR, TENDON, GLUTEUS MEDIUS, OPEN (Right) - RIGHT GLUTEUS MAXIMUS TENDON TRANSFER WITH COLLAGEN PATCH AUGMENTATION  Rationale for Evaluation and Treatment: Rehabilitation  THERAPY DIAG:  Muscle weakness (generalized)  Pain in right hip  Other abnormalities of gait and mobility  ONSET DATE: 07/02/23 surgery date - chronic pain and weakness prior to surgery  SUBJECTIVE:                                                                                                                                                                                           SUBJECTIVE STATEMENT: I'm OK but my husband fell when his knee gave way, hit head and bruised ribs.   I do better walking with the cane than without.   PERTINENT HISTORY:  Used to play pickleball  PAIN:  PAIN:  Are you having pain? Yes NPRS scale: 0 no pain at rest but fatigues quickly Pain location: Rt lateral hip Pain orientation: Right  PAIN TYPE: aching Pain description: intermittent, dull, and aching  Aggravating factors: laying on Rt side, in/out of car, walking Relieving factors: sitting   PRECAUTIONS: Other: protocol from Dr. Parker scanned into media section of chart - avoid until 6 weeks post-op which is 08/13/23: passive ADDuction and ER, active ABDuction and IR  RED FLAGS: None   WEIGHT BEARING RESTRICTIONS: PWB initially, doctor just cleared to get rid of walking stick when ready  FALLS:  Has patient fallen in last 6 months? Yes. Number of falls 1, while carrying a planter and going up stairs  LIVING ENVIRONMENT: Lives with: lives with their spouse Lives in: House/apartment Stairs: Yes: External: 3 steps; on left going up Has following equipment at home: Single point cane  OCCUPATION: retired  PLOF: Independent  PATIENT GOALS: walk without pain and walking stick, can I get back to pickleball?  NEXT MD VISIT: 3 weeks  OBJECTIVE:   Note: Objective measures were completed at  Evaluation unless otherwise noted.  DIAGNOSTIC FINDINGS:  Xray (3 views right hip): No femoral acetabular osteoarthritis   MRI (right hip): There is an end-stage full-thickness tear of the gluteus medius with significant atrophy on T1 coronal view  PATIENT SURVEYS:  LEFS: 44/80 7/16: LEFS  42/80  COGNITION: Overall cognitive status: Within functional limits for tasks assessed     SENSATION: WFL  MUSCLE LENGTH:   POSTURE: flexed trunk   PALPATION: Mild tenderness around incision - steri strips placed today after stitches were removed  LUMBAR ROM:  WFL  LOWER EXTREMITY ROM:    Pt able to perform seated march A/ROM and quadruped rocking for hip flexion beyond 90 deg without pain  LOWER EXTREMITY MMT:   Lt LE 4+/5 Rt hip at least 3+/5 - isometric testing only, resisted isometrics strong and painfree   FUNCTIONAL TESTS:  Eval: 5 times sit to stand: 13.23 Timed up and go (TUG): 12.24 using walking stick 2 MWT: 315 feet using single walking stick in Lt UE  08/01/2023: 6 minute walk test:  759 ft with [facility] 08/07/23: 6 minute walk test: 781' with [facility] 7/15: 5x STS with hands: 10.40 5x STS no hands 11.19 TUG: with cane: 9.29 sec  GAIT: Distance walked: 315' in 2 min  Assistive device utilized: single walking stick Level of assistance: Modified independence Comments: Rt early stance phase Trendelenburg  TREATMENT DATE:  08/29/2023: Recumbent bike L2 x 7' PT present to monitor- reviewed with pt her d Dynamic warm up at the barre: side stepping, heel raises, marching, backwards walking Y cone touches 2x 5 (fatigues quickly) Circle touches WB on right, left toe touches 2 sets of 10x (very challenging, needs 2 handed UE support) Weight shifting on foam; regular stance  and staggered stance without UE assist  Leg press seat 6 70lb  2x10, single leg 30# 2x10 bil (40# felt too difficult therefore decreased the load) Cable column  5# trunk rotation 10x to each side    08/21/2023: Recumbent bike L2 x 5' PT present to monitor- Level 3 was too much  Squat to mat + foam x20 with blue band around thigh 6 fwd and 6 lateral step ups Rt LE 2 x10 each Leg press seat 6 70lb  2x10, single leg 40# 2x10 bil Standing 4 way hip + march 2 x 10 ea Rt and Lt- stood on 2 step on Rt due to Lt LE being longer-verbal cues for Rt gluteal     08/19/2023: Recumbent bike L1 x 5' PT present to discuss stages of healing for soft tissues and protocol timeline Stool rotation with hip IR/ER  Squat to mat + foam x 10, then without foam x 10 6 fwd and 4 lateral step ups Rt LE 2 x10 each Leg press seat 6 70lb x 20 Standing 4 way hip + march 2 x 10 ea R Supine SLR 2x10 B for core Supine bridge 2x10 WB on right with 8 inch step taps with emphasis on glute activation and level pelvis BAPS L2 wt shifting forward onto R working on level pelvis   08/14/2023: Recumbent bike L1 x 5' PT present to discuss stages of healing for soft tissues and protocol timeline 5x STS TUG LEFS Hip joint mobs: grade 2/3 prone posterior-anterior glides with rotation; supine with belt lateral and inferior with rotation Discussion of pelvic control with gait without cane (recommend practice at home in hallway 2-3 minutes 3x/day) also suggested pool walking for gait symmetry 4 fwd and lateral step  ups Rt LE x10 each Leg press seat 6 70lb x 20 WB on right with 8 inch step taps with emphasis on glute activation and level pelvis                                                                                                            PATIENT EDUCATION:  Education details: [email or web address] Person educated: Patient Education method: Explanation, Demonstration, Verbal cues, and Handouts Education comprehension: verbalized understanding and returned demonstration  HOME EXERCISE PROGRAM: Access Code: [email or web address] URL: [email or web address] Date:  07/12/2023 Prepared by: Orvil Beuhring  Exercises - Supine Posterior Pelvic Tilt  - 2 x daily - 7 x weekly - 2 sets - 10 reps - Supine Short Arc Quad  - 2 x daily - 7 x weekly - 2 sets - 10 reps - Supine Hip Adduction Isometric with Ball  - 2 x daily - 7 x weekly - 2 sets - 10 reps - 5 hold - Quadruped Rocking Backward  - 2 x daily - 7 x weekly - 1-2 sets - 10 reps - 5 hold - Seated Isometric Hip Abduction  - 2 x daily - 7 x weekly - 2 sets - 10 reps - 5 hold - Standing Knee Flexion AROM with Chair Support  - 2 x daily - 7 x weekly - 2 sets - 10 reps  ASSESSMENT:  CLINICAL IMPRESSION: Neuromuscular re-ed for gluteal activation with moderate verbal cues provided.  Patient needs bil UE support for stability secondary to quick muscle fatigue with single leg standing causing a pelvic drop.  Continue to progress per surgical protocol (see media section in chart). The patient needs to leave early to make another appt.    OBJECTIVE IMPAIRMENTS: Abnormal gait, decreased balance, decreased coordination, difficulty walking, decreased strength, increased edema, impaired flexibility, improper body mechanics, postural dysfunction, and pain.   ACTIVITY LIMITATIONS: lifting, bending, squatting, stairs, and locomotion level  PARTICIPATION LIMITATIONS: cleaning, laundry, shopping, community activity, and yard work  PERSONAL FACTORS: Age are also affecting patient's functional outcome.   REHAB POTENTIAL: Excellent  CLINICAL DECISION MAKING: Evolving/moderate complexity  EVALUATION COMPLEXITY: Moderate   GOALS: Goals reviewed with patient? Yes  SHORT TERM GOALS: Target date: 08/09/23  Pt will be ind with initial HEP Baseline: Goal status: Met on 08/01/23  2.  Pt will be educated on movements to avoid to follow post-op protocol to protect tendon transfer site Baseline:  Goal status: MET  3.  Pt will demo improved Rt hip control in stance phase of gait with single walking stick as  needed. Baseline:  Goal status: MET 08/07/23    LONG TERM GOALS: Target date: 09/06/23  Pt will be ind with advanced HEP Baseline:  Goal status: INITIAL  2.  Pt will improve LEFS score to at least 55/80 to demo improved function Baseline: 44/80 Goal status: INITIAL  3.  Pt will perform 6 min walk test covering at least 700' with walking stick as needed Baseline:  Goal status: MET 08/07/23 with walking stick  4.  Pt will be  able to demo squat, lift 10lb, carry 10lb and walk to mimic household chores and yard activities with proper body mechanics without pain. Baseline:  Goal status: INITIAL  5.  Pt will improve glut strength to at least 4+/5 to improve gait mechanics, stairs and transfers. Baseline:  Goal status: INITIAL  6.  5x STS improved to 11 sec or less to demo improved functional strength and reduce fall risk. Baseline: 13.23 Goal status: INITIAL  PLAN:  PT FREQUENCY: 2x/week  PT DURATION: 8 weeks  PLANNED INTERVENTIONS: 97110-Therapeutic exercises, 97530- Therapeutic activity, [id number]- Neuromuscular re-education, 97535- Self Care, [id number]- Manual therapy, [phone]- Gait training, Patient/Family education, Balance training, Stair training, Cryotherapy, and Moist heat.  PLAN FOR NEXT SESSION:   monitor tolerance of new therex, hip mobilizations; Continue to work on gait endurance, follow protocol, moves to week 6 on 08/13/23. follow protocol scanned into media section of chart from Dr. Genelle; patient hopes to return to [facility], PT 08/29/23 6:29 PM Phone: [phone] Fax: [phone]   [facility] [address] [place], [place] [address] Phone # [phone] Fax [phone]

## 2023-09-03 ENCOUNTER — Ambulatory Visit: Attending: Orthopaedic Surgery | Admitting: Physical Therapy

## 2023-09-03 DIAGNOSIS — R252 Cramp and spasm: Secondary | ICD-10-CM | POA: Insufficient documentation

## 2023-09-03 DIAGNOSIS — R293 Abnormal posture: Secondary | ICD-10-CM | POA: Insufficient documentation

## 2023-09-03 DIAGNOSIS — M25551 Pain in right hip: Secondary | ICD-10-CM | POA: Diagnosis not present

## 2023-09-03 DIAGNOSIS — M6281 Muscle weakness (generalized): Secondary | ICD-10-CM | POA: Diagnosis not present

## 2023-09-03 DIAGNOSIS — R2689 Other abnormalities of gait and mobility: Secondary | ICD-10-CM | POA: Insufficient documentation

## 2023-09-03 DIAGNOSIS — R262 Difficulty in walking, not elsewhere classified: Secondary | ICD-10-CM | POA: Diagnosis not present

## 2023-09-03 NOTE — Therapy (Signed)
 OUTPATIENT PHYSICAL THERAPY TREATMENT NOTE   Patient Name: Rebecca Long MRN: [id number] DOB:[birth year], 73 y.o., female Today's Date: 09/03/2023     END OF SESSION:  PT End of Session - 09/03/23 1145     Visit Number 14    Date for PT Re-Evaluation 09/06/23    Authorization Type HTA no auth required    Progress Note Due on Visit 20    PT Start Time 1148    PT Stop Time 1230    PT Time Calculation (min) 42 min    Activity Tolerance Patient tolerated treatment well                  Past Medical History:  Diagnosis Date   Anxiety    Cavus foot, acquired 09/09/2012    bilateral cavus feet the one on the left shows that she has some probable neurogenic weakness with abnormal curvature and in inversion contracture Podiatry 06/2015: H&P and x-ray reviewed with patient. Today I went ahead and I did a proximal nerve block I was able to aspirate the second MPJ and got out amount of clear fluid and injected with a quarter cc dexamethasone  Kenalog  and ap   DDD (degenerative disc disease), lumbar    Depression    Endometrial polyp    GERD (gastroesophageal reflux disease)    Hiatal hernia    Hip flexor tendinitis, right 07/02/2016   History of adenomatous polyp of colon    History of esophageal stricture    History of gastric polyp 11/2017   History of PCR DNA positive for HSV2 11/12/2016   History of primary hyperparathyroidism    s/p  left parathryoidectomy 09-17-2008-- resolved   Hyperlipidemia    Hypertension    Insomnia    Lumbar stenosis    Osteopenia    PONV (postoperative nausea and vomiting)    Schatzki's ring    Scoliosis    Tendinopathy of right biceps tendon 07/02/2016   Thickened endometrium    Uterine fibroid    UTI (urinary tract infection) due to Enterococcus 01/05/2020   Wears glasses    Past Surgical History:  Procedure Laterality Date   BREAST REDUCTION SURGERY Bilateral 2000   COLONOSCOPY     CYST EXCISION  1985   head and rt axilla    DILATATION & CURETTAGE/HYSTEROSCOPY WITH MYOSURE N/A 08/13/2018   Procedure: DILATATION & CURETTAGE/HYSTEROSCOPY WITH MYOSURE;  Surgeon: Lavoie, Marie-Lyne, MD;  Location: [facility];  Service: Gynecology;  Laterality: N/A;  requests 10:15am in [place] Gyn block request 30 minutes   DILATION AND CURETTAGE OF UTERUS N/A 08/13/2018   ECTOPIC PREGNANCY SURGERY  1981   OPEN SURGICAL REPAIR OF GLUTEAL TENDON Right 07/02/2023   Procedure: REPAIR, TENDON, GLUTEUS MEDIUS, OPEN;  Surgeon: Genelle Standing, MD;  Location: [facility];  Service: Orthopedics;  Laterality: Right;  RIGHT GLUTEUS MAXIMUS TENDON TRANSFER WITH COLLAGEN PATCH AUGMENTATION   PARATHYROIDECTOMY Left 09/17/2008   dr jeoffrey dawn  @[facility]    left superior  (adenoma)   REDUCTION MAMMAPLASTY Bilateral 2000   years ago   SHOULDER SURGERY Right 2007   frozen   UPPER GASTROINTESTINAL ENDOSCOPY  last one 12-18-2017   dr wilhelmenia   Patient Active Problem List   Diagnosis Date Noted   Tendinopathy of gluteus medius 07/02/2023   Greater trochanteric bursitis of right hip 04/17/2022   C. difficile diarrhea 03/08/2022   Arthritis of right sacroiliac joint (HCC) 02/26/2022   Encounter for monitoring long-term proton pump inhibitor therapy  07/05/2021   Hx of colonic polyps 06/06/2021   Incontinence of feces with fecal urgency 06/06/2021   Sleep disorder 11/29/2020   Benign paroxysmal positional vertigo of right ear 08/30/2020   AC (acromioclavicular) arthritis 10/22/2019   Adhesive bursitis of left shoulder 07/22/2019   GERD with esophagitis 12/30/2017   Hyperlipidemia 05/15/2017   Morbid obesity (HCC) 11/12/2016   Essential hypertension, benign 10/06/2014   Depression with anxiety 10/06/2014   Stress incontinence 10/06/2014   Osteoporosis 10/06/2014   Degenerative disc disease, lumbar 09/09/2012   Spinal stenosis of lumbar region 09/09/2012    PCP: Charlies Bellini, DO  REFERRING PROVIDER: Elspeth Parker, MD  REFERRING DIAG: post-op REPAIR, TENDON, GLUTEUS MEDIUS, OPEN (Right) - RIGHT GLUTEUS MAXIMUS TENDON TRANSFER WITH COLLAGEN PATCH AUGMENTATION  Rationale for Evaluation and Treatment: Rehabilitation  THERAPY DIAG:  Muscle weakness (generalized)  Pain in right hip  Other abnormalities of gait and mobility  ONSET DATE: 07/02/23 surgery date - chronic pain and weakness prior to surgery  SUBJECTIVE:                                                                                                                                                                                           SUBJECTIVE STATEMENT: I've practicing standing on one foot.  I'm so irritated I can't walk right without a cane.  I'm going to try my friends pool and hot tub tomorrow.    PERTINENT HISTORY:  Used to play pickleball  PAIN:  PAIN:  Are you having pain? Yes NPRS scale: 0 no pain  Pain location: Rt lateral hip Pain orientation: Right  PAIN TYPE: aching Pain description: intermittent, dull, and aching  Aggravating factors: laying on Rt side, in/out of car, walking Relieving factors: sitting   PRECAUTIONS: Other: protocol from Dr. Parker scanned into media section of chart - avoid until 6 weeks post-op which is 08/13/23: passive ADDuction and ER, active ABDuction and IR  RED FLAGS: None   WEIGHT BEARING RESTRICTIONS: PWB initially, doctor just cleared to get rid of walking stick when ready  FALLS:  Has patient fallen in last 6 months? Yes. Number of falls 1, while carrying a planter and going up stairs  LIVING ENVIRONMENT: Lives with: lives with their spouse Lives in: House/apartment Stairs: Yes: External: 3 steps; on left going up Has following equipment at home: Single point cane  OCCUPATION: retired  PLOF: Independent  PATIENT GOALS: walk without pain and walking stick, can I get back to pickleball?  NEXT MD VISIT: 3 weeks  OBJECTIVE:  Note: Objective measures were completed  at Evaluation unless otherwise noted.  DIAGNOSTIC FINDINGS:  Xray (  3 views right hip): No femoral acetabular osteoarthritis   MRI (right hip): There is an end-stage full-thickness tear of the gluteus medius with significant atrophy on T1 coronal view  PATIENT SURVEYS:  LEFS: 44/80 7/16: LEFS  42/80  COGNITION: Overall cognitive status: Within functional limits for tasks assessed     SENSATION: WFL  MUSCLE LENGTH:   POSTURE: flexed trunk   PALPATION: Mild tenderness around incision - steri strips placed today after stitches were removed  LUMBAR ROM:  WFL  LOWER EXTREMITY ROM:    Pt able to perform seated march A/ROM and quadruped rocking for hip flexion beyond 90 deg without pain  LOWER EXTREMITY MMT:   Lt LE 4+/5 Rt hip at least 3+/5 - isometric testing only, resisted isometrics strong and painfree   FUNCTIONAL TESTS:  Eval: 5 times sit to stand: 13.23 Timed up and go (TUG): 12.24 using walking stick 2 MWT: 315 feet using single walking stick in Lt UE  08/01/2023: 6 minute walk test:  759 ft with [facility] 08/07/23: 6 minute walk test: 781' with [facility] 7/15: 5x STS with hands: 10.40 5x STS no hands 11.19 TUG: with cane: 9.29 sec  GAIT: Distance walked: 315' in 2 min  Assistive device utilized: single walking stick Level of assistance: Modified independence Comments: Rt early stance phase Trendelenburg  TREATMENT DATE:  09/03/23: Rocking forward and back with foot on the 2nd step At the stairs 2nd step hamstring stretch 5x right/left WB on right with left foot step taps 2 sets of 5 (needs 2-3 fingers to support on each side for stability) Leg press seat 6 75lb  2x10, single leg 35# 2x10 right/left Foam weight shifting in staggered stance forward/back 10x each Lateral step ups on foam with hip abduction 2 sets of 5 (focusing on activating glutes) Supine bridge 5x; bridge with Les on on green ball 2 sets of 5  Supine trunk rotation with green ball 10x (feels  good) Sit to stand from mat table with blue loop above knees 5x; staggered stance with right foot back 5x 6 feet side step with blue loop above knees in front of the mat table x2 with hand held assist Cable column 5# trunk rotation in standard stance10x to each side  08/29/2023: Recumbent bike L2 x 7' PT present to monitor- reviewed with pt her d Dynamic warm up at the barre: side stepping, heel raises, marching, backwards walking Y cone touches 2x 5 (fatigues quickly) Circle touches WB on right, left toe touches 2 sets of 10x (very challenging, needs 2 handed UE support) Weight shifting on foam; regular stance  and staggered stance without UE assist  Leg press seat 6 70lb  2x10, single leg 30# 2x10 bil (40# felt too difficult therefore decreased the load) Cable column 5# trunk rotation 10x to each side    08/21/2023: Recumbent bike L2 x 5' PT present to monitor- Level 3 was too much  Squat to mat + foam x20 with blue band around thigh 6 fwd and 6 lateral step ups Rt LE 2 x10 each Leg press seat 6 70lb  2x10, single leg 40# 2x10 bil Standing 4 way hip + march 2 x 10 ea Rt and Lt- stood on 2 step on Rt due to Lt LE being longer-verbal cues for Rt gluteal     08/19/2023: Recumbent bike L1 x 5' PT present to discuss stages of healing for soft tissues and protocol timeline Stool rotation with hip IR/ER  Squat to mat + foam  x 10, then without foam x 10 6 fwd and 4 lateral step ups Rt LE 2 x10 each Leg press seat 6 70lb x 20 Standing 4 way hip + march 2 x 10 ea R Supine SLR 2x10 B for core Supine bridge 2x10 WB on right with 8 inch step taps with emphasis on glute activation and level pelvis BAPS L2 wt shifting forward onto R working on level pelvis   08/14/2023: Recumbent bike L1 x 5' PT present to discuss stages of healing for soft tissues and protocol timeline 5x STS TUG LEFS Hip joint mobs: grade 2/3 prone posterior-anterior glides with rotation; supine with belt lateral and  inferior with rotation Discussion of pelvic control with gait without cane (recommend practice at home in hallway 2-3 minutes 3x/day) also suggested pool walking for gait symmetry 4 fwd and lateral step ups Rt LE x10 each Leg press seat 6 70lb x 20 WB on right with 8 inch step taps with emphasis on glute activation and level pelvis                                                                                                            PATIENT EDUCATION:  Education details: [email or web address] Person educated: Patient Education method: Explanation, Demonstration, Verbal cues, and Handouts Education comprehension: verbalized understanding and returned demonstration  HOME EXERCISE PROGRAM: Access Code: [email or web address] URL: [email or web address] Date: 07/12/2023 Prepared by: Orvil Beuhring  Exercises - Supine Posterior Pelvic Tilt  - 2 x daily - 7 x weekly - 2 sets - 10 reps - Supine Short Arc Quad  - 2 x daily - 7 x weekly - 2 sets - 10 reps - Supine Hip Adduction Isometric with Ball  - 2 x daily - 7 x weekly - 2 sets - 10 reps - 5 hold - Quadruped Rocking Backward  - 2 x daily - 7 x weekly - 1-2 sets - 10 reps - 5 hold - Seated Isometric Hip Abduction  - 2 x daily - 7 x weekly - 2 sets - 10 reps - 5 hold - Standing Knee Flexion AROM with Chair Support  - 2 x daily - 7 x weekly - 2 sets - 10 reps  ASSESSMENT:  CLINICAL IMPRESSION: Treatment focus on exercises to facilitate activation of the right gluteal muscles needed for gait without the cane especially for longer distances.  Ex's performed at lower number of repetitions to avoid excessive muscular fatigue and therefore a compensatory pelvic drop  Therapist providing verbal cues to optimize technique with exercises in order to achieve the greatest benefit.       OBJECTIVE IMPAIRMENTS: Abnormal gait, decreased balance, decreased coordination, difficulty walking, decreased strength, increased edema, impaired flexibility, improper  body mechanics, postural dysfunction, and pain.   ACTIVITY LIMITATIONS: lifting, bending, squatting, stairs, and locomotion level  PARTICIPATION LIMITATIONS: cleaning, laundry, shopping, community activity, and yard work  PERSONAL FACTORS: Age are also affecting patient's functional outcome.   REHAB POTENTIAL: Excellent  CLINICAL DECISION MAKING: Evolving/moderate complexity  EVALUATION COMPLEXITY: Moderate   GOALS: Goals reviewed  with patient? Yes  SHORT TERM GOALS: Target date: 08/09/23  Pt will be ind with initial HEP Baseline: Goal status: Met on 08/01/23  2.  Pt will be educated on movements to avoid to follow post-op protocol to protect tendon transfer site Baseline:  Goal status: MET  3.  Pt will demo improved Rt hip control in stance phase of gait with single walking stick as needed. Baseline:  Goal status: MET 08/07/23    LONG TERM GOALS: Target date: 09/06/23  Pt will be ind with advanced HEP Baseline:  Goal status: INITIAL  2.  Pt will improve LEFS score to at least 55/80 to demo improved function Baseline: 44/80 Goal status: INITIAL  3.  Pt will perform 6 min walk test covering at least 700' with walking stick as needed Baseline:  Goal status: MET 08/07/23 with walking stick  4.  Pt will be able to demo squat, lift 10lb, carry 10lb and walk to mimic household chores and yard activities with proper body mechanics without pain. Baseline:  Goal status: INITIAL  5.  Pt will improve glut strength to at least 4+/5 to improve gait mechanics, stairs and transfers. Baseline:  Goal status: INITIAL  6.  5x STS improved to 11 sec or less to demo improved functional strength and reduce fall risk. Baseline: 13.23 Goal status: INITIAL  PLAN:  PT FREQUENCY: 2x/week  PT DURATION: 8 weeks  PLANNED INTERVENTIONS: 97110-Therapeutic exercises, 97530- Therapeutic activity, [id number]- Neuromuscular re-education, 97535- Self Care, [id number]- Manual therapy, [phone]- Gait training,  Patient/Family education, Balance training, Stair training, Cryotherapy, and Moist heat.  PLAN FOR NEXT SESSION:  ERO next visit;  5x STS, LEFS; see how pool ex went on Wednesday (friend's pool);   gluteal muscle activation; hip mobilizations; Continue to work on gait endurance, follow protocol scanned into media section of chart from Dr. Genelle; patient hopes to return to minerva Glade [place], PT 09/03/23 2:07 PM Phone: [phone] Fax: [phone]  [facility] [address] [place], [place] [address] Phone # [phone] Fax [phone]

## 2023-09-05 ENCOUNTER — Ambulatory Visit: Admitting: Physical Therapy

## 2023-09-05 DIAGNOSIS — R2689 Other abnormalities of gait and mobility: Secondary | ICD-10-CM

## 2023-09-05 DIAGNOSIS — M6281 Muscle weakness (generalized): Secondary | ICD-10-CM | POA: Diagnosis not present

## 2023-09-05 DIAGNOSIS — M25551 Pain in right hip: Secondary | ICD-10-CM

## 2023-09-05 NOTE — Therapy (Signed)
 OUTPATIENT PHYSICAL THERAPY TREATMENT NOTE/RECERTIFICATION   Patient Name: Rebecca Long MRN: [id number] DOB:Nov 20, 1950, 73 y.o., female Today's Date: 09/05/2023     END OF SESSION:  PT End of Session - 09/05/23 1147     Visit Number 15    Date for PT Re-Evaluation 10/31/23    Authorization Type HTA no auth required    Progress Note Due on Visit 20    PT Start Time 1145    PT Stop Time 1229    PT Time Calculation (min) 44 min    Activity Tolerance Patient tolerated treatment well                  Past Medical History:  Diagnosis Date   Anxiety    Cavus foot, acquired 09/09/2012    bilateral cavus feet the one on the left shows that she has some probable neurogenic weakness with abnormal curvature and in inversion contracture Podiatry 06/2015: H&P and x-ray reviewed with patient. Today I went ahead and I did a proximal nerve block I was able to aspirate the second MPJ and got out amount of clear fluid and injected with a quarter cc dexamethasone  Kenalog  and ap   DDD (degenerative disc disease), lumbar    Depression    Endometrial polyp    GERD (gastroesophageal reflux disease)    Hiatal hernia    Hip flexor tendinitis, right 07/02/2016   History of adenomatous polyp of colon    History of esophageal stricture    History of gastric polyp 11/2017   History of PCR DNA positive for HSV2 11/12/2016   History of primary hyperparathyroidism    s/p  left parathryoidectomy 09-17-2008-- resolved   Hyperlipidemia    Hypertension    Insomnia    Lumbar stenosis    Osteopenia    PONV (postoperative nausea and vomiting)    Schatzki's ring    Scoliosis    Tendinopathy of right biceps tendon 07/02/2016   Thickened endometrium    Uterine fibroid    UTI (urinary tract infection) due to Enterococcus 01/05/2020   Wears glasses    Past Surgical History:  Procedure Laterality Date   BREAST REDUCTION SURGERY Bilateral 2000   COLONOSCOPY     CYST EXCISION  1985   head and rt  axilla   DILATATION & CURETTAGE/HYSTEROSCOPY WITH MYOSURE N/A 08/13/2018   Procedure: DILATATION & CURETTAGE/HYSTEROSCOPY WITH MYOSURE;  Surgeon: Lavoie, Marie-Lyne, MD;  Location: [facility];  Service: Gynecology;  Laterality: N/A;  requests 10:15am in [place] Gyn block request 30 minutes   DILATION AND CURETTAGE OF UTERUS N/A 08/13/2018   ECTOPIC PREGNANCY SURGERY  1981   OPEN SURGICAL REPAIR OF GLUTEAL TENDON Right 07/02/2023   Procedure: REPAIR, TENDON, GLUTEUS MEDIUS, OPEN;  Surgeon: Genelle Standing, MD;  Location: [facility];  Service: Orthopedics;  Laterality: Right;  RIGHT GLUTEUS MAXIMUS TENDON TRANSFER WITH COLLAGEN PATCH AUGMENTATION   PARATHYROIDECTOMY Left 09/17/2008   dr jeoffrey dawn  @[facility]    left superior  (adenoma)   REDUCTION MAMMAPLASTY Bilateral 2000   years ago   SHOULDER SURGERY Right 2007   frozen   UPPER GASTROINTESTINAL ENDOSCOPY  last one 12-18-2017   dr wilhelmenia   Patient Active Problem List   Diagnosis Date Noted   Tendinopathy of gluteus medius 07/02/2023   Greater trochanteric bursitis of right hip 04/17/2022   C. difficile diarrhea 03/08/2022   Arthritis of right sacroiliac joint (HCC) 02/26/2022   Encounter for monitoring long-term proton pump inhibitor therapy  07/05/2021   Hx of colonic polyps 06/06/2021   Incontinence of feces with fecal urgency 06/06/2021   Sleep disorder 11/29/2020   Benign paroxysmal positional vertigo of right ear 08/30/2020   AC (acromioclavicular) arthritis 10/22/2019   Adhesive bursitis of left shoulder 07/22/2019   GERD with esophagitis 12/30/2017   Hyperlipidemia 05/15/2017   Morbid obesity (HCC) 11/12/2016   Essential hypertension, benign 10/06/2014   Depression with anxiety 10/06/2014   Stress incontinence 10/06/2014   Osteoporosis 10/06/2014   Degenerative disc disease, lumbar 09/09/2012   Spinal stenosis of lumbar region 09/09/2012    PCP: Charlies Bellini, DO  REFERRING PROVIDER:  Elspeth Parker, MD  REFERRING DIAG: post-op REPAIR, TENDON, GLUTEUS MEDIUS, OPEN (Right) - RIGHT GLUTEUS MAXIMUS TENDON TRANSFER WITH COLLAGEN PATCH AUGMENTATION  Rationale for Evaluation and Treatment: Rehabilitation  THERAPY DIAG:  Muscle weakness (generalized)  Pain in right hip  Other abnormalities of gait and mobility  ONSET DATE: 07/02/23 surgery date - chronic pain and weakness prior to surgery  SUBJECTIVE:                                                                                                                                                                                           SUBJECTIVE STATEMENT: Unable to use my friend's pool yesterday (rainy and cold). I can't carry anything b/c that's a problem.  I feel like I'm going to trip.  I have to carry grocery bags 3 steps to get in the house. Long distance walking even with the cane, It gets tired.   PERTINENT HISTORY:  Used to play pickleball  PAIN:  PAIN:  Are you having pain? Yes NPRS scale: 0 no pain  Pain location: Rt lateral hip Pain orientation: Right  PAIN TYPE: aching Pain description: intermittent, dull, and aching  Aggravating factors: laying on Rt side, in/out of car, walking Relieving factors: sitting   PRECAUTIONS: Other: protocol from Dr. Parker scanned into media section of chart - avoid until 6 weeks post-op which is 08/13/23: passive ADDuction and ER, active ABDuction and IR  RED FLAGS: None   WEIGHT BEARING RESTRICTIONS: PWB initially, doctor just cleared to get rid of walking stick when ready  FALLS:  Has patient fallen in last 6 months? Yes. Number of falls 1, while carrying a planter and going up stairs  LIVING ENVIRONMENT: Lives with: lives with their spouse Lives in: House/apartment Stairs: Yes: External: 3 steps; on left going up Has following equipment at home: Single point cane  OCCUPATION: retired  PLOF: Independent  PATIENT GOALS: walk without pain and walking stick,  can I get back to pickleball?  NEXT MD VISIT:  3 weeks  OBJECTIVE:  Note: Objective measures were completed at Evaluation unless otherwise noted.  DIAGNOSTIC FINDINGS:  Xray (3 views right hip): No femoral acetabular osteoarthritis   MRI (right hip): There is an end-stage full-thickness tear of the gluteus medius with significant atrophy on T1 coronal view  PATIENT SURVEYS:  LEFS: 44/80 7/16: LEFS  42/80  COGNITION: Overall cognitive status: Within functional limits for tasks assessed     SENSATION: WFL  MUSCLE LENGTH:   POSTURE: flexed trunk   PALPATION: Mild tenderness around incision - steri strips placed today after stitches were removed  LUMBAR ROM:  WFL  LOWER EXTREMITY ROM:    Pt able to perform seated march A/ROM and quadruped rocking for hip flexion beyond 90 deg without pain  LOWER EXTREMITY MMT:   Lt LE 4+/5 Rt hip at least 3+/5 - isometric testing only, resisted isometrics strong and painfree   FUNCTIONAL TESTS:  Eval: 5 times sit to stand: 13.23 Timed up and go (TUG): 12.24 using walking stick 2 MWT: 315 feet using single walking stick in Lt UE  08/01/2023: 6 minute walk test:  759 ft with [facility] 08/07/23: 6 minute walk test: 781' with [facility] 7/15: 5x STS with hands: 10.40 5x STS no hands 11.19 TUG: with cane: 9.29 sec  09/05/23: 6 MWT 1010 feet with SPC 5x STS no hands 9.94   GAIT: Distance walked: 315' in 2 min  Assistive device utilized: single walking stick Level of assistance: Modified independence Comments: Rt early stance phase Trendelenburg  TREATMENT DATE:  09/05/23: Nu-Step L1 5 min low spms for warm up 6 MWT 5x STS as above Bil heel raises 10x 4 inch forward step up 10x 4 inch lateral step up 10x WB on right with left foot foam roll on floor back and forth 10x (no UE support needed) WB on right with left foot on small ball circles 2 sets of 5 (needed mod UE support) Leg press seat 6 75lb  2x10, single leg 35# 2x10  right/left Cable column 5# trunk rotation in staggered stance10x to each side Supine bridge 5x; single leg bridge 5x right/left  09/03/23: Rocking forward and back with foot on the 2nd step At the stairs 2nd step hamstring stretch 5x right/left WB on right with left foot step taps 2 sets of 5 (needs 2-3 fingers to support on each side for stability) Leg press seat 6 75lb  2x10, single leg 35# 2x10 right/left Foam weight shifting in staggered stance forward/back 10x each Lateral step ups on foam with hip abduction 2 sets of 5 (focusing on activating glutes) Supine bridge 5x; bridge with Les on on green ball 2 sets of 5  Supine trunk rotation with green ball 10x (feels good) Sit to stand from mat table with blue loop above knees 5x; staggered stance with right foot back 5x 6 feet side step with blue loop above knees in front of the mat table x2 with hand held assist Cable column 5# trunk rotation in standard stance10x to each side  08/29/2023: Recumbent bike L2 x 7' PT present to monitor- reviewed with pt her d Dynamic warm up at the barre: side stepping, heel raises, marching, backwards walking Y cone touches 2x 5 (fatigues quickly) Circle touches WB on right, left toe touches 2 sets of 10x (very challenging, needs 2 handed UE support) Weight shifting on foam; regular stance  and staggered stance without UE assist  Leg press seat 6 70lb  2x10, single leg 30# 2x10 bil (  40# felt too difficult therefore decreased the load) Cable column 5# trunk rotation 10x to each side    08/21/2023: Recumbent bike L2 x 5' PT present to monitor- Level 3 was too much  Squat to mat + foam x20 with blue band around thigh 6 fwd and 6 lateral step ups Rt LE 2 x10 each Leg press seat 6 70lb  2x10, single leg 40# 2x10 bil Standing 4 way hip + march 2 x 10 ea Rt and Lt- stood on 2 step on Rt due to Lt LE being longer-verbal cues for Rt gluteal     08/19/2023: Recumbent bike L1 x 5' PT present to discuss  stages of healing for soft tissues and protocol timeline Stool rotation with hip IR/ER  Squat to mat + foam x 10, then without foam x 10 6 fwd and 4 lateral step ups Rt LE 2 x10 each Leg press seat 6 70lb x 20 Standing 4 way hip + march 2 x 10 ea R Supine SLR 2x10 B for core Supine bridge 2x10 WB on right with 8 inch step taps with emphasis on glute activation and level pelvis BAPS L2 wt shifting forward onto R working on level pelvis                                                                                                            PATIENT EDUCATION:  Education details: [email or web address] Person educated: Patient Education method: Programmer, multimedia, Demonstration, Verbal cues, and Handouts Education comprehension: verbalized understanding and returned demonstration  HOME EXERCISE PROGRAM: Access Code: [email or web address] URL: [email or web address] Date: 07/12/2023 Prepared by: Orvil Beuhring  Exercises - Supine Posterior Pelvic Tilt  - 2 x daily - 7 x weekly - 2 sets - 10 reps - Supine Short Arc Quad  - 2 x daily - 7 x weekly - 2 sets - 10 reps - Supine Hip Adduction Isometric with Ball  - 2 x daily - 7 x weekly - 2 sets - 10 reps - 5 hold - Quadruped Rocking Backward  - 2 x daily - 7 x weekly - 1-2 sets - 10 reps - 5 hold - Seated Isometric Hip Abduction  - 2 x daily - 7 x weekly - 2 sets - 10 reps - 5 hold - Standing Knee Flexion AROM with Chair Support  - 2 x daily - 7 x weekly - 2 sets - 10 reps  ASSESSMENT:  CLINICAL IMPRESSION: Much improved gait speed and endurance since start of care.  She continues to use a cane for community distances secondary to pelvic drop/Trendelenberg gait without assistive device.  Treatment focus has been on pelvic control, hip mobility, single leg balance/proprioception and activation of gluteal muscles.  The patient would benefit from a continuation of skilled PT for a further progression of strengthening and functional mobility.  Will continue to  update and promote independence in a HEP needed for a return to the highest functional level possible with ADLs.       OBJECTIVE IMPAIRMENTS: Abnormal gait, decreased balance, decreased coordination, difficulty walking,  decreased strength, increased edema, impaired flexibility, improper body mechanics, postural dysfunction, and pain.   ACTIVITY LIMITATIONS: lifting, bending, squatting, stairs, and locomotion level  PARTICIPATION LIMITATIONS: cleaning, laundry, shopping, community activity, and yard work  PERSONAL FACTORS: Age are also affecting patient's functional outcome.   REHAB POTENTIAL: Excellent  CLINICAL DECISION MAKING: Evolving/moderate complexity  EVALUATION COMPLEXITY: Moderate   GOALS: Goals reviewed with patient? Yes  SHORT TERM GOALS: Target date: 08/09/23  Pt will be ind with initial HEP Baseline: Goal status: Met on 08/01/23  2.  Pt will be educated on movements to avoid to follow post-op protocol to protect tendon transfer site Baseline:  Goal status: MET  3.  Pt will demo improved Rt hip control in stance phase of gait with single walking stick as needed. Baseline:  Goal status: MET 08/07/23    LONG TERM GOALS: Target date: 10/31/2023   Pt will be ind with advanced HEP Baseline:  Goal status: ongoing  2.  Pt will improve LEFS score to at least 55/80 to demo improved function Baseline: 44/80 Goal status: ongoing  3.  Pt will perform 6 min walk test covering at least 700' with walking stick as needed Baseline:  Goal status: MET 08/07/23 with walking stick  4.  Pt will be able to demo squat, lift 10lb, carry 10lb and walk to mimic household chores and yard activities with proper body mechanics without pain. Baseline:  Goal status: ongoing  5.  Pt will improve glut strength to at least 4+/5 to improve gait mechanics, stairs and transfers. Baseline:  Goal status: INITIAL  6.  5x STS improved to 11 sec or less to demo improved functional strength and  reduce fall risk. Baseline: 13.23 Goal status: ongoing  PLAN:  PT FREQUENCY: 2x/week  PT DURATION: 8 weeks  PLANNED INTERVENTIONS: 97110-Therapeutic exercises, 97530- Therapeutic activity, [id number]- Neuromuscular re-education, 97535- Self Care, [id number]- Manual therapy, [phone]- Gait training, Patient/Family education, Balance training, Stair training, Cryotherapy, and Moist heat.  PLAN FOR NEXT SESSION:  next week at 10 weeks post-op: add hip machine, unilateral cable column rotations, step downs, side stepping with band;    gluteal muscle activation; hip mobilizations; Continue to work on gait endurance, follow protocol scanned into media section of chart from Dr. Genelle;     Glade Pesa, PT 09/05/23 5:06 PM Phone: [phone] Fax: [phone]  [facility] [address], [address] [place], [place] [address] Phone # [phone] Fax [phone]

## 2023-09-10 ENCOUNTER — Ambulatory Visit

## 2023-09-10 DIAGNOSIS — R252 Cramp and spasm: Secondary | ICD-10-CM

## 2023-09-10 DIAGNOSIS — M25551 Pain in right hip: Secondary | ICD-10-CM

## 2023-09-10 DIAGNOSIS — R2689 Other abnormalities of gait and mobility: Secondary | ICD-10-CM

## 2023-09-10 DIAGNOSIS — M6281 Muscle weakness (generalized): Secondary | ICD-10-CM

## 2023-09-10 DIAGNOSIS — R262 Difficulty in walking, not elsewhere classified: Secondary | ICD-10-CM

## 2023-09-10 DIAGNOSIS — R293 Abnormal posture: Secondary | ICD-10-CM

## 2023-09-10 NOTE — Therapy (Signed)
 OUTPATIENT PHYSICAL THERAPY TREATMENT    Patient Name: Rebecca Long MRN: [id number] DOB:01/11/1951, 73 y.o., female Today's Date: 09/10/2023     END OF SESSION:  PT End of Session - 09/10/23 1234     Visit Number 16    Date for PT Re-Evaluation 10/31/23    Authorization Type HTA no auth required    Progress Note Due on Visit 20    PT Start Time 1145    PT Stop Time 1230    PT Time Calculation (min) 45 min    Activity Tolerance Patient tolerated treatment well    Behavior During Therapy [facility] for tasks assessed/performed           Past Medical History:  Diagnosis Date   Anxiety    Cavus foot, acquired 09/09/2012    bilateral cavus feet the one on the left shows that she has some probable neurogenic weakness with abnormal curvature and in inversion contracture Podiatry 06/2015: H&P and x-ray reviewed with patient. Today I went ahead and I did a proximal nerve block I was able to aspirate the second MPJ and got out amount of clear fluid and injected with a quarter cc dexamethasone  Kenalog  and ap   DDD (degenerative disc disease), lumbar    Depression    Endometrial polyp    GERD (gastroesophageal reflux disease)    Hiatal hernia    Hip flexor tendinitis, right 07/02/2016   History of adenomatous polyp of colon    History of esophageal stricture    History of gastric polyp 11/2017   History of PCR DNA positive for HSV2 11/12/2016   History of primary hyperparathyroidism    s/p  left parathryoidectomy 09-17-2008-- resolved   Hyperlipidemia    Hypertension    Insomnia    Lumbar stenosis    Osteopenia    PONV (postoperative nausea and vomiting)    Schatzki's ring    Scoliosis    Tendinopathy of right biceps tendon 07/02/2016   Thickened endometrium    Uterine fibroid    UTI (urinary tract infection) due to Enterococcus 01/05/2020   Wears glasses    Past Surgical History:  Procedure Laterality Date   BREAST REDUCTION SURGERY Bilateral 2000   COLONOSCOPY     CYST  EXCISION  1985   head and rt axilla   DILATATION & CURETTAGE/HYSTEROSCOPY WITH MYOSURE N/A 08/13/2018   Procedure: DILATATION & CURETTAGE/HYSTEROSCOPY WITH MYOSURE;  Surgeon: Lavoie, Marie-Lyne, MD;  Location: [facility];  Service: Gynecology;  Laterality: N/A;  requests 10:15am in [place] Gyn block request 30 minutes   DILATION AND CURETTAGE OF UTERUS N/A 08/13/2018   ECTOPIC PREGNANCY SURGERY  1981   OPEN SURGICAL REPAIR OF GLUTEAL TENDON Right 07/02/2023   Procedure: REPAIR, TENDON, GLUTEUS MEDIUS, OPEN;  Surgeon: Genelle Standing, MD;  Location: [facility];  Service: Orthopedics;  Laterality: Right;  RIGHT GLUTEUS MAXIMUS TENDON TRANSFER WITH COLLAGEN PATCH AUGMENTATION   PARATHYROIDECTOMY Left 09/17/2008   dr jeoffrey dawn  @[facility]    left superior  (adenoma)   REDUCTION MAMMAPLASTY Bilateral 2000   years ago   SHOULDER SURGERY Right 2007   frozen   UPPER GASTROINTESTINAL ENDOSCOPY  last one 12-18-2017   dr wilhelmenia   Patient Active Problem List   Diagnosis Date Noted   Tendinopathy of gluteus medius 07/02/2023   Greater trochanteric bursitis of right hip 04/17/2022   C. difficile diarrhea 03/08/2022   Arthritis of right sacroiliac joint (HCC) 02/26/2022   Encounter for monitoring long-term proton  pump inhibitor therapy 07/05/2021   Hx of colonic polyps 06/06/2021   Incontinence of feces with fecal urgency 06/06/2021   Sleep disorder 11/29/2020   Benign paroxysmal positional vertigo of right ear 08/30/2020   AC (acromioclavicular) arthritis 10/22/2019   Adhesive bursitis of left shoulder 07/22/2019   GERD with esophagitis 12/30/2017   Hyperlipidemia 05/15/2017   Morbid obesity (HCC) 11/12/2016   Essential hypertension, benign 10/06/2014   Depression with anxiety 10/06/2014   Stress incontinence 10/06/2014   Osteoporosis 10/06/2014   Degenerative disc disease, lumbar 09/09/2012   Spinal stenosis of lumbar region 09/09/2012    PCP: Charlies Bellini,  DO  REFERRING PROVIDER: Elspeth Parker, MD  REFERRING DIAG: post-op REPAIR, TENDON, GLUTEUS MEDIUS, OPEN (Right) - RIGHT GLUTEUS MAXIMUS TENDON TRANSFER WITH COLLAGEN PATCH AUGMENTATION  Rationale for Evaluation and Treatment: Rehabilitation  THERAPY DIAG:  Muscle weakness (generalized)  Difficulty in walking, not elsewhere classified  Pain in right hip  Cramp and spasm  Other abnormalities of gait and mobility  Abnormal posture  ONSET DATE: 07/02/23 surgery date - chronic pain and weakness prior to surgery  SUBJECTIVE:                                                                                                                                                                                           SUBJECTIVE STATEMENT: Pt states she didn't do much this past week. However she reports she did get lots of sleep and was able to rest.    Used to play pickleball  PAIN:  PAIN:  Are you having pain? Yes NPRS scale: 0 no pain  Pain location: Rt lateral hip Pain orientation: Right  PAIN TYPE: aching Pain description: intermittent, dull, and aching  Aggravating factors: laying on Rt side, in/out of car, walking Relieving factors: sitting   PRECAUTIONS: Other: protocol from Dr. Parker scanned into media section of chart - avoid until 6 weeks post-op which is 08/13/23: passive ADDuction and ER, active ABDuction and IR  RED FLAGS: None   WEIGHT BEARING RESTRICTIONS: PWB initially, doctor just cleared to get rid of walking stick when ready  FALLS:  Has patient fallen in last 6 months? Yes. Number of falls 1, while carrying a planter and going up stairs  LIVING ENVIRONMENT: Lives with: lives with their spouse Lives in: House/apartment Stairs: Yes: External: 3 steps; on left going up Has following equipment at home: Single point cane  OCCUPATION: retired  PLOF: Independent  PATIENT GOALS: walk without pain and walking stick, can I get back to pickleball?  NEXT MD  VISIT: 3 weeks  OBJECTIVE:  Note: Objective measures were completed at Evaluation  unless otherwise noted.  DIAGNOSTIC FINDINGS:  Xray (3 views right hip): No femoral acetabular osteoarthritis   MRI (right hip): There is an end-stage full-thickness tear of the gluteus medius with significant atrophy on T1 coronal view  PATIENT SURVEYS:  LEFS: 44/80 7/16: LEFS  42/80  COGNITION: Overall cognitive status: Within functional limits for tasks assessed     SENSATION: WFL  MUSCLE LENGTH:   POSTURE: flexed trunk   PALPATION: Mild tenderness around incision - steri strips placed today after stitches were removed  LUMBAR ROM:  WFL  LOWER EXTREMITY ROM:    Pt able to perform seated march A/ROM and quadruped rocking for hip flexion beyond 90 deg without pain  LOWER EXTREMITY MMT:   Lt LE 4+/5 Rt hip at least 3+/5 - isometric testing only, resisted isometrics strong and painfree   FUNCTIONAL TESTS:  Eval: 5 times sit to stand: 13.23 Timed up and go (TUG): 12.24 using walking stick 2 MWT: 315 feet using single walking stick in Lt UE  08/01/2023: 6 minute walk test:  759 ft with [facility] 08/07/23: 6 minute walk test: 781' with [facility] 7/15: 5x STS with hands: 10.40 5x STS no hands 11.19 TUG: with cane: 9.29 sec  09/05/23: 6 MWT 1010 feet with SPC 5x STS no hands 9.94   GAIT: Distance walked: 315' in 2 min  Assistive device utilized: single walking stick Level of assistance: Modified independence Comments: Rt early stance phase Trendelenburg  TREATMENT DATE:   09/10/23 Nu-Step L1 5 min - PT student monitored and discussed pt status Bil heel raises 10x Bil heel raises with ball between ankles 2x10- for Post tib strengthening Supine posterior pelvic tilt 2x10-  neuromuscular reeducation to promote lumbopelvic stability  Supine hip ER fall outs 2x10 with red loop  (increase resistance next visit) Matrix abd right leg moving leg 35# 2x10-this was difficult Matrix extension  bilaterally 35# 2x10- this was challenging  4 inch forward step up 10x  4 inch lateral step up 10x-lateral was easier for her than front/back Leg press seat 6 75lb  2x10, single leg 35# 2x10 right/left Open books in side lying x10 each side- she liked this  09/05/23: Nu-Step L1 5 min low spms for warm up 6 MWT 5x STS as above Bil heel raises 10x 4 inch forward step up 10x 4 inch lateral step up 10x WB on right with left foot foam roll on floor back and forth 10x (no UE support needed) WB on right with left foot on small ball circles 2 sets of 5 (needed mod UE support) Leg press seat 6 75lb  2x10, single leg 35# 2x10 right/left Cable column 5# trunk rotation in staggered stance10x to each side Supine bridge 5x; single leg bridge 5x right/left Manual: soft tissue elongation to Rt glute med  09/03/23: Rocking forward and back with foot on the 2nd step At the stairs 2nd step hamstring stretch 5x right/left WB on right with left foot step taps 2 sets of 5 (needs 2-3 fingers to support on each side for stability) Leg press seat 6 75lb  2x10, single leg 35# 2x10 right/left Foam weight shifting in staggered stance forward/back 10x each Lateral step ups on foam with hip abduction 2 sets of 5 (focusing on activating glutes) Supine bridge 5x; bridge with Les on on green ball 2 sets of 5  Supine trunk rotation with green ball 10x (feels good) Sit to stand from mat table with blue loop above knees 5x; staggered stance with right  foot back 5x 6 feet side step with blue loop above knees in front of the mat table x2 with hand held assist Cable column 5# trunk rotation in standard stance10x to each side  08/29/2023: Recumbent bike L2 x 7' PT present to monitor- reviewed with pt her d Dynamic warm up at the barre: side stepping, heel raises, marching, backwards walking Y cone touches 2x 5 (fatigues quickly) Circle touches WB on right, left toe touches 2 sets of 10x (very challenging, needs 2 handed UE  support) Weight shifting on foam; regular stance  and staggered stance without UE assist  Leg press seat 6 70lb  2x10, single leg 30# 2x10 bil (40# felt too difficult therefore decreased the load) Cable column 5# trunk rotation 10x to each side    08/21/2023: Recumbent bike L2 x 5' PT present to monitor- Level 3 was too much  Squat to mat + foam x20 with blue band around thigh 6 fwd and 6 lateral step ups Rt LE 2 x10 each Leg press seat 6 70lb  2x10, single leg 40# 2x10 bil Standing 4 way hip + march 2 x 10 ea Rt and Lt- stood on 2 step on Rt due to Lt LE being longer-verbal cues for Rt gluteal                                                                                                              PATIENT EDUCATION:  Education details: [email or web address] Person educated: Patient Education method: Explanation, Demonstration, Verbal cues, and Handouts Education comprehension: verbalized understanding and returned demonstration  HOME EXERCISE PROGRAM: Access Code: [email or web address] URL: [email or web address] Date: 07/12/2023 Prepared by: Orvil Beuhring  Exercises - Supine Posterior Pelvic Tilt  - 2 x daily - 7 x weekly - 2 sets - 10 reps - Supine Short Arc Quad  - 2 x daily - 7 x weekly - 2 sets - 10 reps - Supine Hip Adduction Isometric with Ball  - 2 x daily - 7 x weekly - 2 sets - 10 reps - 5 hold - Quadruped Rocking Backward  - 2 x daily - 7 x weekly - 1-2 sets - 10 reps - 5 hold - Seated Isometric Hip Abduction  - 2 x daily - 7 x weekly - 2 sets - 10 reps - 5 hold - Standing Knee Flexion AROM with Chair Support  - 2 x daily - 7 x weekly - 2 sets - 10 reps  ASSESSMENT:  CLINICAL IMPRESSION:   Ms Kofoed presents to skilled PT with no Right leg pain, and states she can tell she is improving since her surgery. She continues using a cane for community distances secondary to pelvic drop/Trendelenburg gait. Today's treatment focused on hip muscle strengthening, hip/lumbo pelvic  stability, and neuromuscular reeducation of core/hip muscle activation. She showed most difficulty with Matrix machine abduction when Right leg (involved) was in standing. She also had difficulty with performing step ups on 4 in step, most likely due to decreased hip stability. Patient would benefit from a continuation of  skilled PT for a further progression of strengthening and functional mobility.    OBJECTIVE IMPAIRMENTS: Abnormal gait, decreased balance, decreased coordination, difficulty walking, decreased strength, increased edema, impaired flexibility, improper body mechanics, postural dysfunction, and pain.   ACTIVITY LIMITATIONS: lifting, bending, squatting, stairs, and locomotion level  PARTICIPATION LIMITATIONS: cleaning, laundry, shopping, community activity, and yard work  PERSONAL FACTORS: Age are also affecting patient's functional outcome.   REHAB POTENTIAL: Excellent  CLINICAL DECISION MAKING: Evolving/moderate complexity  EVALUATION COMPLEXITY: Moderate   GOALS: Goals reviewed with patient? Yes  SHORT TERM GOALS: Target date: 08/09/23  Pt will be ind with initial HEP Baseline: Goal status: Met on 08/01/23  2.  Pt will be educated on movements to avoid to follow post-op protocol to protect tendon transfer site Baseline:  Goal status: MET  3.  Pt will demo improved Rt hip control in stance phase of gait with single walking stick as needed. Baseline:  Goal status: MET 08/07/23    LONG TERM GOALS: Target date: 10/31/2023   Pt will be ind with advanced HEP Baseline:  Goal status: ongoing  2.  Pt will improve LEFS score to at least 55/80 to demo improved function Baseline: 44/80 Goal status: ongoing  3.  Pt will perform 6 min walk test covering at least 700' with walking stick as needed Baseline:  Goal status: MET 08/07/23 with walking stick  4.  Pt will be able to demo squat, lift 10lb, carry 10lb and walk to mimic household chores and yard activities with  proper body mechanics without pain. Baseline:  Goal status: ongoing  5.  Pt will improve glut strength to at least 4+/5 to improve gait mechanics, stairs and transfers. Baseline:  Goal status: Progressing   6.  5x STS improved to 11 sec or less to demo improved functional strength and reduce fall risk. Baseline: 13.23 Goal status: ongoing  PLAN:  PT FREQUENCY: 2x/week  PT DURATION: 8 weeks  PLANNED INTERVENTIONS: 97110-Therapeutic exercises, 97530- Therapeutic activity, [id number]- Neuromuscular re-education, 97535- Self Care, [id number]- Manual therapy, [phone]- Gait training, Patient/Family education, Balance training, Stair training, Cryotherapy, and Moist heat.  PLAN FOR NEXT SESSION:  , unilateral cable column rotations, step downs, side stepping with band;    gluteal muscle activation; hip mobilizations; Continue to work on gait endurance, Hip stability exercises, weight bearing    Lavanda Cleverly, SPT 09/10/23 1:09 PM I agree with the following treatment note after reviewing documentation. This session was performed under the supervision of a licensed clinician. Burnard Joy, PT 09/10/23 1:09 PM   [facility] [address] [place], [place] [address] Phone # [phone] Fax [phone]

## 2023-09-11 ENCOUNTER — Ambulatory Visit (HOSPITAL_BASED_OUTPATIENT_CLINIC_OR_DEPARTMENT_OTHER): Admitting: Orthopaedic Surgery

## 2023-09-11 DIAGNOSIS — S76011A Strain of muscle, fascia and tendon of right hip, initial encounter: Secondary | ICD-10-CM

## 2023-09-11 NOTE — Progress Notes (Signed)
 Post Operative Evaluation    Procedure/Date of Surgery: Right hip gluteus maximus tendon transfer 6/3  Interval History:    Presents today status post above procedure.  She is continuing to slowly improve.  She is making continuous persistent strength improvements with physical therapy.  She is still walking with a cane.  She is still having some soreness about the lateral hip   PMH/PSH/Family History/Social History/Meds/Allergies:    Past Medical History:  Diagnosis Date   Anxiety    Cavus foot, acquired 09/09/2012    bilateral cavus feet the one on the left shows that she has some probable neurogenic weakness with abnormal curvature and in inversion contracture Podiatry 06/2015: H&P and x-ray reviewed with patient. Today I went ahead and I did a proximal nerve block I was able to aspirate the second MPJ and got out amount of clear fluid and injected with a quarter cc dexamethasone  Kenalog  and ap   DDD (degenerative disc disease), lumbar    Depression    Endometrial polyp    GERD (gastroesophageal reflux disease)    Hiatal hernia    Hip flexor tendinitis, right 07/02/2016   History of adenomatous polyp of colon    History of esophageal stricture    History of gastric polyp 11/2017   History of PCR DNA positive for HSV2 11/12/2016   History of primary hyperparathyroidism    s/p  left parathryoidectomy 09-17-2008-- resolved   Hyperlipidemia    Hypertension    Insomnia    Lumbar stenosis    Osteopenia    PONV (postoperative nausea and vomiting)    Schatzki's ring    Scoliosis    Tendinopathy of right biceps tendon 07/02/2016   Thickened endometrium    Uterine fibroid    UTI (urinary tract infection) due to Enterococcus 01/05/2020   Wears glasses    Past Surgical History:  Procedure Laterality Date   BREAST REDUCTION SURGERY Bilateral 2000   COLONOSCOPY     CYST EXCISION  1985   head and rt axilla   DILATATION & CURETTAGE/HYSTEROSCOPY  WITH MYOSURE N/A 08/13/2018   Procedure: DILATATION & CURETTAGE/HYSTEROSCOPY WITH MYOSURE;  Surgeon: Lavoie, Marie-Lyne, MD;  Location:  [facility];  Service: Gynecology;  Laterality: N/A;  requests 10:15am in [place] Gyn block request 30 minutes   DILATION AND CURETTAGE OF UTERUS N/A 08/13/2018   ECTOPIC PREGNANCY SURGERY  1981   OPEN SURGICAL REPAIR OF GLUTEAL TENDON Right 07/02/2023   Procedure: REPAIR, TENDON, GLUTEUS MEDIUS, OPEN;  Surgeon: Genelle Standing, MD;  Location: [facility];  Service: Orthopedics;  Laterality: Right;  RIGHT GLUTEUS MAXIMUS TENDON TRANSFER WITH COLLAGEN PATCH AUGMENTATION   PARATHYROIDECTOMY Left 09/17/2008   dr jeoffrey dawn  @[facility]    left superior  (adenoma)   REDUCTION MAMMAPLASTY Bilateral 2000   years ago   SHOULDER SURGERY Right 2007   frozen   UPPER GASTROINTESTINAL ENDOSCOPY  last one 12-18-2017   dr wilhelmenia   Social History   Socioeconomic History   Marital status: Married    Spouse name: Not on file   Number of children: 1   Years of education: Not on file   Highest education level: Not on file  Occupational History   Not on file  Tobacco Use   Smoking status: Former    Current packs/day: 0.00  Types: Cigarettes    Start date: 01/30/1976    Quit date: 01/30/1991    Years since quitting: 32.6   Smokeless tobacco: Never  Vaping Use   Vaping status: Never Used  Substance and Sexual Activity   Alcohol use: Not Currently   Drug use: Never   Sexual activity: Not Currently    Birth control/protection: Post-menopausal    Comment: 1st intercourse- 17, partners- 10, married- 41 yrs   Other Topics Concern   Not on file  Social History Narrative   Lives with husband. Feels safe at home. 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 [employer]  Owner. Some college. Former smoker. Wears seat belt.    Social Drivers of Corporate investment banker Strain: Low Risk  (04/10/2023)   Overall Financial Resource Strain (CARDIA)     Difficulty of Paying Living Expenses: Not hard at all  Food Insecurity: No Food Insecurity (04/10/2023)   Hunger Vital Sign    Worried About Running Out of Food in the Last Year: Never true    Ran Out of Food in the Last Year: Never true  Transportation Needs: No Transportation Needs (04/10/2023)   PRAPARE - Administrator, Civil Service (Medical): No    Lack of Transportation (Non-Medical): No  Physical Activity: Inactive (04/10/2023)   Exercise Vital Sign    Days of Exercise per Week: 0 days    Minutes of Exercise per Session: 0 min  Stress: No Stress Concern Present (04/10/2023)   [employer] of Occupational Health - Occupational Stress Questionnaire    Feeling of Stress : Not at all  Social Connections: Moderately Integrated (04/10/2023)   Social Connection and Isolation Panel    Frequency of Communication with Friends and Family: More than three times a week    Frequency of Social Gatherings with Friends and Family: More than three times a week    Attends Religious Services: Never    Database administrator or Organizations: Yes    Attends Engineer, structural: 1 to 4 times per year    Marital Status: Married   Family History  Problem Relation Age of Onset   Breast cancer Mother 67   Heart disease Father 79   Hypertension Sister    Breast cancer Maternal Aunt    Breast cancer Cousin 60   Hypertension Brother    Colon cancer Neg Hx    Esophageal cancer Neg Hx    Stomach cancer Neg Hx    Rectal cancer Neg Hx    Colon polyps Neg Hx    Allergies  Allergen Reactions   Latex Itching and Rash   Polysporin [Bacitracin-Polymyxin B] Rash   Current Outpatient Medications  Medication Sig Dispense Refill   aspirin  EC 325 MG tablet Take 1 tablet (325 mg total) by mouth daily. 14 tablet 0   atorvastatin  (LIPITOR) 20 MG tablet Take 1 tablet (20 mg total) by mouth daily. 90 tablet 3   Calcium  Carbonate-Vit D-Min (CALCIUM  1200 PO) Take by mouth.      Cholecalciferol (VITAMIN D3) 10 MCG (400 UNIT) CAPS SMARTSIG:1 Capsule(s) By Mouth     FLUoxetine  (PROZAC ) 40 MG capsule Take 1 capsule (40 mg total) by mouth daily. 90 capsule 3   folic acid  (FOLVITE ) 1 MG tablet Take 1 tablet (1 mg total) by mouth daily. 90 tablet 3   omeprazole  (PRILOSEC) 40 MG capsule Take 1 capsule (40 mg total) by mouth daily. 90 capsule 3   oxyCODONE  (ROXICODONE ) 5 MG immediate release tablet  Take 1 tablet (5 mg total) by mouth every 4 (four) hours as needed for severe pain (pain score 7-10) or breakthrough pain. 15 tablet 0   telmisartan  (MICARDIS ) 80 MG tablet Take 1 tablet (80 mg total) by mouth daily. 90 tablet 1   valACYclovir  (VALTREX ) 500 MG tablet Take 500 mg by mouth as needed.      No current facility-administered medications for this visit.   No results found.  Review of Systems:   A ROS was performed including pertinent positives and negatives as documented in the HPI.   Musculoskeletal Exam:    There were no vitals taken for this visit.  Right hip incision is well-appearing without erythema or drainage.  Distal neurosensory exam is intact.  Internal/external rotation of the right hip without pain.  There is Trendelenburg gait  Imaging:      I personally reviewed and interpreted the radiographs.   Assessment:   12-week status post right hip gluteus maximus tendon transfer.  At this time I did describe that she is making slow and steady improvements with regard to the hip.  I do believe that she will continue to make these improvements long-term.  I will plan to see her back in 12 weeks for reassessment.  She will continue physical therapy at this time  Plan :    - Return to clinic 12 weeks for reassessment      I personally saw and evaluated the patient, and participated in the management and treatment plan.  Elspeth Parker, MD Attending Physician, Orthopedic Surgery  This document was dictated using Dragon voice recognition software. A  reasonable attempt at proof reading has been made to minimize errors.

## 2023-09-12 ENCOUNTER — Ambulatory Visit: Admitting: Physical Therapy

## 2023-09-13 ENCOUNTER — Ambulatory Visit: Payer: Self-pay | Admitting: Physical Therapy

## 2023-09-13 DIAGNOSIS — M6281 Muscle weakness (generalized): Secondary | ICD-10-CM | POA: Diagnosis not present

## 2023-09-13 DIAGNOSIS — R262 Difficulty in walking, not elsewhere classified: Secondary | ICD-10-CM

## 2023-09-13 DIAGNOSIS — M25551 Pain in right hip: Secondary | ICD-10-CM

## 2023-09-13 NOTE — Therapy (Signed)
 OUTPATIENT PHYSICAL THERAPY TREATMENT    Patient Name: Rebecca Long MRN: [id number] DOB:29-Nov-1950, 73 y.o., female Today's Date: 09/13/2023     END OF SESSION:  PT End of Session - 09/13/23 0851     Visit Number 17    Date for PT Re-Evaluation 10/31/23    Authorization Type HTA no auth required    Progress Note Due on Visit 20    PT Start Time [phone]    PT Stop Time 0930    PT Time Calculation (min) 43 min    Activity Tolerance Patient tolerated treatment well           Past Medical History:  Diagnosis Date   Anxiety    Cavus foot, acquired 09/09/2012    bilateral cavus feet the one on the left shows that she has some probable neurogenic weakness with abnormal curvature and in inversion contracture Podiatry 06/2015: H&P and x-ray reviewed with patient. Today I went ahead and I did a proximal nerve block I was able to aspirate the second MPJ and got out amount of clear fluid and injected with a quarter cc dexamethasone  Kenalog  and ap   DDD (degenerative disc disease), lumbar    Depression    Endometrial polyp    GERD (gastroesophageal reflux disease)    Hiatal hernia    Hip flexor tendinitis, right 07/02/2016   History of adenomatous polyp of colon    History of esophageal stricture    History of gastric polyp 11/2017   History of PCR DNA positive for HSV2 11/12/2016   History of primary hyperparathyroidism    s/p  left parathryoidectomy 09-17-2008-- resolved   Hyperlipidemia    Hypertension    Insomnia    Lumbar stenosis    Osteopenia    PONV (postoperative nausea and vomiting)    Schatzki's ring    Scoliosis    Tendinopathy of right biceps tendon 07/02/2016   Thickened endometrium    Uterine fibroid    UTI (urinary tract infection) due to Enterococcus 01/05/2020   Wears glasses    Past Surgical History:  Procedure Laterality Date   BREAST REDUCTION SURGERY Bilateral 2000   COLONOSCOPY     CYST EXCISION  1985   head and rt axilla   DILATATION &  CURETTAGE/HYSTEROSCOPY WITH MYOSURE N/A 08/13/2018   Procedure: DILATATION & CURETTAGE/HYSTEROSCOPY WITH MYOSURE;  Surgeon: Lavoie, Marie-Lyne, MD;  Location: [facility];  Service: Gynecology;  Laterality: N/A;  requests 10:15am in [place] Gyn block request 30 minutes   DILATION AND CURETTAGE OF UTERUS N/A 08/13/2018   ECTOPIC PREGNANCY SURGERY  1981   OPEN SURGICAL REPAIR OF GLUTEAL TENDON Right 07/02/2023   Procedure: REPAIR, TENDON, GLUTEUS MEDIUS, OPEN;  Surgeon: Genelle Standing, MD;  Location: [facility];  Service: Orthopedics;  Laterality: Right;  RIGHT GLUTEUS MAXIMUS TENDON TRANSFER WITH COLLAGEN PATCH AUGMENTATION   PARATHYROIDECTOMY Left 09/17/2008   dr jeoffrey dawn  @[facility]    left superior  (adenoma)   REDUCTION MAMMAPLASTY Bilateral 2000   years ago   SHOULDER SURGERY Right 2007   frozen   UPPER GASTROINTESTINAL ENDOSCOPY  last one 12-18-2017   dr wilhelmenia   Patient Active Problem List   Diagnosis Date Noted   Tendinopathy of gluteus medius 07/02/2023   Greater trochanteric bursitis of right hip 04/17/2022   C. difficile diarrhea 03/08/2022   Arthritis of right sacroiliac joint (HCC) 02/26/2022   Encounter for monitoring long-term proton pump inhibitor therapy 07/05/2021   Hx of colonic polyps  06/06/2021   Incontinence of feces with fecal urgency 06/06/2021   Sleep disorder 11/29/2020   Benign paroxysmal positional vertigo of right ear 08/30/2020   AC (acromioclavicular) arthritis 10/22/2019   Adhesive bursitis of left shoulder 07/22/2019   GERD with esophagitis 12/30/2017   Hyperlipidemia 05/15/2017   Morbid obesity (HCC) 11/12/2016   Essential hypertension, benign 10/06/2014   Depression with anxiety 10/06/2014   Stress incontinence 10/06/2014   Osteoporosis 10/06/2014   Degenerative disc disease, lumbar 09/09/2012   Spinal stenosis of lumbar region 09/09/2012    PCP: Charlies Bellini, DO  REFERRING PROVIDER: Elspeth Parker,  MD  REFERRING DIAG: post-op REPAIR, TENDON, GLUTEUS MEDIUS, OPEN (Right) - RIGHT GLUTEUS MAXIMUS TENDON TRANSFER WITH COLLAGEN PATCH AUGMENTATION  Rationale for Evaluation and Treatment: Rehabilitation  THERAPY DIAG:  Muscle weakness (generalized)  Difficulty in walking, not elsewhere classified  Pain in right hip  ONSET DATE: 07/02/23 surgery date - chronic pain and weakness prior to surgery  SUBJECTIVE:                                                                                                                                                                                           SUBJECTIVE STATEMENT: Patient saw the surgeon this week.  She states he feels she is progressing as expected.  Next follow up in 2 1/2 months.   Used to play pickleball  PAIN:  PAIN:  Are you having pain? Yes NPRS scale: 0 no pain  Pain location: Rt lateral hip Pain orientation: Right  PAIN TYPE: aching Pain description: intermittent, dull, and aching  Aggravating factors: laying on Rt side, in/out of car, walking Relieving factors: sitting   PRECAUTIONS: Other: protocol from Dr. Parker scanned into media section of chart - avoid until 6 weeks post-op which is 08/13/23: passive ADDuction and ER, active ABDuction and IR  RED FLAGS: None   WEIGHT BEARING RESTRICTIONS: PWB initially, doctor just cleared to get rid of walking stick when ready  FALLS:  Has patient fallen in last 6 months? Yes. Number of falls 1, while carrying a planter and going up stairs  LIVING ENVIRONMENT: Lives with: lives with their spouse Lives in: House/apartment Stairs: Yes: External: 3 steps; on left going up Has following equipment at home: Single point cane  OCCUPATION: retired  PLOF: Independent  PATIENT GOALS: walk without pain and walking stick, can I get back to pickleball?  NEXT MD VISIT: 3 weeks  OBJECTIVE:  Note: Objective measures were completed at Evaluation unless otherwise noted.  DIAGNOSTIC  FINDINGS:  Xray (3 views right hip): No femoral acetabular osteoarthritis   MRI (right hip): There is an  end-stage full-thickness tear of the gluteus medius with significant atrophy on T1 coronal view  PATIENT SURVEYS:  LEFS: 44/80 7/16: LEFS  42/80  COGNITION: Overall cognitive status: Within functional limits for tasks assessed     SENSATION: WFL  MUSCLE LENGTH:   POSTURE: flexed trunk   PALPATION: Mild tenderness around incision - steri strips placed today after stitches were removed  LUMBAR ROM:  WFL  LOWER EXTREMITY ROM:    Pt able to perform seated march A/ROM and quadruped rocking for hip flexion beyond 90 deg without pain  LOWER EXTREMITY MMT:   Lt LE 4+/5 Rt hip at least 3+/5 - isometric testing only, resisted isometrics strong and painfree   FUNCTIONAL TESTS:  Eval: 5 times sit to stand: 13.23 Timed up and go (TUG): 12.24 using walking stick 2 MWT: 315 feet using single walking stick in Lt UE  08/01/2023: 6 minute walk test:  759 ft with [facility] 08/07/23: 6 minute walk test: 781' with [facility] 7/15: 5x STS with hands: 10.40 5x STS no hands 11.19 TUG: with cane: 9.29 sec  09/05/23: 6 MWT 1010 feet with [facility] 5x STS no hands 9.94   GAIT: Distance walked: 315' in 2 min  Assistive device utilized: single walking stick Level of assistance: Modified independence Comments: Rt early stance phase Trendelenburg  TREATMENT DATE: 09/13/23 Nu-Step L1 5 min - PT student monitored and discussed pt status Cable single handle 5# trunk rotation 10x right/left Cable single handle 5# 3 side steps 6x right/left close supervision for safety Offset Bil heel raises (more weight on surgery leg) 10x 4 inch forward step up 10x right WB on right with 4 inch step taps 8x (challenging) 4 inch lateral step ups with opposite hip abduction 10x (needs heavy UE support on right) Leg press seat 6 80# bil 12x; 40# single leg 10x right/left (increased from last visit)  Matrix abduction  30#  x10 (very difficult with left leg abduction which means WB on right)  Matrix extension bilaterally 30# x10- back pain when WB on right  Seated ball roll outs to stretch low back    09/10/23 Nu-Step L1 5 min - PT student monitored and discussed pt status Bil heel raises 10x Bil heel raises with ball between ankles 2x10- for Post tib strengthening Supine posterior pelvic tilt 2x10-  neuromuscular reeducation to promote lumbopelvic stability  Supine hip ER fall outs 2x10 with red loop  (increase resistance next visit) Matrix abd right leg moving leg 35# 2x10-this was difficult Matrix extension bilaterally 35# 2x10- this was challenging  4 inch forward step up 10x  4 inch lateral step up 10x-lateral was easier for her than front/back Leg press seat 6 75lb  2x10, single leg 35# 2x10 right/left Open books in side lying x10 each side- she liked this  09/05/23: Nu-Step L1 5 min low spms for warm up 6 MWT 5x STS as above Bil heel raises 10x 4 inch forward step up 10x 4 inch lateral step up 10x WB on right with left foot foam roll on floor back and forth 10x (no UE support needed) WB on right with left foot on small ball circles 2 sets of 5 (needed mod UE support) Leg press seat 6 75lb  2x10, single leg 35# 2x10 right/left Cable column 5# trunk rotation in staggered stance10x to each side Supine bridge 5x; single leg bridge 5x right/left Manual: soft tissue elongation to Rt glute med  09/03/23: Rocking forward and back with foot on the 2nd step  At the stairs 2nd step hamstring stretch 5x right/left WB on right with left foot step taps 2 sets of 5 (needs 2-3 fingers to support on each side for stability) Leg press seat 6 75lb  2x10, single leg 35# 2x10 right/left Foam weight shifting in staggered stance forward/back 10x each Lateral step ups on foam with hip abduction 2 sets of 5 (focusing on activating glutes) Supine bridge 5x; bridge with Les on on green ball 2 sets of 5  Supine trunk  rotation with green ball 10x (feels good) Sit to stand from mat table with blue loop above knees 5x; staggered stance with right foot back 5x 6 feet side step with blue loop above knees in front of the mat table x2 with hand held assist Cable column 5# trunk rotation in standard stance10x to each side PATIENT EDUCATION:  Education details: [email or web address] Person educated: Patient Education method: Explanation, Demonstration, Verbal cues, and Handouts Education comprehension: verbalized understanding and returned demonstration  HOME EXERCISE PROGRAM: Access Code: [email or web address] URL: [email or web address] Date: 07/12/2023 Prepared by: Orvil Beuhring  Exercises - Supine Posterior Pelvic Tilt  - 2 x daily - 7 x weekly - 2 sets - 10 reps - Supine Short Arc Quad  - 2 x daily - 7 x weekly - 2 sets - 10 reps - Supine Hip Adduction Isometric with Ball  - 2 x daily - 7 x weekly - 2 sets - 10 reps - 5 hold - Quadruped Rocking Backward  - 2 x daily - 7 x weekly - 1-2 sets - 10 reps - 5 hold - Seated Isometric Hip Abduction  - 2 x daily - 7 x weekly - 2 sets - 10 reps - 5 hold - Standing Knee Flexion AROM with Chair Support  - 2 x daily - 7 x weekly - 2 sets - 10 reps  ASSESSMENT:  CLINICAL IMPRESSION: Interventions performed to improve strength and balance needed for prolonged standing,  community distance walking for grocery shopping and medical appointments and safe negotiation of curbs and steps.  Neuromuscular re-education performed for gluteal activation in the stance position.  She reports low back pain which is most likely due to overuse of spinal musculature to compensate for gluteal weakness.  She continues to need UE support with weight bearing on right side secondary to weakness.  Therapist providing verbal cues to optimize form and to monitor for excessive muscular fatigue.     OBJECTIVE IMPAIRMENTS: Abnormal gait, decreased balance, decreased coordination, difficulty walking,  decreased strength, increased edema, impaired flexibility, improper body mechanics, postural dysfunction, and pain.   ACTIVITY LIMITATIONS: lifting, bending, squatting, stairs, and locomotion level  PARTICIPATION LIMITATIONS: cleaning, laundry, shopping, community activity, and yard work  PERSONAL FACTORS: Age are also affecting patient's functional outcome.   REHAB POTENTIAL: Excellent  CLINICAL DECISION MAKING: Evolving/moderate complexity  EVALUATION COMPLEXITY: Moderate   GOALS: Goals reviewed with patient? Yes  SHORT TERM GOALS: Target date: 08/09/23  Pt will be ind with initial HEP Baseline: Goal status: Met on 08/01/23  2.  Pt will be educated on movements to avoid to follow post-op protocol to protect tendon transfer site Baseline:  Goal status: MET  3.  Pt will demo improved Rt hip control in stance phase of gait with single walking stick as needed. Baseline:  Goal status: MET 08/07/23    LONG TERM GOALS: Target date: 10/31/2023   Pt will be ind with advanced HEP Baseline:  Goal status: ongoing  2.  Pt will improve LEFS score  to at least 55/80 to demo improved function Baseline: 44/80 Goal status: ongoing  3.  Pt will perform 6 min walk test covering at least 700' with walking stick as needed Baseline:  Goal status: MET 08/07/23 with walking stick  4.  Pt will be able to demo squat, lift 10lb, carry 10lb and walk to mimic household chores and yard activities with proper body mechanics without pain. Baseline:  Goal status: ongoing  5.  Pt will improve glut strength to at least 4+/5 to improve gait mechanics, stairs and transfers. Baseline:  Goal status: Progressing   6.  5x STS improved to 11 sec or less to demo improved functional strength and reduce fall risk. Baseline: 13.23 Goal status: ongoing  PLAN:  PT FREQUENCY: 2x/week  PT DURATION: 8 weeks  PLANNED INTERVENTIONS: 97110-Therapeutic exercises, 97530- Therapeutic activity, [id number]- Neuromuscular  re-education, 97535- Self Care, [id number]- Manual therapy, [phone]- Gait training, Patient/Family education, Balance training, Stair training, Cryotherapy, and Moist heat.  PLAN FOR NEXT SESSION:  hip machine; leg press; unilateral cable column rotations, step downs, side stepping with band;  gluteal muscle activation; hip mobilizations; Continue to work on gait endurance, Hip stability exercises, weight bearing   Glade Pesa, PT 09/13/23 10:42 AM Phone: [phone] Fax: [phone]    [facility] [address], [address] [place], [place] [address] Phone # [phone] Fax [phone]

## 2023-09-17 ENCOUNTER — Encounter

## 2023-09-19 ENCOUNTER — Encounter: Admitting: Physical Therapy

## 2023-09-24 ENCOUNTER — Ambulatory Visit

## 2023-09-24 DIAGNOSIS — M6281 Muscle weakness (generalized): Secondary | ICD-10-CM | POA: Diagnosis not present

## 2023-09-24 DIAGNOSIS — M25551 Pain in right hip: Secondary | ICD-10-CM

## 2023-09-24 DIAGNOSIS — R262 Difficulty in walking, not elsewhere classified: Secondary | ICD-10-CM

## 2023-09-24 DIAGNOSIS — R293 Abnormal posture: Secondary | ICD-10-CM

## 2023-09-24 DIAGNOSIS — R2689 Other abnormalities of gait and mobility: Secondary | ICD-10-CM

## 2023-09-24 DIAGNOSIS — R252 Cramp and spasm: Secondary | ICD-10-CM

## 2023-09-24 NOTE — Therapy (Signed)
 OUTPATIENT PHYSICAL THERAPY TREATMENT    Patient Name: Rebecca Long MRN: [id number] DOB:March 21, 1950, 73 y.o., female Today's Date: 09/24/2023     END OF SESSION:  PT End of Session - 09/24/23 1229     Visit Number 18    Date for PT Re-Evaluation 10/31/23    Authorization Type HTA no auth required    Progress Note Due on Visit 20    PT Start Time 1148    PT Stop Time 1227    PT Time Calculation (min) 39 min    Activity Tolerance Patient tolerated treatment well    Behavior During Therapy WFL for tasks assessed/performed            Past Medical History:  Diagnosis Date   Anxiety    Cavus foot, acquired 09/09/2012    bilateral cavus feet the one on the left shows that she has some probable neurogenic weakness with abnormal curvature and in inversion contracture Podiatry 06/2015: H&P and x-ray reviewed with patient. Today I went ahead and I did a proximal nerve block I was able to aspirate the second MPJ and got out amount of clear fluid and injected with a quarter cc dexamethasone  Kenalog  and ap   DDD (degenerative disc disease), lumbar    Depression    Endometrial polyp    GERD (gastroesophageal reflux disease)    Hiatal hernia    Hip flexor tendinitis, right 07/02/2016   History of adenomatous polyp of colon    History of esophageal stricture    History of gastric polyp 11/2017   History of PCR DNA positive for HSV2 11/12/2016   History of primary hyperparathyroidism    s/p  left parathryoidectomy 09-17-2008-- resolved   Hyperlipidemia    Hypertension    Insomnia    Lumbar stenosis    Osteopenia    PONV (postoperative nausea and vomiting)    Schatzki's ring    Scoliosis    Tendinopathy of right biceps tendon 07/02/2016   Thickened endometrium    Uterine fibroid    UTI (urinary tract infection) due to Enterococcus 01/05/2020   Wears glasses    Past Surgical History:  Procedure Laterality Date   BREAST REDUCTION SURGERY Bilateral 2000   COLONOSCOPY     CYST  EXCISION  1985   head and rt axilla   DILATATION & CURETTAGE/HYSTEROSCOPY WITH MYOSURE N/A 08/13/2018   Procedure: DILATATION & CURETTAGE/HYSTEROSCOPY WITH MYOSURE;  Surgeon: Lavoie, Marie-Lyne, MD;  Location: [facility];  Service: Gynecology;  Laterality: N/A;  requests 10:15am in [place] Gyn block request 30 minutes   DILATION AND CURETTAGE OF UTERUS N/A 08/13/2018   ECTOPIC PREGNANCY SURGERY  1981   OPEN SURGICAL REPAIR OF GLUTEAL TENDON Right 07/02/2023   Procedure: REPAIR, TENDON, GLUTEUS MEDIUS, OPEN;  Surgeon: Genelle Standing, MD;  Location: [facility];  Service: Orthopedics;  Laterality: Right;  RIGHT GLUTEUS MAXIMUS TENDON TRANSFER WITH COLLAGEN PATCH AUGMENTATION   PARATHYROIDECTOMY Left 09/17/2008   dr jeoffrey dawn  @[facility]    left superior  (adenoma)   REDUCTION MAMMAPLASTY Bilateral 2000   years ago   SHOULDER SURGERY Right 2007   frozen   UPPER GASTROINTESTINAL ENDOSCOPY  last one 12-18-2017   dr wilhelmenia   Patient Active Problem List   Diagnosis Date Noted   Tendinopathy of gluteus medius 07/02/2023   Greater trochanteric bursitis of right hip 04/17/2022   C. difficile diarrhea 03/08/2022   Arthritis of right sacroiliac joint (HCC) 02/26/2022   Encounter for monitoring long-term  proton pump inhibitor therapy 07/05/2021   Hx of colonic polyps 06/06/2021   Incontinence of feces with fecal urgency 06/06/2021   Sleep disorder 11/29/2020   Benign paroxysmal positional vertigo of right ear 08/30/2020   AC (acromioclavicular) arthritis 10/22/2019   Adhesive bursitis of left shoulder 07/22/2019   GERD with esophagitis 12/30/2017   Hyperlipidemia 05/15/2017   Morbid obesity (HCC) 11/12/2016   Essential hypertension, benign 10/06/2014   Depression with anxiety 10/06/2014   Stress incontinence 10/06/2014   Osteoporosis 10/06/2014   Degenerative disc disease, lumbar 09/09/2012   Spinal stenosis of lumbar region 09/09/2012    PCP: Charlies Bellini,  DO  REFERRING PROVIDER: Elspeth Parker, MD  REFERRING DIAG: post-op REPAIR, TENDON, GLUTEUS MEDIUS, OPEN (Right) - RIGHT GLUTEUS MAXIMUS TENDON TRANSFER WITH COLLAGEN PATCH AUGMENTATION  Rationale for Evaluation and Treatment: Rehabilitation  THERAPY DIAG:  Muscle weakness (generalized)  Difficulty in walking, not elsewhere classified  Pain in right hip  Cramp and spasm  Other abnormalities of gait and mobility  Abnormal posture  ONSET DATE: 07/02/23 surgery date - chronic pain and weakness prior to surgery  SUBJECTIVE:                                                                                                                                                                                           SUBJECTIVE STATEMENT: I traveled last week and spent a lot of time in the car.  I have a ways to go with my recovery.    PAIN:  PAIN:  Are you having pain? Yes NPRS scale: 0 no pain  Pain location: Rt lateral hip Pain orientation: Right  PAIN TYPE: aching Pain description: intermittent, dull, and aching  Aggravating factors: laying on Rt side, in/out of car, walking Relieving factors: sitting   PRECAUTIONS: Other: protocol from Dr. Parker scanned into media section of chart - avoid until 6 weeks post-op which is 08/13/23: passive ADDuction and ER, active ABDuction and IR  RED FLAGS: None   WEIGHT BEARING RESTRICTIONS: PWB initially, doctor just cleared to get rid of walking stick when ready  FALLS:  Has patient fallen in last 6 months? Yes. Number of falls 1, while carrying a planter and going up stairs  LIVING ENVIRONMENT: Lives with: lives with their spouse Lives in: House/apartment Stairs: Yes: External: 3 steps; on left going up Has following equipment at home: Single point cane  OCCUPATION: retired  PLOF: Independent  PATIENT GOALS: walk without pain and walking stick, can I get back to pickleball?  NEXT MD VISIT: 3 weeks  OBJECTIVE:  Note: Objective  measures were completed at Evaluation unless otherwise noted.  DIAGNOSTIC FINDINGS:  Xray (3 views right hip): No femoral acetabular osteoarthritis   MRI (right hip): There is an end-stage full-thickness tear of the gluteus medius with significant atrophy on T1 coronal view  PATIENT SURVEYS:  LEFS: 44/80 7/16: LEFS  42/80  COGNITION: Overall cognitive status: Within functional limits for tasks assessed     SENSATION: WFL  MUSCLE LENGTH:   POSTURE: flexed trunk   PALPATION: Mild tenderness around incision - steri strips placed today after stitches were removed  LUMBAR ROM:  WFL  LOWER EXTREMITY ROM:    Pt able to perform seated march A/ROM and quadruped rocking for hip flexion beyond 90 deg without pain  LOWER EXTREMITY MMT:   Lt LE 4+/5 Rt hip at least 3+/5 - isometric testing only, resisted isometrics strong and painfree   FUNCTIONAL TESTS:  Eval: 5 times sit to stand: 13.23 Timed up and go (TUG): 12.24 using walking stick 2 MWT: 315 feet using single walking stick in Lt UE  08/01/2023: 6 minute walk test:  759 ft with [facility] 08/07/23: 6 minute walk test: 781' with [facility] 7/15: 5x STS with hands: 10.40 5x STS no hands 11.19 TUG: with cane: 9.29 sec  09/05/23: 6 MWT 1010 feet with SPC 5x STS no hands 9.94   GAIT: Distance walked: 315' in 2 min  Assistive device utilized: single walking stick Level of assistance: Modified independence Comments: Rt early stance phase Trendelenburg  TREATMENT DATE:  09/24/23 Nu-Step L5 6 min - PT student monitored and discussed pt status Resisted walking: 10# forward and reverse x5 reps with CGA by PT for safety 4 inch forward step up 2x10 right WB on right with 4 inch step taps 2x10 (challenging) 4 inch lateral step ups with opposite hip abduction 10x (needs heavy UE support on right) Sit to stand: blue band around thighs 2x10 Sidestepping at barre: blue loop around thighs x4 laps  Leg press seat 6 80# bil 12x; 40# single  leg 10x right/left (increased from last visit)  Forward T with UE support on barre x12   09/13/23 Nu-Step L1 5 min - PT student monitored and discussed pt status Cable single handle 5# trunk rotation 10x right/left Cable single handle 5# 3 side steps 6x right/left close supervision for safety Offset Bil heel raises (more weight on surgery leg) 10x 4 inch forward step up 10x right WB on right with 4 inch step taps 8x (challenging) 4 inch lateral step ups with opposite hip abduction 10x (needs heavy UE support on right) Leg press seat 6 80# bil 12x; 40# single leg 10x right/left (increased from last visit)  Matrix abduction  30# x10 (very difficult with left leg abduction which means WB on right)  Matrix extension bilaterally 30# x10- back pain when WB on right  Seated ball roll outs to stretch low back    09/10/23 Nu-Step L1 5 min - PT student monitored and discussed pt status Bil heel raises 10x Bil heel raises with ball between ankles 2x10- for Post tib strengthening Supine posterior pelvic tilt 2x10-  neuromuscular reeducation to promote lumbopelvic stability  Supine hip ER fall outs 2x10 with red loop  (increase resistance next visit) Matrix abd right leg moving leg 35# 2x10-this was difficult Matrix extension bilaterally 35# 2x10- this was challenging  4 inch forward step up 10x  4 inch lateral step up 10x-lateral was easier for her than front/back Leg press seat 6 75lb  2x10, single leg 35# 2x10 right/left Open books in side lying  x10 each side- she liked this   PATIENT EDUCATION:  Education details: [phone] Person educated: Patient Education method: Explanation, Demonstration, Verbal cues, and Handouts Education comprehension: verbalized understanding and returned demonstration  HOME EXERCISE PROGRAM: Access Code: [email or web address] URL: [email or web address] Date: 07/12/2023 Prepared by: Orvil Beuhring  Exercises - Supine Posterior Pelvic Tilt  - 2 x daily -  7 x weekly - 2 sets - 10 reps - Supine Short Arc Quad  - 2 x daily - 7 x weekly - 2 sets - 10 reps - Supine Hip Adduction Isometric with Ball  - 2 x daily - 7 x weekly - 2 sets - 10 reps - 5 hold - Quadruped Rocking Backward  - 2 x daily - 7 x weekly - 1-2 sets - 10 reps - 5 hold - Seated Isometric Hip Abduction  - 2 x daily - 7 x weekly - 2 sets - 10 reps - 5 hold - Standing Knee Flexion AROM with Chair Support  - 2 x daily - 7 x weekly - 2 sets - 10 reps  ASSESSMENT:  CLINICAL IMPRESSION: Pt was out of town last week and was not able to be consistent with exercises.  Gait remains antalgic and she is working on symmetry. She continues to need UE support with weight bearing on right side secondary to weakness.  She continues to be challenged by current level of exercise.  Therapist providing verbal cues to optimize form and to monitor for excessive muscular fatigue. Patient will benefit from skilled PT to address the below impairments and improve overall function.    OBJECTIVE IMPAIRMENTS: Abnormal gait, decreased balance, decreased coordination, difficulty walking, decreased strength, increased edema, impaired flexibility, improper body mechanics, postural dysfunction, and pain.   ACTIVITY LIMITATIONS: lifting, bending, squatting, stairs, and locomotion level  PARTICIPATION LIMITATIONS: cleaning, laundry, shopping, community activity, and yard work  PERSONAL FACTORS: Age are also affecting patient's functional outcome.   REHAB POTENTIAL: Excellent  CLINICAL DECISION MAKING: Evolving/moderate complexity  EVALUATION COMPLEXITY: Moderate   GOALS: Goals reviewed with patient? Yes  SHORT TERM GOALS: Target date: 08/09/23  Pt will be ind with initial HEP Baseline: Goal status: Met on 08/01/23  2.  Pt will be educated on movements to avoid to follow post-op protocol to protect tendon transfer site Baseline:  Goal status: MET  3.  Pt will demo improved Rt hip control in stance phase of  gait with single walking stick as needed. Baseline:  Goal status: MET 08/07/23    LONG TERM GOALS: Target date: 10/31/2023   Pt will be ind with advanced HEP Baseline:  Goal status: ongoing  2.  Pt will improve LEFS score to at least 55/80 to demo improved function Baseline: 44/80 Goal status: ongoing  3.  Pt will perform 6 min walk test covering at least 700' with walking stick as needed Baseline:  Goal status: MET 08/07/23 with walking stick  4.  Pt will be able to demo squat, lift 10lb, carry 10lb and walk to mimic household chores and yard activities with proper body mechanics without pain. Baseline:  Goal status: ongoing  5.  Pt will improve glut strength to at least 4+/5 to improve gait mechanics, stairs and transfers. Baseline:  Goal status: Progressing   6.  5x STS improved to 11 sec or less to demo improved functional strength and reduce fall risk. Baseline: 13.23 Goal status: ongoing  PLAN:  PT FREQUENCY: 2x/week  PT DURATION: 8 weeks  PLANNED INTERVENTIONS: 97110-Therapeutic exercises, 97530- Therapeutic activity,  02887- Neuromuscular re-education, [phone]- Self Care, 02859- Manual therapy, [phone]- Gait training, Patient/Family education, Balance training, Stair training, Cryotherapy, and Moist heat.  PLAN FOR NEXT SESSION:  hip machine; leg press; unilateral cable column rotations, step downs, side stepping with band;  gluteal muscle activation; hip mobilizations; Continue to work on gait endurance, Hip stability exercises, weight bearing  Burnard Joy, PT 09/24/23 12:30 PM     [facility] [address], [address] [place], [place] [address] Phone # [phone] Fax [phone]

## 2023-09-26 ENCOUNTER — Encounter

## 2023-10-01 ENCOUNTER — Ambulatory Visit: Attending: Orthopaedic Surgery | Admitting: Physical Therapy

## 2023-10-01 DIAGNOSIS — R252 Cramp and spasm: Secondary | ICD-10-CM | POA: Insufficient documentation

## 2023-10-01 DIAGNOSIS — R2689 Other abnormalities of gait and mobility: Secondary | ICD-10-CM | POA: Diagnosis not present

## 2023-10-01 DIAGNOSIS — R293 Abnormal posture: Secondary | ICD-10-CM | POA: Diagnosis not present

## 2023-10-01 DIAGNOSIS — R262 Difficulty in walking, not elsewhere classified: Secondary | ICD-10-CM | POA: Insufficient documentation

## 2023-10-01 DIAGNOSIS — M6281 Muscle weakness (generalized): Secondary | ICD-10-CM | POA: Insufficient documentation

## 2023-10-01 DIAGNOSIS — M25551 Pain in right hip: Secondary | ICD-10-CM | POA: Insufficient documentation

## 2023-10-01 NOTE — Therapy (Signed)
 OUTPATIENT PHYSICAL THERAPY TREATMENT    Patient Name: Rebecca Long MRN: [id number] DOB:11/10/1950, 73 y.o., female Today's Date: 10/01/2023     END OF SESSION:  PT End of Session - 10/01/23 1016     Visit Number 19    Date for PT Re-Evaluation 10/31/23    Authorization Type HTA no auth required    Progress Note Due on Visit 20    PT Start Time 1017    PT Stop Time 1100    PT Time Calculation (min) 43 min    Activity Tolerance Patient tolerated treatment well            Past Medical History:  Diagnosis Date   Anxiety    Cavus foot, acquired 09/09/2012    bilateral cavus feet the one on the left shows that she has some probable neurogenic weakness with abnormal curvature and in inversion contracture Podiatry 06/2015: H&P and x-ray reviewed with patient. Today I went ahead and I did a proximal nerve block I was able to aspirate the second MPJ and got out amount of clear fluid and injected with a quarter cc dexamethasone  Kenalog  and ap   DDD (degenerative disc disease), lumbar    Depression    Endometrial polyp    GERD (gastroesophageal reflux disease)    Hiatal hernia    Hip flexor tendinitis, right 07/02/2016   History of adenomatous polyp of colon    History of esophageal stricture    History of gastric polyp 11/2017   History of PCR DNA positive for HSV2 11/12/2016   History of primary hyperparathyroidism    s/p  left parathryoidectomy 09-17-2008-- resolved   Hyperlipidemia    Hypertension    Insomnia    Lumbar stenosis    Osteopenia    PONV (postoperative nausea and vomiting)    Schatzki's ring    Scoliosis    Tendinopathy of right biceps tendon 07/02/2016   Thickened endometrium    Uterine fibroid    UTI (urinary tract infection) due to Enterococcus 01/05/2020   Wears glasses    Past Surgical History:  Procedure Laterality Date   BREAST REDUCTION SURGERY Bilateral 2000   COLONOSCOPY     CYST EXCISION  1985   head and rt axilla   DILATATION &  CURETTAGE/HYSTEROSCOPY WITH MYOSURE N/A 08/13/2018   Procedure: DILATATION & CURETTAGE/HYSTEROSCOPY WITH MYOSURE;  Surgeon: Lavoie, Marie-Lyne, MD;  Location: [facility];  Service: Gynecology;  Laterality: N/A;  requests 10:15am in [place] Gyn block request 30 minutes   DILATION AND CURETTAGE OF UTERUS N/A 08/13/2018   ECTOPIC PREGNANCY SURGERY  1981   OPEN SURGICAL REPAIR OF GLUTEAL TENDON Right 07/02/2023   Procedure: REPAIR, TENDON, GLUTEUS MEDIUS, OPEN;  Surgeon: Genelle Standing, MD;  Location: [facility];  Service: Orthopedics;  Laterality: Right;  RIGHT GLUTEUS MAXIMUS TENDON TRANSFER WITH COLLAGEN PATCH AUGMENTATION   PARATHYROIDECTOMY Left 09/17/2008   dr jeoffrey dawn  @[facility]    left superior  (adenoma)   REDUCTION MAMMAPLASTY Bilateral 2000   years ago   SHOULDER SURGERY Right 2007   frozen   UPPER GASTROINTESTINAL ENDOSCOPY  last one 12-18-2017   dr wilhelmenia   Patient Active Problem List   Diagnosis Date Noted   Tendinopathy of gluteus medius 07/02/2023   Greater trochanteric bursitis of right hip 04/17/2022   C. difficile diarrhea 03/08/2022   Arthritis of right sacroiliac joint (HCC) 02/26/2022   Encounter for monitoring long-term proton pump inhibitor therapy 07/05/2021   Hx of colonic  polyps 06/06/2021   Incontinence of feces with fecal urgency 06/06/2021   Sleep disorder 11/29/2020   Benign paroxysmal positional vertigo of right ear 08/30/2020   AC (acromioclavicular) arthritis 10/22/2019   Adhesive bursitis of left shoulder 07/22/2019   GERD with esophagitis 12/30/2017   Hyperlipidemia 05/15/2017   Morbid obesity (HCC) 11/12/2016   Essential hypertension, benign 10/06/2014   Depression with anxiety 10/06/2014   Stress incontinence 10/06/2014   Osteoporosis 10/06/2014   Degenerative disc disease, lumbar 09/09/2012   Spinal stenosis of lumbar region 09/09/2012    PCP: Charlies Bellini, DO  REFERRING PROVIDER: Elspeth Parker,  MD  REFERRING DIAG: post-op REPAIR, TENDON, GLUTEUS MEDIUS, OPEN (Right) - RIGHT GLUTEUS MAXIMUS TENDON TRANSFER WITH COLLAGEN PATCH AUGMENTATION  Rationale for Evaluation and Treatment: Rehabilitation  THERAPY DIAG:  Muscle weakness (generalized)  Difficulty in walking, not elsewhere classified  Pain in right hip  ONSET DATE: 07/02/23 surgery date - chronic pain and weakness prior to surgery  SUBJECTIVE:                                                                                                                                                                                           SUBJECTIVE STATEMENT: I just can't walk and that bothers me.  The doctor said 50% of his patients walk without a limp.  Walked a small circle at the park with my cane.  PAIN:  PAIN:  Are you having pain? Yes NPRS scale: 0 no pain  Pain location: Rt lateral hip Pain orientation: Right  PAIN TYPE: aching Pain description: intermittent, dull, and aching  Aggravating factors: laying on Rt side, in/out of car, walking Relieving factors: sitting   PRECAUTIONS: Other: protocol from Dr. Parker scanned into media section of chart - avoid until 6 weeks post-op which is 08/13/23: passive ADDuction and ER, active ABDuction and IR  RED FLAGS: None   WEIGHT BEARING RESTRICTIONS: PWB initially, doctor just cleared to get rid of walking stick when ready  FALLS:  Has patient fallen in last 6 months? Yes. Number of falls 1, while carrying a planter and going up stairs  LIVING ENVIRONMENT: Lives with: lives with their spouse Lives in: House/apartment Stairs: Yes: External: 3 steps; on left going up Has following equipment at home: Single point cane  OCCUPATION: retired  PLOF: Independent  PATIENT GOALS: walk without pain and walking stick, can I get back to pickleball?  NEXT MD VISIT: 3 weeks  OBJECTIVE:  Note: Objective measures were completed at Evaluation unless otherwise noted.  DIAGNOSTIC  FINDINGS:  Xray (3 views right hip): No femoral acetabular osteoarthritis   MRI (right hip): There  is an end-stage full-thickness tear of the gluteus medius with significant atrophy on T1 coronal view  PATIENT SURVEYS:  LEFS: 44/80 7/16: LEFS  42/80  COGNITION: Overall cognitive status: Within functional limits for tasks assessed     SENSATION: WFL  MUSCLE LENGTH:   POSTURE: flexed trunk   PALPATION: Mild tenderness around incision - steri strips placed today after stitches were removed  LUMBAR ROM:  WFL  LOWER EXTREMITY ROM:    Pt able to perform seated march A/ROM and quadruped rocking for hip flexion beyond 90 deg without pain  LOWER EXTREMITY MMT:   Lt LE 4+/5 Rt hip at least 3+/5 - isometric testing only, resisted isometrics strong and painfree   FUNCTIONAL TESTS:  Eval: 5 times sit to stand: 13.23 Timed up and go (TUG): 12.24 using walking stick 2 MWT: 315 feet using single walking stick in Lt UE  08/01/2023: 6 minute walk test:  759 ft with [facility] 08/07/23: 6 minute walk test: 781' with [facility] 7/15: 5x STS with hands: 10.40 5x STS no hands 11.19 TUG: with cane: 9.29 sec  09/05/23: 6 MWT 1010 feet with SPC 5x STS no hands 9.94   GAIT: Distance walked: 315' in 2 min  Assistive device utilized: single walking stick Level of assistance: Modified independence Comments: Rt early stance phase Trendelenburg  TREATMENT DATE: 10/01/23 Bike L1 5 min discussed pt status WB on right with 2 step taps 8x  6 inch right step ups 10x  6 inch right lateral step ups 7x (challenging)  Seated 5# resting on knee: hip flexion/abduction over the line 15x Staggered stance left hand holding 5# KB reach down toward  Standing on right LE: holding 5# KB with left hip flexion 2 sets of 5 Resisted walking with belt: 10# forward and reverse x5 reps with CGA by PT for safety Leg press seat 6 80# bil 15x; 40# single leg 15x right/left Supine with black band anchored on foot resisted  hip extension 10x2  09/24/23 Nu-Step L5 6 min - PT student monitored and discussed pt status Resisted walking: 10# forward and reverse x5 reps with CGA by PT for safety 4 inch forward step up 2x10 right WB on right with 4 inch step taps 2x10 (challenging) 4 inch lateral step ups with opposite hip abduction 10x (needs heavy UE support on right) Sit to stand: blue band around thighs 2x10 Sidestepping at barre: blue loop around thighs x4 laps  Leg press seat 6 80# bil 12x; 40# single leg 10x right/left (increased from last visit)  Forward T with UE support on barre x12   09/13/23 Nu-Step L1 5 min - PT student monitored and discussed pt status Cable single handle 5# trunk rotation 10x right/left Cable single handle 5# 3 side steps 6x right/left close supervision for safety Offset Bil heel raises (more weight on surgery leg) 10x 4 inch forward step up 10x right WB on right with 4 inch step taps 8x (challenging) 4 inch lateral step ups with opposite hip abduction 10x (needs heavy UE support on right) Leg press seat 6 80# bil 12x; 40# single leg 10x right/left (increased from last visit)  Matrix abduction  30# x10 (very difficult with left leg abduction which means WB on right)  Matrix extension bilaterally 30# x10- back pain when WB on right  Seated ball roll outs to stretch low back    09/10/23 Nu-Step L1 5 min - PT student monitored and discussed pt status Bil heel raises 10x Bil heel raises  with ball between ankles 2x10- for Post tib strengthening Supine posterior pelvic tilt 2x10-  neuromuscular reeducation to promote lumbopelvic stability  Supine hip ER fall outs 2x10 with red loop  (increase resistance next visit) Matrix abd right leg moving leg 35# 2x10-this was difficult Matrix extension bilaterally 35# 2x10- this was challenging  4 inch forward step up 10x  4 inch lateral step up 10x-lateral was easier for her than front/back Leg press seat 6 75lb  2x10, single leg 35# 2x10  right/left Open books in side lying x10 each side- she liked this   PATIENT EDUCATION:  Education details: [email or web address] Person educated: Patient Education method: Explanation, Demonstration, Verbal cues, and Handouts Education comprehension: verbalized understanding and returned demonstration  HOME EXERCISE PROGRAM: Access Code: [email or web address] URL: [email or web address] Date: 07/12/2023 Prepared by: Orvil Beuhring  Exercises - Supine Posterior Pelvic Tilt  - 2 x daily - 7 x weekly - 2 sets - 10 reps - Supine Short Arc Quad  - 2 x daily - 7 x weekly - 2 sets - 10 reps - Supine Hip Adduction Isometric with Ball  - 2 x daily - 7 x weekly - 2 sets - 10 reps - 5 hold - Quadruped Rocking Backward  - 2 x daily - 7 x weekly - 1-2 sets - 10 reps - 5 hold - Seated Isometric Hip Abduction  - 2 x daily - 7 x weekly - 2 sets - 10 reps - 5 hold - Standing Knee Flexion AROM with Chair Support  - 2 x daily - 7 x weekly - 2 sets - 10 reps  ASSESSMENT:  CLINICAL IMPRESSION: Therapist educating patient on expected length of time for expected recovery for post-op tissue healing. Single leg weight bearing on right is quite difficult secondary to ongoing gluteus medius weakness. Treatment focus on activation of this muscle and verbal cues to keep pelvis level and minimizing compensatory drop. She fatigues quickly with gluteal ex's in standing.     OBJECTIVE IMPAIRMENTS: Abnormal gait, decreased balance, decreased coordination, difficulty walking, decreased strength, increased edema, impaired flexibility, improper body mechanics, postural dysfunction, and pain.   ACTIVITY LIMITATIONS: lifting, bending, squatting, stairs, and locomotion level  PARTICIPATION LIMITATIONS: cleaning, laundry, shopping, community activity, and yard work  PERSONAL FACTORS: Age are also affecting patient's functional outcome.   REHAB POTENTIAL: Excellent  CLINICAL DECISION MAKING: Evolving/moderate  complexity  EVALUATION COMPLEXITY: Moderate   GOALS: Goals reviewed with patient? Yes  SHORT TERM GOALS: Target date: 08/09/23  Pt will be ind with initial HEP Baseline: Goal status: Met on 08/01/23  2.  Pt will be educated on movements to avoid to follow post-op protocol to protect tendon transfer site Baseline:  Goal status: MET  3.  Pt will demo improved Rt hip control in stance phase of gait with single walking stick as needed. Baseline:  Goal status: MET 08/07/23    LONG TERM GOALS: Target date: 10/31/2023   Pt will be ind with advanced HEP Baseline:  Goal status: ongoing  2.  Pt will improve LEFS score to at least 55/80 to demo improved function Baseline: 44/80 Goal status: ongoing  3.  Pt will perform 6 min walk test covering at least 700' with walking stick as needed Baseline:  Goal status: MET 08/07/23 with walking stick  4.  Pt will be able to demo squat, lift 10lb, carry 10lb and walk to mimic household chores and yard activities with proper body mechanics without pain. Baseline:  Goal status: ongoing  5.  Pt will improve glut strength to at least 4+/5 to improve gait mechanics, stairs and transfers. Baseline:  Goal status: Progressing   6.  5x STS improved to 11 sec or less to demo improved functional strength and reduce fall risk. Baseline: 13.23 Goal status: ongoing  PLAN:  PT FREQUENCY: 2x/week  PT DURATION: 8 weeks  PLANNED INTERVENTIONS: 97110-Therapeutic exercises, 97530- Therapeutic activity, [id number]- Neuromuscular re-education, 97535- Self Care, [id number]- Manual therapy, [phone]- Gait training, Patient/Family education, Balance training, Stair training, Cryotherapy, and Moist heat.  PLAN FOR NEXT SESSION:  hip machine; leg press; unilateral cable column rotations, step downs, side stepping with band;  gluteal muscle activation; hip mobilizations; Continue to work on gait endurance, Hip stability exercises, weight bearing  Glade Pesa, PT 10/01/23  11:07 AM Phone: [phone] Fax: [phone]  [facility] [address] [place], [place] [address] Phone # [phone] Fax [phone]

## 2023-10-03 ENCOUNTER — Ambulatory Visit: Admitting: Physical Therapy

## 2023-10-03 DIAGNOSIS — R262 Difficulty in walking, not elsewhere classified: Secondary | ICD-10-CM

## 2023-10-03 DIAGNOSIS — M6281 Muscle weakness (generalized): Secondary | ICD-10-CM

## 2023-10-03 DIAGNOSIS — M25551 Pain in right hip: Secondary | ICD-10-CM

## 2023-10-03 NOTE — Therapy (Signed)
 OUTPATIENT PHYSICAL THERAPY TREATMENT    Patient Name: Rebecca Long MRN: [id number] DOB:[birth year], 73 y.o., female Today's Date: 10/03/2023  Progress Note Reporting Period 08/14/23 to 10/03/2023  See note below for Objective Data and Assessment of Progress/Goals.       END OF SESSION:  PT End of Session - 10/03/23 1145     Visit Number 20    Date for PT Re-Evaluation 10/31/23    Authorization Type HTA no auth required    Progress Note Due on Visit 30    PT Start Time 1146    PT Stop Time 1229    PT Time Calculation (min) 43 min    Activity Tolerance Patient tolerated treatment well            Past Medical History:  Diagnosis Date   Anxiety    Cavus foot, acquired 09/09/2012    bilateral cavus feet the one on the left shows that she has some probable neurogenic weakness with abnormal curvature and in inversion contracture Podiatry 06/2015: H&P and x-ray reviewed with patient. Today I went ahead and I did a proximal nerve block I was able to aspirate the second MPJ and got out amount of clear fluid and injected with a quarter cc dexamethasone  Kenalog  and ap   DDD (degenerative disc disease), lumbar    Depression    Endometrial polyp    GERD (gastroesophageal reflux disease)    Hiatal hernia    Hip flexor tendinitis, right 07/02/2016   History of adenomatous polyp of colon    History of esophageal stricture    History of gastric polyp 11/2017   History of PCR DNA positive for HSV2 11/12/2016   History of primary hyperparathyroidism    s/p  left parathryoidectomy 09-17-2008-- resolved   Hyperlipidemia    Hypertension    Insomnia    Lumbar stenosis    Osteopenia    PONV (postoperative nausea and vomiting)    Schatzki's ring    Scoliosis    Tendinopathy of right biceps tendon 07/02/2016   Thickened endometrium    Uterine fibroid    UTI (urinary tract infection) due to Enterococcus 01/05/2020   Wears glasses    Past Surgical History:  Procedure Laterality Date    BREAST REDUCTION SURGERY Bilateral 2000   COLONOSCOPY     CYST EXCISION  1985   head and rt axilla   DILATATION & CURETTAGE/HYSTEROSCOPY WITH MYOSURE N/A 08/13/2018   Procedure: DILATATION & CURETTAGE/HYSTEROSCOPY WITH MYOSURE;  Surgeon: Lavoie, Marie-Lyne, MD;  Location: [facility];  Service: Gynecology;  Laterality: N/A;  requests 10:15am in [place] Gyn block request 30 minutes   DILATION AND CURETTAGE OF UTERUS N/A 08/13/2018   ECTOPIC PREGNANCY SURGERY  1981   OPEN SURGICAL REPAIR OF GLUTEAL TENDON Right 07/02/2023   Procedure: REPAIR, TENDON, GLUTEUS MEDIUS, OPEN;  Surgeon: Genelle Standing, MD;  Location: [facility];  Service: Orthopedics;  Laterality: Right;  RIGHT GLUTEUS MAXIMUS TENDON TRANSFER WITH COLLAGEN PATCH AUGMENTATION   PARATHYROIDECTOMY Left 09/17/2008   dr jeoffrey dawn  @[facility]    left [facility]  (adenoma)   REDUCTION MAMMAPLASTY Bilateral 2000   years ago   SHOULDER SURGERY Right 2007   frozen   UPPER GASTROINTESTINAL ENDOSCOPY  last one 12-18-2017   dr wilhelmenia   Patient Active Problem List   Diagnosis Date Noted   Tendinopathy of gluteus medius 07/02/2023   Greater trochanteric bursitis of right hip 04/17/2022   C. difficile diarrhea 03/08/2022   Arthritis of  right sacroiliac joint (HCC) 02/26/2022   Encounter for monitoring long-term proton pump inhibitor therapy 07/05/2021   Hx of colonic polyps 06/06/2021   Incontinence of feces with fecal urgency 06/06/2021   Sleep disorder 11/29/2020   Benign paroxysmal positional vertigo of right ear 08/30/2020   AC (acromioclavicular) arthritis 10/22/2019   Adhesive bursitis of left shoulder 07/22/2019   GERD with esophagitis 12/30/2017   Hyperlipidemia 05/15/2017   Morbid obesity (HCC) 11/12/2016   Essential hypertension, benign 10/06/2014   Depression with anxiety 10/06/2014   Stress incontinence 10/06/2014   Osteoporosis 10/06/2014   Degenerative disc disease, lumbar 09/09/2012    Spinal stenosis of lumbar region 09/09/2012    PCP: Charlies Bellini, DO  REFERRING PROVIDER: Elspeth Parker, MD  REFERRING DIAG: post-op REPAIR, TENDON, GLUTEUS MEDIUS, OPEN (Right) - RIGHT GLUTEUS MAXIMUS TENDON TRANSFER WITH COLLAGEN PATCH AUGMENTATION  Rationale for Evaluation and Treatment: Rehabilitation  THERAPY DIAG:  Muscle weakness (generalized)  Difficulty in walking, not elsewhere classified  Pain in right hip  ONSET DATE: 07/02/23 surgery date - chronic pain and weakness prior to surgery  SUBJECTIVE:                                                                                                                                                                                           SUBJECTIVE STATEMENT: Had some soreness in left QL after last time but it's better today.     PAIN:   Are you having pain? Yes NPRS scale: 0 no pain  Pain location: Rt lateral hip Pain orientation: Right  PAIN TYPE: aching Pain description: intermittent, dull, and aching  Aggravating factors: laying on Rt side, in/out of car, walking Relieving factors: sitting   PRECAUTIONS: Other: protocol from Dr. Parker scanned into media section of chart - avoid until 6 weeks post-op which is 08/13/23: passive ADDuction and ER, active ABDuction and IR  RED FLAGS: None   WEIGHT BEARING RESTRICTIONS: PWB initially, doctor just cleared to get rid of walking stick when ready  FALLS:  Has patient fallen in last 6 months? Yes. Number of falls 1, while carrying a planter and going up stairs  LIVING ENVIRONMENT: Lives with: lives with their spouse Lives in: House/apartment Stairs: Yes: External: 3 steps; on left going up Has following equipment at home: Single point cane  OCCUPATION: retired  PLOF: Independent  PATIENT GOALS: walk without pain and walking stick, can I get back to pickleball?  NEXT MD VISIT: 3 weeks  OBJECTIVE:  Note: Objective measures were completed at Evaluation unless  otherwise noted.  DIAGNOSTIC FINDINGS:  Xray (3 views right hip): No femoral acetabular osteoarthritis  MRI (right hip): There is an end-stage full-thickness tear of the gluteus medius with significant atrophy on T1 coronal view  PATIENT SURVEYS:  LEFS: 44/80 7/16: LEFS  42/80 9/4: 32/80  COGNITION: Overall cognitive status: Within functional limits for tasks assessed     SENSATION: WFL  MUSCLE LENGTH:   POSTURE: flexed trunk   PALPATION: Mild tenderness around incision - steri strips placed today after stitches were removed  LUMBAR ROM:  WFL  LOWER EXTREMITY ROM:    Pt able to perform seated march A/ROM and quadruped rocking for hip flexion beyond 90 deg without pain  LOWER EXTREMITY MMT:   Lt LE 4+/5 Rt hip at least 3+/5 - isometric testing only, resisted isometrics strong and painfree   FUNCTIONAL TESTS:  Eval: 5 times sit to stand: 13.23 Timed up and go (TUG): 12.24 using walking stick 2 MWT: 315 feet using single walking stick in Lt UE  08/01/2023: 6 minute walk test:  759 ft with [facility] 08/07/23: 6 minute walk test: 781' with [facility] 7/15: 5x STS with hands: 10.40 5x STS no hands 11.19 TUG: with cane: 9.29 sec  09/05/23: 6 MWT 1010 feet with SPC 5x STS no hands 9.94     GAIT: Distance walked: 315' in 2 min  Assistive device utilized: single walking stick Level of assistance: Modified independence Comments: Rt early stance phase Trendelenburg  TREATMENT DATE: 10/03/23 Nu-Step L3 5 min discussed pt status LEFS Pt defers 6 MWT today WB on right with 2 step taps 10x  4 inch right step ups 10x  4 inch right lateral step ups 10x  2 inch step downs 10x bil UE assisted Seated green band HS curls 15x right/left 5# cable pulley trunk rotation and weight shift 12 right/left (like a golf swing)  Agility/dynamic balance: step multi directions to circles with UE reach as if using pickleball paddle  Hip matrix extension 40#: 10x right/left Leg press seat 6 80#  bil 15x; 40# single leg 15x right/left Seated blue ball roll outs to stretch lumbar spine  10/01/23 Bike L1 5 min discussed pt status WB on right with 2 step taps 8x  6 inch right step ups 10x  6 inch right lateral step ups 7x (challenging)  Seated 5# resting on knee: hip flexion/abduction over the line 15x Staggered stance left hand holding 5# KB reach down toward  Standing on right LE: holding 5# KB with left hip flexion 2 sets of 5 Resisted walking with belt: 10# forward and reverse x5 reps with CGA by PT for safety Leg press seat 6 80# bil 15x; 40# single leg 15x right/left Supine with black band anchored on foot resisted hip extension 10x2  09/24/23 Nu-Step L5 6 min - PT student monitored and discussed pt status Resisted walking: 10# forward and reverse x5 reps with CGA by PT for safety 4 inch forward step up 2x10 right WB on right with 4 inch step taps 2x10 (challenging) 4 inch lateral step ups with opposite hip abduction 10x (needs heavy UE support on right) Sit to stand: blue band around thighs 2x10 Sidestepping at barre: blue loop around thighs x4 laps  Leg press seat 6 80# bil 12x; 40# single leg 10x right/left (increased from last visit)  Forward T with UE support on barre x12   09/13/23 Nu-Step L1 5 min - PT student monitored and discussed pt status Cable single handle 5# trunk rotation 10x right/left Cable single handle 5# 3 side steps 6x right/left close supervision for safety  Offset Bil heel raises (more weight on surgery leg) 10x 4 inch forward step up 10x right WB on right with 4 inch step taps 8x (challenging) 4 inch lateral step ups with opposite hip abduction 10x (needs heavy UE support on right) Leg press seat 6 80# bil 12x; 40# single leg 10x right/left (increased from last visit)  Matrix abduction  30# x10 (very difficult with left leg abduction which means WB on right)  Matrix extension bilaterally 30# x10- back pain when WB on right  Seated ball roll outs to  stretch low back    09/10/23 Nu-Step L1 5 min - PT student monitored and discussed pt status Bil heel raises 10x Bil heel raises with ball between ankles 2x10- for Post tib strengthening Supine posterior pelvic tilt 2x10-  neuromuscular reeducation to promote lumbopelvic stability  Supine hip ER fall outs 2x10 with red loop  (increase resistance next visit) Matrix abd right leg moving leg 35# 2x10-this was difficult Matrix extension bilaterally 35# 2x10- this was challenging  4 inch forward step up 10x  4 inch lateral step up 10x-lateral was easier for her than front/back Leg press seat 6 75lb  2x10, single leg 35# 2x10 right/left Open books in side lying x10 each side- she liked this   PATIENT EDUCATION:  Education details: [email or web address] Person educated: Patient Education method: Explanation, Demonstration, Verbal cues, and Handouts Education comprehension: verbalized understanding and returned demonstration  HOME EXERCISE PROGRAM: Access Code: [email or web address] URL: [email or web address] Date: 07/12/2023 Prepared by: Orvil Beuhring  Exercises - Supine Posterior Pelvic Tilt  - 2 x daily - 7 x weekly - 2 sets - 10 reps - Supine Short Arc Quad  - 2 x daily - 7 x weekly - 2 sets - 10 reps - Supine Hip Adduction Isometric with Ball  - 2 x daily - 7 x weekly - 2 sets - 10 reps - 5 hold - Quadruped Rocking Backward  - 2 x daily - 7 x weekly - 1-2 sets - 10 reps - 5 hold - Seated Isometric Hip Abduction  - 2 x daily - 7 x weekly - 2 sets - 10 reps - 5 hold - Standing Knee Flexion AROM with Chair Support  - 2 x daily - 7 x weekly - 2 sets - 10 reps  ASSESSMENT:  CLINICAL IMPRESSION: Gluteal musculature (particularly medius) continues to be weak with quick muscular fatigue with single leg standing, hip abduction and lateral step ups.  Weakness causes a pelvic drop and altered gait pattern without her cane and affects speed of movement and change of direction.  Therapist monitoring  response and providing verbal cues for technique.     OBJECTIVE IMPAIRMENTS: Abnormal gait, decreased balance, decreased coordination, difficulty walking, decreased strength, increased edema, impaired flexibility, improper body mechanics, postural dysfunction, and pain.   ACTIVITY LIMITATIONS: lifting, bending, squatting, stairs, and locomotion level  PARTICIPATION LIMITATIONS: cleaning, laundry, shopping, community activity, and yard work  PERSONAL FACTORS: Age are also affecting patient's functional outcome.   REHAB POTENTIAL: Excellent  CLINICAL DECISION MAKING: Evolving/moderate complexity  EVALUATION COMPLEXITY: Moderate   GOALS: Goals reviewed with patient? Yes  SHORT TERM GOALS: Target date: 08/09/23  Pt will be ind with initial HEP Baseline: Goal status: Met on 08/01/23  2.  Pt will be educated on movements to avoid to follow post-op protocol to protect tendon transfer site Baseline:  Goal status: MET  3.  Pt will demo improved Rt hip control in stance phase of gait with single walking  stick as needed. Baseline:  Goal status: MET 08/07/23    LONG TERM GOALS: Target date: 10/31/2023   Pt will be ind with advanced HEP Baseline:  Goal status: ongoing  2.  Pt will improve LEFS score to at least 55/80 to demo improved function Baseline: 44/80 Goal status: ongoing  3.  Pt will perform 6 min walk test covering at least 700' with walking stick as needed Baseline:  Goal status: MET 08/07/23 with walking stick  4.  Pt will be able to demo squat, lift 10lb, carry 10lb and walk to mimic household chores and yard activities with proper body mechanics without pain. Baseline:  Goal status: ongoing  5.  Pt will improve glut strength to at least 4+/5 to improve gait mechanics, stairs and transfers. Baseline:  Goal status: Progressing   6.  5x STS improved to 11 sec or less to demo improved functional strength and reduce fall risk. Baseline: 13.23 Goal status:  ongoing  PLAN:  PT FREQUENCY: 2x/week  PT DURATION: 8 weeks  PLANNED INTERVENTIONS: 97110-Therapeutic exercises, 97530- Therapeutic activity, [id number]- Neuromuscular re-education, 97535- Self Care, [id number]- Manual therapy, [phone]- Gait training, Patient/Family education, Balance training, Stair training, Cryotherapy, and Moist heat.  PLAN FOR NEXT SESSION:  hip machine; leg press; unilateral cable column rotations, step downs, side stepping with band;  gluteal muscle activation; hip mobilizations; Continue to work on gait endurance, Hip stability exercises, weight bearing  Glade Pesa, PT 10/03/23 6:25 PM Phone: [phone] Fax: [phone]  [facility] [address] [place], [place] [address] Phone # [phone] Fax [phone]

## 2023-10-08 ENCOUNTER — Ambulatory Visit

## 2023-10-08 DIAGNOSIS — M6281 Muscle weakness (generalized): Secondary | ICD-10-CM

## 2023-10-08 DIAGNOSIS — M25551 Pain in right hip: Secondary | ICD-10-CM

## 2023-10-08 DIAGNOSIS — R252 Cramp and spasm: Secondary | ICD-10-CM

## 2023-10-08 DIAGNOSIS — R293 Abnormal posture: Secondary | ICD-10-CM

## 2023-10-08 DIAGNOSIS — R2689 Other abnormalities of gait and mobility: Secondary | ICD-10-CM

## 2023-10-08 DIAGNOSIS — R262 Difficulty in walking, not elsewhere classified: Secondary | ICD-10-CM

## 2023-10-08 NOTE — Therapy (Signed)
 OUTPATIENT PHYSICAL THERAPY TREATMENT    Patient Name: Rebecca Long MRN: [id number] DOB:1950-07-30, 74 y.o., female Today's Date: 10/08/2023      END OF SESSION:  PT End of Session - 10/08/23 1234     Visit Number 21    Date for PT Re-Evaluation 10/31/23    Authorization Type HTA no auth required    Progress Note Due on Visit 30    PT Start Time 1147    PT Stop Time 1230    PT Time Calculation (min) 43 min    Activity Tolerance Patient tolerated treatment well    Behavior During Therapy WFL for tasks assessed/performed             Past Medical History:  Diagnosis Date   Anxiety    Cavus foot, acquired 09/09/2012    bilateral cavus feet the one on the left shows that she has some probable neurogenic weakness with abnormal curvature and in inversion contracture Podiatry 06/2015: H&P and x-ray reviewed with patient. Today I went ahead and I did a proximal nerve block I was able to aspirate the second MPJ and got out amount of clear fluid and injected with a quarter cc dexamethasone  Kenalog  and ap   DDD (degenerative disc disease), lumbar    Depression    Endometrial polyp    GERD (gastroesophageal reflux disease)    Hiatal hernia    Hip flexor tendinitis, right 07/02/2016   History of adenomatous polyp of colon    History of esophageal stricture    History of gastric polyp 11/2017   History of PCR DNA positive for HSV2 11/12/2016   History of primary hyperparathyroidism    s/p  left parathryoidectomy 09-17-2008-- resolved   Hyperlipidemia    Hypertension    Insomnia    Lumbar stenosis    Osteopenia    PONV (postoperative nausea and vomiting)    Schatzki's ring    Scoliosis    Tendinopathy of right biceps tendon 07/02/2016   Thickened endometrium    Uterine fibroid    UTI (urinary tract infection) due to Enterococcus 01/05/2020   Wears glasses    Past Surgical History:  Procedure Laterality Date   BREAST REDUCTION SURGERY Bilateral 2000   COLONOSCOPY     CYST  EXCISION  1985   head and rt axilla   DILATATION & CURETTAGE/HYSTEROSCOPY WITH MYOSURE N/A 08/13/2018   Procedure: DILATATION & CURETTAGE/HYSTEROSCOPY WITH MYOSURE;  Surgeon: Lavoie, Marie-Lyne, MD;  Location: [facility];  Service: Gynecology;  Laterality: N/A;  requests 10:15am in [place] Gyn block request 30 minutes   DILATION AND CURETTAGE OF UTERUS N/A 08/13/2018   ECTOPIC PREGNANCY SURGERY  1981   OPEN SURGICAL REPAIR OF GLUTEAL TENDON Right 07/02/2023   Procedure: REPAIR, TENDON, GLUTEUS MEDIUS, OPEN;  Surgeon: Genelle Standing, MD;  Location: [facility];  Service: Orthopedics;  Laterality: Right;  RIGHT GLUTEUS MAXIMUS TENDON TRANSFER WITH COLLAGEN PATCH AUGMENTATION   PARATHYROIDECTOMY Left 09/17/2008   dr jeoffrey dawn  @[facility]    left superior  (adenoma)   REDUCTION MAMMAPLASTY Bilateral 2000   years ago   SHOULDER SURGERY Right 2007   frozen   UPPER GASTROINTESTINAL ENDOSCOPY  last one 12-18-2017   dr wilhel[place]ia   Patient Active Problem List   Diagnosis Date Noted   Tendinopathy of gluteus medius 07/02/2023   Greater trochanteric bursitis of right hip 04/17/2022   C. difficile diarrhea 03/08/2022   Arthritis of right sacroiliac joint (HCC) 02/26/2022   Encounter for  monitoring long-term proton pump inhibitor therapy 07/05/2021   Hx of colonic polyps 06/06/2021   Incontinence of feces with fecal urgency 06/06/2021   Sleep disorder 11/29/2020   Benign paroxysmal positional vertigo of right ear 08/30/2020   AC (acromioclavicular) arthritis 10/22/2019   Adhesive bursitis of left shoulder 07/22/2019   GERD with esophagitis 12/30/2017   Hyperlipidemia 05/15/2017   Morbid obesity (HCC) 11/12/2016   Essential hypertension, benign 10/06/2014   Depression with anxiety 10/06/2014   Stress incontinence 10/06/2014   Osteoporosis 10/06/2014   Degenerative disc disease, lumbar 09/09/2012   Spinal stenosis of lumbar region 09/09/2012    PCP: Charlies Bellini,  DO  REFERRING PROVIDER: Elspeth Parker, MD  REFERRING DIAG: post-op REPAIR, TENDON, GLUTEUS MEDIUS, OPEN (Right) - RIGHT GLUTEUS MAXIMUS TENDON TRANSFER WITH COLLAGEN PATCH AUGMENTATION  Rationale for Evaluation and Treatment: Rehabilitation  THERAPY DIAG:  Muscle weakness (generalized)  Difficulty in walking, not elsewhere classified  Pain in right hip  Cramp and spasm  Other abnormalities of gait and mobility  Abnormal posture  ONSET DATE: 07/02/23 surgery date - chronic pain and weakness prior to surgery  SUBJECTIVE:                                                                                                                                                                                           SUBJECTIVE STATEMENT: I'm doing ok.  I still need my cane for stability.    PAIN:   Are you having pain? Yes NPRS scale: 0 no pain  Pain location: Rt lateral hip Pain orientation: Right  PAIN TYPE: aching Pain description: intermittent, dull, and aching  Aggravating factors: laying on Rt side, in/out of car, walking Relieving factors: sitting   PRECAUTIONS: Other: protocol from Dr. Parker scanned into media section of chart - avoid until 6 weeks post-op which is 08/13/23: passive ADDuction and ER, active ABDuction and IR  RED FLAGS: None   WEIGHT BEARING RESTRICTIONS: PWB initially, doctor just cleared to get rid of walking stick when ready  FALLS:  Has patient fallen in last 6 months? Yes. Number of falls 1, while carrying a planter and going up stairs  LIVING ENVIRONMENT: Lives with: lives with their spouse Lives in: House/apartment Stairs: Yes: External: 3 steps; on left going up Has following equipment at home: Single point cane  OCCUPATION: retired  PLOF: Independent  PATIENT GOALS: walk without pain and walking stick, can I get back to pickleball?  NEXT MD VISIT: 3 weeks  OBJECTIVE:  Note: Objective measures were completed at Evaluation unless  otherwise noted.  DIAGNOSTIC FINDINGS:  Xray (3 views right hip): No femoral acetabular  osteoarthritis   MRI (right hip): There is an end-stage full-thickness tear of the gluteus medius with significant atrophy on T1 coronal view  PATIENT SURVEYS:  LEFS: 44/80 7/16: LEFS  42/80 9/4: 32/80  COGNITION: Overall cognitive status: Within functional limits for tasks assessed     SENSATION: WFL  MUSCLE LENGTH:   POSTURE: flexed trunk   PALPATION: Mild tenderness around incision - steri strips placed today after stitches were removed  LUMBAR ROM:  WFL  LOWER EXTREMITY ROM:    Pt able to perform seated march A/ROM and quadruped rocking for hip flexion beyond 90 deg without pain  LOWER EXTREMITY MMT:   Lt LE 4+/5 Rt hip at least 3+/5 - isometric testing only, resisted isometrics strong and painfree   FUNCTIONAL TESTS:  Eval: 5 times sit to stand: 13.23 Timed up and go (TUG): 12.24 using walking stick 2 MWT: 315 feet using single walking stick in Lt UE  08/01/2023: 6 minute walk test:  759 ft with [facility] 08/07/23: 6 minute walk test: 781' with [facility] 7/15: 5x STS with hands: 10.40 5x STS no hands 11.19 TUG: with cane: 9.29 sec  09/05/23: 6 MWT 1010 feet with SPC 5x STS no hands 9.94 GAIT: Distance walked: 315' in 2 min  Assistive device utilized: single walking stick Level of assistance: Modified independence Comments: Rt early stance phase Trendelenburg  TREATMENT DATE: 10/08/23 Bike: level 3x 5 minutes- PT present to discuss progress  WB on right star lunges on Lt 2x5 with slider 4 inch right step ups 15x- min UE support  4 inch right lateral step ups 15x  2 inch step downs Rt with 1 UE support x20 Seated green band HS curls 15x right/left 5# cable pulley trunk rotation and weight shift 12 right/left (like a golf swing)  Step up on balance pad: Rt with stabilization at top x15 Leg press seat 6 80# bil x20; 45# single leg 2x10 right/left Seated blue ball roll outs to  stretch lumbar spine   10/03/23 Nu-Step L3 5 min discussed pt status LEFS Pt defers 6 MWT today WB on right with 2 step taps 10x  4 inch right step ups 10x  4 inch right lateral step ups 10x  2 inch step downs 10x bil UE assisted Seated green band HS curls 15x right/left 5# cable pulley trunk rotation and weight shift 12 right/left (like a golf swing)  Agility/dynamic balance: step multi directions to circles with UE reach as if using pickleball paddle  Hip matrix extension 40#: 10x right/left Leg press seat 6 80# bil 15x; 40# single leg 15x right/left Seated blue ball roll outs to stretch lumbar spine  10/01/23 Bike L1 5 min discussed pt status WB on right with 2 step taps 8x  6 inch right step ups 10x  6 inch right lateral step ups 7x (challenging)  Seated 5# resting on knee: hip flexion/abduction over the line 15x Staggered stance left hand holding 5# KB reach down toward  Standing on right LE: holding 5# KB with left hip flexion 2 sets of 5 Resisted walking with belt: 10# forward and reverse x5 reps with CGA by PT for safety Leg press seat 6 80# bil 15x; 40# single leg 15x right/left Supine with black band anchored on foot resisted hip extension 10x2   PATIENT EDUCATION:  Education details: [email or web address] Person educated: Patient Education method: Explanation, Demonstration, Verbal cues, and Handouts Education comprehension: verbalized understanding and returned demonstration  HOME EXERCISE PROGRAM: Access Code: [email or web address] URL: [email or web address]  Date: 07/12/2023 Prepared by: Orvil Beuhring  Exercises - Supine Posterior Pelvic Tilt  - 2 x daily - 7 x weekly - 2 sets - 10 reps - Supine Short Arc Quad  - 2 x daily - 7 x weekly - 2 sets - 10 reps - Supine Hip Adduction Isometric with Ball  - 2 x daily - 7 x weekly - 2 sets - 10 reps - 5 hold - Quadruped Rocking Backward  - 2 x daily - 7 x weekly - 1-2 sets - 10 reps - 5 hold - Seated Isometric Hip Abduction  -  2 x daily - 7 x weekly - 2 sets - 10 reps - 5 hold - Standing Knee Flexion AROM with Chair Support  - 2 x daily - 7 x weekly - 2 sets - 10 reps  ASSESSMENT:  CLINICAL IMPRESSION: Pt continues to use standard cane for all ambulation. Pt did well with advanced single leg weight with leg press.  She continues to demonstrate gluteal and pelvic functional weakness with gait and is challenged with stability exercises.  She requires less UE support with eccentric activity and stabilization today.  PT monitored throughout session for cueing and to monitor for fatigue with exercise.    OBJECTIVE IMPAIRMENTS: Abnormal gait, decreased balance, decreased coordination, difficulty walking, decreased strength, increased edema, impaired flexibility, improper body mechanics, postural dysfunction, and pain.   ACTIVITY LIMITATIONS: lifting, bending, squatting, stairs, and locomotion level  PARTICIPATION LIMITATIONS: cleaning, laundry, shopping, community activity, and yard work  PERSONAL FACTORS: Age are also affecting patient's functional outcome.   REHAB POTENTIAL: Excellent  CLINICAL DECISION MAKING: Evolving/moderate complexity  EVALUATION COMPLEXITY: Moderate   GOALS: Goals reviewed with patient? Yes  SHORT TERM GOALS: Target date: 08/09/23  Pt will be ind with initial HEP Baseline: Goal status: Met on 08/01/23  2.  Pt will be educated on movements to avoid to follow post-op protocol to protect tendon transfer site Baseline:  Goal status: MET  3.  Pt will demo improved Rt hip control in stance phase of gait with single walking stick as needed. Baseline:  Goal status: MET 08/07/23    LONG TERM GOALS: Target date: 10/31/2023   Pt will be ind with advanced HEP Baseline:  Goal status: ongoing  2.  Pt will improve LEFS score to at least 55/80 to demo improved function Baseline: 44/80 Goal status: ongoing  3.  Pt will perform 6 min walk test covering at least 700' with walking stick as  needed Baseline:  Goal status: MET 08/07/23 with walking stick  4.  Pt will be able to demo squat, lift 10lb, carry 10lb and walk to mimic household chores and yard activities with proper body mechanics without pain. Baseline:  Goal status: ongoing  5.  Pt will improve glut strength to at least 4+/5 to improve gait mechanics, stairs and transfers. Baseline:  Goal status: Progressing   6.  5x STS improved to 11 sec or less to demo improved functional strength and reduce fall risk. Baseline: 13.23 Goal status: ongoing  PLAN:  PT FREQUENCY: 2x/week  PT DURATION: 8 weeks  PLANNED INTERVENTIONS: 97110-Therapeutic exercises, 97530- Therapeutic activity, [id number]- Neuromuscular re-education, 97535- Self Care, [id number]- Manual therapy, [phone]- Gait training, Patient/Family education, Balance training, Stair training, Cryotherapy, and Moist heat.  PLAN FOR NEXT SESSION:  hip machine; leg press; unilateral cable column rotations, step downs, side stepping with band;  gluteal muscle activation; hip mobilizations; Continue to work on gait endurance, Hip stability exercises, weight bearing  Burnard Joy, PT 10/08/23 12:36 PM   [facility] [address], [address] [place], [place] [address] Phone # [phone] Fax [phone]

## 2023-10-09 ENCOUNTER — Telehealth: Payer: Self-pay

## 2023-10-09 MED ORDER — MNEXSPIKE 10 MCG/0.2ML IM SUSY: 10.0000 ug | PREFILLED_SYRINGE | Freq: Once | INTRAMUSCULAR | 0 refills | Status: AC

## 2023-10-09 NOTE — Addendum Note (Signed)
 Addended by: Macala Baldonado A on: 10/09/2023 04:19 PM   Modules accepted: Orders

## 2023-10-09 NOTE — Telephone Encounter (Addendum)
 Prescription for COVID vaccines sent

## 2023-10-09 NOTE — Telephone Encounter (Signed)
 Communication  Reason for CRM: Patient would like to have prescription for covid vaccine sent to the pharmacy :            [id number] - [place], [place] - [address]    Phone: [phone]    Fax: [phone]    Please advise.

## 2023-10-10 ENCOUNTER — Ambulatory Visit: Admitting: Physical Therapy

## 2023-10-10 DIAGNOSIS — M6281 Muscle weakness (generalized): Secondary | ICD-10-CM | POA: Diagnosis not present

## 2023-10-10 DIAGNOSIS — R262 Difficulty in walking, not elsewhere classified: Secondary | ICD-10-CM

## 2023-10-10 DIAGNOSIS — M25551 Pain in right hip: Secondary | ICD-10-CM

## 2023-10-10 NOTE — Therapy (Signed)
 OUTPATIENT PHYSICAL THERAPY TREATMENT    Patient Name: Rebecca Long MRN: [id number] DOB:1950-12-10, 73 y.o., female Today's Date: 10/10/2023      END OF SESSION:  PT End of Session - 10/10/23 1154     Visit Number 22    Date for PT Re-Evaluation 10/31/23    Authorization Type HTA no auth required    Progress Note Due on Visit 30    PT Start Time 1145    PT Stop Time 1230    PT Time Calculation (min) 45 min    Activity Tolerance Patient tolerated treatment well             Past Medical History:  Diagnosis Date   Anxiety    Cavus foot, acquired 09/09/2012    bilateral cavus feet the one on the left shows that she has some probable neurogenic weakness with abnormal curvature and in inversion contracture Podiatry 06/2015: H&P and x-ray reviewed with patient. Today I went ahead and I did a proximal nerve block I was able to aspirate the second MPJ and got out amount of clear fluid and injected with a quarter cc dexamethasone  Kenalog  and ap   DDD (degenerative disc disease), lumbar    Depression    Endometrial polyp    GERD (gastroesophageal reflux disease)    Hiatal hernia    Hip flexor tendinitis, right 07/02/2016   History of adenomatous polyp of colon    History of esophageal stricture    History of gastric polyp 11/2017   History of PCR DNA positive for HSV2 11/12/2016   History of primary hyperparathyroidism    s/p  left parathryoidectomy 09-17-2008-- resolved   Hyperlipidemia    Hypertension    Insomnia    Lumbar stenosis    Osteopenia    PONV (postoperative nausea and vomiting)    Schatzki's ring    Scoliosis    Tendinopathy of right biceps tendon 07/02/2016   Thickened endometrium    Uterine fibroid    UTI (urinary tract infection) due to Enterococcus 01/05/2020   Wears glasses    Past Surgical History:  Procedure Laterality Date   BREAST REDUCTION SURGERY Bilateral 2000   COLONOSCOPY     CYST EXCISION  1985   head and rt axilla   DILATATION &  CURETTAGE/HYSTEROSCOPY WITH MYOSURE N/A 08/13/2018   Procedure: DILATATION & CURETTAGE/HYSTEROSCOPY WITH MYOSURE;  Surgeon: Lavoie, Marie-Lyne, MD;  Location: [facility];  Service: Gynecology;  Laterality: N/A;  requests 10:15am in [place] Gyn block request 30 minutes   DILATION AND CURETTAGE OF UTERUS N/A 08/13/2018   ECTOPIC PREGNANCY SURGERY  1981   OPEN SURGICAL REPAIR OF GLUTEAL TENDON Right 07/02/2023   Procedure: REPAIR, TENDON, GLUTEUS MEDIUS, OPEN;  Surgeon: Genelle Standing, MD;  Location: [facility];  Service: Orthopedics;  Laterality: Right;  RIGHT GLUTEUS MAXIMUS TENDON TRANSFER WITH COLLAGEN PATCH AUGMENTATION   PARATHYROIDECTOMY Left 09/17/2008   dr jeoffrey dawn  @[facility]    left superior  (adenoma)   REDUCTION MAMMAPLASTY Bilateral 2000   years ago   SHOULDER SURGERY Right 2007   frozen   UPPER GASTROINTESTINAL ENDOSCOPY  last one 12-18-2017   dr wilhelmenia   Patient Active Problem List   Diagnosis Date Noted   Tendinopathy of gluteus medius 07/02/2023   Greater trochanteric bursitis of right hip 04/17/2022   C. difficile diarrhea 03/08/2022   Arthritis of right sacroiliac joint (HCC) 02/26/2022   Encounter for monitoring long-term proton pump inhibitor therapy 07/05/2021   Hx  of colonic polyps 06/06/2021   Incontinence of feces with fecal urgency 06/06/2021   Sleep disorder 11/29/2020   Benign paroxysmal positional vertigo of right ear 08/30/2020   AC (acromioclavicular) arthritis 10/22/2019   Adhesive bursitis of left shoulder 07/22/2019   GERD with esophagitis 12/30/2017   Hyperlipidemia 05/15/2017   Morbid obesity (HCC) 11/12/2016   Essential hypertension, benign 10/06/2014   Depression with anxiety 10/06/2014   Stress incontinence 10/06/2014   Osteoporosis 10/06/2014   Degenerative disc disease, lumbar 09/09/2012   Spinal stenosis of lumbar region 09/09/2012    PCP: Charlies Bellini, DO  REFERRING PROVIDER: Elspeth Parker,  MD  REFERRING DIAG: post-op REPAIR, TENDON, GLUTEUS MEDIUS, OPEN (Right) - RIGHT GLUTEUS MAXIMUS TENDON TRANSFER WITH COLLAGEN PATCH AUGMENTATION  Rationale for Evaluation and Treatment: Rehabilitation  THERAPY DIAG:  Muscle weakness (generalized)  Difficulty in walking, not elsewhere classified  Pain in right hip  ONSET DATE: 07/02/23 surgery date - chronic pain and weakness prior to surgery  SUBJECTIVE:                                                                                                                                                                                           SUBJECTIVE STATEMENT: Denies soreness after last visit.  No pain but continues to need her cane secondary to weakness.   PAIN:   Are you having pain? Yes NPRS scale: 0 no pain  Pain location: Rt lateral hip Pain orientation: Right  PAIN TYPE: aching Pain description: intermittent, dull, and aching  Aggravating factors: laying on Rt side, in/out of car, walking Relieving factors: sitting   PRECAUTIONS: Other: protocol from Dr. Parker scanned into media section of chart - avoid until 6 weeks post-op which is 08/13/23: passive ADDuction and ER, active ABDuction and IR  RED FLAGS: None   WEIGHT BEARING RESTRICTIONS: PWB initially, doctor just cleared to get rid of walking stick when ready  FALLS:  Has patient fallen in last 6 months? Yes. Number of falls 1, while carrying a planter and going up stairs  LIVING ENVIRONMENT: Lives with: lives with their spouse Lives in: House/apartment Stairs: Yes: External: 3 steps; on left going up Has following equipment at home: Single point cane  OCCUPATION: retired  PLOF: Independent  PATIENT GOALS: walk without pain and walking stick, can I get back to pickleball?  NEXT MD VISIT: 3 weeks  OBJECTIVE:  Note: Objective measures were completed at Evaluation unless otherwise noted.  DIAGNOSTIC FINDINGS:  Xray (3 views right hip): No femoral  acetabular osteoarthritis   MRI (right hip): There is an end-stage full-thickness tear of the gluteus medius with significant atrophy  on T1 coronal view  PATIENT SURVEYS:  LEFS: 44/80 7/16: LEFS  42/80 9/4: 32/80  COGNITION: Overall cognitive status: Within functional limits for tasks assessed     SENSATION: WFL  MUSCLE LENGTH:   POSTURE: flexed trunk   PALPATION: Mild tenderness around incision - steri strips placed today after stitches were removed  LUMBAR ROM:  WFL  LOWER EXTREMITY ROM:    Pt able to perform seated march A/ROM and quadruped rocking for hip flexion beyond 90 deg without pain  LOWER EXTREMITY MMT:   Lt LE 4+/5 Rt hip at least 3+/5 - isometric testing only, resisted isometrics strong and painfree   FUNCTIONAL TESTS:  Eval: 5 times sit to stand: 13.23 Timed up and go (TUG): 12.24 using walking stick 2 MWT: 315 feet using single walking stick in Lt UE  08/01/2023: 6 minute walk test:  759 ft with [facility] 08/07/23: 6 minute walk test: 781' with [facility] 7/15: 5x STS with hands: 10.40 5x STS no hands 11.19 TUG: with cane: 9.29 sec  09/05/23: 6 MWT 1010 feet with SPC 5x STS no hands 9.94 GAIT: Distance walked: 315' in 2 min  Assistive device utilized: single walking stick Level of assistance: Modified independence Comments: Rt early stance phase Trendelenburg  TREATMENT DATE: 10/08/23 Bike: level 3x 5 minutes- PT present to discuss progress  Standing Side plank elbow on tall table 10x right/left Front planks on elbows on tall table 5 sec 10x Front plank elbows on tall table hip circles Supine hip thrusts with head of bed elevated 8# weight in crease of hips 2x 10  Side bridge/plank 5x (challenging) on other side with clam 5x Prone modified plank elbows and knees 5x 5 sec holds Leg press seat 6 80# bil x20; 45# single leg 2x10 right/left 6 inch step ups 2 hand support needed 10x Education on glute medius location using diagrams    10/03/23 Nu-Step L3  5 min discussed pt status LEFS Pt defers 6 MWT today WB on right with 2 step taps 10x  4 inch right step ups 10x  4 inch right lateral step ups 10x  2 inch step downs 10x bil UE assisted Seated green band HS curls 15x right/left 5# cable pulley trunk rotation and weight shift 12 right/left (like a golf swing)  Agility/dynamic balance: step multi directions to circles with UE reach as if using pickleball paddle  Hip matrix extension 40#: 10x right/left Leg press seat 6 80# bil 15x; 40# single leg 15x right/left Seated blue ball roll outs to stretch lumbar spine  10/01/23 Bike L1 5 min discussed pt status WB on right with 2 step taps 8x  6 inch right step ups 10x  6 inch right lateral step ups 7x (challenging)  Seated 5# resting on knee: hip flexion/abduction over the line 15x Staggered stance left hand holding 5# KB reach down toward  Standing on right LE: holding 5# KB with left hip flexion 2 sets of 5 Resisted walking with belt: 10# forward and reverse x5 reps with CGA by PT for safety Leg press seat 6 80# bil 15x; 40# single leg 15x right/left Supine with black band anchored on foot resisted hip extension 10x2   PATIENT EDUCATION:  Education details: [email or web address] Person educated: Patient Education method: Explanation, Demonstration, Verbal cues, and Handouts Education comprehension: verbalized understanding and returned demonstration  HOME EXERCISE PROGRAM: Access Code: [email or web address] URL: [email or web address] Date: 07/12/2023 Prepared by: Orvil Beuhring  Exercises - Supine Posterior Pelvic Tilt  - 2 x daily -  7 x weekly - 2 sets - 10 reps - Supine Short Arc Quad  - 2 x daily - 7 x weekly - 2 sets - 10 reps - Supine Hip Adduction Isometric with Ball  - 2 x daily - 7 x weekly - 2 sets - 10 reps - 5 hold - Quadruped Rocking Backward  - 2 x daily - 7 x weekly - 1-2 sets - 10 reps - 5 hold - Seated Isometric Hip Abduction  - 2 x daily - 7 x weekly - 2 sets - 10 reps - 5  hold - Standing Knee Flexion AROM with Chair Support  - 2 x daily - 7 x weekly - 2 sets - 10 reps  ASSESSMENT:  CLINICAL IMPRESSION: Neuromuscular re-ed to activate gluteals in a variety of positions weight bearing and non-weight bearing.  Verbal cues for activation of targeted muscles without compensations.  Interventions performed to improve strength and balance needed for prolonged standing,  community distance walking and safe negotiation of curbs and steps.      OBJECTIVE IMPAIRMENTS: Abnormal gait, decreased balance, decreased coordination, difficulty walking, decreased strength, increased edema, impaired flexibility, improper body mechanics, postural dysfunction, and pain.   ACTIVITY LIMITATIONS: lifting, bending, squatting, stairs, and locomotion level  PARTICIPATION LIMITATIONS: cleaning, laundry, shopping, community activity, and yard work  PERSONAL FACTORS: Age are also affecting patient's functional outcome.   REHAB POTENTIAL: Excellent  CLINICAL DECISION MAKING: Evolving/moderate complexity  EVALUATION COMPLEXITY: Moderate   GOALS: Goals reviewed with patient? Yes  SHORT TERM GOALS: Target date: 08/09/23  Pt will be ind with initial HEP Baseline: Goal status: Met on 08/01/23  2.  Pt will be educated on movements to avoid to follow post-op protocol to protect tendon transfer site Baseline:  Goal status: MET  3.  Pt will demo improved Rt hip control in stance phase of gait with single walking stick as needed. Baseline:  Goal status: MET 08/07/23    LONG TERM GOALS: Target date: 10/31/2023   Pt will be ind with advanced HEP Baseline:  Goal status: ongoing  2.  Pt will improve LEFS score to at least 55/80 to demo improved function Baseline: 44/80 Goal status: ongoing  3.  Pt will perform 6 min walk test covering at least 700' with walking stick as needed Baseline:  Goal status: MET 08/07/23 with walking stick  4.  Pt will be able to demo squat, lift 10lb,  carry 10lb and walk to mimic household chores and yard activities with proper body mechanics without pain. Baseline:  Goal status: ongoing  5.  Pt will improve glut strength to at least 4+/5 to improve gait mechanics, stairs and transfers. Baseline:  Goal status: Progressing   6.  5x STS improved to 11 sec or less to demo improved functional strength and reduce fall risk. Baseline: 13.23 Goal status: ongoing  PLAN:  PT FREQUENCY: 2x/week  PT DURATION: 8 weeks  PLANNED INTERVENTIONS: 97110-Therapeutic exercises, 97530- Therapeutic activity, [id number]- Neuromuscular re-education, 97535- Self Care, [id number]- Manual therapy, [phone]- Gait training, Patient/Family education, Balance training, Stair training, Cryotherapy, and Moist heat.  PLAN FOR NEXT SESSION:  hip machine; leg press; unilateral cable column rotations, step downs, side stepping with band;  gluteal muscle activation; hip mobilizations; Continue to work on gait endurance, Hip stability exercises, weight bearing  Glade Pesa, PT 10/10/23 12:31 PM Phone: [phone] Fax: [phone]   [facility] [address] [place], [place] [address] Phone # [phone] Fax [phone]

## 2023-10-15 ENCOUNTER — Ambulatory Visit

## 2023-10-15 DIAGNOSIS — M6281 Muscle weakness (generalized): Secondary | ICD-10-CM

## 2023-10-15 DIAGNOSIS — R252 Cramp and spasm: Secondary | ICD-10-CM

## 2023-10-15 DIAGNOSIS — R2689 Other abnormalities of gait and mobility: Secondary | ICD-10-CM

## 2023-10-15 DIAGNOSIS — M25551 Pain in right hip: Secondary | ICD-10-CM

## 2023-10-15 DIAGNOSIS — R262 Difficulty in walking, not elsewhere classified: Secondary | ICD-10-CM

## 2023-10-15 NOTE — Therapy (Signed)
 OUTPATIENT PHYSICAL THERAPY TREATMENT    Patient Name: Rebecca Long MRN: [id number] DOB:[birth year], 73 y.o., female Today's Date: 10/15/2023      END OF SESSION:  PT End of Session - 10/15/23 1233     Visit Number 23    Date for PT Re-Evaluation 10/31/23    Authorization Type HTA no auth required    Progress Note Due on Visit 30    PT Start Time 1147    PT Stop Time 1230    PT Time Calculation (min) 43 min    Activity Tolerance Patient tolerated treatment well    Behavior During Therapy WFL for tasks assessed/performed              Past Medical History:  Diagnosis Date   Anxiety    Cavus foot, acquired 09/09/2012    bilateral cavus feet the one on the left shows that she has some probable neurogenic weakness with abnormal curvature and in inversion contracture Podiatry 06/2015: H&P and x-ray reviewed with patient. Today I went ahead and I did a proximal nerve block I was able to aspirate the second MPJ and got out amount of clear fluid and injected with a quarter cc dexamethasone  Kenalog  and ap   DDD (degenerative disc disease), lumbar    Depression    Endometrial polyp    GERD (gastroesophageal reflux disease)    Hiatal hernia    Hip flexor tendinitis, right 07/02/2016   History of adenomatous polyp of colon    History of esophageal stricture    History of gastric polyp 11/2017   History of PCR DNA positive for HSV2 11/12/2016   History of primary hyperparathyroidism    s/p  left parathryoidectomy 09-17-2008-- resolved   Hyperlipidemia    Hypertension    Insomnia    Lumbar stenosis    Osteopenia    PONV (postoperative nausea and vomiting)    Schatzki's ring    Scoliosis    Tendinopathy of right biceps tendon 07/02/2016   Thickened endometrium    Uterine fibroid    UTI (urinary tract infection) due to Enterococcus 01/05/2020   Wears glasses    Past Surgical History:  Procedure Laterality Date   BREAST REDUCTION SURGERY Bilateral 2000   COLONOSCOPY      CYST EXCISION  1985   head and rt axilla   DILATATION & CURETTAGE/HYSTEROSCOPY WITH MYOSURE N/A 08/13/2018   Procedure: DILATATION & CURETTAGE/HYSTEROSCOPY WITH MYOSURE;  Surgeon: Lavoie, Marie-Lyne, MD;  Location: [facility];  Service: Gynecology;  Laterality: N/A;  requests 10:15am in [place] Gyn block request 30 minutes   DILATION AND CURETTAGE OF UTERUS N/A 08/13/2018   ECTOPIC PREGNANCY SURGERY  1981   OPEN SURGICAL REPAIR OF GLUTEAL TENDON Right 07/02/2023   Procedure: REPAIR, TENDON, GLUTEUS MEDIUS, OPEN;  Surgeon: Genelle Standing, MD;  Location: [facility];  Service: Orthopedics;  Laterality: Right;  RIGHT GLUTEUS MAXIMUS TENDON TRANSFER WITH COLLAGEN PATCH AUGMENTATION   PARATHYROIDECTOMY Left 09/17/2008   dr jeoffrey dawn  @[facility]    left superior  (adenoma)   REDUCTION MAMMAPLASTY Bilateral 2000   years ago   SHOULDER SURGERY Right 2007   frozen   UPPER GASTROINTESTINAL ENDOSCOPY  last one 12-18-2017   dr wilhelmenia   Patient Active Problem List   Diagnosis Date Noted   Tendinopathy of gluteus medius 07/02/2023   Greater trochanteric bursitis of right hip 04/17/2022   C. difficile diarrhea 03/08/2022   Arthritis of right sacroiliac joint (HCC) 02/26/2022   Encounter  for monitoring long-term proton pump inhibitor therapy 07/05/2021   Hx of colonic polyps 06/06/2021   Incontinence of feces with fecal urgency 06/06/2021   Sleep disorder 11/29/2020   Benign paroxysmal positional vertigo of right ear 08/30/2020   AC (acromioclavicular) arthritis 10/22/2019   Adhesive bursitis of left shoulder 07/22/2019   GERD with esophagitis 12/30/2017   Hyperlipidemia 05/15/2017   Morbid obesity (HCC) 11/12/2016   Essential hypertension, benign 10/06/2014   Depression with anxiety 10/06/2014   Stress incontinence 10/06/2014   Osteoporosis 10/06/2014   Degenerative disc disease, lumbar 09/09/2012   Spinal stenosis of lumbar region 09/09/2012    PCP: Charlies Bellini, DO  REFERRING PROVIDER: Elspeth Parker, MD  REFERRING DIAG: post-op REPAIR, TENDON, GLUTEUS MEDIUS, OPEN (Right) - RIGHT GLUTEUS MAXIMUS TENDON TRANSFER WITH COLLAGEN PATCH AUGMENTATION  Rationale for Evaluation and Treatment: Rehabilitation  THERAPY DIAG:  Muscle weakness (generalized)  Difficulty in walking, not elsewhere classified  Pain in right hip  Cramp and spasm  Other abnormalities of gait and mobility  ONSET DATE: 07/02/23 surgery date - chronic pain and weakness prior to surgery  SUBJECTIVE:                                                                                                                                                                                           SUBJECTIVE STATEMENT: I forgot my cane and I was limping just the same as it was before surgery.     PAIN:   Are you having pain? Yes NPRS scale: 0 no pain  Pain location: Rt lateral hip Pain orientation: Right  PAIN TYPE: aching Pain description: intermittent, dull, and aching  Aggravating factors: laying on Rt side, in/out of car, walking Relieving factors: sitting   PRECAUTIONS: Other: protocol from Dr. Parker scanned into media section of chart - avoid until 6 weeks post-op which is 08/13/23: passive ADDuction and ER, active ABDuction and IR  RED FLAGS: None   WEIGHT BEARING RESTRICTIONS: PWB initially, doctor just cleared to get rid of walking stick when ready  FALLS:  Has patient fallen in last 6 months? Yes. Number of falls 1, while carrying a planter and going up stairs  LIVING ENVIRONMENT: Lives with: lives with their spouse Lives in: House/apartment Stairs: Yes: External: 3 steps; on left going up Has following equipment at home: Single point cane  OCCUPATION: retired  PLOF: Independent  PATIENT GOALS: walk without pain and walking stick, can I get back to pickleball?  NEXT MD VISIT: 3 weeks  OBJECTIVE:  Note: Objective measures were completed at Evaluation  unless otherwise noted.  DIAGNOSTIC FINDINGS:  Xray (3 views right  hip): No femoral acetabular osteoarthritis   MRI (right hip): There is an end-stage full-thickness tear of the gluteus medius with significant atrophy on T1 coronal view  PATIENT SURVEYS:  LEFS: 44/80 7/16: LEFS  42/80 9/4: 32/80  COGNITION: Overall cognitive status: Within functional limits for tasks assessed     SENSATION: WFL  MUSCLE LENGTH:   POSTURE: flexed trunk   PALPATION: Mild tenderness around incision - steri strips placed today after stitches were removed  LUMBAR ROM:  WFL  LOWER EXTREMITY ROM:    Pt able to perform seated march A/ROM and quadruped rocking for hip flexion beyond 90 deg without pain  LOWER EXTREMITY MMT:   Lt LE 4+/5 Rt hip at least 3+/5 - isometric testing only, resisted isometrics strong and painfree   FUNCTIONAL TESTS:  Eval: 5 times sit to stand: 13.23 Timed up and go (TUG): 12.24 using walking stick 2 MWT: 315 feet using single walking stick in Lt UE  08/01/2023: 6 minute walk test:  759 ft with [facility] 08/07/23: 6 minute walk test: 781' with [facility] 7/15: 5x STS with hands: 10.40 5x STS no hands 11.19 TUG: with cane: 9.29 sec  09/05/23: 6 MWT 1010 feet with SPC 5x STS no hands 9.94 GAIT: Distance walked: 315' in 2 min  Assistive device utilized: single walking stick Level of assistance: Modified independence Comments: Rt early stance phase Trendelenburg  TREATMENT DATE: 10/15/23 Bike: level 3x 5 minutes- PT present to discuss progress  Step up on foam pad: forward and lateral 2x10 bil each Star lunges with slider under Lt foot: x10  Lunge onto Rt on Bosu with min UE support  Front plank elbows on tall table hip circles Supine hip thrusts with head of bed elevated 8# weight in crease of hips 2x 10  Side bridge/plank 5x (challenging) on other side with clam 5x Prone modified plank elbows and knees 5x 5 sec holds Leg press seat 6 85# bil x20; 45# single leg 2x10  right/left 6 inch step ups 1 hand support needed 2x10 bil   10/08/23 Bike: level 3x 5 minutes- PT present to discuss progress  Standing Side plank elbow on tall table 10x right/left Front planks on elbows on tall table 5 sec 10x Front plank elbows on tall table hip circles Supine hip thrusts with head of bed elevated 8# weight in crease of hips 2x 10  Side bridge/plank 5x (challenging) on other side with clam 5x Prone modified plank elbows and knees 5x 5 sec holds Leg press seat 6 80# bil x20; 45# single leg 2x10 right/left 6 inch step ups 2 hand support needed 10x Education on glute medius location using diagrams   10/03/23 Nu-Step L3 5 min discussed pt status LEFS Pt defers 6 MWT today WB on right with 2 step taps 10x  4 inch right step ups 10x  4 inch right lateral step ups 10x  2 inch step downs 10x bil UE assisted Seated green band HS curls 15x right/left 5# cable pulley trunk rotation and weight shift 12 right/left (like a golf swing)  Agility/dynamic balance: step multi directions to circles with UE reach as if using pickleball paddle  Hip matrix extension 40#: 10x right/left Leg press seat 6 80# bil 15x; 40# single leg 15x right/left Seated blue ball roll outs to stretch lumbar spine   PATIENT EDUCATION:  Education details: [id number] Person educated: Patient Education method: Explanation, Demonstration, Verbal cues, and Handouts Education comprehension: verbalized understanding and returned demonstration  HOME EXERCISE PROGRAM:  Access Code: [email or web address] URL: [email or web address] Date: 07/12/2023 Prepared by: Orvil Beuhring  Exercises - Supine Posterior Pelvic Tilt  - 2 x daily - 7 x weekly - 2 sets - 10 reps - Supine Short Arc Quad  - 2 x daily - 7 x weekly - 2 sets - 10 reps - Supine Hip Adduction Isometric with Ball  - 2 x daily - 7 x weekly - 2 sets - 10 reps - 5 hold - Quadruped Rocking Backward  - 2 x daily - 7 x weekly - 1-2 sets - 10 reps - 5  hold - Seated Isometric Hip Abduction  - 2 x daily - 7 x weekly - 2 sets - 10 reps - 5 hold - Standing Knee Flexion AROM with Chair Support  - 2 x daily - 7 x weekly - 2 sets - 10 reps  ASSESSMENT:  CLINICAL IMPRESSION: Pt continues to work on Rt gluteal strength and stability.  She continues to demonstrate antalgic gait and this is common after the surgery she had. She required less UE support with 6 step ups today.  She did well with sit to stand with 10# kettlebell and demonstrated good control. PT monitored for safety, fatigue and alignment.  Patient will benefit from skilled PT to address the below impairments and improve overall function.    OBJECTIVE IMPAIRMENTS: Abnormal gait, decreased balance, decreased coordination, difficulty walking, decreased strength, increased edema, impaired flexibility, improper body mechanics, postural dysfunction, and pain.   ACTIVITY LIMITATIONS: lifting, bending, squatting, stairs, and locomotion level  PARTICIPATION LIMITATIONS: cleaning, laundry, shopping, community activity, and yard work  PERSONAL FACTORS: Age are also affecting patient's functional outcome.   REHAB POTENTIAL: Excellent  CLINICAL DECISION MAKING: Evolving/moderate complexity  EVALUATION COMPLEXITY: Moderate   GOALS: Goals reviewed with patient? Yes  SHORT TERM GOALS: Target date: 08/09/23  Pt will be ind with initial HEP Baseline: Goal status: Met on 08/01/23  2.  Pt will be educated on movements to avoid to follow post-op protocol to protect tendon transfer site Baseline:  Goal status: MET  3.  Pt will demo improved Rt hip control in stance phase of gait with single walking stick as needed. Baseline:  Goal status: MET 08/07/23    LONG TERM GOALS: Target date: 10/31/2023   Pt will be ind with advanced HEP Baseline:  Goal status: ongoing  2.  Pt will improve LEFS score to at least 55/80 to demo improved function Baseline: 44/80 Goal status: ongoing  3.  Pt  will perform 6 min walk test covering at least 700' with walking stick as needed Baseline:  Goal status: MET 08/07/23 with walking stick  4.  Pt will be able to demo squat, lift 10lb, carry 10lb and walk to mimic household chores and yard activities with proper body mechanics without pain. Baseline:  Goal status: ongoing  5.  Pt will improve glut strength to at least 4+/5 to improve gait mechanics, stairs and transfers. Baseline:  Goal status: Progressing   6.  5x STS improved to 11 sec or less to demo improved functional strength and reduce fall risk. Baseline: 13.23 Goal status: ongoing  PLAN:  PT FREQUENCY: 2x/week  PT DURATION: 8 weeks  PLANNED INTERVENTIONS: 97110-Therapeutic exercises, 97530- Therapeutic activity, [id number]- Neuromuscular re-education, 97535- Self Care, [id number]- Manual therapy, [phone]- Gait training, Patient/Family education, Balance training, Stair training, Cryotherapy, and Moist heat.  PLAN FOR NEXT SESSION:  hip machine; leg press; unilateral cable column rotations, step downs, side stepping with band;  gluteal  muscle activation; hip mobilizations; Continue to work on gait endurance, Hip stability exercises, weight bearing  Burnard Joy, PT 10/15/23 12:35 PM    [facility] [address] [place], [place] [address] Phone # [phone] Fax [phone]

## 2023-10-17 ENCOUNTER — Ambulatory Visit

## 2023-10-17 DIAGNOSIS — M6281 Muscle weakness (generalized): Secondary | ICD-10-CM | POA: Diagnosis not present

## 2023-10-17 DIAGNOSIS — R262 Difficulty in walking, not elsewhere classified: Secondary | ICD-10-CM

## 2023-10-17 DIAGNOSIS — M25551 Pain in right hip: Secondary | ICD-10-CM

## 2023-10-17 DIAGNOSIS — R252 Cramp and spasm: Secondary | ICD-10-CM

## 2023-10-17 NOTE — Therapy (Signed)
 OUTPATIENT PHYSICAL THERAPY TREATMENT    Patient Name: Rebecca Long MRN: [id number] DOB:1950-04-09, 73 y.o., female Today's Date: 10/17/2023      END OF SESSION:  PT End of Session - 10/17/23 1227     Visit Number 24    Date for Recertification  10/31/23    Authorization Type HTA no auth required    Progress Note Due on Visit 30    PT Start Time 1147    PT Stop Time 1227    PT Time Calculation (min) 40 min    Activity Tolerance Patient tolerated treatment well    Behavior During Therapy [facility] for tasks assessed/performed               Past Medical History:  Diagnosis Date   Anxiety    Cavus foot, acquired 09/09/2012    bilateral cavus feet the one on the left shows that she has some probable neurogenic weakness with abnormal curvature and in inversion contracture Podiatry 06/2015: H&P and x-ray reviewed with patient. Today I went ahead and I did a proximal nerve block I was able to aspirate the second MPJ and got out amount of clear fluid and injected with a quarter cc dexamethasone  Kenalog  and ap   DDD (degenerative disc disease), lumbar    Depression    Endometrial polyp    GERD (gastroesophageal reflux disease)    Hiatal hernia    Hip flexor tendinitis, right 07/02/2016   History of adenomatous polyp of colon    History of esophageal stricture    History of gastric polyp 11/2017   History of PCR DNA positive for HSV2 11/12/2016   History of primary hyperparathyroidism    s/p  left parathryoidectomy 09-17-2008-- resolved   Hyperlipidemia    Hypertension    Insomnia    Lumbar stenosis    Osteopenia    PONV (postoperative nausea and vomiting)    Schatzki's ring    Scoliosis    Tendinopathy of right biceps tendon 07/02/2016   Thickened endometrium    Uterine fibroid    UTI (urinary tract infection) due to Enterococcus 01/05/2020   Wears glasses    Past Surgical History:  Procedure Laterality Date   BREAST REDUCTION SURGERY Bilateral 2000   COLONOSCOPY      CYST EXCISION  1985   head and rt axilla   DILATATION & CURETTAGE/HYSTEROSCOPY WITH MYOSURE N/A 08/13/2018   Procedure: DILATATION & CURETTAGE/HYSTEROSCOPY WITH MYOSURE;  Surgeon: Lavoie, Marie-Lyne, MD;  Location: [facility];  Service: Gynecology;  Laterality: N/A;  requests 10:15am in [place] Gyn block request 30 minutes   DILATION AND CURETTAGE OF UTERUS N/A 08/13/2018   ECTOPIC PREGNANCY SURGERY  1981   OPEN SURGICAL REPAIR OF GLUTEAL TENDON Right 07/02/2023   Procedure: REPAIR, TENDON, GLUTEUS MEDIUS, OPEN;  Surgeon: Genelle Standing, MD;  Location: [facility];  Service: Orthopedics;  Laterality: Right;  RIGHT GLUTEUS MAXIMUS TENDON TRANSFER WITH COLLAGEN PATCH AUGMENTATION   PARATHYROIDECTOMY Left 09/17/2008   dr jeoffrey dawn  @[facility]    left superior  (adenoma)   REDUCTION MAMMAPLASTY Bilateral 2000   years ago   SHOULDER SURGERY Right 2007   frozen   UPPER GASTROINTESTINAL ENDOSCOPY  last one 12-18-2017   dr wilhelmenia   Patient Active Problem List   Diagnosis Date Noted   Tendinopathy of gluteus medius 07/02/2023   Greater trochanteric bursitis of right hip 04/17/2022   C. difficile diarrhea 03/08/2022   Arthritis of right sacroiliac joint (HCC) 02/26/2022  Encounter for monitoring long-term proton pump inhibitor therapy 07/05/2021   Hx of colonic polyps 06/06/2021   Incontinence of feces with fecal urgency 06/06/2021   Sleep disorder 11/29/2020   Benign paroxysmal positional vertigo of right ear 08/30/2020   AC (acromioclavicular) arthritis 10/22/2019   Adhesive bursitis of left shoulder 07/22/2019   GERD with esophagitis 12/30/2017   Hyperlipidemia 05/15/2017   Morbid obesity (HCC) 11/12/2016   Essential hypertension, benign 10/06/2014   Depression with anxiety 10/06/2014   Stress incontinence 10/06/2014   Osteoporosis 10/06/2014   Degenerative disc disease, lumbar 09/09/2012   Spinal stenosis of lumbar region 09/09/2012    PCP: Charlies Bellini, DO  REFERRING PROVIDER: Elspeth Parker, MD  REFERRING DIAG: post-op REPAIR, TENDON, GLUTEUS MEDIUS, OPEN (Right) - RIGHT GLUTEUS MAXIMUS TENDON TRANSFER WITH COLLAGEN PATCH AUGMENTATION  Rationale for Evaluation and Treatment: Rehabilitation  THERAPY DIAG:  Muscle weakness (generalized)  Difficulty in walking, not elsewhere classified  Pain in right hip  Cramp and spasm  ONSET DATE: 07/02/23 surgery date - chronic pain and weakness prior to surgery  SUBJECTIVE:                                                                                                                                                                                           SUBJECTIVE STATEMENT: I tried to walk yesterday and it was very painful.     PAIN: 10/17/23  Are you having pain? Yes NPRS scale: 3/10 Pain location: Rt lateral hip Pain orientation: Right  PAIN TYPE: aching Pain description: intermittent, dull, and aching  Aggravating factors: laying on Rt side, in/out of car, walking Relieving factors: sitting   PRECAUTIONS: Other: protocol from Dr. Parker scanned into media section of chart - avoid until 6 weeks post-op which is 08/13/23: passive ADDuction and ER, active ABDuction and IR  RED FLAGS: None   WEIGHT BEARING RESTRICTIONS: PWB initially, doctor just cleared to get rid of walking stick when ready  FALLS:  Has patient fallen in last 6 months? Yes. Number of falls 1, while carrying a planter and going up stairs  LIVING ENVIRONMENT: Lives with: lives with their spouse Lives in: House/apartment Stairs: Yes: External: 3 steps; on left going up Has following equipment at home: Single point cane  OCCUPATION: retired  PLOF: Independent  PATIENT GOALS: walk without pain and walking stick, can I get back to pickleball?  NEXT MD VISIT: 3 weeks  OBJECTIVE:  Note: Objective measures were completed at Evaluation unless otherwise noted.  DIAGNOSTIC FINDINGS:  Xray (3 views  right hip): No femoral acetabular osteoarthritis   MRI (right hip): There is an end-stage full-thickness  tear of the gluteus medius with significant atrophy on T1 coronal view  PATIENT SURVEYS:  LEFS: 44/80 7/16: LEFS  42/80 9/4: 32/80  COGNITION: Overall cognitive status: Within functional limits for tasks assessed     SENSATION: WFL  MUSCLE LENGTH:   POSTURE: flexed trunk   PALPATION: Mild tenderness around incision - steri strips placed today after stitches were removed  LUMBAR ROM:  WFL  LOWER EXTREMITY ROM:    Pt able to perform seated march A/ROM and quadruped rocking for hip flexion beyond 90 deg without pain  LOWER EXTREMITY MMT:   Lt LE 4+/5 Rt hip at least 3+/5 - isometric testing only, resisted isometrics strong and painfree   FUNCTIONAL TESTS:  Eval: 5 times sit to stand: 13.23 Timed up and go (TUG): 12.24 using walking stick 2 MWT: 315 feet using single walking stick in Lt UE  08/01/2023: 6 minute walk test:  759 ft with [facility] 08/07/23: 6 minute walk test: 781' with [facility] 7/15: 5x STS with hands: 10.40 5x STS no hands 11.19 TUG: with cane: 9.29 sec  09/05/23: 6 MWT 1010 feet with SPC 5x STS no hands 9.94 GAIT: Distance walked: 315' in 2 min  Assistive device utilized: single walking stick Level of assistance: Modified independence Comments: Rt early stance phase Trendelenburg  TREATMENT DATE:  10/17/23 Nustep level 5x 8 min- PT present to discuss progress  Step up on foam pad: forward and lateral 2x10 bil each Star lunges with slider under Lt foot: x10  Lunge onto Rt on Bosu with min UE support- forward and lateral  Sidestepping with red loop around thighs along barre x4 laps Sit to stand with 10# kettle bell parallel stance and staggered stance bil x10 Leg press seat 6 85# bil x20; 45# single leg 2x10 right/left 6 inch step ups 1 hand support needed 2x10 bil Figure 4 3x20 seconds   10/15/23 Bike: level 3x 5 minutes- PT present to discuss  progress  Step up on foam pad: forward and lateral 2x10 bil each Star lunges with slider under Lt foot: x10  Lunge onto Rt on Bosu with min UE support  Sit to stand with 10# kettle bell parallel stance and staggered stance bil x10   Side bridge/plank 5x (challenging) on other side with clam 5x Prone modified plank elbows and knees 5x 5 sec holds Leg press seat 6 85# bil x20; 45# single leg 2x10 right/left 6 inch step ups 1 hand support needed 2x10 bil   10/08/23 Bike: level 3x 5 minutes- PT present to discuss progress  Standing Side plank elbow on tall table 10x right/left Front planks on elbows on tall table 5 sec 10x Front plank elbows on tall table hip circles Supine hip thrusts with head of bed elevated 8# weight in crease of hips 2x 10  Side bridge/plank 5x (challenging) on other side with clam 5x Prone modified plank elbows and knees 5x 5 sec holds Leg press seat 6 80# bil x20; 45# single leg 2x10 right/left 6 inch step ups 2 hand support needed 10x Education on glute medius location using diagrams    PATIENT EDUCATION:  Education details: [email or web address] Person educated: Patient Education method: Explanation, Demonstration, Verbal cues, and Handouts Education comprehension: verbalized understanding and returned demonstration  HOME EXERCISE PROGRAM: Access Code: [email or web address] URL: [email or web address] Date: 07/12/2023 Prepared by: Orvil Beuhring  Exercises - Supine Posterior Pelvic Tilt  - 2 x daily - 7 x weekly - 2 sets - 10 reps - Supine Short Arc Quad  -  2 x daily - 7 x weekly - 2 sets - 10 reps - Supine Hip Adduction Isometric with Ball  - 2 x daily - 7 x weekly - 2 sets - 10 reps - 5 hold - Quadruped Rocking Backward  - 2 x daily - 7 x weekly - 1-2 sets - 10 reps - 5 hold - Seated Isometric Hip Abduction  - 2 x daily - 7 x weekly - 2 sets - 10 reps - 5 hold - Standing Knee Flexion AROM with Chair Support  - 2 x daily - 7 x weekly - 2 sets - 10  reps  ASSESSMENT:  CLINICAL IMPRESSION: Pt continues to work on Rt gluteal strength and stability.  She continues to demonstrate antalgic gait and this is common after the surgery she had. She required less UE support stabilization on Rt LE on balance pad.  We are able to advance exercises due to improved functional strength and stability. PT monitored for safety, fatigue and alignment, provided cueing for gluteal activation.  Patient will benefit from skilled PT to address the below impairments and improve overall function.    OBJECTIVE IMPAIRMENTS: Abnormal gait, decreased balance, decreased coordination, difficulty walking, decreased strength, increased edema, impaired flexibility, improper body mechanics, postural dysfunction, and pain.   ACTIVITY LIMITATIONS: lifting, bending, squatting, stairs, and locomotion level  PARTICIPATION LIMITATIONS: cleaning, laundry, shopping, community activity, and yard work  PERSONAL FACTORS: Age are also affecting patient's functional outcome.   REHAB POTENTIAL: Excellent  CLINICAL DECISION MAKING: Evolving/moderate complexity  EVALUATION COMPLEXITY: Moderate   GOALS: Goals reviewed with patient? Yes  SHORT TERM GOALS: Target date: 08/09/23  Pt will be ind with initial HEP Baseline: Goal status: Met on 08/01/23  2.  Pt will be educated on movements to avoid to follow post-op protocol to protect tendon transfer site Baseline:  Goal status: MET  3.  Pt will demo improved Rt hip control in stance phase of gait with single walking stick as needed. Baseline:  Goal status: MET 08/07/23    LONG TERM GOALS: Target date: 10/31/2023   Pt will be ind with advanced HEP Baseline:  Goal status: ongoing  2.  Pt will improve LEFS score to at least 55/80 to demo improved function Baseline: 44/80 Goal status: ongoing  3.  Pt will perform 6 min walk test covering at least 700' with walking stick as needed Baseline:  Goal status: MET 08/07/23 with  walking stick  4.  Pt will be able to demo squat, lift 10lb, carry 10lb and walk to mimic household chores and yard activities with proper body mechanics without pain. Baseline:  Goal status: ongoing  5.  Pt will improve glut strength to at least 4+/5 to improve gait mechanics, stairs and transfers. Baseline:  Goal status: Progressing   6.  5x STS improved to 11 sec or less to demo improved functional strength and reduce fall risk. Baseline: 13.23 Goal status: ongoing  PLAN:  PT FREQUENCY: 2x/week  PT DURATION: 8 weeks  PLANNED INTERVENTIONS: 97110-Therapeutic exercises, 97530- Therapeutic activity, [id number]- Neuromuscular re-education, 97535- Self Care, [id number]- Manual therapy, [phone]- Gait training, Patient/Family education, Balance training, Stair training, Cryotherapy, and Moist heat.  PLAN FOR NEXT SESSION: discuss D/C vs renewal next week.   hip machine; leg press; unilateral cable column rotations, step downs, side stepping with band;  gluteal muscle activation; hip mobilizations; Continue to work on gait endurance, Hip stability exercises, weight bearing  Burnard Joy, PT 10/17/23 12:29 PM    [facility] [address], [address] [place], [place] [address] Phone # [phone] Fax [phone]

## 2023-10-22 ENCOUNTER — Ambulatory Visit: Admitting: Physical Therapy

## 2023-10-22 DIAGNOSIS — M25551 Pain in right hip: Secondary | ICD-10-CM

## 2023-10-22 DIAGNOSIS — R262 Difficulty in walking, not elsewhere classified: Secondary | ICD-10-CM

## 2023-10-22 DIAGNOSIS — M6281 Muscle weakness (generalized): Secondary | ICD-10-CM

## 2023-10-22 NOTE — Therapy (Signed)
 OUTPATIENT PHYSICAL THERAPY TREATMENT    Patient Name: Rebecca Long MRN: [id number] DOB:05-23-1950, 73 y.o., female Today's Date: 10/22/2023      END OF SESSION:  PT End of Session - 10/22/23 1150     Visit Number 25    Date for Recertification  10/31/23    Authorization Type HTA no auth required    Progress Note Due on Visit 30    PT Start Time 1147    PT Stop Time 1229    PT Time Calculation (min) 42 min    Activity Tolerance Patient tolerated treatment well               Past Medical History:  Diagnosis Date   Anxiety    Cavus foot, acquired 09/09/2012    bilateral cavus feet the one on the left shows that she has some probable neurogenic weakness with abnormal curvature and in inversion contracture Podiatry 06/2015: H&P and x-ray reviewed with patient. Today I went ahead and I did a proximal nerve block I was able to aspirate the second MPJ and got out amount of clear fluid and injected with a quarter cc dexamethasone  Kenalog  and ap   DDD (degenerative disc disease), lumbar    Depression    Endometrial polyp    GERD (gastroesophageal reflux disease)    Hiatal hernia    Hip flexor tendinitis, right 07/02/2016   History of adenomatous polyp of colon    History of esophageal stricture    History of gastric polyp 11/2017   History of PCR DNA positive for HSV2 11/12/2016   History of primary hyperparathyroidism    s/p  left parathryoidectomy 09-17-2008-- resolved   Hyperlipidemia    Hypertension    Insomnia    Lumbar stenosis    Osteopenia    PONV (postoperative nausea and vomiting)    Schatzki's ring    Scoliosis    Tendinopathy of right biceps tendon 07/02/2016   Thickened endometrium    Uterine fibroid    UTI (urinary tract infection) due to Enterococcus 01/05/2020   Wears glasses    Past Surgical History:  Procedure Laterality Date   BREAST REDUCTION SURGERY Bilateral 2000   COLONOSCOPY     CYST EXCISION  1985   head and rt axilla   DILATATION &  CURETTAGE/HYSTEROSCOPY WITH MYOSURE N/A 08/13/2018   Procedure: DILATATION & CURETTAGE/HYSTEROSCOPY WITH MYOSURE;  Surgeon: Lavoie, Marie-Lyne, MD;  Location: [facility];  Service: Gynecology;  Laterality: N/A;  requests 10:15am in [place] Gyn block request 30 minutes   DILATION AND CURETTAGE OF UTERUS N/A 08/13/2018   ECTOPIC PREGNANCY SURGERY  1981   OPEN SURGICAL REPAIR OF GLUTEAL TENDON Right 07/02/2023   Procedure: REPAIR, TENDON, GLUTEUS MEDIUS, OPEN;  Surgeon: Genelle Standing, MD;  Location: [facility];  Service: Orthopedics;  Laterality: Right;  RIGHT GLUTEUS MAXIMUS TENDON TRANSFER WITH COLLAGEN PATCH AUGMENTATION   PARATHYROIDECTOMY Left 09/17/2008   dr jeoffrey dawn  @[facility]    left superior  (adenoma)   REDUCTION MAMMAPLASTY Bilateral 2000   years ago   SHOULDER SURGERY Right 2007   frozen   UPPER GASTROINTESTINAL ENDOSCOPY  last one 12-18-2017   dr wilhelmenia   Patient Active Problem List   Diagnosis Date Noted   Tendinopathy of gluteus medius 07/02/2023   Greater trochanteric bursitis of right hip 04/17/2022   C. difficile diarrhea 03/08/2022   Arthritis of right sacroiliac joint 02/26/2022   Encounter for monitoring long-term proton pump inhibitor therapy 07/05/2021  Hx of colonic polyps 06/06/2021   Incontinence of feces with fecal urgency 06/06/2021   Sleep disorder 11/29/2020   Benign paroxysmal positional vertigo of right ear 08/30/2020   AC (acromioclavicular) arthritis 10/22/2019   Adhesive bursitis of left shoulder 07/22/2019   GERD with esophagitis 12/30/2017   Hyperlipidemia 05/15/2017   Morbid obesity (HCC) 11/12/2016   Essential hypertension, benign 10/06/2014   Depression with anxiety 10/06/2014   Stress incontinence 10/06/2014   Osteoporosis 10/06/2014   Degenerative disc disease, lumbar 09/09/2012   Spinal stenosis of lumbar region 09/09/2012    PCP: Charlies Bellini, DO  REFERRING PROVIDER: Elspeth Parker, MD  REFERRING  DIAG: post-op REPAIR, TENDON, GLUTEUS MEDIUS, OPEN (Right) - RIGHT GLUTEUS MAXIMUS TENDON TRANSFER WITH COLLAGEN PATCH AUGMENTATION  Rationale for Evaluation and Treatment: Rehabilitation  THERAPY DIAG:  Muscle weakness (generalized)  Difficulty in walking, not elsewhere classified  Pain in right hip  ONSET DATE: 07/02/23 surgery date - chronic pain and weakness prior to surgery  SUBJECTIVE:                                                                                                                                                                                           SUBJECTIVE STATEMENT: Hip is still there.  There's a little pain there but it's better than before surgery.  I decided not to use my cane at home.    PAIN: 10/22/23  Are you having pain? Yes NPRS scale: 1-2/10 Pain location: Rt lateral hip Pain orientation: Right  PAIN TYPE: aching Pain description: intermittent, dull, and aching  Aggravating factors: laying on Rt side, in/out of car, walking Relieving factors: sitting   PRECAUTIONS: Other: protocol from Dr. Parker scanned into media section of chart - avoid until 6 weeks post-op which is 08/13/23: passive ADDuction and ER, active ABDuction and IR  RED FLAGS: None   WEIGHT BEARING RESTRICTIONS: PWB initially, doctor just cleared to get rid of walking stick when ready  FALLS:  Has patient fallen in last 6 months? Yes. Number of falls 1, while carrying a planter and going up stairs  LIVING ENVIRONMENT: Lives with: lives with their spouse Lives in: House/apartment Stairs: Yes: External: 3 steps; on left going up Has following equipment at home: Single point cane  OCCUPATION: retired  PLOF: Independent  PATIENT GOALS: walk without pain and walking stick, can I get back to pickleball?  NEXT MD VISIT: 3 weeks  OBJECTIVE:  Note: Objective measures were completed at Evaluation unless otherwise noted.  DIAGNOSTIC FINDINGS:  Xray (3 views right  hip): No femoral acetabular osteoarthritis   MRI (right hip): There is an end-stage full-thickness  tear of the gluteus medius with significant atrophy on T1 coronal view  PATIENT SURVEYS:  LEFS: 44/80 7/16: LEFS  42/80 9/4: 32/80  COGNITION: Overall cognitive status: Within functional limits for tasks assessed     SENSATION: WFL  MUSCLE LENGTH:   POSTURE: flexed trunk   PALPATION: Mild tenderness around incision - steri strips placed today after stitches were removed  LUMBAR ROM:  WFL  LOWER EXTREMITY ROM:    Pt able to perform seated march A/ROM and quadruped rocking for hip flexion beyond 90 deg without pain  LOWER EXTREMITY MMT:   Lt LE 4+/5 Rt hip at least 3+/5 - isometric testing only, resisted isometrics strong and painfree   FUNCTIONAL TESTS:  Eval: 5 times sit to stand: 13.23 Timed up and go (TUG): 12.24 using walking stick 2 MWT: 315 feet using single walking stick in Lt UE  08/01/2023: 6 minute walk test:  759 ft with [facility] 08/07/23: 6 minute walk test: 781' with [facility] 7/15: 5x STS with hands: 10.40 5x STS no hands 11.19 TUG: with cane: 9.29 sec  09/05/23: 6 MWT 1010 feet with SPC 5x STS no hands 9.94 GAIT: Distance walked: 315' in 2 min  Assistive device utilized: single walking stick Level of assistance: Modified independence Comments: Rt early stance phase Trendelenburg  TREATMENT DATE: 10/22/23 Bike: level 2x 6 minutes- PT present to discuss progress  Side step cable 5 # with belt 7x right/left At the stairs 2nd step hip flexor, gastroc and quadratus lumborum muscle lengthening 3 sets of 5 right/left: Rocking forward and back with arm reach up and over 2 rounds 1 min rest break b/w rounds:   10 lateral sliders each side right/left 6 clockwise and counterclock slider circles right/left 10 single leg march with 5# weight in same side hand right/left   Side wall plank:with left hip flexion 10x needs UE support  Bridge with feet on 8 inch step  10x Supine with heel taps for core activation 2x10 Side bridge 8x right/left   10/17/23 Nustep level 5x 8 min- PT present to discuss progress  Step up on foam pad: forward and lateral 2x10 bil each Star lunges with slider under Lt foot: x10  Lunge onto Rt on Bosu with min UE support- forward and lateral  Sidestepping with red loop around thighs along barre x4 laps Sit to stand with 10# kettle bell parallel stance and staggered stance bil x10 Leg press seat 6 85# bil x20; 45# single leg 2x10 right/left 6 inch step ups 1 hand support needed 2x10 bil Figure 4 3x20 seconds   10/15/23 Bike: level 3x 5 minutes- PT present to discuss progress  Step up on foam pad: forward and lateral 2x10 bil each Star lunges with slider under Lt foot: x10  Lunge onto Rt on Bosu with min UE support  Sit to stand with 10# kettle bell parallel stance and staggered stance bil x10   Side bridge/plank 5x (challenging) on other side with clam 5x Prone modified plank elbows and knees 5x 5 sec holds Leg press seat 6 85# bil x20; 45# single leg 2x10 right/left 6 inch step ups 1 hand support needed 2x10 bil   10/08/23 Bike: level 3x 5 minutes- PT present to discuss progress  Standing Side plank elbow on tall table 10x right/left Front planks on elbows on tall table 5 sec 10x Front plank elbows on tall table hip circles Supine hip thrusts with head of bed elevated 8# weight in crease of hips 2x 10  Side bridge/plank 5x (challenging) on other side with clam 5x Prone modified plank elbows and knees 5x 5 sec holds Leg press seat 6 80# bil x20; 45# single leg 2x10 right/left 6 inch step ups 2 hand support needed 10x Education on glute medius location using diagrams    PATIENT EDUCATION:  Education details: [email or web address] Person educated: Patient Education method: Explanation, Demonstration, Verbal cues, and Handouts Education comprehension: verbalized understanding and returned demonstration  HOME EXERCISE  PROGRAM: Access Code: [email or web address] URL: [email or web address] Date: 07/12/2023 Prepared by: Orvil Beuhring  Exercises - Supine Posterior Pelvic Tilt  - 2 x daily - 7 x weekly - 2 sets - 10 reps - Supine Short Arc Quad  - 2 x daily - 7 x weekly - 2 sets - 10 reps - Supine Hip Adduction Isometric with Ball  - 2 x daily - 7 x weekly - 2 sets - 10 reps - 5 hold - Quadruped Rocking Backward  - 2 x daily - 7 x weekly - 1-2 sets - 10 reps - 5 hold - Seated Isometric Hip Abduction  - 2 x daily - 7 x weekly - 2 sets - 10 reps - 5 hold - Standing Knee Flexion AROM with Chair Support  - 2 x daily - 7 x weekly - 2 sets - 10 reps  ASSESSMENT:  CLINICAL IMPRESSION: Treatment focus on activation of gluteus medius needed for single leg stance and gait especially for longer periods of time.  Verbal cues to improve technique during ex's and limit compensatory strategies.  Discussed plan of care including assessment of goals next week.  Will continue to update and finalize HEP.   OBJECTIVE IMPAIRMENTS: Abnormal gait, decreased balance, decreased coordination, difficulty walking, decreased strength, increased edema, impaired flexibility, improper body mechanics, postural dysfunction, and pain.   ACTIVITY LIMITATIONS: lifting, bending, squatting, stairs, and locomotion level  PARTICIPATION LIMITATIONS: cleaning, laundry, shopping, community activity, and yard work  PERSONAL FACTORS: Age are also affecting patient's functional outcome.   REHAB POTENTIAL: Excellent  CLINICAL DECISION MAKING: Evolving/moderate complexity  EVALUATION COMPLEXITY: Moderate   GOALS: Goals reviewed with patient? Yes  SHORT TERM GOALS: Target date: 08/09/23  Pt will be ind with initial HEP Baseline: Goal status: Met on 08/01/23  2.  Pt will be educated on movements to avoid to follow post-op protocol to protect tendon transfer site Baseline:  Goal status: MET  3.  Pt will demo improved Rt hip control in  stance phase of gait with single walking stick as needed. Baseline:  Goal status: MET 08/07/23    LONG TERM GOALS: Target date: 10/31/2023   Pt will be ind with advanced HEP Baseline:  Goal status: ongoing  2.  Pt will improve LEFS score to at least 55/80 to demo improved function Baseline: 44/80 Goal status: ongoing  3.  Pt will perform 6 min walk test covering at least 700' with walking stick as needed Baseline:  Goal status: MET 08/07/23 with walking stick  4.  Pt will be able to demo squat, lift 10lb, carry 10lb and walk to mimic household chores and yard activities with proper body mechanics without pain. Baseline:  Goal status: ongoing  5.  Pt will improve glut strength to at least 4+/5 to improve gait mechanics, stairs and transfers. Baseline:  Goal status: Progressing   6.  5x STS improved to 11 sec or less to demo improved functional strength and reduce fall risk. Baseline: 13.23 Goal status: ongoing  PLAN:  PT FREQUENCY: 2x/week  PT  DURATION: 8 weeks  PLANNED INTERVENTIONS: 97110-Therapeutic exercises, 97530- Therapeutic activity, [id number]- Neuromuscular re-education, 97535- Self Care, [id number]- Manual therapy, [phone]- Gait training, Patient/Family education, Balance training, Stair training, Cryotherapy, and Moist heat.  PLAN FOR NEXT SESSION: check goals next week to determine D/C vs renewal next week. Sees the doctor in mid Oct.; Mom's birthday party Oct 11;  hip machine; leg press; unilateral cable column rotations, step downs, side stepping with band;  gluteal muscle activation; hip mobilizations; Continue to work on gait endurance, Hip stability exercises, weight bearing  Glade Pesa, PT 10/22/23 9:00 PM Phone: [phone] Fax: [phone]  [facility] [address], [address] [place], [place] [address] Phone # [phone] Fax [phone]

## 2023-10-24 ENCOUNTER — Ambulatory Visit: Admitting: Physical Therapy

## 2023-10-24 DIAGNOSIS — R262 Difficulty in walking, not elsewhere classified: Secondary | ICD-10-CM

## 2023-10-24 DIAGNOSIS — M25551 Pain in right hip: Secondary | ICD-10-CM

## 2023-10-24 DIAGNOSIS — M6281 Muscle weakness (generalized): Secondary | ICD-10-CM | POA: Diagnosis not present

## 2023-10-24 NOTE — Therapy (Signed)
 OUTPATIENT PHYSICAL THERAPY TREATMENT    Patient Name: Rebecca Long MRN: [id number] DOB:[birth year], 73 y.o., female Today's Date: 10/24/2023      END OF SESSION:  PT End of Session - 10/24/23 1224     Visit Number 26    Date for Recertification  10/31/23    Authorization Type HTA no auth required    Progress Note Due on Visit 30    PT Start Time 1201    PT Stop Time 1225    PT Time Calculation (min) 24 min    Activity Tolerance Patient tolerated treatment well               Past Medical History:  Diagnosis Date   Anxiety    Cavus foot, acquired 09/09/2012    bilateral cavus feet the one on the left shows that she has some probable neurogenic weakness with abnormal curvature and in inversion contracture Podiatry 06/2015: H&P and x-ray reviewed with patient. Today I went ahead and I did a proximal nerve block I was able to aspirate the second MPJ and got out amount of clear fluid and injected with a quarter cc dexamethasone  Kenalog  and ap   DDD (degenerative disc disease), lumbar    Depression    Endometrial polyp    GERD (gastroesophageal reflux disease)    Hiatal hernia    Hip flexor tendinitis, right 07/02/2016   History of adenomatous polyp of colon    History of esophageal stricture    History of gastric polyp 11/2017   History of PCR DNA positive for HSV2 11/12/2016   History of primary hyperparathyroidism    s/p  left parathryoidectomy 09-17-2008-- resolved   Hyperlipidemia    Hypertension    Insomnia    Lumbar stenosis    Osteopenia    PONV (postoperative nausea and vomiting)    Schatzki's ring    Scoliosis    Tendinopathy of right biceps tendon 07/02/2016   Thickened endometrium    Uterine fibroid    UTI (urinary tract infection) due to Enterococcus 01/05/2020   Wears glasses    Past Surgical History:  Procedure Laterality Date   BREAST REDUCTION SURGERY Bilateral 2000   COLONOSCOPY     CYST EXCISION  1985   head and rt axilla   DILATATION &  CURETTAGE/HYSTEROSCOPY WITH MYOSURE N/A 08/13/2018   Procedure: DILATATION & CURETTAGE/HYSTEROSCOPY WITH MYOSURE;  Surgeon: Lavoie, Marie-Lyne, MD;  Location: [facility];  Service: Gynecology;  Laterality: N/A;  requests 10:15am in [place] Gyn block request 30 minutes   DILATION AND CURETTAGE OF UTERUS N/A 08/13/2018   ECTOPIC PREGNANCY SURGERY  1981   OPEN SURGICAL REPAIR OF GLUTEAL TENDON Right 07/02/2023   Procedure: REPAIR, TENDON, GLUTEUS MEDIUS, OPEN;  Surgeon: Genelle Standing, MD;  Location: [facility];  Service: Orthopedics;  Laterality: Right;  RIGHT GLUTEUS MAXIMUS TENDON TRANSFER WITH COLLAGEN PATCH AUGMENTATION   PARATHYROIDECTOMY Left 09/17/2008   dr jeoffrey dawn  @[facility]    left superior  (adenoma)   REDUCTION MAMMAPLASTY Bilateral 2000   years ago   SHOULDER SURGERY Right 2007   frozen   UPPER GASTROINTESTINAL ENDOSCOPY  last one 12-18-2017   dr wilhelmenia   Patient Active Problem List   Diagnosis Date Noted   Tendinopathy of gluteus medius 07/02/2023   Greater trochanteric bursitis of right hip 04/17/2022   C. difficile diarrhea 03/08/2022   Arthritis of right sacroiliac joint 02/26/2022   Encounter for monitoring long-term proton pump inhibitor therapy 07/05/2021  Hx of colonic polyps 06/06/2021   Incontinence of feces with fecal urgency 06/06/2021   Sleep disorder 11/29/2020   Benign paroxysmal positional vertigo of right ear 08/30/2020   AC (acromioclavicular) arthritis 10/22/2019   Adhesive bursitis of left shoulder 07/22/2019   GERD with esophagitis 12/30/2017   Hyperlipidemia 05/15/2017   Morbid obesity (HCC) 11/12/2016   Essential hypertension, benign 10/06/2014   Depression with anxiety 10/06/2014   Stress incontinence 10/06/2014   Osteoporosis 10/06/2014   Degenerative disc disease, lumbar 09/09/2012   Spinal stenosis of lumbar region 09/09/2012    PCP: Charlies Bellini, DO  REFERRING PROVIDER: Elspeth Parker, MD  REFERRING  DIAG: post-op REPAIR, TENDON, GLUTEUS MEDIUS, OPEN (Right) - RIGHT GLUTEUS MAXIMUS TENDON TRANSFER WITH COLLAGEN PATCH AUGMENTATION  Rationale for Evaluation and Treatment: Rehabilitation  THERAPY DIAG:  Muscle weakness (generalized)  Difficulty in walking, not elsewhere classified  Pain in right hip  ONSET DATE: 07/02/23 surgery date - chronic pain and weakness prior to surgery  SUBJECTIVE:                                                                                                                                                                                           SUBJECTIVE STATEMENT: My power went off and I didn't realize I was late.  I do need to leave a little early too.  A little sore after last time.   PAIN: 10/24/23  Are you having pain? Yes NPRS scale: 2/10 soreness Pain location: Rt lateral hip Pain orientation: Right  PAIN TYPE: aching Pain description: intermittent, dull, and aching  Aggravating factors: laying on Rt side, in/out of car, walking Relieving factors: sitting   PRECAUTIONS: Other: protocol from Dr. Parker scanned into media section of chart - avoid until 6 weeks post-op which is 08/13/23: passive ADDuction and ER, active ABDuction and IR  RED FLAGS: None   WEIGHT BEARING RESTRICTIONS: PWB initially, doctor just cleared to get rid of walking stick when ready  FALLS:  Has patient fallen in last 6 months? Yes. Number of falls 1, while carrying a planter and going up stairs  LIVING ENVIRONMENT: Lives with: lives with their spouse Lives in: House/apartment Stairs: Yes: External: 3 steps; on left going up Has following equipment at home: Single point cane  OCCUPATION: retired  PLOF: Independent  PATIENT GOALS: walk without pain and walking stick, can I get back to pickleball?  NEXT MD VISIT: 3 weeks  OBJECTIVE:  Note: Objective measures were completed at Evaluation unless otherwise noted.  DIAGNOSTIC FINDINGS:  Xray (3 views right  hip): No femoral acetabular osteoarthritis   MRI (right hip): There is an  end-stage full-thickness tear of the gluteus medius with significant atrophy on T1 coronal view  PATIENT SURVEYS:  LEFS: 44/80 7/16: LEFS  42/80 9/4: 32/80  COGNITION: Overall cognitive status: Within functional limits for tasks assessed     SENSATION: WFL  MUSCLE LENGTH:   POSTURE: flexed trunk   PALPATION: Mild tenderness around incision - steri strips placed today after stitches were removed  LUMBAR ROM:  WFL  LOWER EXTREMITY ROM:    Pt able to perform seated march A/ROM and quadruped rocking for hip flexion beyond 90 deg without pain  LOWER EXTREMITY MMT:   Lt LE 4+/5 Rt hip at least 3+/5 - isometric testing only, resisted isometrics strong and painfree   FUNCTIONAL TESTS:  Eval: 5 times sit to stand: 13.23 Timed up and go (TUG): 12.24 using walking stick 2 MWT: 315 feet using single walking stick in Lt UE  08/01/2023: 6 minute walk test:  759 ft with [facility] 08/07/23: 6 minute walk test: 781' with [facility] 7/15: 5x STS with hands: 10.40 5x STS no hands 11.19 TUG: with cane: 9.29 sec  09/05/23: 6 MWT 1010 feet with SPC 5x STS no hands 9.94 GAIT: Distance walked: 315' in 2 min  Assistive device utilized: single walking stick Level of assistance: Modified independence Comments: Rt early stance phase Trendelenburg  TREATMENT DATE: 10/24/23 Nu-Step  level 5 x 6 minutes- PT present to discuss progress  6 inch step ups  right leading 10x 2 hand support WB on right with step taps 10x  Side stepping with pink power cord 8x right/left Side wall plank left: with left hip flexion 2x10 Side wall plank to the left with left hip abduction 2x10 At the stairs 2nd step hip flexor, gastroc and quadratus lumborum muscle lengthening 3 sets of 5 right/left: Rocking forward and back with arm reach up and over  10/22/23 Bike: level 2x 6 minutes- PT present to discuss progress  Side step cable 5 # with belt  7x right/left At the stairs 2nd step hip flexor, gastroc and quadratus lumborum muscle lengthening 3 sets of 5 right/left: Rocking forward and back with arm reach up and over 2 rounds 1 min rest break b/w rounds:   10 lateral sliders each side right/left 6 clockwise and counterclock slider circles right/left 10 single leg march with 5# weight in same side hand right/left   Side wall plank:with left hip flexion 10x needs UE support  Bridge with feet on 8 inch step 10x Supine with heel taps for core activation 2x10 Side bridge 8x right/left   10/17/23 Nustep level 5x 8 min- PT present to discuss progress  Step up on foam pad: forward and lateral 2x10 bil each Star lunges with slider under Lt foot: x10  Lunge onto Rt on Bosu with min UE support- forward and lateral  Sidestepping with red loop around thighs along barre x4 laps Sit to stand with 10# kettle bell parallel stance and staggered stance bil x10 Leg press seat 6 85# bil x20; 45# single leg 2x10 right/left 6 inch step ups 1 hand support needed 2x10 bil Figure 4 3x20 seconds   10/15/23 Bike: level 3x 5 minutes- PT present to discuss progress  Step up on foam pad: forward and lateral 2x10 bil each Star lunges with slider under Lt foot: x10  Lunge onto Rt on Bosu with min UE support  Sit to stand with 10# kettle bell parallel stance and staggered stance bil x10   Side bridge/plank 5x (challenging) on other side  with clam 5x Prone modified plank elbows and knees 5x 5 sec holds Leg press seat 6 85# bil x20; 45# single leg 2x10 right/left 6 inch step ups 1 hand support needed 2x10 bil    PATIENT EDUCATION:  Education details: [email or web address] Person educated: Patient Education method: Explanation, Demonstration, Verbal cues, and Handouts Education comprehension: verbalized understanding and returned demonstration  HOME EXERCISE PROGRAM: Access Code: [email or web address] URL: [email or web address] Date: 07/12/2023 Prepared by:  Orvil Beuhring  Exercises - Supine Posterior Pelvic Tilt  - 2 x daily - 7 x weekly - 2 sets - 10 reps - Supine Short Arc Quad  - 2 x daily - 7 x weekly - 2 sets - 10 reps - Supine Hip Adduction Isometric with Ball  - 2 x daily - 7 x weekly - 2 sets - 10 reps - 5 hold - Quadruped Rocking Backward  - 2 x daily - 7 x weekly - 1-2 sets - 10 reps - 5 hold - Seated Isometric Hip Abduction  - 2 x daily - 7 x weekly - 2 sets - 10 reps - 5 hold - Standing Knee Flexion AROM with Chair Support  - 2 x daily - 7 x weekly - 2 sets - 10 reps  ASSESSMENT:  CLINICAL IMPRESSION: Shortened session due to late arrival.  Treatment focus on hip strengthening particularly gluteus medius.  She responds well to a lateral wall plank to decrease weightbearing load on right and is able to keep pelvis level.  Verbal cues for technique and to reduce UE support.  She is considering a tapered schedule from PT to promote independence and self efficacy.  OBJECTIVE IMPAIRMENTS: Abnormal gait, decreased balance, decreased coordination, difficulty walking, decreased strength, increased edema, impaired flexibility, improper body mechanics, postural dysfunction, and pain.   ACTIVITY LIMITATIONS: lifting, bending, squatting, stairs, and locomotion level  PARTICIPATION LIMITATIONS: cleaning, laundry, shopping, community activity, and yard work  PERSONAL FACTORS: Age are also affecting patient's functional outcome.   REHAB POTENTIAL: Excellent  CLINICAL DECISION MAKING: Evolving/moderate complexity  EVALUATION COMPLEXITY: Moderate   GOALS: Goals reviewed with patient? Yes  SHORT TERM GOALS: Target date: 08/09/23  Pt will be ind with initial HEP Baseline: Goal status: Met on 08/01/23  2.  Pt will be educated on movements to avoid to follow post-op protocol to protect tendon transfer site Baseline:  Goal status: MET  3.  Pt will demo improved Rt hip control in stance phase of gait with single walking stick as  needed. Baseline:  Goal status: MET 08/07/23    LONG TERM GOALS: Target date: 10/31/2023   Pt will be ind with advanced HEP Baseline:  Goal status: ongoing  2.  Pt will improve LEFS score to at least 55/80 to demo improved function Baseline: 44/80 Goal status: ongoing  3.  Pt will perform 6 min walk test covering at least 700' with walking stick as needed Baseline:  Goal status: MET 08/07/23 with walking stick  4.  Pt will be able to demo squat, lift 10lb, carry 10lb and walk to mimic household chores and yard activities with proper body mechanics without pain. Baseline:  Goal status: ongoing  5.  Pt will improve glut strength to at least 4+/5 to improve gait mechanics, stairs and transfers. Baseline:  Goal status: Progressing   6.  5x STS improved to 11 sec or less to demo improved functional strength and reduce fall risk. Baseline: 13.23 Goal status: ongoing  PLAN:  PT FREQUENCY: 2x/week  PT DURATION: 8 weeks  PLANNED INTERVENTIONS: 97110-Therapeutic exercises, 97530- Therapeutic activity, [id number]- Neuromuscular re-education, 97535- Self Care, [id number]- Manual therapy, [phone]- Gait training, Patient/Family education, Balance training, Stair training, Cryotherapy, and Moist heat.  PLAN FOR NEXT SESSION: check goals next week to determine D/C vs renewal next week. Sees the doctor in mid Oct.; Mom's birthday party Oct 11;  hip machine; leg press; unilateral cable column rotations, step downs, side stepping with band;  gluteal muscle activation; hip mobilizations; Continue to work on gait endurance, Hip stability exercises, weight bearing  Glade Pesa, PT 10/24/23 12:29 PM Phone: [phone] Fax: [phone]  [facility] [address] [place], [place] [address] Phone # [phone] Fax [phone]

## 2023-10-29 ENCOUNTER — Ambulatory Visit

## 2023-10-29 DIAGNOSIS — M25551 Pain in right hip: Secondary | ICD-10-CM

## 2023-10-29 DIAGNOSIS — M6281 Muscle weakness (generalized): Secondary | ICD-10-CM

## 2023-10-29 DIAGNOSIS — R252 Cramp and spasm: Secondary | ICD-10-CM

## 2023-10-29 DIAGNOSIS — R262 Difficulty in walking, not elsewhere classified: Secondary | ICD-10-CM

## 2023-10-29 NOTE — Therapy (Signed)
 OUTPATIENT PHYSICAL THERAPY TREATMENT    Patient Name: Rebecca Long MRN: [id number] DOB:[birth year], 73 y.o., female Today's Date: 10/29/2023      END OF SESSION:  PT End of Session - 10/29/23 1232     Visit Number 27    Date for Recertification  10/31/23    Authorization Type HTA no auth required    Progress Note Due on Visit 30    PT Start Time 1148    PT Stop Time 1230    PT Time Calculation (min) 42 min    Activity Tolerance Patient tolerated treatment well    Behavior During Therapy WFL for tasks assessed/performed                Past Medical History:  Diagnosis Date   Anxiety    Cavus foot, acquired 09/09/2012    bilateral cavus feet the one on the left shows that she has some probable neurogenic weakness with abnormal curvature and in inversion contracture Podiatry 06/2015: H&P and x-ray reviewed with patient. Today I went ahead and I did a proximal nerve block I was able to aspirate the second MPJ and got out amount of clear fluid and injected with a quarter cc dexamethasone  Kenalog  and ap   DDD (degenerative disc disease), lumbar    Depression    Endometrial polyp    GERD (gastroesophageal reflux disease)    Hiatal hernia    Hip flexor tendinitis, right 07/02/2016   History of adenomatous polyp of colon    History of esophageal stricture    History of gastric polyp 11/2017   History of PCR DNA positive for HSV2 11/12/2016   History of primary hyperparathyroidism    s/p  left parathryoidectomy 09-17-2008-- resolved   Hyperlipidemia    Hypertension    Insomnia    Lumbar stenosis    Osteopenia    PONV (postoperative nausea and vomiting)    Schatzki's ring    Scoliosis    Tendinopathy of right biceps tendon 07/02/2016   Thickened endometrium    Uterine fibroid    UTI (urinary tract infection) due to Enterococcus 01/05/2020   Wears glasses    Past Surgical History:  Procedure Laterality Date   BREAST REDUCTION SURGERY Bilateral 2000   COLONOSCOPY      CYST EXCISION  1985   head and rt axilla   DILATATION & CURETTAGE/HYSTEROSCOPY WITH MYOSURE N/A 08/13/2018   Procedure: DILATATION & CURETTAGE/HYSTEROSCOPY WITH MYOSURE;  Surgeon: Lavoie, Marie-Lyne, MD;  Location: [facility];  Service: Gynecology;  Laterality: N/A;  requests 10:15am in [place] Gyn block request 30 minutes   DILATION AND CURETTAGE OF UTERUS N/A 08/13/2018   ECTOPIC PREGNANCY SURGERY  1981   OPEN SURGICAL REPAIR OF GLUTEAL TENDON Right 07/02/2023   Procedure: REPAIR, TENDON, GLUTEUS MEDIUS, OPEN;  Surgeon: Genelle Standing, MD;  Location: [facility];  Service: Orthopedics;  Laterality: Right;  RIGHT GLUTEUS MAXIMUS TENDON TRANSFER WITH COLLAGEN PATCH AUGMENTATION   PARATHYROIDECTOMY Left 09/17/2008   dr jeoffrey dawn  @[facility]    left superior  (adenoma)   REDUCTION MAMMAPLASTY Bilateral 2000   years ago   SHOULDER SURGERY Right 2007   frozen   UPPER GASTROINTESTINAL ENDOSCOPY  last one 12-18-2017   dr wilhelmenia   Patient Active Problem List   Diagnosis Date Noted   Tendinopathy of gluteus medius 07/02/2023   Greater trochanteric bursitis of right hip 04/17/2022   C. difficile diarrhea 03/08/2022   Arthritis of right sacroiliac joint 02/26/2022  Encounter for monitoring long-term proton pump inhibitor therapy 07/05/2021   Hx of colonic polyps 06/06/2021   Incontinence of feces with fecal urgency 06/06/2021   Sleep disorder 11/29/2020   Benign paroxysmal positional vertigo of right ear 08/30/2020   AC (acromioclavicular) arthritis 10/22/2019   Adhesive bursitis of left shoulder 07/22/2019   GERD with esophagitis 12/30/2017   Hyperlipidemia 05/15/2017   Morbid obesity (HCC) 11/12/2016   Essential hypertension, benign 10/06/2014   Depression with anxiety 10/06/2014   Stress incontinence 10/06/2014   Osteoporosis 10/06/2014   Degenerative disc disease, lumbar 09/09/2012   Spinal stenosis of lumbar region 09/09/2012    PCP: Charlies Bellini, DO  REFERRING PROVIDER: Elspeth Parker, MD  REFERRING DIAG: post-op REPAIR, TENDON, GLUTEUS MEDIUS, OPEN (Right) - RIGHT GLUTEUS MAXIMUS TENDON TRANSFER WITH COLLAGEN PATCH AUGMENTATION  Rationale for Evaluation and Treatment: Rehabilitation  THERAPY DIAG:  Muscle weakness (generalized)  Difficulty in walking, not elsewhere classified  Pain in right hip  Cramp and spasm  ONSET DATE: 07/02/23 surgery date - chronic pain and weakness prior to surgery  SUBJECTIVE:                                                                                                                                                                                           SUBJECTIVE STATEMENT: My power went off and I didn't realize I was late.  I do need to leave a little early too.  A little sore after last time.   PAIN: 10/24/23  Are you having pain? Yes NPRS scale: 2/10 soreness Pain location: Rt lateral hip Pain orientation: Right  PAIN TYPE: aching Pain description: intermittent, dull, and aching  Aggravating factors: laying on Rt side, in/out of car, walking Relieving factors: sitting   PRECAUTIONS: Other: protocol from Dr. Parker scanned into media section of chart - avoid until 6 weeks post-op which is 08/13/23: passive ADDuction and ER, active ABDuction and IR  RED FLAGS: None   WEIGHT BEARING RESTRICTIONS: PWB initially, doctor just cleared to get rid of walking stick when ready  FALLS:  Has patient fallen in last 6 months? Yes. Number of falls 1, while carrying a planter and going up stairs  LIVING ENVIRONMENT: Lives with: lives with their spouse Lives in: House/apartment Stairs: Yes: External: 3 steps; on left going up Has following equipment at home: Single point cane  OCCUPATION: retired  PLOF: Independent  PATIENT GOALS: walk without pain and walking stick, can I get back to pickleball?  NEXT MD VISIT: 3 weeks  OBJECTIVE:  Note: Objective measures were completed at  Evaluation unless otherwise noted.  DIAGNOSTIC FINDINGS:  Xray (3  views right hip): No femoral acetabular osteoarthritis   MRI (right hip): There is an end-stage full-thickness tear of the gluteus medius with significant atrophy on T1 coronal view  PATIENT SURVEYS:  LEFS: 44/80 7/16: LEFS  42/80 9/4: 32/80  COGNITION: Overall cognitive status: Within functional limits for tasks assessed     SENSATION: WFL  MUSCLE LENGTH:   POSTURE: flexed trunk   PALPATION: Mild tenderness around incision - steri strips placed today after stitches were removed  LUMBAR ROM:  WFL  LOWER EXTREMITY ROM:    Pt able to perform seated march A/ROM and quadruped rocking for hip flexion beyond 90 deg without pain  LOWER EXTREMITY MMT:   Lt LE 4+/5 Rt hip at least 3+/5 - isometric testing only, resisted isometrics strong and painfree   FUNCTIONAL TESTS:  Eval: 5 times sit to stand: 13.23 Timed up and go (TUG): 12.24 using walking stick 2 MWT: 315 feet using single walking stick in Lt UE  08/01/2023: 6 minute walk test:  759 ft with [facility] 08/07/23: 6 minute walk test: 781' with [facility] 7/15: 5x STS with hands: 10.40 5x STS no hands 11.19 TUG: with cane: 9.29 sec  09/05/23: 6 MWT 1010 feet with SPC 5x STS no hands 9.94 GAIT: Distance walked: 315' in 2 min  Assistive device utilized: single walking stick Level of assistance: Modified independence Comments: Rt early stance phase Trendelenburg  TREATMENT DATE: 10/29/23 Nu-Step  level 5 x 6 minutes- PT present to discuss progress  6 inch step ups  right leading 10x 2 hand support, lateral step up x10 on Rt  WB on right with step taps 10x using 8 steps Single leg stance on Rt with lunge taps to colored discs on floor-forward and lateral bil  Resisted walking -Side stepping: 10# x8 each with close CGA Leg press seat 6 85# bil 2x10; 45# single leg 2x10 right/left At the stairs 2nd step hip flexor, gastroc and quadratus lumborum muscle  lengthening 3 sets of 5 right/left: Rocking forward and back with arm reach up and over Sidestepping lateral over hurdles x4 laps  10/24/23  Nu-Step  level 5 x 6 minutes- PT present to discuss progress  6 inch step ups  right leading 10x 2 hand support WB on right with step taps 10x  Side stepping with pink power cord 8x right/left Side wall plank left: with left hip flexion 2x10 Side wall plank to the left with left hip abduction 2x10 At the stairs 2nd step hip flexor, gastroc and quadratus lumborum muscle lengthening 3 sets of 5 right/left: Rocking forward and back with arm reach up and over 10/22/23 Bike: level 2x 6 minutes- PT present to discuss progress  Side step cable 5 # with belt 7x right/left At the stairs 2nd step hip flexor, gastroc and quadratus lumborum muscle lengthening 3 sets of 5 right/left: Rocking forward and back with arm reach up and over 2 rounds 1 min rest break b/w rounds:   10 lateral sliders each side right/left 6 clockwise and counterclock slider circles right/left 10 single leg march with 5# weight in same side hand right/left   Side wall plank:with left hip flexion 10x needs UE support  Bridge with feet on 8 inch step 10x Supine with heel taps for core activation 2x10 Side bridge 8x right/left  PATIENT EDUCATION:  Education details: [email or web address] Person educated: Patient Education method: Explanation, Demonstration, Verbal cues, and Handouts Education comprehension: verbalized understanding and returned demonstration  HOME EXERCISE PROGRAM: Access Code: [email or web address] URL:  [email or web address] Date: 07/12/2023 Prepared by: Orvil Beuhring  Exercises - Supine Posterior Pelvic Tilt  - 2 x daily - 7 x weekly - 2 sets - 10 reps - Supine Short Arc Quad  - 2 x daily - 7 x weekly - 2 sets - 10 reps - Supine Hip Adduction Isometric with Ball  - 2 x daily - 7 x weekly - 2 sets - 10 reps - 5 hold - Quadruped Rocking Backward  - 2 x daily - 7 x weekly -  1-2 sets - 10 reps - 5 hold - Seated Isometric Hip Abduction  - 2 x daily - 7 x weekly - 2 sets - 10 reps - 5 hold - Standing Knee Flexion AROM with Chair Support  - 2 x daily - 7 x weekly - 2 sets - 10 reps  ASSESSMENT:  CLINICAL IMPRESSION: Pt remains frustrated at lack of progress regarding Rt LE stability and strength.  Treatment focus on hip strengthening particularly gluteus medius.  Pt is challenged and requires UE support for stability. She will decide what to do about future PT before returning next session.  We discussed plateau and continuing gym exercises for gains.  She is considering a tapered schedule from PT to promote independence and self efficacy.  Patient will benefit from skilled PT to address the below impairments and improve overall function.   OBJECTIVE IMPAIRMENTS: Abnormal gait, decreased balance, decreased coordination, difficulty walking, decreased strength, increased edema, impaired flexibility, improper body mechanics, postural dysfunction, and pain.   ACTIVITY LIMITATIONS: lifting, bending, squatting, stairs, and locomotion level  PARTICIPATION LIMITATIONS: cleaning, laundry, shopping, community activity, and yard work  PERSONAL FACTORS: Age are also affecting patient's functional outcome.   REHAB POTENTIAL: Excellent  CLINICAL DECISION MAKING: Evolving/moderate complexity  EVALUATION COMPLEXITY: Moderate   GOALS: Goals reviewed with patient? Yes  SHORT TERM GOALS: Target date: 08/09/23  Pt will be ind with initial HEP Baseline: Goal status: Met on 08/01/23  2.  Pt will be educated on movements to avoid to follow post-op protocol to protect tendon transfer site Baseline:  Goal status: MET  3.  Pt will demo improved Rt hip control in stance phase of gait with single walking stick as needed. Baseline:  Goal status: MET 08/07/23    LONG TERM GOALS: Target date: 10/31/2023   Pt will be ind with advanced HEP Baseline:  Goal status: ongoing  2.  Pt  will improve LEFS score to at least 55/80 to demo improved function Baseline: 44/80 Goal status: ongoing  3.  Pt will perform 6 min walk test covering at least 700' with walking stick as needed Baseline:  Goal status: MET 08/07/23 with walking stick  4.  Pt will be able to demo squat, lift 10lb, carry 10lb and walk to mimic household chores and yard activities with proper body mechanics without pain. Baseline:  Goal status: ongoing  5.  Pt will improve glut strength to at least 4+/5 to improve gait mechanics, stairs and transfers. Baseline:  Goal status: Progressing   6.  5x STS improved to 11 sec or less to demo improved functional strength and reduce fall risk. Baseline: 13.23 Goal status: ongoing  PLAN:  PT FREQUENCY: 2x/week  PT DURATION: 8 weeks  PLANNED INTERVENTIONS: 97110-Therapeutic exercises, 97530- Therapeutic activity, [id number]- Neuromuscular re-education, 97535- Self Care, [id number]- Manual therapy, [phone]- Gait training, Patient/Family education, Balance training, Stair training, Cryotherapy, and Moist heat.  PLAN FOR NEXT SESSION: ERO next-pt sees the doctor in mid Oct.; Mom's birthday  party Oct 11;  hip machine; leg press; unilateral cable column rotations, step downs, side stepping with band;  gluteal muscle activation; hip mobilizations; Continue to work on gait endurance, Hip stability exercises, weight bearing  Burnard Joy, PT 10/29/23 12:34 PM   [facility] [address] [place], [place] [address] Phone # [phone] Fax [phone]

## 2023-10-31 ENCOUNTER — Ambulatory Visit: Attending: Orthopaedic Surgery | Admitting: Physical Therapy

## 2023-10-31 DIAGNOSIS — R262 Difficulty in walking, not elsewhere classified: Secondary | ICD-10-CM | POA: Diagnosis not present

## 2023-10-31 DIAGNOSIS — M6281 Muscle weakness (generalized): Secondary | ICD-10-CM | POA: Diagnosis not present

## 2023-10-31 DIAGNOSIS — M25551 Pain in right hip: Secondary | ICD-10-CM | POA: Diagnosis not present

## 2023-10-31 NOTE — Therapy (Signed)
 OUTPATIENT PHYSICAL THERAPY TREATMENT   Patient Name: Rebecca Long MRN: [id number] DOB:[birth year], 73 y.o., female Today's Date: 10/31/2023      END OF SESSION:  PT End of Session - 10/31/23 1149     Visit Number 28    Date for Recertification  10/31/23    Authorization Type HTA no auth required    Progress Note Due on Visit 30    PT Start Time 1148    PT Stop Time 1229    PT Time Calculation (min) 41 min    Activity Tolerance Patient tolerated treatment well                Past Medical History:  Diagnosis Date   Anxiety    Cavus foot, acquired 09/09/2012    bilateral cavus feet the one on the left shows that she has some probable neurogenic weakness with abnormal curvature and in inversion contracture Podiatry 06/2015: H&P and x-ray reviewed with patient. Today I went ahead and I did a proximal nerve block I was able to aspirate the second MPJ and got out amount of clear fluid and injected with a quarter cc dexamethasone  Kenalog  and ap   DDD (degenerative disc disease), lumbar    Depression    Endometrial polyp    GERD (gastroesophageal reflux disease)    Hiatal hernia    Hip flexor tendinitis, right 07/02/2016   History of adenomatous polyp of colon    History of esophageal stricture    History of gastric polyp 11/2017   History of PCR DNA positive for HSV2 11/12/2016   History of primary hyperparathyroidism    s/p  left parathryoidectomy 09-17-2008-- resolved   Hyperlipidemia    Hypertension    Insomnia    Lumbar stenosis    Osteopenia    PONV (postoperative nausea and vomiting)    Schatzki's ring    Scoliosis    Tendinopathy of right biceps tendon 07/02/2016   Thickened endometrium    Uterine fibroid    UTI (urinary tract infection) due to Enterococcus 01/05/2020   Wears glasses    Past Surgical History:  Procedure Laterality Date   BREAST REDUCTION SURGERY Bilateral 2000   COLONOSCOPY     CYST EXCISION  1985   head and rt axilla   DILATATION &  CURETTAGE/HYSTEROSCOPY WITH MYOSURE N/A 08/13/2018   Procedure: DILATATION & CURETTAGE/HYSTEROSCOPY WITH MYOSURE;  Surgeon: Lavoie, Marie-Lyne, MD;  Location: [facility];  Service: Gynecology;  Laterality: N/A;  requests 10:15am in [place] Gyn block request 30 minutes   DILATION AND CURETTAGE OF UTERUS N/A 08/13/2018   ECTOPIC PREGNANCY SURGERY  1981   OPEN SURGICAL REPAIR OF GLUTEAL TENDON Right 07/02/2023   Procedure: REPAIR, TENDON, GLUTEUS MEDIUS, OPEN;  Surgeon: Genelle Standing, MD;  Location: [facility];  Service: Orthopedics;  Laterality: Right;  RIGHT GLUTEUS MAXIMUS TENDON TRANSFER WITH COLLAGEN PATCH AUGMENTATION   PARATHYROIDECTOMY Left 09/17/2008   dr jeoffrey dawn  @[facility]    left superior  (adenoma)   REDUCTION MAMMAPLASTY Bilateral 2000   years ago   SHOULDER SURGERY Right 2007   frozen   UPPER GASTROINTESTINAL ENDOSCOPY  last one 12-18-2017   dr wilhelmenia   Patient Active Problem List   Diagnosis Date Noted   Tendinopathy of gluteus medius 07/02/2023   Greater trochanteric bursitis of right hip 04/17/2022   C. difficile diarrhea 03/08/2022   Arthritis of right sacroiliac joint 02/26/2022   Encounter for monitoring long-term proton pump inhibitor therapy 07/05/2021  Hx of colonic polyps 06/06/2021   Incontinence of feces with fecal urgency 06/06/2021   Sleep disorder 11/29/2020   Benign paroxysmal positional vertigo of right ear 08/30/2020   AC (acromioclavicular) arthritis 10/22/2019   Adhesive bursitis of left shoulder 07/22/2019   GERD with esophagitis 12/30/2017   Hyperlipidemia 05/15/2017   Morbid obesity (HCC) 11/12/2016   Essential hypertension, benign 10/06/2014   Depression with anxiety 10/06/2014   Stress incontinence 10/06/2014   Osteoporosis 10/06/2014   Degenerative disc disease, lumbar 09/09/2012   Spinal stenosis of lumbar region 09/09/2012    PCP: Charlies Bellini, DO  REFERRING PROVIDER: Elspeth Parker, MD  REFERRING  DIAG: post-op REPAIR, TENDON, GLUTEUS MEDIUS, OPEN (Right) - RIGHT GLUTEUS MAXIMUS TENDON TRANSFER WITH COLLAGEN PATCH AUGMENTATION  Rationale for Evaluation and Treatment: Rehabilitation  THERAPY DIAG:  Muscle weakness (generalized)  Difficulty in walking, not elsewhere classified  Pain in right hip  ONSET DATE: 07/02/23 surgery date - chronic pain and weakness prior to surgery  SUBJECTIVE:                                                                                                                                                                                           SUBJECTIVE STATEMENT: Patient presents without her cane.  Going to [place]  for her mother's 95th birthday then will follow up with the surgeon when she returns. I don't really feel like doing the tests today.   PAIN: 10/31/23  Are you having pain? Yes NPRS scale: 2/10 soreness Pain location: Rt lateral hip Pain orientation: Right  PAIN TYPE: aching Pain description: intermittent, dull, and aching  Aggravating factors: laying on Rt side, in/out of car, walking Relieving factors: sitting   PRECAUTIONS: Other: protocol from Dr. Parker scanned into media section of chart - avoid until 6 weeks post-op which is 08/13/23: passive ADDuction and ER, active ABDuction and IR  RED FLAGS: None   WEIGHT BEARING RESTRICTIONS: PWB initially, doctor just cleared to get rid of walking stick when ready  FALLS:  Has patient fallen in last 6 months? Yes. Number of falls 1, while carrying a planter and going up stairs  LIVING ENVIRONMENT: Lives with: lives with their spouse Lives in: House/apartment Stairs: Yes: External: 3 steps; on left going up Has following equipment at home: Single point cane  OCCUPATION: retired  PLOF: Independent  PATIENT GOALS: walk without pain and walking stick, can I get back to pickleball?  NEXT MD VISIT: 3 weeks  OBJECTIVE:  Note: Objective measures were completed at Evaluation unless  otherwise noted.  DIAGNOSTIC FINDINGS:  Xray (3 views right hip): No femoral acetabular osteoarthritis   MRI (  right hip): There is an end-stage full-thickness tear of the gluteus medius with significant atrophy on T1 coronal view  PATIENT SURVEYS:  LEFS: 44/80 7/16: LEFS  42/80 9/4: 32/80  COGNITION: Overall cognitive status: Within functional limits for tasks assessed     SENSATION: WFL  MUSCLE LENGTH:   POSTURE: flexed trunk   PALPATION: Mild tenderness around incision - steri strips placed today after stitches were removed  LUMBAR ROM:  WFL  LOWER EXTREMITY ROM:    Pt able to perform seated march A/ROM and quadruped rocking for hip flexion beyond 90 deg without pain  LOWER EXTREMITY MMT:   Lt LE 4+/5 Rt hip at least 3+/5 - isometric testing only, resisted isometrics strong and painfree   FUNCTIONAL TESTS:  Eval: 5 times sit to stand: 13.23 Timed up and go (TUG): 12.24 using walking stick 2 MWT: 315 feet using single walking stick in Lt UE  08/01/2023: 6 minute walk test:  759 ft with [facility] 08/07/23: 6 minute walk test: 781' with [facility] 7/15: 5x STS with hands: 10.40 5x STS no hands 11.19 TUG: with cane: 9.29 sec  09/05/23: 6 MWT 1010 feet with SPC 5x STS no hands 9.94 GAIT: Distance walked: 315' in 2 min  Assistive device utilized: single walking stick Level of assistance: Modified independence Comments: Rt early stance phase Trendelenburg  TREATMENT DATE: 10/31/23 bike x 6 minutes- PT present to discuss progress  Supine blue band hip extension 20x  Prone glute squeeze 10x Prone over 1 pillow hip extension 10x right/left Side planks 10x right only Supine propped on wedge: hip thruster with pair of 5# weights on hips 2 x10 Sit to stand pair of 5# dumbbells Lateral lean to the left on the back of the UBE seat: left hip flexion (partially unloading right LE) Cable walk 10x backwards only with belt: 10x Hip machine 30# right only: hip abduction and  extension 2 x10   10/29/23 Nu-Step  level 5 x 6 minutes- PT present to discuss progress  6 inch step ups  right leading 10x 2 hand support, lateral step up x10 on Rt  WB on right with step taps 10x using 8 steps Single leg stance on Rt with lunge taps to colored discs on floor-forward and lateral bil  Resisted walking -Side stepping: 10# x8 each with close CGA Leg press seat 6 85# bil 2x10; 45# single leg 2x10 right/left At the stairs 2nd step hip flexor, gastroc and quadratus lumborum muscle lengthening 3 sets of 5 right/left: Rocking forward and back with arm reach up and over Sidestepping lateral over hurdles x4 laps  10/24/23  Nu-Step  level 5 x 6 minutes- PT present to discuss progress  6 inch step ups  right leading 10x 2 hand support WB on right with step taps 10x  Side stepping with pink power cord 8x right/left Side wall plank left: with left hip flexion 2x10 Side wall plank to the left with left hip abduction 2x10 At the stairs 2nd step hip flexor, gastroc and quadratus lumborum muscle lengthening 3 sets of 5 right/left: Rocking forward and back with arm reach up and over 10/22/23 Bike: level 2x 6 minutes- PT present to discuss progress  Side step cable 5 # with belt 7x right/left At the stairs 2nd step hip flexor, gastroc and quadratus lumborum muscle lengthening 3 sets of 5 right/left: Rocking forward and back with arm reach up and over 2 rounds 1 min rest break b/w rounds:   10 lateral sliders each  side right/left 6 clockwise and counterclock slider circles right/left 10 single leg march with 5# weight in same side hand right/left   Side wall plank:with left hip flexion 10x needs UE support  Bridge with feet on 8 inch step 10x Supine with heel taps for core activation 2x10 Side bridge 8x right/left  PATIENT EDUCATION:  Education details: [email or web address] Person educated: Patient Education method: Explanation, Demonstration, Verbal cues, and Handouts Education comprehension:  verbalized understanding and returned demonstration  HOME EXERCISE PROGRAM: Access Code: [email or web address] URL: [email or web address] Date: 07/12/2023 Prepared by: Orvil Beuhring  Exercises - Supine Posterior Pelvic Tilt  - 2 x daily - 7 x weekly - 2 sets - 10 reps - Supine Short Arc Quad  - 2 x daily - 7 x weekly - 2 sets - 10 reps - Supine Hip Adduction Isometric with Ball  - 2 x daily - 7 x weekly - 2 sets - 10 reps - 5 hold - Quadruped Rocking Backward  - 2 x daily - 7 x weekly - 1-2 sets - 10 reps - 5 hold - Seated Isometric Hip Abduction  - 2 x daily - 7 x weekly - 2 sets - 10 reps - 5 hold - Standing Knee Flexion AROM with Chair Support  - 2 x daily - 7 x weekly - 2 sets - 10 reps  ASSESSMENT:  CLINICAL IMPRESSION: Treatment focus on activation of the gluteals in multiple positions.  She lacks strength in standing with full weight bearing on right LE with continued pelvic drop.  In non-weight bearing or partial weight bearing she has good muscular activation and contraction. Rebecca Long will be traveling out of the state and then will follow up with her surgeon.  She would like to consult with him to help determine if further PT is needed. She will contact the facility with a status update for recertification if needed or discharge.   OBJECTIVE IMPAIRMENTS: Abnormal gait, decreased balance, decreased coordination, difficulty walking, decreased strength, increased edema, impaired flexibility, improper body mechanics, postural dysfunction, and pain.   ACTIVITY LIMITATIONS: lifting, bending, squatting, stairs, and locomotion level  PARTICIPATION LIMITATIONS: cleaning, laundry, shopping, community activity, and yard work  PERSONAL FACTORS: Age are also affecting patient's functional outcome.   REHAB POTENTIAL: Excellent  CLINICAL DECISION MAKING: Evolving/moderate complexity  EVALUATION COMPLEXITY: Moderate   GOALS: Goals reviewed with patient? Yes  SHORT TERM GOALS:  Target date: 08/09/23  Pt will be ind with initial HEP Baseline: Goal status: Met on 08/01/23  2.  Pt will be educated on movements to avoid to follow post-op protocol to protect tendon transfer site Baseline:  Goal status: MET  3.  Pt will demo improved Rt hip control in stance phase of gait with single walking stick as needed. Baseline:  Goal status: MET 08/07/23    LONG TERM GOALS: Target date: 10/31/2023   Pt will be ind with advanced HEP Baseline:  Goal status: ongoing  2.  Pt will improve LEFS score to at least 55/80 to demo improved function Baseline: 44/80 Goal status: ongoing  3.  Pt will perform 6 min walk test covering at least 700' with walking stick as needed Baseline:  Goal status: MET 08/07/23 with walking stick  4.  Pt will be able to demo squat, lift 10lb, carry 10lb and walk to mimic household chores and yard activities with proper body mechanics without pain. Baseline:  Goal status: ongoing  5.  Pt will improve glut strength to at least 4+/5 to improve gait mechanics,  stairs and transfers. Baseline:  Goal status: Progressing   6.  5x STS improved to 11 sec or less to demo improved functional strength and reduce fall risk. Baseline: 13.23 Goal status: ongoing  PLAN:  PT FREQUENCY: 2x/week  PT DURATION: 8 weeks  PLANNED INTERVENTIONS: 97110-Therapeutic exercises, 97530- Therapeutic activity, [id number]- Neuromuscular re-education, 97535- Self Care, [id number]- Manual therapy, [phone]- Gait training, Patient/Family education, Balance training, Stair training, Cryotherapy, and Moist heat.  PLAN FOR NEXT SESSION: will put on hold while traveling to [place]  and then will see the doctor in mid Oct. Pt will call with status update for ERO vs. discharge  hip machine; leg press; unilateral cable column rotations, step downs, side stepping with band;  gluteal muscle activation; hip mobilizations; Continue to work on gait endurance, Hip stability exercises, weight  bearing  Glade Pesa, PT 10/31/23 10:04 PM Phone: [phone] Fax: [phone]  [facility] [address] [place], [place] [address] Phone # [phone] Fax [phone]

## 2023-11-14 ENCOUNTER — Encounter (HOSPITAL_BASED_OUTPATIENT_CLINIC_OR_DEPARTMENT_OTHER): Payer: Self-pay

## 2023-11-14 ENCOUNTER — Encounter (HOSPITAL_BASED_OUTPATIENT_CLINIC_OR_DEPARTMENT_OTHER): Admitting: Orthopaedic Surgery

## 2023-11-14 ENCOUNTER — Ambulatory Visit (HOSPITAL_BASED_OUTPATIENT_CLINIC_OR_DEPARTMENT_OTHER): Admitting: Orthopaedic Surgery

## 2023-11-20 ENCOUNTER — Ambulatory Visit (HOSPITAL_BASED_OUTPATIENT_CLINIC_OR_DEPARTMENT_OTHER): Admitting: Orthopaedic Surgery

## 2023-11-20 ENCOUNTER — Other Ambulatory Visit (HOSPITAL_BASED_OUTPATIENT_CLINIC_OR_DEPARTMENT_OTHER): Payer: Self-pay

## 2023-11-20 DIAGNOSIS — S76011A Strain of muscle, fascia and tendon of right hip, initial encounter: Secondary | ICD-10-CM

## 2023-11-20 MED ORDER — LIDOCAINE HCL 1 % IJ SOLN
4.0000 mL | INTRAMUSCULAR | Status: AC | PRN
  Administered 2023-11-20: 4 mL

## 2023-11-20 MED ORDER — TRIAMCINOLONE ACETONIDE 40 MG/ML IJ SUSP
80.0000 mg | INTRAMUSCULAR | Status: AC | PRN
  Administered 2023-11-20: 80 mg via INTRA_ARTICULAR

## 2023-11-20 NOTE — Progress Notes (Signed)
 Post Operative Evaluation    Procedure/Date of Surgery: Right hip gluteus maximus tendon transfer 6/3  Interval History:    Presents today status post above procedure.  Overall she is improving pain wise but is still having some pain with sleeping.  There is persistent weakness and limp   PMH/PSH/Family History/Social History/Meds/Allergies:    Past Medical History:  Diagnosis Date  . Anxiety   . Cavus foot, acquired 09/09/2012    bilateral cavus feet the one on the left shows that she has some probable neurogenic weakness with abnormal curvature and in inversion contracture Podiatry 06/2015: H&P and x-ray reviewed with patient. Today I went ahead and I did a proximal nerve block I was able to aspirate the second MPJ and got out amount of clear fluid and injected with a quarter cc dexamethasone  Kenalog  and ap  . DDD (degenerative disc disease), lumbar   . Depression   . Endometrial polyp   . GERD (gastroesophageal reflux disease)   . Hiatal hernia   . Hip flexor tendinitis, right 07/02/2016  . History of adenomatous polyp of colon   . History of esophageal stricture   . History of gastric polyp 11/2017  . History of PCR DNA positive for HSV2 11/12/2016  . History of primary hyperparathyroidism    s/p  left parathryoidectomy 09-17-2008-- resolved  . Hyperlipidemia   . Hypertension   . Insomnia   . Lumbar stenosis   . Osteopenia   . PONV (postoperative nausea and vomiting)   . Schatzki's ring   . Scoliosis   . Tendinopathy of right biceps tendon 07/02/2016  . Thickened endometrium   . Uterine fibroid   . UTI (urinary tract infection) due to Enterococcus 01/05/2020  . Wears glasses    Past Surgical History:  Procedure Laterality Date  . BREAST REDUCTION SURGERY Bilateral 2000  . COLONOSCOPY    . CYST EXCISION  1985   head and rt axilla  . DILATATION & CURETTAGE/HYSTEROSCOPY WITH MYOSURE N/A 08/13/2018   Procedure: DILATATION &  CURETTAGE/HYSTEROSCOPY WITH MYOSURE;  Surgeon: Lavoie, Marie-Lyne, MD;  Location: [facility];  Service: Gynecology;  Laterality: N/A;  requests 10:15am in [place] Gyn block request 30 minutes  . DILATION AND CURETTAGE OF UTERUS N/A 08/13/2018  . ECTOPIC PREGNANCY SURGERY  1981  . OPEN SURGICAL REPAIR OF GLUTEAL TENDON Right 07/02/2023   Procedure: REPAIR, TENDON, GLUTEUS MEDIUS, OPEN;  Surgeon: Genelle Standing, MD;  Location: [facility];  Service: Orthopedics;  Laterality: Right;  RIGHT GLUTEUS MAXIMUS TENDON TRANSFER WITH COLLAGEN PATCH AUGMENTATION  . PARATHYROIDECTOMY Left 09/17/2008   dr jeoffrey dawn  @[facility]    left superior  (adenoma)  . REDUCTION MAMMAPLASTY Bilateral 2000   years ago  . SHOULDER SURGERY Right 2007   frozen  . UPPER GASTROINTESTINAL ENDOSCOPY  last one 12-18-2017   dr wilhelmenia   Social History   Socioeconomic History  . Marital status: Married    Spouse name: Not on file  . Number of children: 1  . Years of education: Not on file  . Highest education level: Not on file  Occupational History  . Not on file  Tobacco Use  . Smoking status: Former    Current packs/day: 0.00    Types: Cigarettes    Start date: 01/30/1976    Quit date:  01/30/1991    Years since quitting: 32.8  . Smokeless tobacco: Never  Vaping Use  . Vaping status: Never Used  Substance and Sexual Activity  . Alcohol use: Not Currently  . Drug use: Never  . Sexual activity: Not Currently    Birth control/protection: Post-menopausal    Comment: 1st intercourse- 34, partners- 10, married- 41 yrs   Other Topics Concern  . Not on file  Social History Narrative   Lives with husband. Feels safe at home. 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 [employer]  Owner. Some college. Former smoker. Wears seat belt.    Social Drivers of Health   Financial Resource Strain: Low Risk  (04/10/2023)   Overall Financial Resource Strain (CARDIA)   . Difficulty of Paying Living Expenses: Not  hard at all  Food Insecurity: No Food Insecurity (04/10/2023)   Hunger Vital Sign   . Worried About Programme researcher, broadcasting/film/video in the Last Year: Never true   . Ran Out of Food in the Last Year: Never true  Transportation Needs: No Transportation Needs (04/10/2023)   PRAPARE - Transportation   . Lack of Transportation (Medical): No   . Lack of Transportation (Non-Medical): No  Physical Activity: Inactive (04/10/2023)   Exercise Vital Sign   . Days of Exercise per Week: 0 days   . Minutes of Exercise per Session: 0 min  Stress: No Stress Concern Present (04/10/2023)   [employer] of Occupational Health - Occupational Stress Questionnaire   . Feeling of Stress : Not at all  Social Connections: Moderately Integrated (04/10/2023)   Social Connection and Isolation Panel   . Frequency of Communication with Friends and Family: More than three times a week   . Frequency of Social Gatherings with Friends and Family: More than three times a week   . Attends Religious Services: Never   . Active Member of Clubs or Organizations: Yes   . Attends Banker Meetings: 1 to 4 times per year   . Marital Status: Married   Family History  Problem Relation Age of Onset  . Breast cancer Mother 99  . Heart disease Father 34  . Hypertension Sister   . Breast cancer Maternal Aunt   . Breast cancer Cousin 60  . Hypertension Brother   . Colon cancer Neg Hx   . Esophageal cancer Neg Hx   . Stomach cancer Neg Hx   . Rectal cancer Neg Hx   . Colon polyps Neg Hx    Allergies  Allergen Reactions  . Latex Itching and Rash  . Polysporin [Bacitracin-Polymyxin B] Rash   Current Outpatient Medications  Medication Sig Dispense Refill  . aspirin  EC 325 MG tablet Take 1 tablet (325 mg total) by mouth daily. 14 tablet 0  . atorvastatin  (LIPITOR) 20 MG tablet Take 1 tablet (20 mg total) by mouth daily. 90 tablet 3  . Calcium  Carbonate-Vit D-Min (CALCIUM  1200 PO) Take by mouth.    . Cholecalciferol  (VITAMIN D3) 10 MCG (400 UNIT) CAPS SMARTSIG:1 Capsule(s) By Mouth    . FLUoxetine  (PROZAC ) 40 MG capsule Take 1 capsule (40 mg total) by mouth daily. 90 capsule 3  . folic acid  (FOLVITE ) 1 MG tablet Take 1 tablet (1 mg total) by mouth daily. 90 tablet 3  . omeprazole  (PRILOSEC) 40 MG capsule Take 1 capsule (40 mg total) by mouth daily. 90 capsule 3  . oxyCODONE  (ROXICODONE ) 5 MG immediate release tablet Take 1 tablet (5 mg total) by mouth every 4 (four) hours as  needed for severe pain (pain score 7-10) or breakthrough pain. 15 tablet 0  . telmisartan  (MICARDIS ) 80 MG tablet Take 1 tablet (80 mg total) by mouth daily. 90 tablet 1  . valACYclovir  (VALTREX ) 500 MG tablet Take 500 mg by mouth as needed.      No current facility-administered medications for this visit.   No results found.  Review of Systems:   A ROS was performed including pertinent positives and negatives as documented in the HPI.   Musculoskeletal Exam:    There were no vitals taken for this visit.  Right hip incision is well-appearing without erythema or drainage.  Distal neurosensory exam is intact.  Internal/external rotation of the right hip without pain.  There is Trendelenburg gait  Imaging:      I personally reviewed and interpreted the radiographs.   Assessment:   4.5 months status post right hip gluteus maximus tendon transfer.  Overall she has somewhat plateaued in terms of strength and regarding that I would like to get her back engaged with physical therapy both aquatic and land therapy.  Will plan for a right hip ultrasound-guided injection to help with some of her pain symptoms as well as she progresses  Plan :    - Return to clinic 12 weeks for reassessment    Procedure Note  Patient: Rebecca Long             Date of Birth: 07/03/1950           MRN: [id number]             Visit Date: 11/20/2023  Procedures: Visit Diagnoses:  1. Tear of right gluteus medius tendon, initial encounter      Large Joint Inj: R greater trochanter on 11/20/2023 12:54 PM Indications: pain Details: 22 G 3.5 in needle, ultrasound-guided anterolateral approach  Arthrogram: No  Medications: 4 mL lidocaine  1 %; 80 mg triamcinolone  acetonide 40 MG/ML Outcome: tolerated well, no immediate complications Procedure, treatment alternatives, risks and benefits explained, specific risks discussed. Consent was given by the patient. Immediately prior to procedure a time out was called to verify the correct patient, procedure, equipment, support staff and site/side marked as required. Patient was prepped and draped in the usual sterile fashion.            I personally saw and evaluated the patient, and participated in the management and treatment plan.  Elspeth Parker, MD Attending Physician, Orthopedic Surgery  This document was dictated using Dragon voice recognition software. A reasonable attempt at proof reading has been made to minimize errors.

## 2023-11-21 ENCOUNTER — Ambulatory Visit (HOSPITAL_BASED_OUTPATIENT_CLINIC_OR_DEPARTMENT_OTHER): Attending: Orthopaedic Surgery | Admitting: Physical Therapy

## 2023-11-21 ENCOUNTER — Encounter (HOSPITAL_BASED_OUTPATIENT_CLINIC_OR_DEPARTMENT_OTHER): Payer: Self-pay | Admitting: Physical Therapy

## 2023-11-21 DIAGNOSIS — R262 Difficulty in walking, not elsewhere classified: Secondary | ICD-10-CM | POA: Insufficient documentation

## 2023-11-21 DIAGNOSIS — R252 Cramp and spasm: Secondary | ICD-10-CM | POA: Insufficient documentation

## 2023-11-21 DIAGNOSIS — M25551 Pain in right hip: Secondary | ICD-10-CM | POA: Diagnosis not present

## 2023-11-21 DIAGNOSIS — M6281 Muscle weakness (generalized): Secondary | ICD-10-CM | POA: Insufficient documentation

## 2023-11-21 DIAGNOSIS — S76011A Strain of muscle, fascia and tendon of right hip, initial encounter: Secondary | ICD-10-CM | POA: Diagnosis not present

## 2023-11-21 NOTE — Therapy (Signed)
 OUTPATIENT PHYSICAL THERAPY TREATMENT         Re-cert  Patient Name: Rebecca Long MRN: [id number] DOB:[birth year], 73 y.o., female Today's Date: 11/21/2023      END OF SESSION:  PT End of Session - 11/21/23 9077     Visit Number 29    Number of Visits 45    Date for Recertification  01/17/24    Authorization Type HTA no auth required    Progress Note Due on Visit 30    PT Start Time 0930    PT Stop Time 1002    PT Time Calculation (min) 32 min    Activity Tolerance Patient tolerated treatment well    Behavior During Therapy WFL for tasks assessed/performed                 Past Medical History:  Diagnosis Date   Anxiety    Cavus foot, acquired 09/09/2012    bilateral cavus feet the one on the left shows that she has some probable neurogenic weakness with abnormal curvature and in inversion contracture Podiatry 06/2015: H&P and x-ray reviewed with patient. Today I went ahead and I did a proximal nerve block I was able to aspirate the second MPJ and got out amount of clear fluid and injected with a quarter cc dexamethasone  Kenalog  and ap   DDD (degenerative disc disease), lumbar    Depression    Endometrial polyp    GERD (gastroesophageal reflux disease)    Hiatal hernia    Hip flexor tendinitis, right 07/02/2016   History of adenomatous polyp of colon    History of esophageal stricture    History of gastric polyp 11/2017   History of PCR DNA positive for HSV2 11/12/2016   History of primary hyperparathyroidism    s/p  left parathryoidectomy 09-17-2008-- resolved   Hyperlipidemia    Hypertension    Insomnia    Lumbar stenosis    Osteopenia    PONV (postoperative nausea and vomiting)    Schatzki's ring    Scoliosis    Tendinopathy of right biceps tendon 07/02/2016   Thickened endometrium    Uterine fibroid    UTI (urinary tract infection) due to Enterococcus 01/05/2020   Wears glasses    Past Surgical History:  Procedure Laterality Date   BREAST REDUCTION  SURGERY Bilateral 2000   COLONOSCOPY     CYST EXCISION  1985   head and rt axilla   DILATATION & CURETTAGE/HYSTEROSCOPY WITH MYOSURE N/A 08/13/2018   Procedure: DILATATION & CURETTAGE/HYSTEROSCOPY WITH MYOSURE;  Surgeon: Lavoie, Marie-Lyne, MD;  Location: [facility];  Service: Gynecology;  Laterality: N/A;  requests 10:15am in [place] Gyn block request 30 minutes   DILATION AND CURETTAGE OF UTERUS N/A 08/13/2018   ECTOPIC PREGNANCY SURGERY  1981   OPEN SURGICAL REPAIR OF GLUTEAL TENDON Right 07/02/2023   Procedure: REPAIR, TENDON, GLUTEUS MEDIUS, OPEN;  Surgeon: Genelle Standing, MD;  Location: [facility];  Service: Orthopedics;  Laterality: Right;  RIGHT GLUTEUS MAXIMUS TENDON TRANSFER WITH COLLAGEN PATCH AUGMENTATION   PARATHYROIDECTOMY Left 09/17/2008   dr jeoffrey dawn  @[facility]    left superior  (adenoma)   REDUCTION MAMMAPLASTY Bilateral 2000   years ago   SHOULDER SURGERY Right 2007   frozen   UPPER GASTROINTESTINAL ENDOSCOPY  last one 12-18-2017   dr wilhelmenia   Patient Active Problem List   Diagnosis Date Noted   Tendinopathy of gluteus medius 07/02/2023   Greater trochanteric bursitis of right hip 04/17/2022  C. difficile diarrhea 03/08/2022   Arthritis of right sacroiliac joint 02/26/2022   Encounter for monitoring long-term proton pump inhibitor therapy 07/05/2021   Hx of colonic polyps 06/06/2021   Incontinence of feces with fecal urgency 06/06/2021   Sleep disorder 11/29/2020   Benign paroxysmal positional vertigo of right ear 08/30/2020   AC (acromioclavicular) arthritis 10/22/2019   Adhesive bursitis of left shoulder 07/22/2019   GERD with esophagitis 12/30/2017   Hyperlipidemia 05/15/2017   Morbid obesity (HCC) 11/12/2016   Essential hypertension, benign 10/06/2014   Depression with anxiety 10/06/2014   Stress incontinence 10/06/2014   Osteoporosis 10/06/2014   Degenerative disc disease, lumbar 09/09/2012   Spinal stenosis of lumbar  region 09/09/2012    PCP: Charlies Bellini, DO  REFERRING PROVIDER: Elspeth Parker, MD  REFERRING DIAG: post-op REPAIR, TENDON, GLUTEUS MEDIUS, OPEN (Right) - RIGHT GLUTEUS MAXIMUS TENDON TRANSFER WITH COLLAGEN PATCH AUGMENTATION  Rationale for Evaluation and Treatment: Rehabilitation  THERAPY DIAG:  Muscle weakness (generalized)  Difficulty in walking, not elsewhere classified  Pain in right hip  ONSET DATE: 07/02/23 surgery date - chronic pain and weakness prior to surgery  SUBJECTIVE:                                                                                                                                                                                           SUBJECTIVE STATEMENT: Dr Parker told me to try not to use my cane.  Wanted me to try aquatic therapy.I leave for [place]  for 3-4 months on Dec 23.  Pain is low today 1/10.  Think it is better than before surgery.  PAIN: 10/31/23  Are you having pain? Yes NPRS scale: 2/10 soreness Pain location: Rt lateral hip Pain orientation: Right  PAIN TYPE: aching Pain description: intermittent, dull, and aching  Aggravating factors: laying on Rt side, in/out of car, walking Relieving factors: sitting   PRECAUTIONS: Other: protocol from Dr. Parker scanned into media section of chart - avoid until 6 weeks post-op which is 08/13/23: passive ADDuction and ER, active ABDuction and IR  RED FLAGS: None   WEIGHT BEARING RESTRICTIONS: PWB initially, doctor just cleared to get rid of walking stick when ready  FALLS:  Has patient fallen in last 6 months? Yes. Number of falls 1, while carrying a planter and going up stairs  LIVING ENVIRONMENT: Lives with: lives with their spouse Lives in: House/apartment Stairs: Yes: External: 3 steps; on left going up Has following equipment at home: Single point cane  OCCUPATION: retired  PLOF: Independent  PATIENT GOALS: walk without pain and walking stick, can I get back to  pickleball?  NEXT MD  VISIT: 3 weeks  OBJECTIVE:  Note: Objective measures were completed at Evaluation unless otherwise noted.  DIAGNOSTIC FINDINGS:  Xray (3 views right hip): No femoral acetabular osteoarthritis   MRI (right hip): There is an end-stage full-thickness tear of the gluteus medius with significant atrophy on T1 coronal view  PATIENT SURVEYS:  LEFS: 44/80 7/16: LEFS  42/80 9/4: 32/80 10/23: 27/80  COGNITION: Overall cognitive status: Within functional limits for tasks assessed     SENSATION: WFL  MUSCLE LENGTH:   POSTURE: flexed trunk   PALPATION: Mild tenderness around incision - steri strips placed today after stitches were removed  LUMBAR ROM:  WFL  LOWER EXTREMITY ROM:    Pt able to perform seated march A/ROM and quadruped rocking for hip flexion beyond 90 deg without pain  11/21/23 Right hip IR/ER full  LOWER EXTREMITY MMT:   Lt LE 4+/5 Rt hip at least 3+/5 - isometric testing only, resisted isometrics strong and painfree  11/21/23 Muscle testing using HD (LBS) R hip flex 38.6  abd 20.6 L hip flex 38.6  abd 31.6  MMT right hip extension 3+  Right hip abd 3 to 3+  FUNCTIONAL TESTS:  Eval: 5 times sit to stand: 13.23 Timed up and go (TUG): 12.24 using walking stick 2 MWT: 315 feet using single walking stick in Lt UE  08/01/2023: 6 minute walk test:  759 ft with [facility] 08/07/23: 6 minute walk test: 781' with [facility] 7/15: 5x STS with hands: 10.40 5x STS no hands 11.19 TUG: with cane: 9.29 sec  09/05/23: 6 MWT 1010 feet with SPC 5x STS no hands 9.94  11/21/23 5x STS from bench: 15.04 GAIT: Distance walked: 315' in 2 min  Assistive device utilized: single walking stick Level of assistance: Modified independence Comments: Rt early stance phase Trendelenburg  TREATMENT DATE: [facility] Adult PT Treatment:                                                DATE: 11/21/23 Re-assessment testing: balance, 5 x STS, strength      10/31/23 bike x  6 minutes- PT present to discuss progress  Supine blue band hip extension 20x  Prone glute squeeze 10x Prone over 1 pillow hip extension 10x right/left Side planks 10x right only Supine propped on wedge: hip thruster with pair of 5# weights on hips 2 x10 Sit to stand pair of 5# dumbbells Lateral lean to the left on the back of the UBE seat: left hip flexion (partially unloading right LE) Cable walk 10x backwards only with belt: 10x Hip machine 30# right only: hip abduction and extension 2 x10   10/29/23 Nu-Step  level 5 x 6 minutes- PT present to discuss progress  6 inch step ups  right leading 10x 2 hand support, lateral step up x10 on Rt  WB on right with step taps 10x using 8 steps Single leg stance on Rt with lunge taps to colored discs on floor-forward and lateral bil  Resisted walking -Side stepping: 10# x8 each with close CGA Leg press seat 6 85# bil 2x10; 45# single leg 2x10 right/left At the stairs 2nd step hip flexor, gastroc and quadratus lumborum muscle lengthening 3 sets of 5 right/left: Rocking forward and back with arm reach up and over Sidestepping lateral over hurdles x4 laps  10/24/23  Nu-Step  level 5 x 6  minutes- PT present to discuss progress  6 inch step ups  right leading 10x 2 hand support WB on right with step taps 10x  Side stepping with pink power cord 8x right/left Side wall plank left: with left hip flexion 2x10 Side wall plank to the left with left hip abduction 2x10 At the stairs 2nd step hip flexor, gastroc and quadratus lumborum muscle lengthening 3 sets of 5 right/left: Rocking forward and back with arm reach up and over 10/22/23 Bike: level 2x 6 minutes- PT present to discuss progress  Side step cable 5 # with belt 7x right/left At the stairs 2nd step hip flexor, gastroc and quadratus lumborum muscle lengthening 3 sets of 5 right/left: Rocking forward and back with arm reach up and over 2 rounds 1 min rest break b/w rounds:   10 lateral sliders each  side right/left 6 clockwise and counterclock slider circles right/left 10 single leg march with 5# weight in same side hand right/left   Side wall plank:with left hip flexion 10x needs UE support  Bridge with feet on 8 inch step 10x Supine with heel taps for core activation 2x10 Side bridge 8x right/left  PATIENT EDUCATION:  Education details: [email or web address] Person educated: Patient Education method: Explanation, Demonstration, Verbal cues, and Handouts Education comprehension: verbalized understanding and returned demonstration  HOME EXERCISE PROGRAM: Access Code: [email or web address] URL: [email or web address] Date: 07/12/2023 Prepared by: Orvil Beuhring  Exercises - Supine Posterior Pelvic Tilt  - 2 x daily - 7 x weekly - 2 sets - 10 reps - Supine Short Arc Quad  - 2 x daily - 7 x weekly - 2 sets - 10 reps - Supine Hip Adduction Isometric with Ball  - 2 x daily - 7 x weekly - 2 sets - 10 reps - 5 hold - Quadruped Rocking Backward  - 2 x daily - 7 x weekly - 1-2 sets - 10 reps - 5 hold - Seated Isometric Hip Abduction  - 2 x daily - 7 x weekly - 2 sets - 10 reps - 5 hold - Standing Knee Flexion AROM with Chair Support  - 2 x daily - 7 x weekly - 2 sets - 10 reps  ASSESSMENT:  CLINICAL IMPRESSION: Re-cert: Pt had injection by Dr Genelle 11/20/23 and presents at aquatic therapy with additional orders for aquatic PT intervention to begin. Pt presents without SPC. Pt has had at least a 3 week hiatus from therapy. Testing completed.  Results variable.  LEFS has declined steadily since evaluation with her lowest score today.  Likely do to her long duration of care and more exaggerated perception of her lack of ability. Strength testing partly completed with hand dynamometer.  She has obvious deficits in right hip abduction. Gait is with a marked trendelenburg. Pt is encouraged to use SPC when amb to aquatics due to distance and gross limp to improve gait and reduce LBP which she reports is  chronic.  She reports she will be leaving for [place]  dec 23 for the remainder of the winter. Plan to progress aquatics for the next few week then will assess returning to land. She is a good candidate for aquatic intervention and will benefit from the properties of water to progress towards functional goals.      OBJECTIVE IMPAIRMENTS: Abnormal gait, decreased balance, decreased coordination, difficulty walking, decreased strength, increased edema, impaired flexibility, improper body mechanics, postural dysfunction, and pain.   ACTIVITY LIMITATIONS: lifting, bending, squatting, stairs, and locomotion level  PARTICIPATION LIMITATIONS: cleaning, laundry, shopping,  community activity, and yard work  PERSONAL FACTORS: Age are also affecting patient's functional outcome.   REHAB POTENTIAL: Excellent  CLINICAL DECISION MAKING: Evolving/moderate complexity  EVALUATION COMPLEXITY: Moderate   GOALS: Goals reviewed with patient? Yes  SHORT TERM GOALS: Target date: 08/09/23  Pt will be ind with initial HEP Baseline: Goal status: Met on 08/01/23  2.  Pt will be educated on movements to avoid to follow post-op protocol to protect tendon transfer site Baseline:  Goal status: MET  3.  Pt will demo improved Rt hip control in stance phase of gait with single walking stick as needed. Baseline:  Goal status: MET 08/07/23    LONG TERM GOALS: Target date: 01/17/2024   Pt will be ind with advanced HEP Baseline:  Goal status: ongoing  2.  Pt will improve LEFS score to at least 55/80 to demo improved function Baseline: 44/80 Goal status: ongoing  3.  Pt will perform 6 min walk test covering at least 700' with walking stick as needed Baseline:  Goal status: MET 08/07/23 with walking stick  4.  Pt will be able to demo squat, lift 10lb, carry 10lb and walk to mimic household chores and yard activities with proper body mechanics without pain. Baseline:  Goal status: ongoing  5.  Pt will  improve glut strength to at least 4+/5 to improve gait mechanics, stairs and transfers. Baseline:  Goal status: Progressing   6.  5x STS improved to 11 sec or less to demo improved functional strength and reduce fall risk. Baseline: 13.23 Goal status: ongoing    7. Pt will tolerate walking to and from setting and engaging in aquatic therapy session without excessive fatigue or increase in pain to demonstrate improved toleration to activity.  Baseline:  Goal Status: NEW   8. Pt will tolerate stair climbing using alternating pattern ascending and descending 6 steps with or without use of handrail   Baseline:  Goal Status: NEW   9. Pt will improve strength in right hip abd by at least 5lbs (using HD) to reduce trendelenburg gait pattern and improve function  Baseline: marked pattern  Goal Status: NEW  PLAN:  PT FREQUENCY: 2x/week  PT DURATION: 8 weeks  PLANNED INTERVENTIONS: 97110-Therapeutic exercises, 97530- Therapeutic activity, [id number]- Neuromuscular re-education, 97535- Self Care, [id number]- Manual therapy, [phone]- Gait training, Patient/Family education, Balance training, Stair training, Cryotherapy, and Moist heat.  PLAN FOR NEXT SESSION: will put on hold while traveling to [place]  and then will see the doctor in mid Oct. Pt will call with status update for ERO vs. discharge  hip machine; leg press; unilateral cable column rotations, step downs, side stepping with band;  gluteal muscle activation; hip mobilizations; Continue to work on gait endurance, Hip stability exercises, weight bearing  Ronal Foots) Alliah Boulanger MPT 11/21/23 10:32 AM [facility] [address] [place], [place], [address] Phone: [phone]   Fax:  [phone]

## 2023-11-25 ENCOUNTER — Encounter (HOSPITAL_BASED_OUTPATIENT_CLINIC_OR_DEPARTMENT_OTHER): Payer: Self-pay | Admitting: Physical Therapy

## 2023-11-25 ENCOUNTER — Ambulatory Visit (HOSPITAL_BASED_OUTPATIENT_CLINIC_OR_DEPARTMENT_OTHER): Admitting: Physical Therapy

## 2023-11-25 DIAGNOSIS — R262 Difficulty in walking, not elsewhere classified: Secondary | ICD-10-CM

## 2023-11-25 DIAGNOSIS — M6281 Muscle weakness (generalized): Secondary | ICD-10-CM | POA: Diagnosis not present

## 2023-11-25 DIAGNOSIS — M25551 Pain in right hip: Secondary | ICD-10-CM

## 2023-11-25 NOTE — Therapy (Signed)
 OUTPATIENT PHYSICAL THERAPY TREATMENT           Patient Name: Rebecca Long MRN: [id number] DOB:[birth year], 73 y.o., female Today's Date: 11/25/2023   END OF SESSION:  PT End of Session - 11/25/23 0934     Visit Number 30    Number of Visits 45    Date for Recertification  01/17/24    Authorization Type HTA no auth required    Progress Note Due on Visit 39    PT Start Time 0930    PT Stop Time 1010    PT Time Calculation (min) 40 min    Activity Tolerance Patient tolerated treatment well    Behavior During Therapy [facility] for tasks assessed/performed                 Past Medical History:  Diagnosis Date   Anxiety    Cavus foot, acquired 09/09/2012    bilateral cavus feet the one on the left shows that she has some probable neurogenic weakness with abnormal curvature and in inversion contracture Podiatry 06/2015: H&P and x-ray reviewed with patient. Today I went ahead and I did a proximal nerve block I was able to aspirate the second MPJ and got out amount of clear fluid and injected with a quarter cc dexamethasone  Kenalog  and ap   DDD (degenerative disc disease), lumbar    Depression    Endometrial polyp    GERD (gastroesophageal reflux disease)    Hiatal hernia    Hip flexor tendinitis, right 07/02/2016   History of adenomatous polyp of colon    History of esophageal stricture    History of gastric polyp 11/2017   History of PCR DNA positive for HSV2 11/12/2016   History of primary hyperparathyroidism    s/p  left parathryoidectomy 09-17-2008-- resolved   Hyperlipidemia    Hypertension    Insomnia    Lumbar stenosis    Osteopenia    PONV (postoperative nausea and vomiting)    Schatzki's ring    Scoliosis    Tendinopathy of right biceps tendon 07/02/2016   Thickened endometrium    Uterine fibroid    UTI (urinary tract infection) due to Enterococcus 01/05/2020   Wears glasses    Past Surgical History:  Procedure Laterality Date   BREAST REDUCTION SURGERY  Bilateral 2000   COLONOSCOPY     CYST EXCISION  1985   head and rt axilla   DILATATION & CURETTAGE/HYSTEROSCOPY WITH MYOSURE N/A 08/13/2018   Procedure: DILATATION & CURETTAGE/HYSTEROSCOPY WITH MYOSURE;  Surgeon: Lavoie, Marie-Lyne, MD;  Location: [facility];  Service: Gynecology;  Laterality: N/A;  requests 10:15am in [place] Gyn block request 30 minutes   DILATION AND CURETTAGE OF UTERUS N/A 08/13/2018   ECTOPIC PREGNANCY SURGERY  1981   OPEN SURGICAL REPAIR OF GLUTEAL TENDON Right 07/02/2023   Procedure: REPAIR, TENDON, GLUTEUS MEDIUS, OPEN;  Surgeon: Genelle Standing, MD;  Location: [facility];  Service: Orthopedics;  Laterality: Right;  RIGHT GLUTEUS MAXIMUS TENDON TRANSFER WITH COLLAGEN PATCH AUGMENTATION   PARATHYROIDECTOMY Left 09/17/2008   dr jeoffrey dawn  @[facility]    left superior  (adenoma)   REDUCTION MAMMAPLASTY Bilateral 2000   years ago   SHOULDER SURGERY Right 2007   frozen   UPPER GASTROINTESTINAL ENDOSCOPY  last one 12-18-2017   dr wilhelmenia   Patient Active Problem List   Diagnosis Date Noted   Tendinopathy of gluteus medius 07/02/2023   Greater trochanteric bursitis of right hip 04/17/2022   C. difficile  diarrhea 03/08/2022   Arthritis of right sacroiliac joint 02/26/2022   Encounter for monitoring long-term proton pump inhibitor therapy 07/05/2021   Hx of colonic polyps 06/06/2021   Incontinence of feces with fecal urgency 06/06/2021   Sleep disorder 11/29/2020   Benign paroxysmal positional vertigo of right ear 08/30/2020   AC (acromioclavicular) arthritis 10/22/2019   Adhesive bursitis of left shoulder 07/22/2019   GERD with esophagitis 12/30/2017   Hyperlipidemia 05/15/2017   Morbid obesity (HCC) 11/12/2016   Essential hypertension, benign 10/06/2014   Depression with anxiety 10/06/2014   Stress incontinence 10/06/2014   Osteoporosis 10/06/2014   Degenerative disc disease, lumbar 09/09/2012   Spinal stenosis of lumbar region  09/09/2012    PCP: Charlies Bellini, DO  REFERRING PROVIDER: Elspeth Parker, MD  REFERRING DIAG: post-op REPAIR, TENDON, GLUTEUS MEDIUS, OPEN (Right) - RIGHT GLUTEUS MAXIMUS TENDON TRANSFER WITH COLLAGEN PATCH AUGMENTATION  Rationale for Evaluation and Treatment: Rehabilitation  THERAPY DIAG:  Muscle weakness (generalized)  Difficulty in walking, not elsewhere classified  Pain in right hip  ONSET DATE: 07/02/23 surgery date - chronic pain and weakness prior to surgery  SUBJECTIVE:                                                                                                                                                                                           SUBJECTIVE STATEMENT: I feel about the same.   I'm tired of limping.    PAIN: 10/31/23  Are you having pain? Yes NPRS scale: 1/10 soreness Pain location: Rt lateral hip Pain orientation: Right  PAIN TYPE: aching Pain description: intermittent, dull, and aching  Aggravating factors: laying on Rt side, in/out of car, walking Relieving factors: sitting   PRECAUTIONS: Other: protocol from Dr. Parker scanned into media section of chart - avoid until 6 weeks post-op which is 08/13/23: passive ADDuction and ER, active ABDuction and IR  RED FLAGS: None   WEIGHT BEARING RESTRICTIONS: PWB initially, doctor just cleared to get rid of walking stick when ready  FALLS:  Has patient fallen in last 6 months? Yes. Number of falls 1, while carrying a planter and going up stairs  LIVING ENVIRONMENT: Lives with: lives with their spouse Lives in: House/apartment Stairs: Yes: External: 3 steps; on left going up Has following equipment at home: Single point cane  OCCUPATION: retired  PLOF: Independent  PATIENT GOALS: walk without pain and walking stick, can I get back to pickleball?  NEXT MD VISIT: 3 weeks  OBJECTIVE:  Note: Objective measures were completed at Evaluation unless otherwise noted.  DIAGNOSTIC FINDINGS:   Xray (3 views right hip): No femoral acetabular osteoarthritis  MRI (right hip): There is an end-stage full-thickness tear of the gluteus medius with significant atrophy on T1 coronal view  PATIENT SURVEYS:  LEFS: 44/80 7/16: LEFS  42/80 9/4: 32/80 10/23: 27/80  COGNITION: Overall cognitive status: Within functional limits for tasks assessed     SENSATION: WFL  MUSCLE LENGTH:   POSTURE: flexed trunk   PALPATION: Mild tenderness around incision - steri strips placed today after stitches were removed  LUMBAR ROM:  WFL  LOWER EXTREMITY ROM:    Pt able to perform seated march A/ROM and quadruped rocking for hip flexion beyond 90 deg without pain  11/21/23 Right hip IR/ER full  LOWER EXTREMITY MMT:   Lt LE 4+/5 Rt hip at least 3+/5 - isometric testing only, resisted isometrics strong and painfree  11/21/23 Muscle testing using HD (LBS) R hip flex 38.6  abd 20.6 L hip flex 38.6  abd 31.6  MMT right hip extension 3+  Right hip abd 3 to 3+  FUNCTIONAL TESTS:  Eval: 5 times sit to stand: 13.23 Timed up and go (TUG): 12.24 using walking stick 2 MWT: 315 feet using single walking stick in Lt UE  08/01/2023: 6 minute walk test:  759 ft with [facility] 08/07/23: 6 minute walk test: 781' with [facility] 7/15: 5x STS with hands: 10.40 5x STS no hands 11.19 TUG: with cane: 9.29 sec  09/05/23: 6 MWT 1010 feet with SPC 5x STS no hands 9.94  11/21/23 5x STS from bench: 15.04 GAIT: Distance walked: 315' in 2 min  Assistive device utilized: single walking stick Level of assistance: Modified independence Comments: Rt early stance phase Trendelenburg  TREATMENT DATE: [facility] Adult PT Treatment:                                             Date: 11/25/23 Pt seen for aquatic therapy today.  Treatment took place in water 3.5-4.75 ft in depth at the [facility] pool. Temp of water was 91.  Pt entered/exited the pool via stairs in step-to, step-through pattern independently with  bil rail.  - Intro to aquatic therapy principles - unsupported walking forward/ backward, cues for vertical trunk - unsupported side stepping -> with arm add/abdct with rainbow hand floats - suitcase carry with bil/single rainbow hand float by side, marching forward/backward - UE on wall:  toe/heel raises x10; hip add/abdct x10 ; hip flexion /extension x10; relaxed squats x 3 - marching forward/backward - UE on rainbow hand floats: SLS x 20sec each; 3 way LE kick 2x5 in R SLS, 1x5 L SLS, cues for  - TrA set with long hollow noodle pull down to thighs 2x 5 in standard stance and staggered stance  Pt requires the buoyancy and hydrostatic pressure of water for support, and to offload joints by unweighting joint load by at least 50 % in navel deep water and by at least 75-80% in chest to neck deep water.  Viscosity of the water is needed for resistance of strengthening. Water current perturbations provides challenge to standing balance requiring increased core activation.     [name] Adult PT Treatment:                                                DATE: 11/21/23  Re-assessment testing: balance, 5 x STS, strength   10/31/23 bike x 6 minutes- PT present to discuss progress  Supine blue band hip extension 20x  Prone glute squeeze 10x Prone over 1 pillow hip extension 10x right/left Side planks 10x right only Supine propped on wedge: hip thruster with pair of 5# weights on hips 2 x10 Sit to stand pair of 5# dumbbells Lateral lean to the left on the back of the UBE seat: left hip flexion (partially unloading right LE) Cable walk 10x backwards only with belt: 10x Hip machine 30# right only: hip abduction and extension 2 x10   10/29/23 Nu-Step  level 5 x 6 minutes- PT present to discuss progress  6 inch step ups  right leading 10x 2 hand support, lateral step up x10 on Rt  WB on right with step taps 10x using 8 steps Single leg stance on Rt with lunge taps to colored discs on floor-forward and  lateral bil  Resisted walking -Side stepping: 10# x8 each with close CGA Leg press seat 6 85# bil 2x10; 45# single leg 2x10 right/left At the stairs 2nd step hip flexor, gastroc and quadratus lumborum muscle lengthening 3 sets of 5 right/left: Rocking forward and back with arm reach up and over Sidestepping lateral over hurdles x4 laps  10/24/23  Nu-Step  level 5 x 6 minutes- PT present to discuss progress  6 inch step ups  right leading 10x 2 hand support WB on right with step taps 10x  Side stepping with pink power cord 8x right/left Side wall plank left: with left hip flexion 2x10 Side wall plank to the left with left hip abduction 2x10 At the stairs 2nd step hip flexor, gastroc and quadratus lumborum muscle lengthening 3 sets of 5 right/left: Rocking forward and back with arm reach up and over 10/22/23 Bike: level 2x 6 minutes- PT present to discuss progress  Side step cable 5 # with belt 7x right/left At the stairs 2nd step hip flexor, gastroc and quadratus lumborum muscle lengthening 3 sets of 5 right/left: Rocking forward and back with arm reach up and over 2 rounds 1 min rest break b/w rounds:   10 lateral sliders each side right/left 6 clockwise and counterclock slider circles right/left 10 single leg march with 5# weight in same side hand right/left   Side wall plank:with left hip flexion 10x needs UE support  Bridge with feet on 8 inch step 10x Supine with heel taps for core activation 2x10 Side bridge 8x right/left  PATIENT EDUCATION:  Education details: [email or web address] Person educated: Patient Education method: Explanation, Demonstration, Verbal cues, and Handouts Education comprehension: verbalized understanding and returned demonstration  HOME EXERCISE PROGRAM: Access Code: [email or web address] URL: [email or web address] Date: 07/12/2023 Prepared by: Orvil Beuhring  Exercises - Supine Posterior Pelvic Tilt  - 2 x daily - 7 x weekly - 2 sets - 10 reps - Supine Short  Arc Quad  - 2 x daily - 7 x weekly - 2 sets - 10 reps - Supine Hip Adduction Isometric with Ball  - 2 x daily - 7 x weekly - 2 sets - 10 reps - 5 hold - Quadruped Rocking Backward  - 2 x daily - 7 x weekly - 1-2 sets - 10 reps - 5 hold - Seated Isometric Hip Abduction  - 2 x daily - 7 x weekly - 2 sets - 10 reps - 5 hold - Standing Knee Flexion AROM with Chair Support  - 2 x daily - 7 x weekly -  2 sets - 10 reps  ASSESSMENT:  CLINICAL IMPRESSION: Pt demonstrates safety and independence in aquatic setting with therapist instructing from deck. Pt is confident in setting, moving throughout all depths easily.  Pt is directed through various movement patterns in standing positions.  Good toleration of aquatic exercises without increase in symptoms. Will continue to progress as tolerated.   Goals are ongoing. Plan to add stairs next aquatic visit.    Re-cert: Pt had injection by Dr Genelle 11/20/23 and presents at aquatic therapy with additional orders for aquatic PT intervention to begin. Pt presents without SPC. Pt has had at least a 3 week hiatus from therapy. Testing completed.  Results variable.  LEFS has declined steadily since evaluation with her lowest score today.  Likely do to her long duration of care and more exaggerated perception of her lack of ability. Strength testing partly completed with hand dynamometer.  She has obvious deficits in right hip abduction. Gait is with a marked trendelenburg. Pt is encouraged to use SPC when amb to aquatics due to distance and gross limp to improve gait and reduce LBP which she reports is chronic.  She reports she will be leaving for [place]  dec 23 for the remainder of the winter. Plan to progress aquatics for the next few week then will assess returning to land. She is a good candidate for aquatic intervention and will benefit from the properties of water to progress towards functional goals.      OBJECTIVE IMPAIRMENTS: Abnormal gait, decreased balance,  decreased coordination, difficulty walking, decreased strength, increased edema, impaired flexibility, improper body mechanics, postural dysfunction, and pain.   ACTIVITY LIMITATIONS: lifting, bending, squatting, stairs, and locomotion level  PARTICIPATION LIMITATIONS: cleaning, laundry, shopping, community activity, and yard work  PERSONAL FACTORS: Age are also affecting patient's functional outcome.   REHAB POTENTIAL: Excellent  CLINICAL DECISION MAKING: Evolving/moderate complexity  EVALUATION COMPLEXITY: Moderate   GOALS: Goals reviewed with patient? Yes  SHORT TERM GOALS: Target date: 08/09/23  Pt will be ind with initial HEP Baseline: Goal status: Met on 08/01/23  2.  Pt will be educated on movements to avoid to follow post-op protocol to protect tendon transfer site Baseline:  Goal status: MET  3.  Pt will demo improved Rt hip control in stance phase of gait with single walking stick as needed. Baseline:  Goal status: MET 08/07/23    LONG TERM GOALS: Target date: 01/17/2024   Pt will be ind with advanced HEP Baseline:  Goal status: ongoing  2.  Pt will improve LEFS score to at least 55/80 to demo improved function Baseline: 44/80 Goal status: ongoing  3.  Pt will perform 6 min walk test covering at least 700' with walking stick as needed Baseline:  Goal status: MET 08/07/23 with walking stick  4.  Pt will be able to demo squat, lift 10lb, carry 10lb and walk to mimic household chores and yard activities with proper body mechanics without pain. Baseline:  Goal status: ongoing  5.  Pt will improve glut strength to at least 4+/5 to improve gait mechanics, stairs and transfers. Baseline:  Goal status: Progressing   6.  5x STS improved to 11 sec or less to demo improved functional strength and reduce fall risk. Baseline: 13.23 Goal status: ongoing    7. Pt will tolerate walking to and from setting and engaging in aquatic therapy session without excessive  fatigue or increase in pain to demonstrate improved toleration to activity.  Baseline:  Goal Status: NEW  8. Pt will tolerate stair climbing using alternating pattern ascending and descending 6 steps with or without use of handrail   Baseline:  Goal Status: NEW   9. Pt will improve strength in right hip abd by at least 5lbs (using HD) to reduce trendelenburg gait pattern and improve function  Baseline: marked pattern  Goal Status: NEW  PLAN:  PT FREQUENCY: 2x/week  PT DURATION: 8 weeks  PLANNED INTERVENTIONS: 97110-Therapeutic exercises, 97530- Therapeutic activity, [id number]- Neuromuscular re-education, 97535- Self Care, [id number]- Manual therapy, [phone]- Gait training, Patient/Family education, Balance training, Stair training, Cryotherapy, and Moist heat.  PLAN FOR NEXT SESSION: assess response to  aquatic therapy  Delon Aquas, PTA 11/25/23 10:11 AM [facility] [address] [place], [place], [address] Phone: [phone]   Fax:  [phone]

## 2023-11-29 ENCOUNTER — Ambulatory Visit (HOSPITAL_BASED_OUTPATIENT_CLINIC_OR_DEPARTMENT_OTHER): Payer: Self-pay | Admitting: Physical Therapy

## 2023-11-29 ENCOUNTER — Encounter (HOSPITAL_BASED_OUTPATIENT_CLINIC_OR_DEPARTMENT_OTHER): Payer: Self-pay | Admitting: Physical Therapy

## 2023-11-29 DIAGNOSIS — R252 Cramp and spasm: Secondary | ICD-10-CM

## 2023-11-29 DIAGNOSIS — M6281 Muscle weakness (generalized): Secondary | ICD-10-CM

## 2023-11-29 DIAGNOSIS — M25551 Pain in right hip: Secondary | ICD-10-CM

## 2023-11-29 DIAGNOSIS — R262 Difficulty in walking, not elsewhere classified: Secondary | ICD-10-CM

## 2023-11-29 NOTE — Therapy (Signed)
 OUTPATIENT PHYSICAL THERAPY TREATMENT           Patient Name: Rebecca Long MRN: [id number] DOB:[birth year], 73 y.o., female Today's Date: 11/29/2023   END OF SESSION:  PT End of Session - 11/29/23 1106     Visit Number 31    Number of Visits 45    Date for Recertification  01/17/24    Authorization Type HTA no auth required    Progress Note Due on Visit 39    PT Start Time 1100    PT Stop Time 1139    PT Time Calculation (min) 39 min    Activity Tolerance Patient tolerated treatment well    Behavior During Therapy WFL for tasks assessed/performed          Past Medical History:  Diagnosis Date   Anxiety    Cavus foot, acquired 09/09/2012    bilateral cavus feet the one on the left shows that she has some probable neurogenic weakness with abnormal curvature and in inversion contracture Podiatry 06/2015: H&P and x-ray reviewed with patient. Today I went ahead and I did a proximal nerve block I was able to aspirate the second MPJ and got out amount of clear fluid and injected with a quarter cc dexamethasone  Kenalog  and ap   DDD (degenerative disc disease), lumbar    Depression    Endometrial polyp    GERD (gastroesophageal reflux disease)    Hiatal hernia    Hip flexor tendinitis, right 07/02/2016   History of adenomatous polyp of colon    History of esophageal stricture    History of gastric polyp 11/2017   History of PCR DNA positive for HSV2 11/12/2016   History of primary hyperparathyroidism    s/p  left parathryoidectomy 09-17-2008-- resolved   Hyperlipidemia    Hypertension    Insomnia    Lumbar stenosis    Osteopenia    PONV (postoperative nausea and vomiting)    Schatzki's ring    Scoliosis    Tendinopathy of right biceps tendon 07/02/2016   Thickened endometrium    Uterine fibroid    UTI (urinary tract infection) due to Enterococcus 01/05/2020   Wears glasses    Past Surgical History:  Procedure Laterality Date   BREAST REDUCTION SURGERY Bilateral 2000    COLONOSCOPY     CYST EXCISION  1985   head and rt axilla   DILATATION & CURETTAGE/HYSTEROSCOPY WITH MYOSURE N/A 08/13/2018   Procedure: DILATATION & CURETTAGE/HYSTEROSCOPY WITH MYOSURE;  Surgeon: Lavoie, Marie-Lyne, MD;  Location: [facility];  Service: Gynecology;  Laterality: N/A;  requests 10:15am in [place] Gyn block request 30 minutes   DILATION AND CURETTAGE OF UTERUS N/A 08/13/2018   ECTOPIC PREGNANCY SURGERY  1981   OPEN SURGICAL REPAIR OF GLUTEAL TENDON Right 07/02/2023   Procedure: REPAIR, TENDON, GLUTEUS MEDIUS, OPEN;  Surgeon: Genelle Standing, MD;  Location: [facility];  Service: Orthopedics;  Laterality: Right;  RIGHT GLUTEUS MAXIMUS TENDON TRANSFER WITH COLLAGEN PATCH AUGMENTATION   PARATHYROIDECTOMY Left 09/17/2008   dr jeoffrey dawn  @[facility]    left superior  (adenoma)   REDUCTION MAMMAPLASTY Bilateral 2000   years ago   SHOULDER SURGERY Right 2007   frozen   UPPER GASTROINTESTINAL ENDOSCOPY  last one 12-18-2017   dr wilhelmenia   Patient Active Problem List   Diagnosis Date Noted   Tendinopathy of gluteus medius 07/02/2023   Greater trochanteric bursitis of right hip 04/17/2022   C. difficile diarrhea 03/08/2022   Arthritis of right  sacroiliac joint 02/26/2022   Encounter for monitoring long-term proton pump inhibitor therapy 07/05/2021   Hx of colonic polyps 06/06/2021   Incontinence of feces with fecal urgency 06/06/2021   Sleep disorder 11/29/2020   Benign paroxysmal positional vertigo of right ear 08/30/2020   AC (acromioclavicular) arthritis 10/22/2019   Adhesive bursitis of left shoulder 07/22/2019   GERD with esophagitis 12/30/2017   Hyperlipidemia 05/15/2017   Morbid obesity (HCC) 11/12/2016   Essential hypertension, benign 10/06/2014   Depression with anxiety 10/06/2014   Stress incontinence 10/06/2014   Osteoporosis 10/06/2014   Degenerative disc disease, lumbar 09/09/2012   Spinal stenosis of lumbar region 09/09/2012     PCP: Charlies Bellini, DO  REFERRING PROVIDER: Elspeth Parker, MD  REFERRING DIAG: post-op REPAIR, TENDON, GLUTEUS MEDIUS, OPEN (Right) - RIGHT GLUTEUS MAXIMUS TENDON TRANSFER WITH COLLAGEN PATCH AUGMENTATION  Rationale for Evaluation and Treatment: Rehabilitation  THERAPY DIAG:  Muscle weakness (generalized)  Difficulty in walking, not elsewhere classified  Pain in right hip  Cramp and spasm  ONSET DATE: 07/02/23 surgery date - chronic pain and weakness prior to surgery  SUBJECTIVE:                                                                                                                                                                                           SUBJECTIVE STATEMENT: I was surprised my hip wasn't sore, but my arms were sore after last visit.   PAIN: 10/31/23  Are you having pain? no NPRS scale: 0/10 soreness Pain location:  Pain orientation:  PAIN TYPE: aching Pain description:  Aggravating factors: laying on Rt side, in/out of car, walking Relieving factors: sitting   PRECAUTIONS: Other: protocol from Dr. Parker scanned into media section of chart - avoid until 6 weeks post-op which is 08/13/23: passive ADDuction and ER, active ABDuction and IR  RED FLAGS: None   WEIGHT BEARING RESTRICTIONS: PWB initially, doctor just cleared to get rid of walking stick when ready  FALLS:  Has patient fallen in last 6 months? Yes. Number of falls 1, while carrying a planter and going up stairs  LIVING ENVIRONMENT: Lives with: lives with their spouse Lives in: House/apartment Stairs: Yes: External: 3 steps; on left going up Has following equipment at home: Single point cane  OCCUPATION: retired  PLOF: Independent  PATIENT GOALS: walk without pain and walking stick, can I get back to pickleball?  NEXT MD VISIT: 3 weeks  OBJECTIVE:  Note: Objective measures were completed at Evaluation unless otherwise noted.  DIAGNOSTIC FINDINGS:  Xray (3 views  right hip): No femoral acetabular osteoarthritis   MRI (right hip): There is an  end-stage full-thickness tear of the gluteus medius with significant atrophy on T1 coronal view  PATIENT SURVEYS:  LEFS: 44/80 7/16: LEFS  42/80 9/4: 32/80 10/23: 27/80  COGNITION: Overall cognitive status: Within functional limits for tasks assessed     SENSATION: WFL  MUSCLE LENGTH:   POSTURE: flexed trunk   PALPATION: Mild tenderness around incision - steri strips placed today after stitches were removed  LUMBAR ROM:  WFL  LOWER EXTREMITY ROM:    Pt able to perform seated march A/ROM and quadruped rocking for hip flexion beyond 90 deg without pain  11/21/23 Right hip IR/ER full  LOWER EXTREMITY MMT:   Lt LE 4+/5 Rt hip at least 3+/5 - isometric testing only, resisted isometrics strong and painfree  11/21/23 Muscle testing using HD (LBS) R hip flex 38.6  abd 20.6 L hip flex 38.6  abd 31.6  MMT right hip extension 3+  Right hip abd 3 to 3+  FUNCTIONAL TESTS:  Eval: 5 times sit to stand: 13.23 Timed up and go (TUG): 12.24 using walking stick 2 MWT: 315 feet using single walking stick in Lt UE  08/01/2023: 6 minute walk test:  759 ft with [facility] 08/07/23: 6 minute walk test: 781' with [facility] 7/15: 5x STS with hands: 10.40 5x STS no hands 11.19 TUG: with cane: 9.29 sec  09/05/23: 6 MWT 1010 feet with SPC 5x STS no hands 9.94  11/21/23 5x STS from bench: 15.04 GAIT: Distance walked: 315' in 2 min  Assistive device utilized: single walking stick Level of assistance: Modified independence Comments: Rt early stance phase Trendelenburg  TREATMENT DATE: [facility] Adult PT Treatment:                                             Date: 11/29/23 Pt seen for aquatic therapy today.  Treatment took place in water 3.5-4.75 ft in depth at the [facility] pool. Temp of water was 91.  Pt entered/exited the pool via stairs in step-to, step-through pattern independently with bil rail.   -  unsupported walking forward/ backward, cues for vertical trunk - unsupported side stepping -> with arm add/abdct with rainbow hand floats - suitcase carry with bil/single rainbow hand float by side, marching forward/backward - UE on rainbow hand floats: toe/heel raises x10; 3 way LE kick 2x5 (2nd set quicker/smaller range) - UE on wall: Rt single leg heel raise x 10  - forward walking kicks  - side plank on bench in water with 5 starfish each side - plank on bench in water with hip ext x 5 each LE - noodle stomp with hollow noodle, 10 slow, 10 quick each LE, trial of LLE into hip abdct (challenge! Needs RUE on wall) - SLS with hollow noodle under water on opposite side - return to walking forward/backward with cues for even steps  [facility] Adult PT Treatment:                                             Date: 11/25/23 Pt seen for aquatic therapy today.  Treatment took place in water 3.5-4.75 ft in depth at the [facility] pool. Temp of water was 91.  Pt entered/exited the pool via stairs in step-to, step-through pattern independently with bil rail.  -  Intro to aquatic therapy principles - unsupported walking forward/ backward, cues for vertical trunk - unsupported side stepping -> with arm add/abdct with rainbow hand floats - suitcase carry with bil/single rainbow hand float by side, marching forward/backward - UE on wall:  toe/heel raises x10; hip add/abdct x10 ; hip flexion /extension x10; relaxed squats x 3 - marching forward/backward - UE on rainbow hand floats: SLS x 20sec each; 3 way LE kick 2x5 in R SLS, 1x5 L SLS, cues for  - TrA set with long hollow noodle pull down to thighs 2x 5 in standard stance and staggered stance  Pt requires the buoyancy and hydrostatic pressure of water for support, and to offload joints by unweighting joint load by at least 50 % in navel deep water and by at least 75-80% in chest to neck deep water.  Viscosity of the water is needed for resistance of  strengthening. Water current perturbations provides challenge to standing balance requiring increased core activation.     [facility] Adult PT Treatment:                                                DATE: 11/21/23 Re-assessment testing: balance, 5 x STS, strength   10/31/23 bike x 6 minutes- PT present to discuss progress  Supine blue band hip extension 20x  Prone glute squeeze 10x Prone over 1 pillow hip extension 10x right/left Side planks 10x right only Supine propped on wedge: hip thruster with pair of 5# weights on hips 2 x10 Sit to stand pair of 5# dumbbells Lateral lean to the left on the back of the UBE seat: left hip flexion (partially unloading right LE) Cable walk 10x backwards only with belt: 10x Hip machine 30# right only: hip abduction and extension 2 x10   10/29/23 Nu-Step  level 5 x 6 minutes- PT present to discuss progress  6 inch step ups  right leading 10x 2 hand support, lateral step up x10 on Rt  WB on right with step taps 10x using 8 steps Single leg stance on Rt with lunge taps to colored discs on floor-forward and lateral bil  Resisted walking -Side stepping: 10# x8 each with close CGA Leg press seat 6 85# bil 2x10; 45# single leg 2x10 right/left At the stairs 2nd step hip flexor, gastroc and quadratus lumborum muscle lengthening 3 sets of 5 right/left: Rocking forward and back with arm reach up and over Sidestepping lateral over hurdles x4 laps  10/24/23  Nu-Step  level 5 x 6 minutes- PT present to discuss progress  6 inch step ups  right leading 10x 2 hand support WB on right with step taps 10x  Side stepping with pink power cord 8x right/left Side wall plank left: with left hip flexion 2x10 Side wall plank to the left with left hip abduction 2x10 At the stairs 2nd step hip flexor, gastroc and quadratus lumborum muscle lengthening 3 sets of 5 right/left: Rocking forward and back with arm reach up and over 10/22/23 Bike: level 2x 6 minutes- PT present to  discuss progress  Side step cable 5 # with belt 7x right/left At the stairs 2nd step hip flexor, gastroc and quadratus lumborum muscle lengthening 3 sets of 5 right/left: Rocking forward and back with arm reach up and over 2 rounds 1 min rest break b/w rounds:   10 lateral sliders  each side right/left 6 clockwise and counterclock slider circles right/left 10 single leg march with 5# weight in same side hand right/left   Side wall plank:with left hip flexion 10x needs UE support  Bridge with feet on 8 inch step 10x Supine with heel taps for core activation 2x10 Side bridge 8x right/left  PATIENT EDUCATION:  Education details: [email or web address] Person educated: Patient Education method: Explanation, Demonstration, Verbal cues, and Handouts Education comprehension: verbalized understanding and returned demonstration  HOME EXERCISE PROGRAM: Access Code: [email or web address] URL: [email or web address] Date: 07/12/2023 Prepared by: Orvil Beuhring  Exercises - Supine Posterior Pelvic Tilt  - 2 x daily - 7 x weekly - 2 sets - 10 reps - Supine Short Arc Quad  - 2 x daily - 7 x weekly - 2 sets - 10 reps - Supine Hip Adduction Isometric with Ball  - 2 x daily - 7 x weekly - 2 sets - 10 reps - 5 hold - Quadruped Rocking Backward  - 2 x daily - 7 x weekly - 1-2 sets - 10 reps - 5 hold - Seated Isometric Hip Abduction  - 2 x daily - 7 x weekly - 2 sets - 10 reps - 5 hold - Standing Knee Flexion AROM with Chair Support  - 2 x daily - 7 x weekly - 2 sets - 10 reps  ASSESSMENT:  CLINICAL IMPRESSION: Pt reported good toleration of aquatic exercises without increase in symptoms. She demonstrates decreased balance in SLS when in shallow water.  Will continue to progress as tolerated.   Goals are ongoing. Plan to add stairs next aquatic visit.    Re-cert: Pt had injection by Dr Genelle 11/20/23 and presents at aquatic therapy with additional orders for aquatic PT intervention to begin. Pt presents  without SPC. Pt has had at least a 3 week hiatus from therapy. Testing completed.  Results variable.  LEFS has declined steadily since evaluation with her lowest score today.  Likely do to her long duration of care and more exaggerated perception of her lack of ability. Strength testing partly completed with hand dynamometer.  She has obvious deficits in right hip abduction. Gait is with a marked trendelenburg. Pt is encouraged to use SPC when amb to aquatics due to distance and gross limp to improve gait and reduce LBP which she reports is chronic.  She reports she will be leaving for [place]  dec 23 for the remainder of the winter. Plan to progress aquatics for the next few week then will assess returning to land. She is a good candidate for aquatic intervention and will benefit from the properties of water to progress towards functional goals.      OBJECTIVE IMPAIRMENTS: Abnormal gait, decreased balance, decreased coordination, difficulty walking, decreased strength, increased edema, impaired flexibility, improper body mechanics, postural dysfunction, and pain.   ACTIVITY LIMITATIONS: lifting, bending, squatting, stairs, and locomotion level  PARTICIPATION LIMITATIONS: cleaning, laundry, shopping, community activity, and yard work  PERSONAL FACTORS: Age are also affecting patient's functional outcome.   REHAB POTENTIAL: Excellent  CLINICAL DECISION MAKING: Evolving/moderate complexity  EVALUATION COMPLEXITY: Moderate   GOALS: Goals reviewed with patient? Yes  SHORT TERM GOALS: Target date: 08/09/23  Pt will be ind with initial HEP Baseline: Goal status: Met on 08/01/23  2.  Pt will be educated on movements to avoid to follow post-op protocol to protect tendon transfer site Baseline:  Goal status: MET  3.  Pt will demo improved Rt hip control in stance phase of gait with single walking  stick as needed. Baseline:  Goal status: MET 08/07/23    LONG TERM GOALS: Target date:  01/17/2024   Pt will be ind with advanced HEP Baseline:  Goal status: ongoing  2.  Pt will improve LEFS score to at least 55/80 to demo improved function Baseline: 44/80 Goal status: ongoing  3.  Pt will perform 6 min walk test covering at least 700' with walking stick as needed Baseline:  Goal status: MET 08/07/23 with walking stick  4.  Pt will be able to demo squat, lift 10lb, carry 10lb and walk to mimic household chores and yard activities with proper body mechanics without pain. Baseline:  Goal status: ongoing  5.  Pt will improve glut strength to at least 4+/5 to improve gait mechanics, stairs and transfers. Baseline:  Goal status: Progressing   6.  5x STS improved to 11 sec or less to demo improved functional strength and reduce fall risk. Baseline: 13.23 Goal status: ongoing    7. Pt will tolerate walking to and from setting and engaging in aquatic therapy session without excessive fatigue or increase in pain to demonstrate improved toleration to activity.  Baseline:  Goal Status: NEW   8. Pt will tolerate stair climbing using alternating pattern ascending and descending 6 steps with or without use of handrail   Baseline:  Goal Status: NEW   9. Pt will improve strength in right hip abd by at least 5lbs (using HD) to reduce trendelenburg gait pattern and improve function  Baseline: marked pattern  Goal Status: NEW  PLAN:  PT FREQUENCY: 2x/week  PT DURATION: 8 weeks  PLANNED INTERVENTIONS: 97110-Therapeutic exercises, 97530- Therapeutic activity, [id number]- Neuromuscular re-education, 97535- Self Care, [id number]- Manual therapy, [phone]- Gait training, Patient/Family education, Balance training, Stair training, Cryotherapy, and Moist heat.  PLAN FOR NEXT SESSION: assess response to  aquatic therapy  Delon Aquas, PTA 11/29/23 2:19 PM [facility] [address] [place], [place], [address] Phone: [phone]    Fax:  [phone]

## 2023-12-02 ENCOUNTER — Encounter: Payer: Self-pay | Admitting: Radiology

## 2023-12-03 ENCOUNTER — Encounter (HOSPITAL_BASED_OUTPATIENT_CLINIC_OR_DEPARTMENT_OTHER): Payer: Self-pay | Admitting: Physical Therapy

## 2023-12-03 ENCOUNTER — Ambulatory Visit (HOSPITAL_BASED_OUTPATIENT_CLINIC_OR_DEPARTMENT_OTHER): Payer: Self-pay | Attending: Orthopaedic Surgery | Admitting: Physical Therapy

## 2023-12-03 DIAGNOSIS — R262 Difficulty in walking, not elsewhere classified: Secondary | ICD-10-CM | POA: Diagnosis not present

## 2023-12-03 DIAGNOSIS — M6281 Muscle weakness (generalized): Secondary | ICD-10-CM | POA: Insufficient documentation

## 2023-12-03 DIAGNOSIS — R252 Cramp and spasm: Secondary | ICD-10-CM | POA: Diagnosis not present

## 2023-12-03 DIAGNOSIS — M25551 Pain in right hip: Secondary | ICD-10-CM | POA: Insufficient documentation

## 2023-12-03 DIAGNOSIS — R2689 Other abnormalities of gait and mobility: Secondary | ICD-10-CM | POA: Diagnosis not present

## 2023-12-03 NOTE — Therapy (Signed)
 OUTPATIENT PHYSICAL THERAPY TREATMENT    Patient Name: Rebecca Long MRN: [id number] DOB:08-Jul-1950, 73 y.o., female Today's Date: 12/03/2023   END OF SESSION:  PT End of Session - 12/03/23 0903     Visit Number 32    Number of Visits 45    Date for Recertification  01/17/24    Authorization Type HTA no auth required    Progress Note Due on Visit 39    PT Start Time [phone]   pt arrived late   PT Stop Time 0930    PT Time Calculation (min) 39 min    Activity Tolerance Patient tolerated treatment well    Behavior During Therapy [facility] for tasks assessed/performed          Past Medical History:  Diagnosis Date   Anxiety    Cavus foot, acquired 09/09/2012    bilateral cavus feet the one on the left shows that she has some probable neurogenic weakness with abnormal curvature and in inversion contracture Podiatry 06/2015: H&P and x-ray reviewed with patient. Today I went ahead and I did a proximal nerve block I was able to aspirate the second MPJ and got out amount of clear fluid and injected with a quarter cc dexamethasone  Kenalog  and ap   DDD (degenerative disc disease), lumbar    Depression    Endometrial polyp    GERD (gastroesophageal reflux disease)    Hiatal hernia    Hip flexor tendinitis, right 07/02/2016   History of adenomatous polyp of colon    History of esophageal stricture    History of gastric polyp 11/2017   History of PCR DNA positive for HSV2 11/12/2016   History of primary hyperparathyroidism    s/p  left parathryoidectomy 09-17-2008-- resolved   Hyperlipidemia    Hypertension    Insomnia    Lumbar stenosis    Osteopenia    PONV (postoperative nausea and vomiting)    Schatzki's ring    Scoliosis    Tendinopathy of right biceps tendon 07/02/2016   Thickened endometrium    Uterine fibroid    UTI (urinary tract infection) due to Enterococcus 01/05/2020   Wears glasses    Past Surgical History:  Procedure Laterality Date   BREAST REDUCTION SURGERY Bilateral  2000   COLONOSCOPY     CYST EXCISION  1985   head and rt axilla   DILATATION & CURETTAGE/HYSTEROSCOPY WITH MYOSURE N/A 08/13/2018   Procedure: DILATATION & CURETTAGE/HYSTEROSCOPY WITH MYOSURE;  Surgeon: Lavoie, Marie-Lyne, MD;  Location: [facility];  Service: Gynecology;  Laterality: N/A;  requests 10:15am in [place] Gyn block request 30 minutes   DILATION AND CURETTAGE OF UTERUS N/A 08/13/2018   ECTOPIC PREGNANCY SURGERY  1981   OPEN SURGICAL REPAIR OF GLUTEAL TENDON Right 07/02/2023   Procedure: REPAIR, TENDON, GLUTEUS MEDIUS, OPEN;  Surgeon: Genelle Standing, MD;  Location: [facility];  Service: Orthopedics;  Laterality: Right;  RIGHT GLUTEUS MAXIMUS TENDON TRANSFER WITH COLLAGEN PATCH AUGMENTATION   PARATHYROIDECTOMY Left 09/17/2008   dr jeoffrey dawn  @[facility]    left superior  (adenoma)   REDUCTION MAMMAPLASTY Bilateral 2000   years ago   SHOULDER SURGERY Right 2007   frozen   UPPER GASTROINTESTINAL ENDOSCOPY  last one 12-18-2017   dr wilhelmenia   Patient Active Problem List   Diagnosis Date Noted   Tendinopathy of gluteus medius 07/02/2023   Greater trochanteric bursitis of right hip 04/17/2022   C. difficile diarrhea 03/08/2022   Arthritis of right sacroiliac joint 02/26/2022  Encounter for monitoring long-term proton pump inhibitor therapy 07/05/2021   Hx of colonic polyps 06/06/2021   Incontinence of feces with fecal urgency 06/06/2021   Sleep disorder 11/29/2020   Benign paroxysmal positional vertigo of right ear 08/30/2020   AC (acromioclavicular) arthritis 10/22/2019   Adhesive bursitis of left shoulder 07/22/2019   GERD with esophagitis 12/30/2017   Hyperlipidemia 05/15/2017   Morbid obesity (HCC) 11/12/2016   Essential hypertension, benign 10/06/2014   Depression with anxiety 10/06/2014   Stress incontinence 10/06/2014   Osteoporosis 10/06/2014   Degenerative disc disease, lumbar 09/09/2012   Spinal stenosis of lumbar region 09/09/2012     PCP: Charlies Bellini, DO  REFERRING PROVIDER: Elspeth Parker, MD  REFERRING DIAG: post-op REPAIR, TENDON, GLUTEUS MEDIUS, OPEN (Right) - RIGHT GLUTEUS MAXIMUS TENDON TRANSFER WITH COLLAGEN PATCH AUGMENTATION  Rationale for Evaluation and Treatment: Rehabilitation  THERAPY DIAG:  Muscle weakness (generalized)  Difficulty in walking, not elsewhere classified  Pain in right hip  Cramp and spasm  ONSET DATE: 07/02/23 surgery date - chronic pain and weakness prior to surgery  SUBJECTIVE:                                                                                                                                                                                           SUBJECTIVE STATEMENT: Pt reports she was unable to do Rt leg lift when laying in bed on Lt side this week; not sure why.  She voices frustration with difficulty walking without cane.   PAIN:   Are you having pain? no NPRS scale: 0/10 soreness Pain location:  Pain orientation:  PAIN TYPE: aching Pain description:  Aggravating factors: laying on Rt side, in/out of car, walking Relieving factors: sitting   PRECAUTIONS: Other: protocol from Dr. Parker scanned into media section of chart - avoid until 6 weeks post-op which is 08/13/23: passive ADDuction and ER, active ABDuction and IR  RED FLAGS: None   WEIGHT BEARING RESTRICTIONS: PWB initially, doctor just cleared to get rid of walking stick when ready  FALLS:  Has patient fallen in last 6 months? Yes. Number of falls 1, while carrying a planter and going up stairs  LIVING ENVIRONMENT: Lives with: lives with their spouse Lives in: House/apartment Stairs: Yes: External: 3 steps; on left going up Has following equipment at home: Single point cane  OCCUPATION: retired  PLOF: Independent  PATIENT GOALS: walk without pain and walking stick, can I get back to pickleball?  NEXT MD VISIT: 3 weeks  OBJECTIVE:  Note: Objective measures were completed at  Evaluation unless otherwise noted.  DIAGNOSTIC FINDINGS:  Xray (3 views right hip): No  femoral acetabular osteoarthritis   MRI (right hip): There is an end-stage full-thickness tear of the gluteus medius with significant atrophy on T1 coronal view  PATIENT SURVEYS:  LEFS: 44/80 7/16: LEFS  42/80 9/4: 32/80 10/23: 27/80  COGNITION: Overall cognitive status: Within functional limits for tasks assessed     SENSATION: WFL  MUSCLE LENGTH:   POSTURE: flexed trunk   PALPATION: Mild tenderness around incision - steri strips placed today after stitches were removed  LUMBAR ROM:  WFL  LOWER EXTREMITY ROM:    Pt able to perform seated march A/ROM and quadruped rocking for hip flexion beyond 90 deg without pain  11/21/23 Right hip IR/ER full  LOWER EXTREMITY MMT:   Lt LE 4+/5 Rt hip at least 3+/5 - isometric testing only, resisted isometrics strong and painfree  11/21/23 Muscle testing using HD (LBS) R hip flex 38.6  abd 20.6 L hip flex 38.6  abd 31.6  MMT right hip extension 3+  Right hip abd 3 to 3+  FUNCTIONAL TESTS:  Eval: 5 times sit to stand: 13.23 Timed up and go (TUG): 12.24 using walking stick 2 MWT: 315 feet using single walking stick in Lt UE  08/01/2023: 6 minute walk test:  759 ft with [facility] 08/07/23: 6 minute walk test: 781' with [facility] 7/15: 5x STS with hands: 10.40 5x STS no hands 11.19 TUG: with cane: 9.29 sec  09/05/23: 6 MWT 1010 feet with SPC 5x STS no hands 9.94  11/21/23 5x STS from bench: 15.04 GAIT: Distance walked: 315' in 2 min  Assistive device utilized: single walking stick Level of assistance: Modified independence Comments: Rt early stance phase Trendelenburg  TREATMENT DATE: [facility] Adult PT Treatment:                                             Date: 12/03/23 Pt seen for aquatic therapy today.  Treatment took place in water 3.5-4.75 ft in depth at the [facility] pool. Temp of water was 91.  Pt entered/exited the pool via  stairs in step-to, step-through pattern independently with bil rail.   - unsupported walking forward/ backward, cues for vertical trunk and even step length - side stepping with arm add/abdct with yellow hand floats x 2 laps - UE on yellow hand floats: toe/heel raises x10; 2x10 leg swings into hip flex/extension and hip abdct/ add (2nd set quicker/smaller range) - noodle stomp with hollow noodle, 10 slow, 10 quick each LE, in hip flexion repeated with solid noodle into flexion and abdct (improved) - standing balance on solid noodle-> squats x 10 - side plank on bench in water with x 5 starfish each side (challenge on L side) - plank on bench in water with hip ext x 10 each LE; fire hydrants x 5 each - STS from 3rd step in water x10   [facility] Adult PT Treatment:                                             Date: 11/25/23 Pt seen for aquatic therapy today.  Treatment took place in water 3.5-4.75 ft in depth at the [facility] pool. Temp of water was 91.  Pt entered/exited the pool via stairs in step-to, step-through pattern independently with bil rail.  -  Intro to aquatic therapy principles - unsupported walking forward/ backward, cues for vertical trunk - unsupported side stepping -> with arm add/abdct with rainbow hand floats - suitcase carry with bil/single rainbow hand float by side, marching forward/backward - UE on wall:  toe/heel raises x10; hip add/abdct x10 ; hip flexion /extension x10; relaxed squats x 3 - marching forward/backward - UE on rainbow hand floats: SLS x 20sec each; 3 way LE kick 2x5 in R SLS, 1x5 L SLS, cues for  - TrA set with long hollow noodle pull down to thighs 2x 5 in standard stance and staggered stance  Pt requires the buoyancy and hydrostatic pressure of water for support, and to offload joints by unweighting joint load by at least 50 % in navel deep water and by at least 75-80% in chest to neck deep water.  Viscosity of the water is needed for resistance  of strengthening. Water current perturbations provides challenge to standing balance requiring increased core activation.     [facility] Adult PT Treatment:                                                DATE: 11/21/23 Re-assessment testing: balance, 5 x STS, strength   10/31/23 bike x 6 minutes- PT present to discuss progress  Supine blue band hip extension 20x  Prone glute squeeze 10x Prone over 1 pillow hip extension 10x right/left Side planks 10x right only Supine propped on wedge: hip thruster with pair of 5# weights on hips 2 x10 Sit to stand pair of 5# dumbbells Lateral lean to the left on the back of the UBE seat: left hip flexion (partially unloading right LE) Cable walk 10x backwards only with belt: 10x Hip machine 30# right only: hip abduction and extension 2 x10   10/29/23 Nu-Step  level 5 x 6 minutes- PT present to discuss progress  6 inch step ups  right leading 10x 2 hand support, lateral step up x10 on Rt  WB on right with step taps 10x using 8 steps Single leg stance on Rt with lunge taps to colored discs on floor-forward and lateral bil  Resisted walking -Side stepping: 10# x8 each with close CGA Leg press seat 6 85# bil 2x10; 45# single leg 2x10 right/left At the stairs 2nd step hip flexor, gastroc and quadratus lumborum muscle lengthening 3 sets of 5 right/left: Rocking forward and back with arm reach up and over Sidestepping lateral over hurdles x4 laps  10/24/23  Nu-Step  level 5 x 6 minutes- PT present to discuss progress  6 inch step ups  right leading 10x 2 hand support WB on right with step taps 10x  Side stepping with pink power cord 8x right/left Side wall plank left: with left hip flexion 2x10 Side wall plank to the left with left hip abduction 2x10 At the stairs 2nd step hip flexor, gastroc and quadratus lumborum muscle lengthening 3 sets of 5 right/left: Rocking forward and back with arm reach up and over 10/22/23 Bike: level 2x 6 minutes- PT present to  discuss progress  Side step cable 5 # with belt 7x right/left At the stairs 2nd step hip flexor, gastroc and quadratus lumborum muscle lengthening 3 sets of 5 right/left: Rocking forward and back with arm reach up and over 2 rounds 1 min rest break b/w rounds:   10 lateral sliders  each side right/left 6 clockwise and counterclock slider circles right/left 10 single leg march with 5# weight in same side hand right/left   Side wall plank:with left hip flexion 10x needs UE support  Bridge with feet on 8 inch step 10x Supine with heel taps for core activation 2x10 Side bridge 8x right/left  PATIENT EDUCATION:  Education details: [email or web address] Person educated: Patient Education method: Explanation, Demonstration, Verbal cues, and Handouts Education comprehension: verbalized understanding and returned demonstration  HOME EXERCISE PROGRAM: Access Code: [email or web address] URL: [email or web address] Date: 07/12/2023 Prepared by: Orvil Beuhring  Exercises - Supine Posterior Pelvic Tilt  - 2 x daily - 7 x weekly - 2 sets - 10 reps - Supine Short Arc Quad  - 2 x daily - 7 x weekly - 2 sets - 10 reps - Supine Hip Adduction Isometric with Ball  - 2 x daily - 7 x weekly - 2 sets - 10 reps - 5 hold - Quadruped Rocking Backward  - 2 x daily - 7 x weekly - 1-2 sets - 10 reps - 5 hold - Seated Isometric Hip Abduction  - 2 x daily - 7 x weekly - 2 sets - 10 reps - 5 hold - Standing Knee Flexion AROM with Chair Support  - 2 x daily - 7 x weekly - 2 sets - 10 reps  ASSESSMENT:  CLINICAL IMPRESSION: Pt reported good toleration of aquatic exercises without increase in symptoms. She demonstrates improved balance when performing noodle stomp in SLS; did not require UE support on wall to complete.  Will continue to progress as tolerated.   Goals are ongoing. Plan to add stairs next aquatic visit.    Re-cert: Pt had injection by Dr Genelle 11/20/23 and presents at aquatic therapy with additional orders  for aquatic PT intervention to begin. Pt presents without SPC. Pt has had at least a 3 week hiatus from therapy. Testing completed.  Results variable.  LEFS has declined steadily since evaluation with her lowest score today.  Likely do to her long duration of care and more exaggerated perception of her lack of ability. Strength testing partly completed with hand dynamometer.  She has obvious deficits in right hip abduction. Gait is with a marked trendelenburg. Pt is encouraged to use SPC when amb to aquatics due to distance and gross limp to improve gait and reduce LBP which she reports is chronic.  She reports she will be leaving for [place]  dec 23 for the remainder of the winter. Plan to progress aquatics for the next few week then will assess returning to land. She is a good candidate for aquatic intervention and will benefit from the properties of water to progress towards functional goals.      OBJECTIVE IMPAIRMENTS: Abnormal gait, decreased balance, decreased coordination, difficulty walking, decreased strength, increased edema, impaired flexibility, improper body mechanics, postural dysfunction, and pain.   ACTIVITY LIMITATIONS: lifting, bending, squatting, stairs, and locomotion level  PARTICIPATION LIMITATIONS: cleaning, laundry, shopping, community activity, and yard work  PERSONAL FACTORS: Age are also affecting patient's functional outcome.   REHAB POTENTIAL: Excellent  CLINICAL DECISION MAKING: Evolving/moderate complexity  EVALUATION COMPLEXITY: Moderate   GOALS: Goals reviewed with patient? Yes  SHORT TERM GOALS: Target date: 08/09/23  Pt will be ind with initial HEP Baseline: Goal status: Met on 08/01/23  2.  Pt will be educated on movements to avoid to follow post-op protocol to protect tendon transfer site Baseline:  Goal status: MET  3.  Pt will demo improved Rt hip  control in stance phase of gait with single walking stick as needed. Baseline:  Goal status: MET  08/07/23    LONG TERM GOALS: Target date: 01/17/2024   Pt will be ind with advanced HEP Baseline:  Goal status: ongoing  2.  Pt will improve LEFS score to at least 55/80 to demo improved function Baseline: 44/80 Goal status: ongoing  3.  Pt will perform 6 min walk test covering at least 700' with walking stick as needed Baseline:  Goal status: MET 08/07/23 with walking stick  4.  Pt will be able to demo squat, lift 10lb, carry 10lb and walk to mimic household chores and yard activities with proper body mechanics without pain. Baseline:  Goal status: ongoing  5.  Pt will improve glut strength to at least 4+/5 to improve gait mechanics, stairs and transfers. Baseline:  Goal status: Progressing   6.  5x STS improved to 11 sec or less to demo improved functional strength and reduce fall risk. Baseline: 13.23 Goal status: ongoing    7. Pt will tolerate walking to and from setting and engaging in aquatic therapy session without excessive fatigue or increase in pain to demonstrate improved toleration to activity.  Baseline:  Goal Status: NEW   8. Pt will tolerate stair climbing using alternating pattern ascending and descending 6 steps with or without use of handrail   Baseline:  Goal Status: NEW   9. Pt will improve strength in right hip abd by at least 5lbs (using HD) to reduce trendelenburg gait pattern and improve function  Baseline: marked pattern  Goal Status: NEW  PLAN:  PT FREQUENCY: 2x/week  PT DURATION: 8 weeks  PLANNED INTERVENTIONS: 97110-Therapeutic exercises, 97530- Therapeutic activity, [id number]- Neuromuscular re-education, 97535- Self Care, [id number]- Manual therapy, [phone]- Gait training, Patient/Family education, Balance training, Stair training, Cryotherapy, and Moist heat.  PLAN FOR NEXT SESSION: assess response to  aquatic therapy  Delon Aquas, PTA 12/03/23 12:44 PM [facility] [address] [place], [place], [address] Phone: [phone]   Fax:  [phone]

## 2023-12-06 ENCOUNTER — Encounter (HOSPITAL_BASED_OUTPATIENT_CLINIC_OR_DEPARTMENT_OTHER): Payer: Self-pay | Admitting: Physical Therapy

## 2023-12-06 ENCOUNTER — Ambulatory Visit (HOSPITAL_BASED_OUTPATIENT_CLINIC_OR_DEPARTMENT_OTHER): Payer: Self-pay | Admitting: Physical Therapy

## 2023-12-06 DIAGNOSIS — M6281 Muscle weakness (generalized): Secondary | ICD-10-CM | POA: Diagnosis not present

## 2023-12-06 DIAGNOSIS — M25551 Pain in right hip: Secondary | ICD-10-CM

## 2023-12-06 DIAGNOSIS — R262 Difficulty in walking, not elsewhere classified: Secondary | ICD-10-CM

## 2023-12-06 NOTE — Therapy (Signed)
 OUTPATIENT PHYSICAL THERAPY TREATMENT    Patient Name: Rebecca Long MRN: [id number] DOB:08-08-1950, 73 y.o., female Today's Date: 12/06/2023   END OF SESSION:  PT End of Session - 12/06/23 1433     Visit Number 33    Number of Visits 45    Date for Recertification  01/17/24    Authorization Type HTA no auth required    Progress Note Due on Visit 39    PT Start Time 1430    PT Stop Time 1508    PT Time Calculation (min) 38 min    Behavior During Therapy WFL for tasks assessed/performed          Past Medical History:  Diagnosis Date   Anxiety    Cavus foot, acquired 09/09/2012    bilateral cavus feet the one on the left shows that she has some probable neurogenic weakness with abnormal curvature and in inversion contracture Podiatry 06/2015: H&P and x-ray reviewed with patient. Today I went ahead and I did a proximal nerve block I was able to aspirate the second MPJ and got out amount of clear fluid and injected with a quarter cc dexamethasone  Kenalog  and ap   DDD (degenerative disc disease), lumbar    Depression    Endometrial polyp    GERD (gastroesophageal reflux disease)    Hiatal hernia    Hip flexor tendinitis, right 07/02/2016   History of adenomatous polyp of colon    History of esophageal stricture    History of gastric polyp 11/2017   History of PCR DNA positive for HSV2 11/12/2016   History of primary hyperparathyroidism    s/p  left parathryoidectomy 09-17-2008-- resolved   Hyperlipidemia    Hypertension    Insomnia    Lumbar stenosis    Osteopenia    PONV (postoperative nausea and vomiting)    Schatzki's ring    Scoliosis    Tendinopathy of right biceps tendon 07/02/2016   Thickened endometrium    Uterine fibroid    UTI (urinary tract infection) due to Enterococcus 01/05/2020   Wears glasses    Past Surgical History:  Procedure Laterality Date   BREAST REDUCTION SURGERY Bilateral 2000   COLONOSCOPY     CYST EXCISION  1985   head and rt axilla    DILATATION & CURETTAGE/HYSTEROSCOPY WITH MYOSURE N/A 08/13/2018   Procedure: DILATATION & CURETTAGE/HYSTEROSCOPY WITH MYOSURE;  Surgeon: Lavoie, Marie-Lyne, MD;  Location: [facility];  Service: Gynecology;  Laterality: N/A;  requests 10:15am in [place] Gyn block request 30 minutes   DILATION AND CURETTAGE OF UTERUS N/A 08/13/2018   ECTOPIC PREGNANCY SURGERY  1981   OPEN SURGICAL REPAIR OF GLUTEAL TENDON Right 07/02/2023   Procedure: REPAIR, TENDON, GLUTEUS MEDIUS, OPEN;  Surgeon: Genelle Standing, MD;  Location: [facility];  Service: Orthopedics;  Laterality: Right;  RIGHT GLUTEUS MAXIMUS TENDON TRANSFER WITH COLLAGEN PATCH AUGMENTATION   PARATHYROIDECTOMY Left 09/17/2008   dr jeoffrey dawn  @[facility]    left superior  (adenoma)   REDUCTION MAMMAPLASTY Bilateral 2000   years ago   SHOULDER SURGERY Right 2007   frozen   UPPER GASTROINTESTINAL ENDOSCOPY  last one 12-18-2017   dr wilhelmenia   Patient Active Problem List   Diagnosis Date Noted   Tendinopathy of gluteus medius 07/02/2023   Greater trochanteric bursitis of right hip 04/17/2022   C. difficile diarrhea 03/08/2022   Arthritis of right sacroiliac joint 02/26/2022   Encounter for monitoring long-term proton pump inhibitor therapy 07/05/2021  Hx of colonic polyps 06/06/2021   Incontinence of feces with fecal urgency 06/06/2021   Sleep disorder 11/29/2020   Benign paroxysmal positional vertigo of right ear 08/30/2020   AC (acromioclavicular) arthritis 10/22/2019   Adhesive bursitis of left shoulder 07/22/2019   GERD with esophagitis 12/30/2017   Hyperlipidemia 05/15/2017   Morbid obesity (HCC) 11/12/2016   Essential hypertension, benign 10/06/2014   Depression with anxiety 10/06/2014   Stress incontinence 10/06/2014   Osteoporosis 10/06/2014   Degenerative disc disease, lumbar 09/09/2012   Spinal stenosis of lumbar region 09/09/2012    PCP: Charlies Bellini, DO  REFERRING PROVIDER: Elspeth Parker,  MD  REFERRING DIAG: post-op REPAIR, TENDON, GLUTEUS MEDIUS, OPEN (Right) - RIGHT GLUTEUS MAXIMUS TENDON TRANSFER WITH COLLAGEN PATCH AUGMENTATION  Rationale for Evaluation and Treatment: Rehabilitation  THERAPY DIAG:  Muscle weakness (generalized)  Difficulty in walking, not elsewhere classified  Pain in right hip  ONSET DATE: 07/02/23 surgery date - chronic pain and weakness prior to surgery  SUBJECTIVE:                                                                                                                                                                                           SUBJECTIVE STATEMENT: I want to get better faster.  Pt reporting no changes since last visit.   PAIN:   Are you having pain? no NPRS scale: 0/10  Pain location:  Pain orientation:  PAIN TYPE:  Pain description:  Aggravating factors: laying on Rt side, in/out of car, walking Relieving factors: sitting   PRECAUTIONS: Other: protocol from Dr. Parker scanned into media section of chart - avoid until 6 weeks post-op which is 08/13/23: passive ADDuction and ER, active ABDuction and IR  RED FLAGS: None   WEIGHT BEARING RESTRICTIONS: PWB initially, doctor just cleared to get rid of walking stick when ready  FALLS:  Has patient fallen in last 6 months? Yes. Number of falls 1, while carrying a planter and going up stairs  LIVING ENVIRONMENT: Lives with: lives with their spouse Lives in: House/apartment Stairs: Yes: External: 3 steps; on left going up Has following equipment at home: Single point cane  OCCUPATION: retired  PLOF: Independent  PATIENT GOALS: walk without pain and walking stick, can I get back to pickleball?  NEXT MD VISIT: 3 weeks  OBJECTIVE:  Note: Objective measures were completed at Evaluation unless otherwise noted.  DIAGNOSTIC FINDINGS:  Xray (3 views right hip): No femoral acetabular osteoarthritis   MRI (right hip): There is an end-stage full-thickness tear  of the gluteus medius with significant atrophy on T1 coronal view  PATIENT SURVEYS:  LEFS: 44/80 7/16:  LEFS  42/80 9/4: 32/80 10/23: 27/80  COGNITION: Overall cognitive status: Within functional limits for tasks assessed     SENSATION: WFL  MUSCLE LENGTH:   POSTURE: flexed trunk   PALPATION: Mild tenderness around incision - steri strips placed today after stitches were removed  LUMBAR ROM:  WFL  LOWER EXTREMITY ROM:    Pt able to perform seated march A/ROM and quadruped rocking for hip flexion beyond 90 deg without pain  11/21/23 Right hip IR/ER full  LOWER EXTREMITY MMT:   Lt LE 4+/5 Rt hip at least 3+/5 - isometric testing only, resisted isometrics strong and painfree  11/21/23 Muscle testing using HD (LBS) R hip flex 38.6  abd 20.6 L hip flex 38.6  abd 31.6  MMT right hip extension 3+  Right hip abd 3 to 3+  FUNCTIONAL TESTS:  Eval: 5 times sit to stand: 13.23 Timed up and go (TUG): 12.24 using walking stick 2 MWT: 315 feet using single walking stick in Lt UE  08/01/2023: 6 minute walk test:  759 ft with [facility] 08/07/23: 6 minute walk test: 781' with [facility] 7/15: 5x STS with hands: 10.40 5x STS no hands 11.19 TUG: with cane: 9.29 sec  09/05/23: 6 MWT 1010 feet with SPC 5x STS no hands 9.94  11/21/23 5x STS from bench: 15.04 GAIT: Distance walked: 315' in 2 min  Assistive device utilized: single walking stick Level of assistance: Modified independence Comments: Rt early stance phase Trendelenburg  TREATMENT DATE: [facility] Adult PT Treatment:                                             Date: 12/06/23 Pt seen for aquatic therapy today.  Treatment took place in water 3.5-4.75 ft in depth at the [facility] pool. Temp of water was 91.  Pt entered/exited the pool via stairs in step-to, step-through pattern independently with bil rail.   - unsupported walking forward/ backward, cues for vertical trunk and even step length - side stepping into wide  squat with arm add/abdct with yellow ->rainbow hand floats x 2 laps - UE on wall: split squats x 8 each LE -> walking split squats with UE On rainbow hand floats - plank with yellow hand floats with alternating hip extension 2 x 5 (difficulty maintaining balance) - squats pushing rainbow hand float under water x 10 - UE on yellow hand floats: single leg squats 2x 5; Rt heel raises x10; 3 way LE kick 2x10 RLE, 1x10 LLE - single leg forward leans with yellow hand floats x 10 RLE ,x 5 LLE  [facility] Adult PT Treatment:                                             Date: 12/03/23 Pt seen for aquatic therapy today.  Treatment took place in water 3.5-4.75 ft in depth at the [facility] pool. Temp of water was 91.  Pt entered/exited the pool via stairs in step-to, step-through pattern independently with bil rail.   - unsupported walking forward/ backward, cues for vertical trunk and even step length - side stepping with arm add/abdct with yellow hand floats x 2 laps - UE on yellow hand floats: toe/heel raises x10; 2x10 leg swings into hip flex/extension and hip  abdct/ add (2nd set quicker/smaller range) - noodle stomp with hollow noodle, 10 slow, 10 quick each LE, in hip flexion repeated with solid noodle into flexion and abdct (improved) - standing balance on solid noodle-> squats x 10 - side plank on bench in water with x 5 starfish each side (challenge on L side) - plank on bench in water with hip ext x 10 each LE; fire hydrants x 5 each - STS from 3rd step in water x10   [facility] Adult PT Treatment:                                             Date: 11/25/23 Pt seen for aquatic therapy today.  Treatment took place in water 3.5-4.75 ft in depth at the [facility] pool. Temp of water was 91.  Pt entered/exited the pool via stairs in step-to, step-through pattern independently with bil rail.  - Intro to aquatic therapy principles - unsupported walking forward/ backward, cues for vertical  trunk - unsupported side stepping -> with arm add/abdct with rainbow hand floats - suitcase carry with bil/single rainbow hand float by side, marching forward/backward - UE on wall:  toe/heel raises x10; hip add/abdct x10 ; hip flexion /extension x10; relaxed squats x 3 - marching forward/backward - UE on rainbow hand floats: SLS x 20sec each; 3 way LE kick 2x5 in R SLS, 1x5 L SLS, cues for  - TrA set with long hollow noodle pull down to thighs 2x 5 in standard stance and staggered stance  Pt requires the buoyancy and hydrostatic pressure of water for support, and to offload joints by unweighting joint load by at least 50 % in navel deep water and by at least 75-80% in chest to neck deep water.  Viscosity of the water is needed for resistance of strengthening. Water current perturbations provides challenge to standing balance requiring increased core activation.     [facility] Adult PT Treatment:                                                DATE: 11/21/23 Re-assessment testing: balance, 5 x STS, strength   10/31/23 bike x 6 minutes- PT present to discuss progress  Supine blue band hip extension 20x  Prone glute squeeze 10x Prone over 1 pillow hip extension 10x right/left Side planks 10x right only Supine propped on wedge: hip thruster with pair of 5# weights on hips 2 x10 Sit to stand pair of 5# dumbbells Lateral lean to the left on the back of the UBE seat: left hip flexion (partially unloading right LE) Cable walk 10x backwards only with belt: 10x Hip machine 30# right only: hip abduction and extension 2 x10   10/29/23 Nu-Step  level 5 x 6 minutes- PT present to discuss progress  6 inch step ups  right leading 10x 2 hand support, lateral step up x10 on Rt  WB on right with step taps 10x using 8 steps Single leg stance on Rt with lunge taps to colored discs on floor-forward and lateral bil  Resisted walking -Side stepping: 10# x8 each with close CGA Leg press seat 6 85# bil 2x10; 45#  single leg 2x10 right/left At the stairs 2nd step hip flexor, gastroc and quadratus  lumborum muscle lengthening 3 sets of 5 right/left: Rocking forward and back with arm reach up and over Sidestepping lateral over hurdles x4 laps  10/24/23  Nu-Step  level 5 x 6 minutes- PT present to discuss progress  6 inch step ups  right leading 10x 2 hand support WB on right with step taps 10x  Side stepping with pink power cord 8x right/left Side wall plank left: with left hip flexion 2x10 Side wall plank to the left with left hip abduction 2x10 At the stairs 2nd step hip flexor, gastroc and quadratus lumborum muscle lengthening 3 sets of 5 right/left: Rocking forward and back with arm reach up and over 10/22/23 Bike: level 2x 6 minutes- PT present to discuss progress  Side step cable 5 # with belt 7x right/left At the stairs 2nd step hip flexor, gastroc and quadratus lumborum muscle lengthening 3 sets of 5 right/left: Rocking forward and back with arm reach up and over 2 rounds 1 min rest break b/w rounds:   10 lateral sliders each side right/left 6 clockwise and counterclock slider circles right/left 10 single leg march with 5# weight in same side hand right/left   Side wall plank:with left hip flexion 10x needs UE support  Bridge with feet on 8 inch step 10x Supine with heel taps for core activation 2x10 Side bridge 8x right/left  PATIENT EDUCATION:  Education details: [email or web address] Person educated: Patient Education method: Explanation, Demonstration, Verbal cues, and Handouts Education comprehension: verbalized understanding and returned demonstration  HOME EXERCISE PROGRAM: Access Code: [email or web address] URL: [email or web address] Date: 07/12/2023 Prepared by: Orvil Beuhring  Exercises - Supine Posterior Pelvic Tilt  - 2 x daily - 7 x weekly - 2 sets - 10 reps - Supine Short Arc Quad  - 2 x daily - 7 x weekly - 2 sets - 10 reps - Supine Hip Adduction Isometric with Ball  - 2 x  daily - 7 x weekly - 2 sets - 10 reps - 5 hold - Quadruped Rocking Backward  - 2 x daily - 7 x weekly - 1-2 sets - 10 reps - 5 hold - Seated Isometric Hip Abduction  - 2 x daily - 7 x weekly - 2 sets - 10 reps - 5 hold - Standing Knee Flexion AROM with Chair Support  - 2 x daily - 7 x weekly - 2 sets - 10 reps  ASSESSMENT:  CLINICAL IMPRESSION: Pt reported good toleration of progression of aquatic exercises without increase in symptoms. Continued work on loading RLE in SLS exercises in pool with support of water. Will continue to progress as tolerated.   Goals are ongoing. Plan to add stairs next aquatic visit.    Re-cert: Pt had injection by Dr Genelle 11/20/23 and presents at aquatic therapy with additional orders for aquatic PT intervention to begin. Pt presents without SPC. Pt has had at least a 3 week hiatus from therapy. Testing completed.  Results variable.  LEFS has declined steadily since evaluation with her lowest score today.  Likely do to her long duration of care and more exaggerated perception of her lack of ability. Strength testing partly completed with hand dynamometer.  She has obvious deficits in right hip abduction. Gait is with a marked trendelenburg. Pt is encouraged to use SPC when amb to aquatics due to distance and gross limp to improve gait and reduce LBP which she reports is chronic.  She reports she will be leaving for [place]  dec 23 for the remainder of the winter. Plan  to progress aquatics for the next few week then will assess returning to land. She is a good candidate for aquatic intervention and will benefit from the properties of water to progress towards functional goals.      OBJECTIVE IMPAIRMENTS: Abnormal gait, decreased balance, decreased coordination, difficulty walking, decreased strength, increased edema, impaired flexibility, improper body mechanics, postural dysfunction, and pain.   ACTIVITY LIMITATIONS: lifting, bending, squatting, stairs, and  locomotion level  PARTICIPATION LIMITATIONS: cleaning, laundry, shopping, community activity, and yard work  PERSONAL FACTORS: Age are also affecting patient's functional outcome.   REHAB POTENTIAL: Excellent  CLINICAL DECISION MAKING: Evolving/moderate complexity  EVALUATION COMPLEXITY: Moderate   GOALS: Goals reviewed with patient? Yes  SHORT TERM GOALS: Target date: 08/09/23  Pt will be ind with initial HEP Baseline: Goal status: Met on 08/01/23  2.  Pt will be educated on movements to avoid to follow post-op protocol to protect tendon transfer site Baseline:  Goal status: MET  3.  Pt will demo improved Rt hip control in stance phase of gait with single walking stick as needed. Baseline:  Goal status: MET 08/07/23    LONG TERM GOALS: Target date: 01/17/2024   Pt will be ind with advanced HEP Baseline:  Goal status: ongoing  2.  Pt will improve LEFS score to at least 55/80 to demo improved function Baseline: 44/80 Goal status: ongoing  3.  Pt will perform 6 min walk test covering at least 700' with walking stick as needed Baseline:  Goal status: MET 08/07/23 with walking stick  4.  Pt will be able to demo squat, lift 10lb, carry 10lb and walk to mimic household chores and yard activities with proper body mechanics without pain. Baseline:  Goal status: ongoing  5.  Pt will improve glut strength to at least 4+/5 to improve gait mechanics, stairs and transfers. Baseline:  Goal status: Progressing   6.  5x STS improved to 11 sec or less to demo improved functional strength and reduce fall risk. Baseline: 13.23 Goal status: ongoing    7. Pt will tolerate walking to and from setting and engaging in aquatic therapy session without excessive fatigue or increase in pain to demonstrate improved toleration to activity.  Baseline:  Goal Status: NEW   8. Pt will tolerate stair climbing using alternating pattern ascending and descending 6 steps with or without use of  handrail   Baseline:  Goal Status: NEW   9. Pt will improve strength in right hip abd by at least 5lbs (using HD) to reduce trendelenburg gait pattern and improve function  Baseline: marked pattern  Goal Status: NEW  PLAN:  PT FREQUENCY: 2x/week  PT DURATION: 8 weeks  PLANNED INTERVENTIONS: 97110-Therapeutic exercises, 97530- Therapeutic activity, [id number]- Neuromuscular re-education, 97535- Self Care, [id number]- Manual therapy, [id number]- Gait training, Patient/Family education, Balance training, Stair training, Cryotherapy, and Moist heat.  PLAN FOR NEXT SESSION: assess response to  aquatic therapy, stairs in water   [employer], [place] 12/06/23 5:07 PM [facility] [address] [place], [place], [address] Phone: [phone]   Fax:  [phone]

## 2023-12-09 ENCOUNTER — Ambulatory Visit (HOSPITAL_BASED_OUTPATIENT_CLINIC_OR_DEPARTMENT_OTHER): Admitting: Physical Therapy

## 2023-12-09 DIAGNOSIS — M25551 Pain in right hip: Secondary | ICD-10-CM

## 2023-12-09 DIAGNOSIS — M6281 Muscle weakness (generalized): Secondary | ICD-10-CM | POA: Diagnosis not present

## 2023-12-09 DIAGNOSIS — R262 Difficulty in walking, not elsewhere classified: Secondary | ICD-10-CM

## 2023-12-09 NOTE — Therapy (Signed)
 OUTPATIENT PHYSICAL THERAPY TREATMENT    Patient Name: Rebecca Long MRN: [id number] DOB:14-Nov-1950, 73 y.o., female Today's Date: 12/09/2023   END OF SESSION:  PT End of Session - 12/09/23 1122     Visit Number 34    Number of Visits 45    Date for Recertification  01/17/24    Authorization Type HTA no auth required    Progress Note Due on Visit 39    PT Start Time 1102    PT Stop Time 1140    PT Time Calculation (min) 38 min    Activity Tolerance Patient tolerated treatment well    Behavior During Therapy WFL for tasks assessed/performed           Past Medical History:  Diagnosis Date   Anxiety    Cavus foot, acquired 09/09/2012    bilateral cavus feet the one on the left shows that she has some probable neurogenic weakness with abnormal curvature and in inversion contracture Podiatry 06/2015: H&P and x-ray reviewed with patient. Today I went ahead and I did a proximal nerve block I was able to aspirate the second MPJ and got out amount of clear fluid and injected with a quarter cc dexamethasone  Kenalog  and ap   DDD (degenerative disc disease), lumbar    Depression    Endometrial polyp    GERD (gastroesophageal reflux disease)    Hiatal hernia    Hip flexor tendinitis, right 07/02/2016   History of adenomatous polyp of colon    History of esophageal stricture    History of gastric polyp 11/2017   History of PCR DNA positive for HSV2 11/12/2016   History of primary hyperparathyroidism    s/p  left parathryoidectomy 09-17-2008-- resolved   Hyperlipidemia    Hypertension    Insomnia    Lumbar stenosis    Osteopenia    PONV (postoperative nausea and vomiting)    Schatzki's ring    Scoliosis    Tendinopathy of right biceps tendon 07/02/2016   Thickened endometrium    Uterine fibroid    UTI (urinary tract infection) due to Enterococcus 01/05/2020   Wears glasses    Past Surgical History:  Procedure Laterality Date   BREAST REDUCTION SURGERY Bilateral 2000    COLONOSCOPY     CYST EXCISION  1985   head and rt axilla   DILATATION & CURETTAGE/HYSTEROSCOPY WITH MYOSURE N/A 08/13/2018   Procedure: DILATATION & CURETTAGE/HYSTEROSCOPY WITH MYOSURE;  Surgeon: Lavoie, Marie-Lyne, MD;  Location: [facility];  Service: Gynecology;  Laterality: N/A;  requests 10:15am in [place] Gyn block request 30 minutes   DILATION AND CURETTAGE OF UTERUS N/A 08/13/2018   ECTOPIC PREGNANCY SURGERY  1981   OPEN SURGICAL REPAIR OF GLUTEAL TENDON Right 07/02/2023   Procedure: REPAIR, TENDON, GLUTEUS MEDIUS, OPEN;  Surgeon: Genelle Standing, MD;  Location: [facility];  Service: Orthopedics;  Laterality: Right;  RIGHT GLUTEUS MAXIMUS TENDON TRANSFER WITH COLLAGEN PATCH AUGMENTATION   PARATHYROIDECTOMY Left 09/17/2008   dr jeoffrey dawn  @[facility]    left superior  (adenoma)   REDUCTION MAMMAPLASTY Bilateral 2000   years ago   SHOULDER SURGERY Right 2007   frozen   UPPER GASTROINTESTINAL ENDOSCOPY  last one 12-18-2017   dr wilhelmenia   Patient Active Problem List   Diagnosis Date Noted   Tendinopathy of gluteus medius 07/02/2023   Greater trochanteric bursitis of right hip 04/17/2022   C. difficile diarrhea 03/08/2022   Arthritis of right sacroiliac joint 02/26/2022   Encounter  for monitoring long-term proton pump inhibitor therapy 07/05/2021   Hx of colonic polyps 06/06/2021   Incontinence of feces with fecal urgency 06/06/2021   Sleep disorder 11/29/2020   Benign paroxysmal positional vertigo of right ear 08/30/2020   AC (acromioclavicular) arthritis 10/22/2019   Adhesive bursitis of left shoulder 07/22/2019   GERD with esophagitis 12/30/2017   Hyperlipidemia 05/15/2017   Morbid obesity (HCC) 11/12/2016   Essential hypertension, benign 10/06/2014   Depression with anxiety 10/06/2014   Stress incontinence 10/06/2014   Osteoporosis 10/06/2014   Degenerative disc disease, lumbar 09/09/2012   Spinal stenosis of lumbar region 09/09/2012     PCP: Charlies Bellini, DO  REFERRING PROVIDER: Elspeth Parker, MD  REFERRING DIAG: post-op REPAIR, TENDON, GLUTEUS MEDIUS, OPEN (Right) - RIGHT GLUTEUS MAXIMUS TENDON TRANSFER WITH COLLAGEN PATCH AUGMENTATION  Rationale for Evaluation and Treatment: Rehabilitation  THERAPY DIAG:  Muscle weakness (generalized)  Difficulty in walking, not elsewhere classified  Pain in right hip  ONSET DATE: 07/02/23 surgery date - chronic pain and weakness prior to surgery  SUBJECTIVE:                                                                                                                                                                                           SUBJECTIVE STATEMENT:   Pt reports no changes since last visit.   PAIN:   Are you having pain? yes NPRS scale: 1-2/10  Pain location: Rt hip   Pain orientation:  PAIN TYPE: ache  Pain description:  Aggravating factors: laying on Rt side, in/out of car, walking Relieving factors: sitting   PRECAUTIONS: Other: protocol from Dr. Parker scanned into media section of chart - avoid until 6 weeks post-op which is 08/13/23: passive ADDuction and ER, active ABDuction and IR  RED FLAGS: None   WEIGHT BEARING RESTRICTIONS: PWB initially, doctor just cleared to get rid of walking stick when ready  FALLS:  Has patient fallen in last 6 months? Yes. Number of falls 1, while carrying a planter and going up stairs  LIVING ENVIRONMENT: Lives with: lives with their spouse Lives in: House/apartment Stairs: Yes: External: 3 steps; on left going up Has following equipment at home: Single point cane  OCCUPATION: retired  PLOF: Independent  PATIENT GOALS: walk without pain and walking stick, can I get back to pickleball?  NEXT MD VISIT: 3 weeks  OBJECTIVE:  Note: Objective measures were completed at Evaluation unless otherwise noted.  DIAGNOSTIC FINDINGS:  Xray (3 views right hip): No femoral acetabular osteoarthritis   MRI (right  hip): There is an end-stage full-thickness tear of the gluteus medius with significant atrophy on T1  coronal view  PATIENT SURVEYS:  LEFS: 44/80 7/16: LEFS  42/80 9/4: 32/80 10/23: 27/80  COGNITION: Overall cognitive status: Within functional limits for tasks assessed     SENSATION: WFL  MUSCLE LENGTH:   POSTURE: flexed trunk   PALPATION: Mild tenderness around incision - steri strips placed today after stitches were removed  LUMBAR ROM:  WFL  LOWER EXTREMITY ROM:    Pt able to perform seated march A/ROM and quadruped rocking for hip flexion beyond 90 deg without pain  11/21/23 Right hip IR/ER full  LOWER EXTREMITY MMT:   Lt LE 4+/5 Rt hip at least 3+/5 - isometric testing only, resisted isometrics strong and painfree  11/21/23 Muscle testing using HD (LBS) R hip flex 38.6  abd 20.6 L hip flex 38.6  abd 31.6  MMT right hip extension 3+  Right hip abd 3 to 3+  FUNCTIONAL TESTS:  Eval: 5 times sit to stand: 13.23 Timed up and go (TUG): 12.24 using walking stick 2 MWT: 315 feet using single walking stick in Lt UE  08/01/2023: 6 minute walk test:  759 ft with [facility] 08/07/23: 6 minute walk test: 781' with [facility] 7/15: 5x STS with hands: 10.40 5x STS no hands 11.19 TUG: with cane: 9.29 sec  09/05/23: 6 MWT 1010 feet with SPC 5x STS no hands 9.94  11/21/23 5x STS from bench: 15.04 GAIT: Distance walked: 315' in 2 min  Assistive device utilized: single walking stick Level of assistance: Modified independence Comments: Rt early stance phase Trendelenburg  TREATMENT DATE: [facility] Adult PT Treatment:                                             Date: 12/09/23 Pt seen for aquatic therapy today.  Treatment took place in water 3.5-4.75 ft in depth at the [facility] pool. Temp of water was 91.  Pt entered/exited the pool via stairs in step-to, step-through pattern independently with bil rail.  - unsupported walking forward/ backward, side stepping with arm  add/abct with yellow hand floats - Rt runners step ups with light UE support on rail (up quick, down slow) - Rt lateral step ups with light UE support on wall (challenge) - UE on yellow hand floats: walking split squats 2 laps; hip abdct/addct crossing midline 2x10; Rt single leg heel raise x 10; single leg forward leans with yellow hand floats 2x5 each LE - SLS with solid noodle stomp (10 slow/10 quick) each LE  - straddling noodle without UE support:  cycling (cues for even LE use), suspended cross country ski and reverse jumping jacks     [facility] Adult PT Treatment:                                             Date: 12/06/23 Pt seen for aquatic therapy today.  Treatment took place in water 3.5-4.75 ft in depth at the [facility] pool. Temp of water was 91.  Pt entered/exited the pool via stairs in step-to, step-through pattern independently with bil rail.   - unsupported walking forward/ backward, cues for vertical trunk and even step length - side stepping into wide squat with arm add/abdct with yellow ->rainbow hand floats x 2 laps - UE on wall: split squats x 8  each LE -> walking split squats with UE On rainbow hand floats - plank with yellow hand floats with alternating hip extension 2 x 5 (difficulty maintaining balance) - squats pushing rainbow hand float under water x 10 - UE on yellow hand floats: single leg squats 2x 5; Rt heel raises x10; 3 way LE kick 2x10 RLE, 1x10 LLE - single leg forward leans with yellow hand floats x 10 RLE ,x 5 LLE  [facility] Adult PT Treatment:                                             Date: 12/03/23 Pt seen for aquatic therapy today.  Treatment took place in water 3.5-4.75 ft in depth at the [facility] pool. Temp of water was 91.  Pt entered/exited the pool via stairs in step-to, step-through pattern independently with bil rail.   - unsupported walking forward/ backward, cues for vertical trunk and even step length - side stepping with arm  add/abdct with yellow hand floats x 2 laps - UE on yellow hand floats: toe/heel raises x10; 2x10 leg swings into hip flex/extension and hip abdct/ add (2nd set quicker/smaller range) - noodle stomp with hollow noodle, 10 slow, 10 quick each LE, in hip flexion repeated with solid noodle into flexion and abdct (improved) - standing balance on solid noodle-> squats x 10 - side plank on bench in water with x 5 starfish each side (challenge on L side) - plank on bench in water with hip ext x 10 each LE; fire hydrants x 5 each - STS from 3rd step in water x10   [facility] Adult PT Treatment:                                             Date: 11/25/23 Pt seen for aquatic therapy today.  Treatment took place in water 3.5-4.75 ft in depth at the [facility] pool. Temp of water was 91.  Pt entered/exited the pool via stairs in step-to, step-through pattern independently with bil rail.  - Intro to aquatic therapy principles - unsupported walking forward/ backward, cues for vertical trunk - unsupported side stepping -> with arm add/abdct with rainbow hand floats - suitcase carry with bil/single rainbow hand float by side, marching forward/backward - UE on wall:  toe/heel raises x10; hip add/abdct x10 ; hip flexion /extension x10; relaxed squats x 3 - marching forward/backward - UE on rainbow hand floats: SLS x 20sec each; 3 way LE kick 2x5 in R SLS, 1x5 L SLS, cues for  - TrA set with long hollow noodle pull down to thighs 2x 5 in standard stance and staggered stance  Pt requires the buoyancy and hydrostatic pressure of water for support, and to offload joints by unweighting joint load by at least 50 % in navel deep water and by at least 75-80% in chest to neck deep water.  Viscosity of the water is needed for resistance of strengthening. Water current perturbations provides challenge to standing balance requiring increased core activation.     [facility] Adult PT Treatment:  DATE: 11/21/23 Re-assessment testing: balance, 5 x STS, strength   10/31/23 bike x 6 minutes- PT present to discuss progress  Supine blue band hip extension 20x  Prone glute squeeze 10x Prone over 1 pillow hip extension 10x right/left Side planks 10x right only Supine propped on wedge: hip thruster with pair of 5# weights on hips 2 x10 Sit to stand pair of 5# dumbbells Lateral lean to the left on the back of the UBE seat: left hip flexion (partially unloading right LE) Cable walk 10x backwards only with belt: 10x Hip machine 30# right only: hip abduction and extension 2 x10   10/29/23 Nu-Step  level 5 x 6 minutes- PT present to discuss progress  6 inch step ups  right leading 10x 2 hand support, lateral step up x10 on Rt  WB on right with step taps 10x using 8 steps Single leg stance on Rt with lunge taps to colored discs on floor-forward and lateral bil  Resisted walking -Side stepping: 10# x8 each with close CGA Leg press seat 6 85# bil 2x10; 45# single leg 2x10 right/left At the stairs 2nd step hip flexor, gastroc and quadratus lumborum muscle lengthening 3 sets of 5 right/left: Rocking forward and back with arm reach up and over Sidestepping lateral over hurdles x4 laps  10/24/23  Nu-Step  level 5 x 6 minutes- PT present to discuss progress  6 inch step ups  right leading 10x 2 hand support WB on right with step taps 10x  Side stepping with pink power cord 8x right/left Side wall plank left: with left hip flexion 2x10 Side wall plank to the left with left hip abduction 2x10 At the stairs 2nd step hip flexor, gastroc and quadratus lumborum muscle lengthening 3 sets of 5 right/left: Rocking forward and back with arm reach up and over 10/22/23 Bike: level 2x 6 minutes- PT present to discuss progress  Side step cable 5 # with belt 7x right/left At the stairs 2nd step hip flexor, gastroc and quadratus lumborum muscle lengthening 3 sets of 5 right/left: Rocking forward  and back with arm reach up and over 2 rounds 1 min rest break b/w rounds:   10 lateral sliders each side right/left 6 clockwise and counterclock slider circles right/left 10 single leg march with 5# weight in same side hand right/left   Side wall plank:with left hip flexion 10x needs UE support  Bridge with feet on 8 inch step 10x Supine with heel taps for core activation 2x10 Side bridge 8x right/left  PATIENT EDUCATION:  Education details: [email or web address] Person educated: Patient Education method: Explanation, Demonstration, Verbal cues, and Handouts Education comprehension: verbalized understanding and returned demonstration  HOME EXERCISE PROGRAM: Access Code: [email or web address] URL: [email or web address] Date: 07/12/2023 Prepared by: Orvil Beuhring  Exercises - Supine Posterior Pelvic Tilt  - 2 x daily - 7 x weekly - 2 sets - 10 reps - Supine Short Arc Quad  - 2 x daily - 7 x weekly - 2 sets - 10 reps - Supine Hip Adduction Isometric with Ball  - 2 x daily - 7 x weekly - 2 sets - 10 reps - 5 hold - Quadruped Rocking Backward  - 2 x daily - 7 x weekly - 1-2 sets - 10 reps - 5 hold - Seated Isometric Hip Abduction  - 2 x daily - 7 x weekly - 2 sets - 10 reps - 5 hold - Standing Knee Flexion AROM with Chair Support  - 2 x daily - 7 x  weekly - 2 sets - 10 reps  ASSESSMENT:  CLINICAL IMPRESSION: Ascending / descending stairs with RLE created good challenge if not using UE support on rails.  Pt reported good toleration of progression of aquatic exercises without increase in symptoms. Continued work on loading RLE in SLS exercises in pool with support of water. Will continue to progress as tolerated.   Goals are ongoing.    Re-cert: Pt had injection by Dr Genelle 11/20/23 and presents at aquatic therapy with additional orders for aquatic PT intervention to begin. Pt presents without SPC. Pt has had at least a 3 week hiatus from therapy. Testing completed.  Results variable.  LEFS  has declined steadily since evaluation with her lowest score today.  Likely do to her long duration of care and more exaggerated perception of her lack of ability. Strength testing partly completed with hand dynamometer.  She has obvious deficits in right hip abduction. Gait is with a marked trendelenburg. Pt is encouraged to use SPC when amb to aquatics due to distance and gross limp to improve gait and reduce LBP which she reports is chronic.  She reports she will be leaving for [place]  dec 23 for the remainder of the winter. Plan to progress aquatics for the next few week then will assess returning to land. She is a good candidate for aquatic intervention and will benefit from the properties of water to progress towards functional goals.      OBJECTIVE IMPAIRMENTS: Abnormal gait, decreased balance, decreased coordination, difficulty walking, decreased strength, increased edema, impaired flexibility, improper body mechanics, postural dysfunction, and pain.   ACTIVITY LIMITATIONS: lifting, bending, squatting, stairs, and locomotion level  PARTICIPATION LIMITATIONS: cleaning, laundry, shopping, community activity, and yard work  PERSONAL FACTORS: Age are also affecting patient's functional outcome.   REHAB POTENTIAL: Excellent  CLINICAL DECISION MAKING: Evolving/moderate complexity  EVALUATION COMPLEXITY: Moderate   GOALS: Goals reviewed with patient? Yes  SHORT TERM GOALS: Target date: 08/09/23  Pt will be ind with initial HEP Baseline: Goal status: Met on 08/01/23  2.  Pt will be educated on movements to avoid to follow post-op protocol to protect tendon transfer site Baseline:  Goal status: MET  3.  Pt will demo improved Rt hip control in stance phase of gait with single walking stick as needed. Baseline:  Goal status: MET 08/07/23    LONG TERM GOALS: Target date: 01/17/2024   Pt will be ind with advanced HEP Baseline:  Goal status: ongoing  2.  Pt will improve LEFS score  to at least 55/80 to demo improved function Baseline: 44/80 Goal status: ongoing  3.  Pt will perform 6 min walk test covering at least 700' with walking stick as needed Baseline:  Goal status: MET 08/07/23 with walking stick  4.  Pt will be able to demo squat, lift 10lb, carry 10lb and walk to mimic household chores and yard activities with proper body mechanics without pain. Baseline:  Goal status: ongoing  5.  Pt will improve glut strength to at least 4+/5 to improve gait mechanics, stairs and transfers. Baseline:  Goal status: Progressing   6.  5x STS improved to 11 sec or less to demo improved functional strength and reduce fall risk. Baseline: 13.23 Goal status: ongoing    7. Pt will tolerate walking to and from setting and engaging in aquatic therapy session without excessive fatigue or increase in pain to demonstrate improved toleration to activity.  Baseline:  Goal Status: NEW   8. Pt will  tolerate stair climbing using alternating pattern ascending and descending 6 steps with or without use of handrail   Baseline:  Goal Status: NEW   9. Pt will improve strength in right hip abd by at least 5lbs (using HD) to reduce trendelenburg gait pattern and improve function  Baseline: marked pattern  Goal Status: NEW  PLAN:  PT FREQUENCY: 2x/week  PT DURATION: 8 weeks  PLANNED INTERVENTIONS: 97110-Therapeutic exercises, 97530- Therapeutic activity, [id number]- Neuromuscular re-education, 97535- Self Care, [id number]- Manual therapy, [phone]- Gait training, Patient/Family education, Balance training, Stair training, Cryotherapy, and Moist heat.  PLAN FOR NEXT SESSION: assess response to  aquatic therapy, stairs in water   [place], [place] 12/09/23 11:42 AM [facility] [address] [place], [place], [address] Phone: [phone]   Fax:  [phone]

## 2023-12-11 ENCOUNTER — Ambulatory Visit (HOSPITAL_BASED_OUTPATIENT_CLINIC_OR_DEPARTMENT_OTHER): Payer: Self-pay | Admitting: Physical Therapy

## 2023-12-16 ENCOUNTER — Ambulatory Visit (HOSPITAL_BASED_OUTPATIENT_CLINIC_OR_DEPARTMENT_OTHER): Payer: Self-pay | Admitting: Physical Therapy

## 2023-12-16 DIAGNOSIS — R2689 Other abnormalities of gait and mobility: Secondary | ICD-10-CM

## 2023-12-16 DIAGNOSIS — M6281 Muscle weakness (generalized): Secondary | ICD-10-CM

## 2023-12-16 DIAGNOSIS — R262 Difficulty in walking, not elsewhere classified: Secondary | ICD-10-CM

## 2023-12-16 DIAGNOSIS — M25551 Pain in right hip: Secondary | ICD-10-CM

## 2023-12-16 NOTE — Therapy (Signed)
 OUTPATIENT PHYSICAL THERAPY TREATMENT    Patient Name: Rebecca Long MRN: [id number] DOB:17-Jan-1951, 73 y.o., female Today's Date: 12/16/2023   END OF SESSION:  PT End of Session - 12/16/23 1751     Visit Number 35    Number of Visits 45    Date for Recertification  01/17/24    Authorization Type HTA no auth required    Progress Note Due on Visit 39    PT Start Time 1525   pt arrived late   PT Stop Time 1558    PT Time Calculation (min) 33 min    Activity Tolerance Patient tolerated treatment well    Behavior During Therapy WFL for tasks assessed/performed            Past Medical History:  Diagnosis Date   Anxiety    Cavus foot, acquired 09/09/2012    bilateral cavus feet the one on the left shows that she has some probable neurogenic weakness with abnormal curvature and in inversion contracture Podiatry 06/2015: H&P and x-ray reviewed with patient. Today I went ahead and I did a proximal nerve block I was able to aspirate the second MPJ and got out amount of clear fluid and injected with a quarter cc dexamethasone  Kenalog  and ap   DDD (degenerative disc disease), lumbar    Depression    Endometrial polyp    GERD (gastroesophageal reflux disease)    Hiatal hernia    Hip flexor tendinitis, right 07/02/2016   History of adenomatous polyp of colon    History of esophageal stricture    History of gastric polyp 11/2017   History of PCR DNA positive for HSV2 11/12/2016   History of primary hyperparathyroidism    s/p  left parathryoidectomy 09-17-2008-- resolved   Hyperlipidemia    Hypertension    Insomnia    Lumbar stenosis    Osteopenia    PONV (postoperative nausea and vomiting)    Schatzki's ring    Scoliosis    Tendinopathy of right biceps tendon 07/02/2016   Thickened endometrium    Uterine fibroid    UTI (urinary tract infection) due to Enterococcus 01/05/2020   Wears glasses    Past Surgical History:  Procedure Laterality Date   BREAST REDUCTION SURGERY  Bilateral 2000   COLONOSCOPY     CYST EXCISION  1985   head and rt axilla   DILATATION & CURETTAGE/HYSTEROSCOPY WITH MYOSURE N/A 08/13/2018   Procedure: DILATATION & CURETTAGE/HYSTEROSCOPY WITH MYOSURE;  Surgeon: Lavoie, Marie-Lyne, MD;  Location: [facility];  Service: Gynecology;  Laterality: N/A;  requests 10:15am in [place] Gyn block request 30 minutes   DILATION AND CURETTAGE OF UTERUS N/A 08/13/2018   ECTOPIC PREGNANCY SURGERY  1981   OPEN SURGICAL REPAIR OF GLUTEAL TENDON Right 07/02/2023   Procedure: REPAIR, TENDON, GLUTEUS MEDIUS, OPEN;  Surgeon: Genelle Standing, MD;  Location: [facility];  Service: Orthopedics;  Laterality: Right;  RIGHT GLUTEUS MAXIMUS TENDON TRANSFER WITH COLLAGEN PATCH AUGMENTATION   PARATHYROIDECTOMY Left 09/17/2008   dr jeoffrey dawn  @[facility]    left superior  (adenoma)   REDUCTION MAMMAPLASTY Bilateral 2000   years ago   SHOULDER SURGERY Right 2007   frozen   UPPER GASTROINTESTINAL ENDOSCOPY  last one 12-18-2017   dr wilhelmenia   Patient Active Problem List   Diagnosis Date Noted   Tendinopathy of gluteus medius 07/02/2023   Greater trochanteric bursitis of right hip 04/17/2022   C. difficile diarrhea 03/08/2022   Arthritis of right sacroiliac  joint 02/26/2022   Encounter for monitoring long-term proton pump inhibitor therapy 07/05/2021   Hx of colonic polyps 06/06/2021   Incontinence of feces with fecal urgency 06/06/2021   Sleep disorder 11/29/2020   Benign paroxysmal positional vertigo of right ear 08/30/2020   AC (acromioclavicular) arthritis 10/22/2019   Adhesive bursitis of left shoulder 07/22/2019   GERD with esophagitis 12/30/2017   Hyperlipidemia 05/15/2017   Morbid obesity (HCC) 11/12/2016   Essential hypertension, benign 10/06/2014   Depression with anxiety 10/06/2014   Stress incontinence 10/06/2014   Osteoporosis 10/06/2014   Degenerative disc disease, lumbar 09/09/2012   Spinal stenosis of lumbar region  09/09/2012    PCP: Charlies Bellini, DO  REFERRING PROVIDER: Elspeth Parker, MD  REFERRING DIAG: post-op REPAIR, TENDON, GLUTEUS MEDIUS, OPEN (Right) - RIGHT GLUTEUS MAXIMUS TENDON TRANSFER WITH COLLAGEN PATCH AUGMENTATION  Rationale for Evaluation and Treatment: Rehabilitation  THERAPY DIAG:  Muscle weakness (generalized)  Difficulty in walking, not elsewhere classified  Pain in right hip  Other abnormalities of gait and mobility  ONSET DATE: 07/02/23 surgery date - chronic pain and weakness prior to surgery  SUBJECTIVE:                                                                                                                                                                                           SUBJECTIVE STATEMENT: You've finally done it. You made me sore.   Pt reports she had soreness in Rt hip for 2 days following last session.   PAIN:   Are you having pain? no NPRS scale: 0 Pain location: Rt hip   Pain orientation:  PAIN TYPE: ache  Pain description:  Aggravating factors: laying on Rt side, in/out of car, walking Relieving factors: sitting   PRECAUTIONS: Other: protocol from Dr. Parker scanned into media section of chart - avoid until 6 weeks post-op which is 08/13/23: passive ADDuction and ER, active ABDuction and IR  RED FLAGS: None   WEIGHT BEARING RESTRICTIONS: PWB initially, doctor just cleared to get rid of walking stick when ready  FALLS:  Has patient fallen in last 6 months? Yes. Number of falls 1, while carrying a planter and going up stairs  LIVING ENVIRONMENT: Lives with: lives with their spouse Lives in: House/apartment Stairs: Yes: External: 3 steps; on left going up Has following equipment at home: Single point cane  OCCUPATION: retired  PLOF: Independent  PATIENT GOALS: walk without pain and walking stick, can I get back to pickleball?  NEXT MD VISIT: 3 weeks  OBJECTIVE:  Note: Objective measures were completed at Evaluation  unless otherwise noted.  DIAGNOSTIC FINDINGS:  Xray (3 views  right hip): No femoral acetabular osteoarthritis   MRI (right hip): There is an end-stage full-thickness tear of the gluteus medius with significant atrophy on T1 coronal view  PATIENT SURVEYS:  LEFS: 44/80 7/16: LEFS  42/80 9/4: 32/80 10/23: 27/80  COGNITION: Overall cognitive status: Within functional limits for tasks assessed     SENSATION: WFL  MUSCLE LENGTH:   POSTURE: flexed trunk   PALPATION: Mild tenderness around incision - steri strips placed today after stitches were removed  LUMBAR ROM:  WFL  LOWER EXTREMITY ROM:    Pt able to perform seated march A/ROM and quadruped rocking for hip flexion beyond 90 deg without pain  11/21/23 Right hip IR/ER full  LOWER EXTREMITY MMT:   Lt LE 4+/5 Rt hip at least 3+/5 - isometric testing only, resisted isometrics strong and painfree  11/21/23 Muscle testing using HD (LBS) R hip flex 38.6  abd 20.6 L hip flex 38.6  abd 31.6  MMT right hip extension 3+  Right hip abd 3 to 3+  FUNCTIONAL TESTS:  Eval: 5 times sit to stand: 13.23 Timed up and go (TUG): 12.24 using walking stick 2 MWT: 315 feet using single walking stick in Lt UE  08/01/2023: 6 minute walk test:  759 ft with [facility] 08/07/23: 6 minute walk test: 781' with [facility] 7/15: 5x STS with hands: 10.40 5x STS no hands 11.19 TUG: with cane: 9.29 sec  09/05/23: 6 MWT 1010 feet with SPC 5x STS no hands 9.94  11/21/23 5x STS from bench: 15.04 GAIT: Distance walked: 315' in 2 min  Assistive device utilized: single walking stick Level of assistance: Modified independence Comments: Rt early stance phase Trendelenburg  TREATMENT DATE: [facility] Adult PT Treatment:                                             Date: 12/16/23 Pt seen for aquatic therapy today.  Treatment took place in water 3.5-4.75 ft in depth at the [facility] pool. Temp of water was 91.  Pt entered/exited the pool via stairs in  step-to, step-through pattern independently with bil rail.  - unsupported walking forward/ backward, side stepping with arm add/abct with yellow hand floats - Rt runners step ups with light UE support on rail (up quick, down slow) - Rt/Lt lateral step ups with light UE support on wall (improved!) - UE On wall:  split squats x 10 each LE -> walking split squats 2 laps with yellow hand floats - first lap forward/ 2nd lap backwards - UE on yellow hand floats: ; hip abdct/addct crossing midline 2x10; Rt single leg heel raise x 10; single leg forward leans with yellow hand floats x 10 in Rt SLS - straddling noodle without UE support:  cycling (cues for even LE use), suspended cross country ski and reverse jumping jacks   [facility] Adult PT Treatment:                                             Date: 12/09/23 Pt seen for aquatic therapy today.  Treatment took place in water 3.5-4.75 ft in depth at the [facility] pool. Temp of water was 91.  Pt entered/exited the pool via stairs in step-to, step-through pattern independently with bil rail.  -  unsupported walking forward/ backward, side stepping with arm add/abct with yellow hand floats - Rt runners step ups with light UE support on rail (up quick, down slow) - Rt lateral step ups with light UE support on wall (challenge) - UE on yellow hand floats: walking split squats 2 laps; hip abdct/addct crossing midline 2x10; Rt single leg heel raise x 10; single leg forward leans with yellow hand floats 2x5 each LE - SLS with solid noodle stomp (10 slow/10 quick) each LE  - straddling noodle without UE support:  cycling (cues for even LE use), suspended cross country ski and reverse jumping jacks     [facility] Adult PT Treatment:                                             Date: 12/06/23 Pt seen for aquatic therapy today.  Treatment took place in water 3.5-4.75 ft in depth at the [facility] pool. Temp of water was 91.  Pt entered/exited the pool via  stairs in step-to, step-through pattern independently with bil rail.   - unsupported walking forward/ backward, cues for vertical trunk and even step length - side stepping into wide squat with arm add/abdct with yellow ->rainbow hand floats x 2 laps - UE on wall: split squats x 8 each LE -> walking split squats with UE On rainbow hand floats - plank with yellow hand floats with alternating hip extension 2 x 5 (difficulty maintaining balance) - squats pushing rainbow hand float under water x 10 - UE on yellow hand floats: single leg squats 2x 5; Rt heel raises x10; 3 way LE kick 2x10 RLE, 1x10 LLE - single leg forward leans with yellow hand floats x 10 RLE ,x 5 LLE  [facility] Adult PT Treatment:                                             Date: 12/03/23 Pt seen for aquatic therapy today.  Treatment took place in water 3.5-4.75 ft in depth at the [facility] pool. Temp of water was 91.  Pt entered/exited the pool via stairs in step-to, step-through pattern independently with bil rail.   - unsupported walking forward/ backward, cues for vertical trunk and even step length - side stepping with arm add/abdct with yellow hand floats x 2 laps - UE on yellow hand floats: toe/heel raises x10; 2x10 leg swings into hip flex/extension and hip abdct/ add (2nd set quicker/smaller range) - noodle stomp with hollow noodle, 10 slow, 10 quick each LE, in hip flexion repeated with solid noodle into flexion and abdct (improved) - standing balance on solid noodle-> squats x 10 - side plank on bench in water with x 5 starfish each side (challenge on L side) - plank on bench in water with hip ext x 10 each LE; fire hydrants x 5 each - STS from 3rd step in water x10   [facility] Adult PT Treatment:                                             Date: 11/25/23 Pt seen for aquatic therapy today.  Treatment took place  in water 3.5-4.75 ft in depth at the [facility] pool. Temp of water was 91.  Pt  entered/exited the pool via stairs in step-to, step-through pattern independently with bil rail.  - Intro to aquatic therapy principles - unsupported walking forward/ backward, cues for vertical trunk - unsupported side stepping -> with arm add/abdct with rainbow hand floats - suitcase carry with bil/single rainbow hand float by side, marching forward/backward - UE on wall:  toe/heel raises x10; hip add/abdct x10 ; hip flexion /extension x10; relaxed squats x 3 - marching forward/backward - UE on rainbow hand floats: SLS x 20sec each; 3 way LE kick 2x5 in R SLS, 1x5 L SLS, cues for  - TrA set with long hollow noodle pull down to thighs 2x 5 in standard stance and staggered stance  Pt requires the buoyancy and hydrostatic pressure of water for support, and to offload joints by unweighting joint load by at least 50 % in navel deep water and by at least 75-80% in chest to neck deep water.  Viscosity of the water is needed for resistance of strengthening. Water current perturbations provides challenge to standing balance requiring increased core activation.     [facility] Adult PT Treatment:                                                DATE: 11/21/23 Re-assessment testing: balance, 5 x STS, strength   10/31/23 bike x 6 minutes- PT present to discuss progress  Supine blue band hip extension 20x  Prone glute squeeze 10x Prone over 1 pillow hip extension 10x right/left Side planks 10x right only Supine propped on wedge: hip thruster with pair of 5# weights on hips 2 x10 Sit to stand pair of 5# dumbbells Lateral lean to the left on the back of the UBE seat: left hip flexion (partially unloading right LE) Cable walk 10x backwards only with belt: 10x Hip machine 30# right only: hip abduction and extension 2 x10   10/29/23 Nu-Step  level 5 x 6 minutes- PT present to discuss progress  6 inch step ups  right leading 10x 2 hand support, lateral step up x10 on Rt  WB on right with step taps 10x using 8  steps Single leg stance on Rt with lunge taps to colored discs on floor-forward and lateral bil  Resisted walking -Side stepping: 10# x8 each with close CGA Leg press seat 6 85# bil 2x10; 45# single leg 2x10 right/left At the stairs 2nd step hip flexor, gastroc and quadratus lumborum muscle lengthening 3 sets of 5 right/left: Rocking forward and back with arm reach up and over Sidestepping lateral over hurdles x4 laps  10/24/23  Nu-Step  level 5 x 6 minutes- PT present to discuss progress  6 inch step ups  right leading 10x 2 hand support WB on right with step taps 10x  Side stepping with pink power cord 8x right/left Side wall plank left: with left hip flexion 2x10 Side wall plank to the left with left hip abduction 2x10 At the stairs 2nd step hip flexor, gastroc and quadratus lumborum muscle lengthening 3 sets of 5 right/left: Rocking forward and back with arm reach up and over 10/22/23 Bike: level 2x 6 minutes- PT present to discuss progress  Side step cable 5 # with belt 7x right/left At the stairs 2nd step hip flexor, gastroc  and quadratus lumborum muscle lengthening 3 sets of 5 right/left: Rocking forward and back with arm reach up and over 2 rounds 1 min rest break b/w rounds:   10 lateral sliders each side right/left 6 clockwise and counterclock slider circles right/left 10 single leg march with 5# weight in same side hand right/left   Side wall plank:with left hip flexion 10x needs UE support  Bridge with feet on 8 inch step 10x Supine with heel taps for core activation 2x10 Side bridge 8x right/left  PATIENT EDUCATION:  Education details: [email or web address] Person educated: Patient Education method: Explanation, Demonstration, Verbal cues, and Handouts Education comprehension: verbalized understanding and returned demonstration  HOME EXERCISE PROGRAM: Access Code: [email or web address] URL: [email or web address] Date: 07/12/2023 Prepared by: Orvil Beuhring  Exercises -  Supine Posterior Pelvic Tilt  - 2 x daily - 7 x weekly - 2 sets - 10 reps - Supine Short Arc Quad  - 2 x daily - 7 x weekly - 2 sets - 10 reps - Supine Hip Adduction Isometric with Ball  - 2 x daily - 7 x weekly - 2 sets - 10 reps - 5 hold - Quadruped Rocking Backward  - 2 x daily - 7 x weekly - 1-2 sets - 10 reps - 5 hold - Seated Isometric Hip Abduction  - 2 x daily - 7 x weekly - 2 sets - 10 reps - 5 hold - Standing Knee Flexion AROM with Chair Support  - 2 x daily - 7 x weekly - 2 sets - 10 reps  ASSESSMENT:  CLINICAL IMPRESSION: Ascending / descending stairs with RLE slightly improved from last session.  Pt reported good toleration of progression of aquatic exercises without increase in symptoms. Continued work on loading RLE in SLS exercises in pool with support of water. Session shortened due to late arrival. Will continue to progress as tolerated.   Goals are ongoing.   Pt to return to land therapist by visit #39 for progress note.    Re-cert: Pt had injection by Dr Genelle 11/20/23 and presents at aquatic therapy with additional orders for aquatic PT intervention to begin. Pt presents without SPC. Pt has had at least a 3 week hiatus from therapy. Testing completed.  Results variable.  LEFS has declined steadily since evaluation with her lowest score today.  Likely do to her long duration of care and more exaggerated perception of her lack of ability. Strength testing partly completed with hand dynamometer.  She has obvious deficits in right hip abduction. Gait is with a marked trendelenburg. Pt is encouraged to use SPC when amb to aquatics due to distance and gross limp to improve gait and reduce LBP which she reports is chronic.  She reports she will be leaving for [place]  dec 23 for the remainder of the winter. Plan to progress aquatics for the next few week then will assess returning to land. She is a good candidate for aquatic intervention and will benefit from the properties of water to  progress towards functional goals.      OBJECTIVE IMPAIRMENTS: Abnormal gait, decreased balance, decreased coordination, difficulty walking, decreased strength, increased edema, impaired flexibility, improper body mechanics, postural dysfunction, and pain.   ACTIVITY LIMITATIONS: lifting, bending, squatting, stairs, and locomotion level  PARTICIPATION LIMITATIONS: cleaning, laundry, shopping, community activity, and yard work  PERSONAL FACTORS: Age are also affecting patient's functional outcome.   REHAB POTENTIAL: Excellent  CLINICAL DECISION MAKING: Evolving/moderate complexity  EVALUATION COMPLEXITY: Moderate   GOALS: Goals reviewed with patient? Yes  SHORT TERM GOALS: Target date: 08/09/23  Pt will be ind with initial HEP Baseline: Goal status: Met on 08/01/23  2.  Pt will be educated on movements to avoid to follow post-op protocol to protect tendon transfer site Baseline:  Goal status: MET  3.  Pt will demo improved Rt hip control in stance phase of gait with single walking stick as needed. Baseline:  Goal status: MET 08/07/23    LONG TERM GOALS: Target date: 01/17/2024   Pt will be ind with advanced HEP Baseline:  Goal status: ongoing  2.  Pt will improve LEFS score to at least 55/80 to demo improved function Baseline: 44/80 Goal status: ongoing  3.  Pt will perform 6 min walk test covering at least 700' with walking stick as needed Baseline:  Goal status: MET 08/07/23 with walking stick  4.  Pt will be able to demo squat, lift 10lb, carry 10lb and walk to mimic household chores and yard activities with proper body mechanics without pain. Baseline:  Goal status: ongoing  5.  Pt will improve glut strength to at least 4+/5 to improve gait mechanics, stairs and transfers. Baseline:  Goal status: Progressing   6.  5x STS improved to 11 sec or less to demo improved functional strength and reduce fall risk. Baseline: 13.23 Goal status: ongoing    7. Pt  will tolerate walking to and from setting and engaging in aquatic therapy session without excessive fatigue or increase in pain to demonstrate improved toleration to activity.  Baseline:  Goal Status: NEW   8. Pt will tolerate stair climbing using alternating pattern ascending and descending 6 steps with or without use of handrail   Baseline:  Goal Status: NEW   9. Pt will improve strength in right hip abd by at least 5lbs (using HD) to reduce trendelenburg gait pattern and improve function  Baseline: marked pattern  Goal Status: NEW  PLAN:  PT FREQUENCY: 2x/week  PT DURATION: 8 weeks  PLANNED INTERVENTIONS: 97110-Therapeutic exercises, 97530- Therapeutic activity, [id number]- Neuromuscular re-education, 97535- Self Care, [id number]- Manual therapy, [phone]- Gait training, Patient/Family education, Balance training, Stair training, Cryotherapy, and Moist heat.  PLAN FOR NEXT SESSION: assess response to  aquatic therapy, stairs in water  [place], [place] 12/16/23 5:56 PM [facility] [address] [place], [place], [address] Phone: [phone]   Fax:  [phone]

## 2023-12-18 ENCOUNTER — Encounter (HOSPITAL_BASED_OUTPATIENT_CLINIC_OR_DEPARTMENT_OTHER): Payer: Self-pay | Admitting: Physical Therapy

## 2023-12-18 ENCOUNTER — Ambulatory Visit (HOSPITAL_BASED_OUTPATIENT_CLINIC_OR_DEPARTMENT_OTHER): Payer: Self-pay | Admitting: Physical Therapy

## 2023-12-18 DIAGNOSIS — M6281 Muscle weakness (generalized): Secondary | ICD-10-CM

## 2023-12-18 DIAGNOSIS — R262 Difficulty in walking, not elsewhere classified: Secondary | ICD-10-CM

## 2023-12-18 DIAGNOSIS — M25551 Pain in right hip: Secondary | ICD-10-CM

## 2023-12-18 NOTE — Therapy (Signed)
 OUTPATIENT PHYSICAL THERAPY TREATMENT    Patient Name: Rebecca Long MRN: [id number] DOB:09/27/1950, 73 y.o., female Today's Date: 12/18/2023   END OF SESSION:  PT End of Session - 12/18/23 0940     Visit Number 36    Number of Visits 45    Date for Recertification  01/17/24    Authorization Type HTA no auth required    Progress Note Due on Visit 39    PT Start Time 0935    PT Stop Time 1014    PT Time Calculation (min) 39 min    Activity Tolerance Patient tolerated treatment well    Behavior During Therapy WFL for tasks assessed/performed             Past Medical History:  Diagnosis Date   Anxiety    Cavus foot, acquired 09/09/2012    bilateral cavus feet the one on the left shows that she has some probable neurogenic weakness with abnormal curvature and in inversion contracture Podiatry 06/2015: H&P and x-ray reviewed with patient. Today I went ahead and I did a proximal nerve block I was able to aspirate the second MPJ and got out amount of clear fluid and injected with a quarter cc dexamethasone  Kenalog  and ap   DDD (degenerative disc disease), lumbar    Depression    Endometrial polyp    GERD (gastroesophageal reflux disease)    Hiatal hernia    Hip flexor tendinitis, right 07/02/2016   History of adenomatous polyp of colon    History of esophageal stricture    History of gastric polyp 11/2017   History of PCR DNA positive for HSV2 11/12/2016   History of primary hyperparathyroidism    s/p  left parathryoidectomy 09-17-2008-- resolved   Hyperlipidemia    Hypertension    Insomnia    Lumbar stenosis    Osteopenia    PONV (postoperative nausea and vomiting)    Schatzki's ring    Scoliosis    Tendinopathy of right biceps tendon 07/02/2016   Thickened endometrium    Uterine fibroid    UTI (urinary tract infection) due to Enterococcus 01/05/2020   Wears glasses    Past Surgical History:  Procedure Laterality Date   BREAST REDUCTION SURGERY Bilateral 2000    COLONOSCOPY     CYST EXCISION  1985   head and rt axilla   DILATATION & CURETTAGE/HYSTEROSCOPY WITH MYOSURE N/A 08/13/2018   Procedure: DILATATION & CURETTAGE/HYSTEROSCOPY WITH MYOSURE;  Surgeon: Lavoie, Marie-Lyne, MD;  Location: [facility];  Service: Gynecology;  Laterality: N/A;  requests 10:15am in [place] Gyn block request 30 minutes   DILATION AND CURETTAGE OF UTERUS N/A 08/13/2018   ECTOPIC PREGNANCY SURGERY  1981   OPEN SURGICAL REPAIR OF GLUTEAL TENDON Right 07/02/2023   Procedure: REPAIR, TENDON, GLUTEUS MEDIUS, OPEN;  Surgeon: Genelle Standing, MD;  Location: [facility];  Service: Orthopedics;  Laterality: Right;  RIGHT GLUTEUS MAXIMUS TENDON TRANSFER WITH COLLAGEN PATCH AUGMENTATION   PARATHYROIDECTOMY Left 09/17/2008   dr jeoffrey dawn  @[facility]    left superior  (adenoma)   REDUCTION MAMMAPLASTY Bilateral 2000   years ago   SHOULDER SURGERY Right 2007   frozen   UPPER GASTROINTESTINAL ENDOSCOPY  last one 12-18-2017   dr wilhelmenia   Patient Active Problem List   Diagnosis Date Noted   Tendinopathy of gluteus medius 07/02/2023   Greater trochanteric bursitis of right hip 04/17/2022   C. difficile diarrhea 03/08/2022   Arthritis of right sacroiliac joint 02/26/2022  Encounter for monitoring long-term proton pump inhibitor therapy 07/05/2021   Hx of colonic polyps 06/06/2021   Incontinence of feces with fecal urgency 06/06/2021   Sleep disorder 11/29/2020   Benign paroxysmal positional vertigo of right ear 08/30/2020   AC (acromioclavicular) arthritis 10/22/2019   Adhesive bursitis of left shoulder 07/22/2019   GERD with esophagitis 12/30/2017   Hyperlipidemia 05/15/2017   Morbid obesity (HCC) 11/12/2016   Essential hypertension, benign 10/06/2014   Depression with anxiety 10/06/2014   Stress incontinence 10/06/2014   Osteoporosis 10/06/2014   Degenerative disc disease, lumbar 09/09/2012   Spinal stenosis of lumbar region 09/09/2012     PCP: Charlies Bellini, DO  REFERRING PROVIDER: Elspeth Parker, MD  REFERRING DIAG: post-op REPAIR, TENDON, GLUTEUS MEDIUS, OPEN (Right) - RIGHT GLUTEUS MAXIMUS TENDON TRANSFER WITH COLLAGEN PATCH AUGMENTATION  Rationale for Evaluation and Treatment: Rehabilitation  THERAPY DIAG:  Muscle weakness (generalized)  Difficulty in walking, not elsewhere classified  Pain in right hip  ONSET DATE: 07/02/23 surgery date - chronic pain and weakness prior to surgery  SUBJECTIVE:                                                                                                                                                                                           SUBJECTIVE STATEMENT: No pain after last treatment   PAIN:   Are you having pain? no NPRS scale: 0 Pain location: Rt hip   Pain orientation:  PAIN TYPE: ache  Pain description:  Aggravating factors: laying on Rt side, in/out of car, walking Relieving factors: sitting   PRECAUTIONS: Other: protocol from Dr. Parker scanned into media section of chart - avoid until 6 weeks post-op which is 08/13/23: passive ADDuction and ER, active ABDuction and IR  RED FLAGS: None   WEIGHT BEARING RESTRICTIONS: PWB initially, doctor just cleared to get rid of walking stick when ready  FALLS:  Has patient fallen in last 6 months? Yes. Number of falls 1, while carrying a planter and going up stairs  LIVING ENVIRONMENT: Lives with: lives with their spouse Lives in: House/apartment Stairs: Yes: External: 3 steps; on left going up Has following equipment at home: Single point cane  OCCUPATION: retired  PLOF: Independent  PATIENT GOALS: walk without pain and walking stick, can I get back to pickleball?  NEXT MD VISIT: 3 weeks  OBJECTIVE:  Note: Objective measures were completed at Evaluation unless otherwise noted.  DIAGNOSTIC FINDINGS:  Xray (3 views right hip): No femoral acetabular osteoarthritis   MRI (right hip): There is an  end-stage full-thickness tear of the gluteus medius with significant atrophy on T1 coronal view  PATIENT  SURVEYS:  LEFS: 44/80 7/16: LEFS  42/80 9/4: 32/80 10/23: 27/80  COGNITION: Overall cognitive status: Within functional limits for tasks assessed     SENSATION: WFL  MUSCLE LENGTH:   POSTURE: flexed trunk   PALPATION: Mild tenderness around incision - steri strips placed today after stitches were removed  LUMBAR ROM:  WFL  LOWER EXTREMITY ROM:    Pt able to perform seated march A/ROM and quadruped rocking for hip flexion beyond 90 deg without pain  11/21/23 Right hip IR/ER full  LOWER EXTREMITY MMT:   Lt LE 4+/5 Rt hip at least 3+/5 - isometric testing only, resisted isometrics strong and painfree  11/21/23 Muscle testing using HD (LBS) R hip flex 38.6  abd 20.6 L hip flex 38.6  abd 31.6  MMT right hip extension 3+  Right hip abd 3 to 3+  FUNCTIONAL TESTS:  Eval: 5 times sit to stand: 13.23 Timed up and go (TUG): 12.24 using walking stick 2 MWT: 315 feet using single walking stick in Lt UE  08/01/2023: 6 minute walk test:  759 ft with [facility] 08/07/23: 6 minute walk test: 781' with [facility] 7/15: 5x STS with hands: 10.40 5x STS no hands 11.19 TUG: with cane: 9.29 sec  09/05/23: 6 MWT 1010 feet with SPC 5x STS no hands 9.94  11/21/23 5x STS from bench: 15.04 GAIT: Distance walked: 315' in 2 min  Assistive device utilized: single walking stick Level of assistance: Modified independence Comments: Rt early stance phase Trendelenburg  TREATMENT DATE: [facility] Adult PT Treatment:                                             Date: 12/18/23 Pt seen for aquatic therapy today.  Treatment took place in water 3.5-4.75 ft in depth at the [facility] pool. Temp of water was 91.  Pt entered/exited the pool via stairs in step-to, step-through pattern independently with bil rail.  - unsupported walking forward/ backward, side stepping with arm add/abct with yellow  hand floats - Rt runners step ups with light UE support on rail (up quick, down slow) - Rt/Lt lateral step ups with light UE support on wall (good coordination and muscle control) -STS from 3rd from bottom step x 10->repeated from 4th from bottom step. Cues for execution/proper glute engagement -hip hiking bottom of pool R/L 2 x 8. Cues and demonstration for execution -resisted hip extension using rider band 2 x 10.  VC and demonstration for execution  - straddling noodle without UE support:  cycling (cues for even LE use), suspended cross country ski and reverse jumping jacks   [facility] Adult PT Treatment:                                             Date: 12/16/23 Pt seen for aquatic therapy today.  Treatment took place in water 3.5-4.75 ft in depth at the [facility] pool. Temp of water was 91.  Pt entered/exited the pool via stairs in step-to, step-through pattern independently with bil rail.  - unsupported walking forward/ backward, side stepping with arm add/abct with yellow hand floats - Rt runners step ups with light UE support on rail (up quick, down slow) - Rt/Lt lateral step ups with light UE support on  wall (improved!) - UE On wall:  split squats x 10 each LE -> walking split squats 2 laps with yellow hand floats - first lap forward/ 2nd lap backwards - UE on yellow hand floats: ; hip abdct/addct crossing midline 2x10; Rt single leg heel raise x 10; single leg forward leans with yellow hand floats x 10 in Rt SLS - straddling noodle without UE support:  cycling (cues for even LE use), suspended cross country ski and reverse jumping jacks   [facility] Adult PT Treatment:                                             Date: 12/09/23 Pt seen for aquatic therapy today.  Treatment took place in water 3.5-4.75 ft in depth at the [facility] pool. Temp of water was 91.  Pt entered/exited the pool via stairs in step-to, step-through pattern independently with bil rail.  - unsupported  walking forward/ backward, side stepping with arm add/abct with yellow hand floats - Rt runners step ups with light UE support on rail (up quick, down slow) - Rt lateral step ups with light UE support on wall (challenge) - UE on yellow hand floats: walking split squats 2 laps; hip abdct/addct crossing midline 2x10; Rt single leg heel raise x 10; single leg forward leans with yellow hand floats 2x5 each LE - SLS with solid noodle stomp (10 slow/10 quick) each LE  - straddling noodle without UE support:  cycling (cues for even LE use), suspended cross country ski and reverse jumping jacks     [facility] Adult PT Treatment:                                             Date: 12/06/23 Pt seen for aquatic therapy today.  Treatment took place in water 3.5-4.75 ft in depth at the [facility] pool. Temp of water was 91.  Pt entered/exited the pool via stairs in step-to, step-through pattern independently with bil rail.   - unsupported walking forward/ backward, cues for vertical trunk and even step length - side stepping into wide squat with arm add/abdct with yellow ->rainbow hand floats x 2 laps - UE on wall: split squats x 8 each LE -> walking split squats with UE On rainbow hand floats - plank with yellow hand floats with alternating hip extension 2 x 5 (difficulty maintaining balance) - squats pushing rainbow hand float under water x 10 - UE on yellow hand floats: single leg squats 2x 5; Rt heel raises x10; 3 way LE kick 2x10 RLE, 1x10 LLE - single leg forward leans with yellow hand floats x 10 RLE ,x 5 LLE  [facility] Adult PT Treatment:                                             Date: 12/03/23 Pt seen for aquatic therapy today.  Treatment took place in water 3.5-4.75 ft in depth at the [facility] pool. Temp of water was 91.  Pt entered/exited the pool via stairs in step-to, step-through pattern independently with bil rail.   - unsupported walking forward/ backward, cues for vertical  trunk and even step length - side stepping with arm add/abdct with yellow hand floats x 2 laps - UE on yellow hand floats: toe/heel raises x10; 2x10 leg swings into hip flex/extension and hip abdct/ add (2nd set quicker/smaller range) - noodle stomp with hollow noodle, 10 slow, 10 quick each LE, in hip flexion repeated with solid noodle into flexion and abdct (improved) - standing balance on solid noodle-> squats x 10 - side plank on bench in water with x 5 starfish each side (challenge on L side) - plank on bench in water with hip ext x 10 each LE; fire hydrants x 5 each - STS from 3rd step in water x10   [facility] Adult PT Treatment:                                             Date: 11/25/23 Pt seen for aquatic therapy today.  Treatment took place in water 3.5-4.75 ft in depth at the [facility] pool. Temp of water was 91.  Pt entered/exited the pool via stairs in step-to, step-through pattern independently with bil rail.  - Intro to aquatic therapy principles - unsupported walking forward/ backward, cues for vertical trunk - unsupported side stepping -> with arm add/abdct with rainbow hand floats - suitcase carry with bil/single rainbow hand float by side, marching forward/backward - UE on wall:  toe/heel raises x10; hip add/abdct x10 ; hip flexion /extension x10; relaxed squats x 3 - marching forward/backward - UE on rainbow hand floats: SLS x 20sec each; 3 way LE kick 2x5 in R SLS, 1x5 L SLS, cues for  - TrA set with long hollow noodle pull down to thighs 2x 5 in standard stance and staggered stance  Pt requires the buoyancy and hydrostatic pressure of water for support, and to offload joints by unweighting joint load by at least 50 % in navel deep water and by at least 75-80% in chest to neck deep water.  Viscosity of the water is needed for resistance of strengthening. Water current perturbations provides challenge to standing balance requiring increased core activation.      [facility] Adult PT Treatment:                                                DATE: 11/21/23 Re-assessment testing: balance, 5 x STS, strength   10/31/23 bike x 6 minutes- PT present to discuss progress  Supine blue band hip extension 20x  Prone glute squeeze 10x Prone over 1 pillow hip extension 10x right/left Side planks 10x right only Supine propped on wedge: hip thruster with pair of 5# weights on hips 2 x10 Sit to stand pair of 5# dumbbells Lateral lean to the left on the back of the UBE seat: left hip flexion (partially unloading right LE) Cable walk 10x backwards only with belt: 10x Hip machine 30# right only: hip abduction and extension 2 x10   10/29/23 Nu-Step  level 5 x 6 minutes- PT present to discuss progress  6 inch step ups  right leading 10x 2 hand support, lateral step up x10 on Rt  WB on right with step taps 10x using 8 steps Single leg stance on Rt with lunge taps to colored discs on floor-forward and lateral  bil  Resisted walking -Side stepping: 10# x8 each with close CGA Leg press seat 6 85# bil 2x10; 45# single leg 2x10 right/left At the stairs 2nd step hip flexor, gastroc and quadratus lumborum muscle lengthening 3 sets of 5 right/left: Rocking forward and back with arm reach up and over Sidestepping lateral over hurdles x4 laps  10/24/23  Nu-Step  level 5 x 6 minutes- PT present to discuss progress  6 inch step ups  right leading 10x 2 hand support WB on right with step taps 10x  Side stepping with pink power cord 8x right/left Side wall plank left: with left hip flexion 2x10 Side wall plank to the left with left hip abduction 2x10 At the stairs 2nd step hip flexor, gastroc and quadratus lumborum muscle lengthening 3 sets of 5 right/left: Rocking forward and back with arm reach up and over 10/22/23 Bike: level 2x 6 minutes- PT present to discuss progress  Side step cable 5 # with belt 7x right/left At the stairs 2nd step hip flexor, gastroc and quadratus  lumborum muscle lengthening 3 sets of 5 right/left: Rocking forward and back with arm reach up and over 2 rounds 1 min rest break b/w rounds:   10 lateral sliders each side right/left 6 clockwise and counterclock slider circles right/left 10 single leg march with 5# weight in same side hand right/left   Side wall plank:with left hip flexion 10x needs UE support  Bridge with feet on 8 inch step 10x Supine with heel taps for core activation 2x10 Side bridge 8x right/left  PATIENT EDUCATION:  Education details: [email or web address] Person educated: Patient Education method: Explanation, Demonstration, Verbal cues, and Handouts Education comprehension: verbalized understanding and returned demonstration  HOME EXERCISE PROGRAM: Access Code: [email or web address] URL: [email or web address] Date: 07/12/2023 Prepared by: Orvil Beuhring  Exercises - Supine Posterior Pelvic Tilt  - 2 x daily - 7 x weekly - 2 sets - 10 reps - Supine Short Arc Quad  - 2 x daily - 7 x weekly - 2 sets - 10 reps - Supine Hip Adduction Isometric with Ball  - 2 x daily - 7 x weekly - 2 sets - 10 reps - 5 hold - Quadruped Rocking Backward  - 2 x daily - 7 x weekly - 1-2 sets - 10 reps - 5 hold - Seated Isometric Hip Abduction  - 2 x daily - 7 x weekly - 2 sets - 10 reps - 5 hold - Standing Knee Flexion AROM with Chair Support  - 2 x daily - 7 x weekly - 2 sets - 10 reps  ASSESSMENT:  CLINICAL IMPRESSION: Focus on glute strength. Pt edu on leveling hips with amb and use of cane if amb for any distance or length of time to avoid LBP. Pt glut strength visually improving. Goals ongoing.     Re-cert: Pt had injection by Dr Genelle 11/20/23 and presents at aquatic therapy with additional orders for aquatic PT intervention to begin. Pt presents without SPC. Pt has had at least a 3 week hiatus from therapy. Testing completed.  Results variable.  LEFS has declined steadily since evaluation with her lowest score today.  Likely do  to her long duration of care and more exaggerated perception of her lack of ability. Strength testing partly completed with hand dynamometer.  She has obvious deficits in right hip abduction. Gait is with a marked trendelenburg. Pt is encouraged to use SPC when amb to aquatics due to distance and gross limp to improve gait and  reduce LBP which she reports is chronic.  She reports she will be leaving for [place]  dec 23 for the remainder of the winter. Plan to progress aquatics for the next few week then will assess returning to land. She is a good candidate for aquatic intervention and will benefit from the properties of water to progress towards functional goals.      OBJECTIVE IMPAIRMENTS: Abnormal gait, decreased balance, decreased coordination, difficulty walking, decreased strength, increased edema, impaired flexibility, improper body mechanics, postural dysfunction, and pain.   ACTIVITY LIMITATIONS: lifting, bending, squatting, stairs, and locomotion level  PARTICIPATION LIMITATIONS: cleaning, laundry, shopping, community activity, and yard work  PERSONAL FACTORS: Age are also affecting patient's functional outcome.   REHAB POTENTIAL: Excellent  CLINICAL DECISION MAKING: Evolving/moderate complexity  EVALUATION COMPLEXITY: Moderate   GOALS: Goals reviewed with patient? Yes  SHORT TERM GOALS: Target date: 08/09/23  Pt will be ind with initial HEP Baseline: Goal status: Met on 08/01/23  2.  Pt will be educated on movements to avoid to follow post-op protocol to protect tendon transfer site Baseline:  Goal status: MET  3.  Pt will demo improved Rt hip control in stance phase of gait with single walking stick as needed. Baseline:  Goal status: MET 08/07/23    LONG TERM GOALS: Target date: 01/17/2024   Pt will be ind with advanced HEP Baseline:  Goal status: ongoing  2.  Pt will improve LEFS score to at least 55/80 to demo improved function Baseline: 44/80 Goal status:  ongoing  3.  Pt will perform 6 min walk test covering at least 700' with walking stick as needed Baseline:  Goal status: MET 08/07/23 with walking stick  4.  Pt will be able to demo squat, lift 10lb, carry 10lb and walk to mimic household chores and yard activities with proper body mechanics without pain. Baseline:  Goal status: ongoing  5.  Pt will improve glut strength to at least 4+/5 to improve gait mechanics, stairs and transfers. Baseline:  Goal status: Progressing   6.  5x STS improved to 11 sec or less to demo improved functional strength and reduce fall risk. Baseline: 13.23 Goal status: ongoing    7. Pt will tolerate walking to and from setting and engaging in aquatic therapy session without excessive fatigue or increase in pain to demonstrate improved toleration to activity.  Baseline:  Goal Status: NEW   8. Pt will tolerate stair climbing using alternating pattern ascending and descending 6 steps with or without use of handrail   Baseline:  Goal Status: NEW   9. Pt will improve strength in right hip abd by at least 5lbs (using HD) to reduce trendelenburg gait pattern and improve function  Baseline: marked pattern  Goal Status: NEW  PLAN:  PT FREQUENCY: 2x/week  PT DURATION: 8 weeks  PLANNED INTERVENTIONS: 97110-Therapeutic exercises, 97530- Therapeutic activity, [id number]- Neuromuscular re-education, 97535- Self Care, [id number]- Manual therapy, [phone]- Gait training, Patient/Family education, Balance training, Stair training, Cryotherapy, and Moist heat.  PLAN FOR NEXT SESSION: assess response to  aquatic therapy, stairs in water  [place]) Jajaira Ruis MPT 12/18/23 9:41 AM [facility] [address] [place], [place], [address] Phone: [phone]   Fax:  [phone]

## 2023-12-24 ENCOUNTER — Ambulatory Visit (HOSPITAL_BASED_OUTPATIENT_CLINIC_OR_DEPARTMENT_OTHER): Payer: Self-pay | Admitting: Physical Therapy

## 2023-12-24 DIAGNOSIS — R2689 Other abnormalities of gait and mobility: Secondary | ICD-10-CM

## 2023-12-24 DIAGNOSIS — M25551 Pain in right hip: Secondary | ICD-10-CM

## 2023-12-24 DIAGNOSIS — M6281 Muscle weakness (generalized): Secondary | ICD-10-CM | POA: Diagnosis not present

## 2023-12-24 DIAGNOSIS — R262 Difficulty in walking, not elsewhere classified: Secondary | ICD-10-CM

## 2023-12-24 NOTE — Therapy (Signed)
 OUTPATIENT PHYSICAL THERAPY TREATMENT    Patient Name: Rebecca Long MRN: [id number] DOB:May 16, 1950, 73 y.o., female Today's Date: 12/24/2023   END OF SESSION:  PT End of Session - 12/24/23 1458     Visit Number 37    Number of Visits 45    Date for Recertification  01/17/24    Authorization Type HTA no auth required    Progress Note Due on Visit 39    PT Start Time 1445    PT Stop Time 1525    PT Time Calculation (min) 40 min    Activity Tolerance Patient tolerated treatment well    Behavior During Therapy WFL for tasks assessed/performed             Past Medical History:  Diagnosis Date   Anxiety    Cavus foot, acquired 09/09/2012    bilateral cavus feet the one on the left shows that she has some probable neurogenic weakness with abnormal curvature and in inversion contracture Podiatry 06/2015: H&P and x-ray reviewed with patient. Today I went ahead and I did a proximal nerve block I was able to aspirate the second MPJ and got out amount of clear fluid and injected with a quarter cc dexamethasone  Kenalog  and ap   DDD (degenerative disc disease), lumbar    Depression    Endometrial polyp    GERD (gastroesophageal reflux disease)    Hiatal hernia    Hip flexor tendinitis, right 07/02/2016   History of adenomatous polyp of colon    History of esophageal stricture    History of gastric polyp 11/2017   History of PCR DNA positive for HSV2 11/12/2016   History of primary hyperparathyroidism    s/p  left parathryoidectomy 09-17-2008-- resolved   Hyperlipidemia    Hypertension    Insomnia    Lumbar stenosis    Osteopenia    PONV (postoperative nausea and vomiting)    Schatzki's ring    Scoliosis    Tendinopathy of right biceps tendon 07/02/2016   Thickened endometrium    Uterine fibroid    UTI (urinary tract infection) due to Enterococcus 01/05/2020   Wears glasses    Past Surgical History:  Procedure Laterality Date   BREAST REDUCTION SURGERY Bilateral 2000    COLONOSCOPY     CYST EXCISION  1985   head and rt axilla   DILATATION & CURETTAGE/HYSTEROSCOPY WITH MYOSURE N/A 08/13/2018   Procedure: DILATATION & CURETTAGE/HYSTEROSCOPY WITH MYOSURE;  Surgeon: Lavoie, Marie-Lyne, MD;  Location: [facility];  Service: Gynecology;  Laterality: N/A;  requests 10:15am in [place] [facility] block request 30 minutes   DILATION AND CURETTAGE OF UTERUS N/A 08/13/2018   ECTOPIC PREGNANCY SURGERY  1981   OPEN SURGICAL REPAIR OF GLUTEAL TENDON Right 07/02/2023   Procedure: REPAIR, TENDON, GLUTEUS MEDIUS, OPEN;  Surgeon: Genelle Standing, MD;  Location: [facility];  Service: Orthopedics;  Laterality: Right;  RIGHT GLUTEUS MAXIMUS TENDON TRANSFER WITH COLLAGEN PATCH AUGMENTATION   PARATHYROIDECTOMY Left 09/17/2008   dr jeoffrey dawn  @[facility]    left superior  (adenoma)   REDUCTION MAMMAPLASTY Bilateral 2000   years ago   SHOULDER SURGERY Right 2007   frozen   UPPER GASTROINTESTINAL ENDOSCOPY  last one 12-18-2017   dr wilhelmenia   Patient Active Problem List   Diagnosis Date Noted   Tendinopathy of gluteus medius 07/02/2023   Greater trochanteric bursitis of right hip 04/17/2022   C. difficile diarrhea 03/08/2022   Arthritis of right sacroiliac joint 02/26/2022  Encounter for monitoring long-term proton pump inhibitor therapy 07/05/2021   Hx of colonic polyps 06/06/2021   Incontinence of feces with fecal urgency 06/06/2021   Sleep disorder 11/29/2020   Benign paroxysmal positional vertigo of right ear 08/30/2020   AC (acromioclavicular) arthritis 10/22/2019   Adhesive bursitis of left shoulder 07/22/2019   GERD with esophagitis 12/30/2017   Hyperlipidemia 05/15/2017   Morbid obesity (HCC) 11/12/2016   Essential hypertension, benign 10/06/2014   Depression with anxiety 10/06/2014   Stress incontinence 10/06/2014   Osteoporosis 10/06/2014   Degenerative disc disease, lumbar 09/09/2012   Spinal stenosis of lumbar region 09/09/2012     PCP: Charlies Bellini, DO  REFERRING PROVIDER: Elspeth Parker, MD  REFERRING DIAG: post-op REPAIR, TENDON, GLUTEUS MEDIUS, OPEN (Right) - RIGHT GLUTEUS MAXIMUS TENDON TRANSFER WITH COLLAGEN PATCH AUGMENTATION  Rationale for Evaluation and Treatment: Rehabilitation  THERAPY DIAG:  Muscle weakness (generalized)  Difficulty in walking, not elsewhere classified  Pain in right hip  Other abnormalities of gait and mobility  ONSET DATE: 07/02/23 surgery date - chronic pain and weakness prior to surgery  SUBJECTIVE:                                                                                                                                                                                           SUBJECTIVE STATEMENT: Did well after last treatment.  Pt reports she doesn't see any strength improvement since doing aquatic therapy. Still using cane with ambulation.   PAIN:   Are you having pain? no NPRS scale: 0 Pain location: Rt hip   Pain orientation:  PAIN TYPE: ache  Pain description:  Aggravating factors: laying on Rt side, in/out of car, walking Relieving factors: sitting   PRECAUTIONS: Other: protocol from Dr. Parker scanned into media section of chart - avoid until 6 weeks post-op which is 08/13/23: passive ADDuction and ER, active ABDuction and IR  RED FLAGS: None   WEIGHT BEARING RESTRICTIONS: PWB initially, doctor just cleared to get rid of walking stick when ready  FALLS:  Has patient fallen in last 6 months? Yes. Number of falls 1, while carrying a planter and going up stairs  LIVING ENVIRONMENT: Lives with: lives with their spouse Lives in: House/apartment Stairs: Yes: External: 3 steps; on left going up Has following equipment at home: Single point cane  OCCUPATION: retired  PLOF: Independent  PATIENT GOALS: walk without pain and walking stick, can I get back to pickleball?  NEXT MD VISIT: 3 weeks  OBJECTIVE:  Note: Objective measures were completed  at Evaluation unless otherwise noted.  DIAGNOSTIC FINDINGS:  Xray (3 views right hip): No femoral acetabular  osteoarthritis   MRI (right hip): There is an end-stage full-thickness tear of the gluteus medius with significant atrophy on T1 coronal view  PATIENT SURVEYS:  LEFS: 44/80 7/16: LEFS  42/80 9/4: 32/80 10/23: 27/80  COGNITION: Overall cognitive status: Within functional limits for tasks assessed     SENSATION: WFL  MUSCLE LENGTH:   POSTURE: flexed trunk   PALPATION: Mild tenderness around incision - steri strips placed today after stitches were removed  LUMBAR ROM:  WFL  LOWER EXTREMITY ROM:    Pt able to perform seated march A/ROM and quadruped rocking for hip flexion beyond 90 deg without pain  11/21/23 Right hip IR/ER full  LOWER EXTREMITY MMT:   Lt LE 4+/5 Rt hip at least 3+/5 - isometric testing only, resisted isometrics strong and painfree  11/21/23 Muscle testing using HD (LBS) R hip flex 38.6  abd 20.6 L hip flex 38.6  abd 31.6  MMT right hip extension 3+  Right hip abd 3 to 3+  FUNCTIONAL TESTS:  Eval: 5 times sit to stand: 13.23 Timed up and go (TUG): 12.24 using walking stick 2 MWT: 315 feet using single walking stick in Lt UE  08/01/2023: 6 minute walk test:  759 ft with [facility] 08/07/23: 6 minute walk test: 781' with [facility] 7/15: 5x STS with hands: 10.40 5x STS no hands 11.19 TUG: with cane: 9.29 sec  09/05/23: 6 MWT 1010 feet with SPC 5x STS no hands 9.94  11/21/23 5x STS from bench: 15.04 GAIT: Distance walked: 315' in 2 min  Assistive device utilized: single walking stick Level of assistance: Modified independence Comments: Rt early stance phase Trendelenburg  TREATMENT DATE: [facility] Adult PT Treatment:                                             Date: 12/24/23 Pt seen for aquatic therapy today.  Treatment took place in water 3.5-4.75 ft in depth at the [facility] pool. Temp of water was 91.  Pt entered/exited the pool  via stairs in step-to, step-through pattern independently with bil rail.  - unsupported walking forward/ backward, multiple reps for warm up - single leg squat x 5 each withUE support on floats (unsteady) - side step into wide squat with arm add/abdct with yellow hand floats  - UE On rainbow hand floats, fin donned on Rt ankle: LE swings into abdct/add, hip flexion/extension - SLS with solid noodle stomp (10 slow/10 quick) each LE  - reciprocal pattern up/down 6 steps x 2  - Rt runners step ups with light UE support on rail (up quick, down slow) - Rt lateral step ups with light UE support on wall (good coordination and muscle control) -resisted hip extension using rider band 2 x 10.  VC and demonstration for execution  - prone position with noodle under hips, UE on bench, flutter kick 1 min -hip hiking bottom of pool R/L x10. Cues and demonstration for execution - straddling noodle without UE support:  cycling (cues for even LE use), reverse jumping jacks and cross country ski  [facility] Adult PT Treatment:                                             Date: 12/18/23 Pt seen for aquatic  therapy today.  Treatment took place in water 3.5-4.75 ft in depth at the [facility] pool. Temp of water was 91.  Pt entered/exited the pool via stairs in step-to, step-through pattern independently with bil rail.  - unsupported walking forward/ backward, side stepping with arm add/abct with yellow hand floats - Rt runners step ups with light UE support on rail (up quick, down slow) - Rt/Lt lateral step ups with light UE support on wall (good coordination and muscle control) -STS from 3rd from bottom step x 10->repeated from 4th from bottom step. Cues for execution/proper glute engagement -hip hiking bottom of pool R/L 2 x 8. Cues and demonstration for execution -resisted hip extension using rider band 2 x 10.  VC and demonstration for execution  - straddling noodle without UE support:  cycling (cues for  even LE use), suspended cross country ski and reverse jumping jacks   [facility] Adult PT Treatment:                                             Date: 12/16/23 Pt seen for aquatic therapy today.  Treatment took place in water 3.5-4.75 ft in depth at the [facility] pool. Temp of water was 91.  Pt entered/exited the pool via stairs in step-to, step-through pattern independently with bil rail.  - unsupported walking forward/ backward, side stepping with arm add/abct with yellow hand floats - Rt runners step ups with light UE support on rail (up quick, down slow) - Rt/Lt lateral step ups with light UE support on wall (improved!) - UE On wall:  split squats x 10 each LE -> walking split squats 2 laps with yellow hand floats - first lap forward/ 2nd lap backwards - UE on yellow hand floats: ; hip abdct/addct crossing midline 2x10; Rt single leg heel raise x 10; single leg forward leans with yellow hand floats x 10 in Rt SLS - straddling noodle without UE support:  cycling (cues for even LE use), suspended cross country ski and reverse jumping jacks   [facility] Adult PT Treatment:                                             Date: 12/09/23 Pt seen for aquatic therapy today.  Treatment took place in water 3.5-4.75 ft in depth at the [facility] pool. Temp of water was 91.  Pt entered/exited the pool via stairs in step-to, step-through pattern independently with bil rail.  - unsupported walking forward/ backward, side stepping with arm add/abct with yellow hand floats - Rt runners step ups with light UE support on rail (up quick, down slow) - Rt lateral step ups with light UE support on wall (challenge) - UE on yellow hand floats: walking split squats 2 laps; hip abdct/addct crossing midline 2x10; Rt single leg heel raise x 10; single leg forward leans with yellow hand floats 2x5 each LE - SLS with solid noodle stomp (10 slow/10 quick) each LE  - straddling noodle without UE support:  cycling  (cues for even LE use), suspended cross country ski and reverse jumping jacks     [facility] Adult PT Treatment:  Date: 12/06/23 Pt seen for aquatic therapy today.  Treatment took place in water 3.5-4.75 ft in depth at the [facility] pool. Temp of water was 91.  Pt entered/exited the pool via stairs in step-to, step-through pattern independently with bil rail.   - unsupported walking forward/ backward, cues for vertical trunk and even step length - side stepping into wide squat with arm add/abdct with yellow ->rainbow hand floats x 2 laps - UE on wall: split squats x 8 each LE -> walking split squats with UE On rainbow hand floats - plank with yellow hand floats with alternating hip extension 2 x 5 (difficulty maintaining balance) - squats pushing rainbow hand float under water x 10 - UE on yellow hand floats: single leg squats 2x 5; Rt heel raises x10; 3 way LE kick 2x10 RLE, 1x10 LLE - single leg forward leans with yellow hand floats x 10 RLE ,x 5 LLE  [facility] Adult PT Treatment:                                             Date: 12/03/23 Pt seen for aquatic therapy today.  Treatment took place in water 3.5-4.75 ft in depth at the [facility] pool. Temp of water was 91.  Pt entered/exited the pool via stairs in step-to, step-through pattern independently with bil rail.   - unsupported walking forward/ backward, cues for vertical trunk and even step length - side stepping with arm add/abdct with yellow hand floats x 2 laps - UE on yellow hand floats: toe/heel raises x10; 2x10 leg swings into hip flex/extension and hip abdct/ add (2nd set quicker/smaller range) - noodle stomp with hollow noodle, 10 slow, 10 quick each LE, in hip flexion repeated with solid noodle into flexion and abdct (improved) - standing balance on solid noodle-> squats x 10 - side plank on bench in water with x 5 starfish each side (challenge on L side) - plank on  bench in water with hip ext x 10 each LE; fire hydrants x 5 each - STS from 3rd step in water x10   [facility] Adult PT Treatment:                                             Date: 11/25/23 Pt seen for aquatic therapy today.  Treatment took place in water 3.5-4.75 ft in depth at the [facility] pool. Temp of water was 91.  Pt entered/exited the pool via stairs in step-to, step-through pattern independently with bil rail.  - Intro to aquatic therapy principles - unsupported walking forward/ backward, cues for vertical trunk - unsupported side stepping -> with arm add/abdct with rainbow hand floats - suitcase carry with bil/single rainbow hand float by side, marching forward/backward - UE on wall:  toe/heel raises x10; hip add/abdct x10 ; hip flexion /extension x10; relaxed squats x 3 - marching forward/backward - UE on rainbow hand floats: SLS x 20sec each; 3 way LE kick 2x5 in R SLS, 1x5 L SLS, cues for  - TrA set with long hollow noodle pull down to thighs 2x 5 in standard stance and staggered stance  Pt requires the buoyancy and hydrostatic pressure of water for support, and to offload joints by unweighting joint load by at  least 50 % in navel deep water and by at least 75-80% in chest to neck deep water.  Viscosity of the water is needed for resistance of strengthening. Water current perturbations provides challenge to standing balance requiring increased core activation.     [facility] Adult PT Treatment:                                                DATE: 11/21/23 Re-assessment testing: balance, 5 x STS, strength   10/31/23 bike x 6 minutes- PT present to discuss progress  Supine blue band hip extension 20x  Prone glute squeeze 10x Prone over 1 pillow hip extension 10x right/left Side planks 10x right only Supine propped on wedge: hip thruster with pair of 5# weights on hips 2 x10 Sit to stand pair of 5# dumbbells Lateral lean to the left on the back of the UBE seat: left hip  flexion (partially unloading right LE) Cable walk 10x backwards only with belt: 10x Hip machine 30# right only: hip abduction and extension 2 x10   10/29/23 Nu-Step  level 5 x 6 minutes- PT present to discuss progress  6 inch step ups  right leading 10x 2 hand support, lateral step up x10 on Rt  WB on right with step taps 10x using 8 steps Single leg stance on Rt with lunge taps to colored discs on floor-forward and lateral bil  Resisted walking -Side stepping: 10# x8 each with close CGA Leg press seat 6 85# bil 2x10; 45# single leg 2x10 right/left At the stairs 2nd step hip flexor, gastroc and quadratus lumborum muscle lengthening 3 sets of 5 right/left: Rocking forward and back with arm reach up and over Sidestepping lateral over hurdles x4 laps  10/24/23  Nu-Step  level 5 x 6 minutes- PT present to discuss progress  6 inch step ups  right leading 10x 2 hand support WB on right with step taps 10x  Side stepping with pink power cord 8x right/left Side wall plank left: with left hip flexion 2x10 Side wall plank to the left with left hip abduction 2x10 At the stairs 2nd step hip flexor, gastroc and quadratus lumborum muscle lengthening 3 sets of 5 right/left: Rocking forward and back with arm reach up and over 10/22/23 Bike: level 2x 6 minutes- PT present to discuss progress  Side step cable 5 # with belt 7x right/left At the stairs 2nd step hip flexor, gastroc and quadratus lumborum muscle lengthening 3 sets of 5 right/left: Rocking forward and back with arm reach up and over 2 rounds 1 min rest break b/w rounds:   10 lateral sliders each side right/left 6 clockwise and counterclock slider circles right/left 10 single leg march with 5# weight in same side hand right/left   Side wall plank:with left hip flexion 10x needs UE support  Bridge with feet on 8 inch step 10x Supine with heel taps for core activation 2x10 Side bridge 8x right/left  PATIENT EDUCATION:  Education details:  [email or web address] Person educated: Patient Education method: Explanation, Demonstration, Verbal cues, and Handouts Education comprehension: verbalized understanding and returned demonstration  HOME EXERCISE PROGRAM: Access Code: [email or web address] URL: [email or web address] Date: 07/12/2023 Prepared by: Orvil Beuhring  Exercises - Supine Posterior Pelvic Tilt  - 2 x daily - 7 x weekly - 2 sets - 10 reps - Supine Short Arc Quad  - 2 x  daily - 7 x weekly - 2 sets - 10 reps - Supine Hip Adduction Isometric with Ball  - 2 x daily - 7 x weekly - 2 sets - 10 reps - 5 hold - Quadruped Rocking Backward  - 2 x daily - 7 x weekly - 1-2 sets - 10 reps - 5 hold - Seated Isometric Hip Abduction  - 2 x daily - 7 x weekly - 2 sets - 10 reps - 5 hold - Standing Knee Flexion AROM with Chair Support  - 2 x daily - 7 x weekly - 2 sets - 10 reps  ASSESSMENT:  CLINICAL IMPRESSION: Pt with good tolerance for all aquatic exercises; no pain in hip.  Stair climbing with limited UE support has improved. Therapist to assess goals next session (progress note) and determine if she will continue land or water, or d/c.  [place].       Re-cert: Pt had injection by Dr Genelle 11/20/23 and presents at aquatic therapy with additional orders for aquatic PT intervention to begin. Pt presents without SPC. Pt has had at least a 3 week hiatus from therapy. Testing completed.  Results variable.  LEFS has declined steadily since evaluation with her lowest score today.  Likely do to her long duration of care and more exaggerated perception of her lack of ability. Strength testing partly completed with hand dynamometer.  She has obvious deficits in right hip abduction. Gait is with a marked trendelenburg. Pt is encouraged to use SPC when amb to aquatics due to distance and gross limp to improve gait and reduce LBP which she reports is chronic.  She reports she will be leaving for [place]  dec 23 for the  remainder of the winter. Plan to progress aquatics for the next few week then will assess returning to land. She is a good candidate for aquatic intervention and will benefit from the properties of water to progress towards functional goals.      OBJECTIVE IMPAIRMENTS: Abnormal gait, decreased balance, decreased coordination, difficulty walking, decreased strength, increased edema, impaired flexibility, improper body mechanics, postural dysfunction, and pain.   ACTIVITY LIMITATIONS: lifting, bending, squatting, stairs, and locomotion level  PARTICIPATION LIMITATIONS: cleaning, laundry, shopping, community activity, and yard work  PERSONAL FACTORS: Age are also affecting patient's functional outcome.   REHAB POTENTIAL: Excellent  CLINICAL DECISION MAKING: Evolving/moderate complexity  EVALUATION COMPLEXITY: Moderate   GOALS: Goals reviewed with patient? Yes  SHORT TERM GOALS: Target date: 08/09/23  Pt will be ind with initial HEP Baseline: Goal status: Met on 08/01/23  2.  Pt will be educated on movements to avoid to follow post-op protocol to protect tendon transfer site Baseline:  Goal status: MET  3.  Pt will demo improved Rt hip control in stance phase of gait with single walking stick as needed. Baseline:  Goal status: MET 08/07/23    LONG TERM GOALS: Target date: 01/17/2024   Pt will be ind with advanced HEP Baseline:  Goal status: ongoing  2.  Pt will improve LEFS score to at least 55/80 to demo improved function Baseline: 44/80 Goal status: ongoing  3.  Pt will perform 6 min walk test covering at least 700' with walking stick as needed Baseline:  Goal status: MET 08/07/23 with walking stick  4.  Pt will be able to demo squat, lift 10lb, carry 10lb and walk to mimic household chores and yard activities with proper body mechanics without pain. Baseline:  Goal status: ongoing  5.  Pt will improve glut strength to at least 4+/5 to improve gait mechanics, stairs  and transfers. Baseline:  Goal status: Progressing   6.  5x STS improved to 11 sec or less to demo improved functional strength and reduce fall risk. Baseline: 13.23 Goal status: ongoing    7. Pt will tolerate walking to and from setting and engaging in aquatic therapy session without excessive fatigue or increase in pain to demonstrate improved toleration to activity.  Baseline:  Goal Status: NEW   8. Pt will tolerate stair climbing using alternating pattern ascending and descending 6 steps with or without use of handrail   Baseline:  Goal Status: NEW   9. Pt will improve strength in right hip abd by at least 5lbs (using HD) to reduce trendelenburg gait pattern and improve function  Baseline: marked pattern  Goal Status: NEW  PLAN:  PT FREQUENCY: 2x/week  PT DURATION: 8 weeks  PLANNED INTERVENTIONS: 97110-Therapeutic exercises, 97530- Therapeutic activity, [id number]- Neuromuscular re-education, 97535- Self Care, [id number]- Manual therapy, [phone]- Gait training, Patient/Family education, Balance training, Stair training, Cryotherapy, and Moist heat.  PLAN FOR NEXT SESSION: progress note.   Delon Aquas, PTA 12/24/23 6:05 PM [facility] [address] [place], [place], [address] Phone: [phone]   Fax:  [phone]

## 2024-01-07 ENCOUNTER — Encounter: Payer: Self-pay | Admitting: Physical Therapy

## 2024-01-07 ENCOUNTER — Ambulatory Visit: Attending: Orthopaedic Surgery | Admitting: Physical Therapy

## 2024-01-07 DIAGNOSIS — R2689 Other abnormalities of gait and mobility: Secondary | ICD-10-CM | POA: Diagnosis present

## 2024-01-07 DIAGNOSIS — R262 Difficulty in walking, not elsewhere classified: Secondary | ICD-10-CM | POA: Diagnosis present

## 2024-01-07 DIAGNOSIS — M25551 Pain in right hip: Secondary | ICD-10-CM | POA: Diagnosis present

## 2024-01-07 DIAGNOSIS — M6281 Muscle weakness (generalized): Secondary | ICD-10-CM | POA: Diagnosis present

## 2024-01-07 NOTE — Therapy (Signed)
 OUTPATIENT PHYSICAL THERAPY TREATMENT/DISCHARGE SUMMARY    Patient Name: Rebecca Long MRN: [id number] DOB:1950/10/12, 73 y.o., female Today's Date: 01/07/2024   END OF SESSION:  PT End of Session - 01/07/24 1535     Visit Number 38    Date for Recertification  01/17/24    Authorization Type HTA no auth required    Progress Note Due on Visit 39    PT Start Time 1531    PT Stop Time 1614    PT Time Calculation (min) 43 min    Activity Tolerance Patient tolerated treatment well             Past Medical History:  Diagnosis Date   Anxiety    Cavus foot, acquired 09/09/2012    bilateral cavus feet the one on the left shows that she has some probable neurogenic weakness with abnormal curvature and in inversion contracture Podiatry 06/2015: H&P and x-ray reviewed with patient. Today I went ahead and I did a proximal nerve block I was able to aspirate the second MPJ and got out amount of clear fluid and injected with a quarter cc dexamethasone  Kenalog  and ap   DDD (degenerative disc disease), lumbar    Depression    Endometrial polyp    GERD (gastroesophageal reflux disease)    Hiatal hernia    Hip flexor tendinitis, right 07/02/2016   History of adenomatous polyp of colon    History of esophageal stricture    History of gastric polyp 11/2017   History of PCR DNA positive for HSV2 11/12/2016   History of primary hyperparathyroidism    s/p  left parathryoidectomy 09-17-2008-- resolved   Hyperlipidemia    Hypertension    Insomnia    Lumbar stenosis    Osteopenia    PONV (postoperative nausea and vomiting)    Schatzki's ring    Scoliosis    Tendinopathy of right biceps tendon 07/02/2016   Thickened endometrium    Uterine fibroid    UTI (urinary tract infection) due to Enterococcus 01/05/2020   Wears glasses    Past Surgical History:  Procedure Laterality Date   BREAST REDUCTION SURGERY Bilateral 2000   COLONOSCOPY     CYST EXCISION  1985   head and rt axilla    DILATATION & CURETTAGE/HYSTEROSCOPY WITH MYOSURE N/A 08/13/2018   Procedure: DILATATION & CURETTAGE/HYSTEROSCOPY WITH MYOSURE;  Surgeon: Lavoie, Marie-Lyne, MD;  Location: [facility];  Service: Gynecology;  Laterality: N/A;  requests 10:15am in [place] Gyn block request 30 minutes   DILATION AND CURETTAGE OF UTERUS N/A 08/13/2018   ECTOPIC PREGNANCY SURGERY  1981   OPEN SURGICAL REPAIR OF GLUTEAL TENDON Right 07/02/2023   Procedure: REPAIR, TENDON, GLUTEUS MEDIUS, OPEN;  Surgeon: Genelle Standing, MD;  Location: [facility];  Service: Orthopedics;  Laterality: Right;  RIGHT GLUTEUS MAXIMUS TENDON TRANSFER WITH COLLAGEN PATCH AUGMENTATION   PARATHYROIDECTOMY Left 09/17/2008   dr jeoffrey dawn  @[facility]    left superior  (adenoma)   REDUCTION MAMMAPLASTY Bilateral 2000   years ago   SHOULDER SURGERY Right 2007   frozen   UPPER GASTROINTESTINAL ENDOSCOPY  last one 12-18-2017   dr wilhelmenia   Patient Active Problem List   Diagnosis Date Noted   Tendinopathy of gluteus medius 07/02/2023   Greater trochanteric bursitis of right hip 04/17/2022   C. difficile diarrhea 03/08/2022   Arthritis of right sacroiliac joint 02/26/2022   Encounter for monitoring long-term proton pump inhibitor therapy 07/05/2021   Hx of colonic polyps  06/06/2021   Incontinence of feces with fecal urgency 06/06/2021   Sleep disorder 11/29/2020   Benign paroxysmal positional vertigo of right ear 08/30/2020   AC (acromioclavicular) arthritis 10/22/2019   Adhesive bursitis of left shoulder 07/22/2019   GERD with esophagitis 12/30/2017   Hyperlipidemia 05/15/2017   Morbid obesity (HCC) 11/12/2016   Essential hypertension, benign 10/06/2014   Depression with anxiety 10/06/2014   Stress incontinence 10/06/2014   Osteoporosis 10/06/2014   Degenerative disc disease, lumbar 09/09/2012   Spinal stenosis of lumbar region 09/09/2012    PCP: Charlies Bellini, DO  REFERRING PROVIDER: Elspeth Parker,  MD  REFERRING DIAG: post-op REPAIR, TENDON, GLUTEUS MEDIUS, OPEN (Right) - RIGHT GLUTEUS MAXIMUS TENDON TRANSFER WITH COLLAGEN PATCH AUGMENTATION  Rationale for Evaluation and Treatment: Rehabilitation  THERAPY DIAG:  Muscle weakness (generalized)  Difficulty in walking, not elsewhere classified  Pain in right hip  Other abnormalities of gait and mobility  ONSET DATE: 07/02/23 surgery date - chronic pain and weakness prior to surgery  SUBJECTIVE:                                                                                                                                                                                           SUBJECTIVE STATEMENT: I'm not sure if I'm walking better or not.  I'm going to be in [place]  until March.    PAIN:   Are you having pain? no NPRS scale: 0 Pain location: Rt hip   Pain orientation:  PAIN TYPE: ache  Pain description:  Aggravating factors: laying on Rt side, in/out of car, walking Relieving factors: sitting   PRECAUTIONS: Other: protocol from Dr. Parker scanned into media section of chart - avoid until 6 weeks post-op which is 08/13/23: passive ADDuction and ER, active ABDuction and IR  RED FLAGS: None   WEIGHT BEARING RESTRICTIONS: PWB initially, doctor just cleared to get rid of walking stick when ready  FALLS:  Has patient fallen in last 6 months? Yes. Number of falls 1, while carrying a planter and going up stairs  LIVING ENVIRONMENT: Lives with: lives with their spouse Lives in: House/apartment Stairs: Yes: External: 3 steps; on left going up Has following equipment at home: Single point cane  OCCUPATION: retired  PLOF: Independent  PATIENT GOALS: walk without pain and walking stick, can I get back to pickleball?  NEXT MD VISIT: 3 weeks  OBJECTIVE:  Note: Objective measures were completed at Evaluation unless otherwise noted.  DIAGNOSTIC FINDINGS:  Xray (3 views right hip): No femoral acetabular osteoarthritis    MRI (right hip): There is an end-stage full-thickness tear of the gluteus medius with significant atrophy  on T1 coronal view  PATIENT SURVEYS:  LEFS: 44/80 7/16: LEFS  42/80 9/4: 32/80 10/23: 27/80 12/9:  21/80  COGNITION: Overall cognitive status: Within functional limits for tasks assessed     SENSATION: WFL  MUSCLE LENGTH:   POSTURE: flexed trunk   PALPATION: Mild tenderness around incision - steri strips placed today after stitches were removed  LUMBAR ROM:  WFL  LOWER EXTREMITY ROM:    Pt able to perform seated march A/ROM and quadruped rocking for hip flexion beyond 90 deg without pain  11/21/23 Right hip IR/ER full  LOWER EXTREMITY MMT:   Lt LE 4+/5 Rt hip at least 3+/5 - isometric testing only, resisted isometrics strong and painfree 12/9: RIGHT HIP 4-/5 HIP ABDUCTION AND EXTENSION 4/5  11/21/23 Muscle testing using HD (LBS) R hip flex 38.6  abd 20.6 L hip flex 38.6  abd 31.6  MMT right hip extension 3+  Right hip abd 3 to 3+  FUNCTIONAL TESTS:  Eval: 5 times sit to stand: 13.23 Timed up and go (TUG): 12.24 using walking stick 2 MWT: 315 feet using single walking stick in Lt UE  08/01/2023: 6 minute walk test:  759 ft with [facility] 08/07/23: 6 minute walk test: 781' with [facility] 7/15: 5x STS with hands: 10.40 5x STS no hands 11.19 TUG: with cane: 9.29 sec  09/05/23: 6 MWT 1010 feet with SPC 5x STS no hands 9.94  11/21/23 5x STS from bench: 15.04  12/9: 10.57 5x STS no hands GAIT: Distance walked: 315' in 2 min  Assistive device utilized: single walking stick Level of assistance: Modified independence Comments: Rt early stance phase Trendelenburg  TREATMENT DATE: 12/9: LEFS  5X STS Up and down steps reciprocally with double and then single handrail assist Update of HEP see below Dead lift with 10# KB with verbal cues for forward gaze to keep neutral spine    [facility] Adult PT Treatment:                                             Date:  12/24/23 Pt seen for aquatic therapy today.  Treatment took place in water 3.5-4.75 ft in depth at the [facility] pool. Temp of water was 91.  Pt entered/exited the pool via stairs in step-to, step-through pattern independently with bil rail.  - unsupported walking forward/ backward, multiple reps for warm up - single leg squat x 5 each withUE support on floats (unsteady) - side step into wide squat with arm add/abdct with yellow hand floats  - UE On rainbow hand floats, fin donned on Rt ankle: LE swings into abdct/add, hip flexion/extension - SLS with solid noodle stomp (10 slow/10 quick) each LE  - reciprocal pattern up/down 6 steps x 2  - Rt runners step ups with light UE support on rail (up quick, down slow) - Rt lateral step ups with light UE support on wall (good coordination and muscle control) -resisted hip extension using rider band 2 x 10.  VC and demonstration for execution  - prone position with noodle under hips, UE on bench, flutter kick 1 min -hip hiking bottom of pool R/L x10. Cues and demonstration for execution - straddling noodle without UE support:  cycling (cues for even LE use), reverse jumping jacks and cross country ski  [facility] Adult PT Treatment:  Date: 12/18/23 Pt seen for aquatic therapy today.  Treatment took place in water 3.5-4.75 ft in depth at the [facility] pool. Temp of water was 91.  Pt entered/exited the pool via stairs in step-to, step-through pattern independently with bil rail.  - unsupported walking forward/ backward, side stepping with arm add/abct with yellow hand floats - Rt runners step ups with light UE support on rail (up quick, down slow) - Rt/Lt lateral step ups with light UE support on wall (good coordination and muscle control) -STS from 3rd from bottom step x 10->repeated from 4th from bottom step. Cues for execution/proper glute engagement -hip hiking bottom of pool R/L 2 x 8.  Cues and demonstration for execution -resisted hip extension using rider band 2 x 10.  VC and demonstration for execution  - straddling noodle without UE support:  cycling (cues for even LE use), suspended cross country ski and reverse jumping jacks   [facility] Adult PT Treatment:                                             Date: 12/16/23 Pt seen for aquatic therapy today.  Treatment took place in water 3.5-4.75 ft in depth at the [facility] pool. Temp of water was 91.  Pt entered/exited the pool via stairs in step-to, step-through pattern independently with bil rail.  - unsupported walking forward/ backward, side stepping with arm add/abct with yellow hand floats - Rt runners step ups with light UE support on rail (up quick, down slow) - Rt/Lt lateral step ups with light UE support on wall (improved!) - UE On wall:  split squats x 10 each LE -> walking split squats 2 laps with yellow hand floats - first lap forward/ 2nd lap backwards - UE on yellow hand floats: ; hip abdct/addct crossing midline 2x10; Rt single leg heel raise x 10; single leg forward leans with yellow hand floats x 10 in Rt SLS - straddling noodle without UE support:  cycling (cues for even LE use), suspended cross country ski and reverse jumping jacks   [facility] Adult PT Treatment:                                             Date: 12/09/23 Pt seen for aquatic therapy today.  Treatment took place in water 3.5-4.75 ft in depth at the [facility] pool. Temp of water was 91.  Pt entered/exited the pool via stairs in step-to, step-through pattern independently with bil rail.  - unsupported walking forward/ backward, side stepping with arm add/abct with yellow hand floats - Rt runners step ups with light UE support on rail (up quick, down slow) - Rt lateral step ups with light UE support on wall (challenge) - UE on yellow hand floats: walking split squats 2 laps; hip abdct/addct crossing midline 2x10; Rt single leg heel  raise x 10; single leg forward leans with yellow hand floats 2x5 each LE - SLS with solid noodle stomp (10 slow/10 quick) each LE  - straddling noodle without UE support:  cycling (cues for even LE use), suspended cross country ski and reverse jumping jacks     [facility] Adult PT Treatment:  Date: 12/06/23 Pt seen for aquatic therapy today.  Treatment took place in water 3.5-4.75 ft in depth at the [facility] pool. Temp of water was 91.  Pt entered/exited the pool via stairs in step-to, step-through pattern independently with bil rail.   - unsupported walking forward/ backward, cues for vertical trunk and even step length - side stepping into wide squat with arm add/abdct with yellow ->rainbow hand floats x 2 laps - UE on wall: split squats x 8 each LE -> walking split squats with UE On rainbow hand floats - plank with yellow hand floats with alternating hip extension 2 x 5 (difficulty maintaining balance) - squats pushing rainbow hand float under water x 10 - UE on yellow hand floats: single leg squats 2x 5; Rt heel raises x10; 3 way LE kick 2x10 RLE, 1x10 LLE - single leg forward leans with yellow hand floats x 10 RLE ,x 5 LLE  [facility] Adult PT Treatment:                                             Date: 12/03/23 Pt seen for aquatic therapy today.  Treatment took place in water 3.5-4.75 ft in depth at the [facility] pool. Temp of water was 91.  Pt entered/exited the pool via stairs in step-to, step-through pattern independently with bil rail.   - unsupported walking forward/ backward, cues for vertical trunk and even step length - side stepping with arm add/abdct with yellow hand floats x 2 laps - UE on yellow hand floats: toe/heel raises x10; 2x10 leg swings into hip flex/extension and hip abdct/ add (2nd set quicker/smaller range) - noodle stomp with hollow noodle, 10 slow, 10 quick each LE, in hip flexion repeated with solid noodle  into flexion and abdct (improved) - standing balance on solid noodle-> squats x 10 - side plank on bench in water with x 5 starfish each side (challenge on L side) - plank on bench in water with hip ext x 10 each LE; fire hydrants x 5 each - STS from 3rd step in water x10   [facility] Adult PT Treatment:                                             Date: 11/25/23 Pt seen for aquatic therapy today.  Treatment took place in water 3.5-4.75 ft in depth at the [facility] pool. Temp of water was 91.  Pt entered/exited the pool via stairs in step-to, step-through pattern independently with bil rail.  - Intro to aquatic therapy principles - unsupported walking forward/ backward, cues for vertical trunk - unsupported side stepping -> with arm add/abdct with rainbow hand floats - suitcase carry with bil/single rainbow hand float by side, marching forward/backward - UE on wall:  toe/heel raises x10; hip add/abdct x10 ; hip flexion /extension x10; relaxed squats x 3 - marching forward/backward - UE on rainbow hand floats: SLS x 20sec each; 3 way LE kick 2x5 in R SLS, 1x5 L SLS, cues for  - TrA set with long hollow noodle pull down to thighs 2x 5 in standard stance and staggered stance  Pt requires the buoyancy and hydrostatic pressure of water for support, and to offload joints by unweighting joint load by at  least 50 % in navel deep water and by at least 75-80% in chest to neck deep water.  Viscosity of the water is needed for resistance of strengthening. Water current perturbations provides challenge to standing balance requiring increased core activation.    PATIENT EDUCATION:  Education details: [phone] Person educated: Patient Education method: Explanation, Demonstration, Verbal cues, and Handouts Education comprehension: verbalized understanding and returned demonstration  HOME EXERCISE PROGRAM: Access Code: [email or web address] URL: [email or web address] Date: 01/07/2024 Prepared  by: Glade Pesa  Exercises - Supine Bridge  - 1 x daily - 7 x weekly - 1 sets - 10 reps - Prone Gluteal Sets  - 1 x daily - 7 x weekly - 1 sets - 10 reps - Prone Hip Extension  - 1 x daily - 7 x weekly - 1 sets - 10 reps - Staggered Stance Gluteal Strengthening  - 1 x daily - 7 x weekly - 1 sets - 10 reps - Lateral Step Up with Counter Support  - 1 x daily - 7 x weekly - 1 sets - 10 reps - Standing Hip Abduction AROM  - 1 x daily - 7 x weekly - 1 sets - 10 reps - Kettlebell Deadlift  - 1 x daily - 7 x weekly - 1 sets - 10 reps ASSESSMENT:  CLINICAL IMPRESSION: The patient has met the majority of rehab goals, with noted improvements in pain reduction, strength and functional mobility.  She does continue to have weakness in the gluteus medius noted during stance phase of gait and with single leg standing. She is leaving the state for several months.  We updated her HEP emphasizing gluteal muscle activation exercises and anticipate further improvements over time with regular performance of the program.  Recommend discharge from PT at this time.     Re-cert: Pt had injection by Dr Genelle 11/20/23 and presents at aquatic therapy with additional orders for aquatic PT intervention to begin. Pt presents without SPC. Pt has had at least a 3 week hiatus from therapy. Testing completed.  Results variable.  LEFS has declined steadily since evaluation with her lowest score today.  Likely do to her long duration of care and more exaggerated perception of her lack of ability. Strength testing partly completed with hand dynamometer.  She has obvious deficits in right hip abduction. Gait is with a marked trendelenburg. Pt is encouraged to use SPC when amb to aquatics due to distance and gross limp to improve gait and reduce LBP which she reports is chronic.  She reports she will be leaving for [place]  dec 23 for the remainder of the winter. Plan to progress aquatics for the next few week then will assess  returning to land. She is a good candidate for aquatic intervention and will benefit from the properties of water to progress towards functional goals.      OBJECTIVE IMPAIRMENTS: Abnormal gait, decreased balance, decreased coordination, difficulty walking, decreased strength, increased edema, impaired flexibility, improper body mechanics, postural dysfunction, and pain.   ACTIVITY LIMITATIONS: lifting, bending, squatting, stairs, and locomotion level  PARTICIPATION LIMITATIONS: cleaning, laundry, shopping, community activity, and yard work  PERSONAL FACTORS: Age are also affecting patient's functional outcome.   REHAB POTENTIAL: Excellent  CLINICAL DECISION MAKING: Evolving/moderate complexity  EVALUATION COMPLEXITY: Moderate   GOALS: Goals reviewed with patient? Yes  SHORT TERM GOALS: Target date: 08/09/23  Pt will be ind with initial HEP Baseline: Goal status: Met on 08/01/23  2.  Pt will be educated on movements to avoid to  follow post-op protocol to protect tendon transfer site Baseline:  Goal status: MET  3.  Pt will demo improved Rt hip control in stance phase of gait with single walking stick as needed. Baseline:  Goal status: MET 08/07/23    LONG TERM GOALS: Target date: 01/17/2024   Pt will be ind with advanced HEP Baseline:  Goal status: met 12/9 2.  Pt will improve LEFS score to at least 55/80 to demo improved function Baseline: 44/80 Goal status: not met  3.  Pt will perform 6 min walk test covering at least 700' with walking stick as needed Baseline:  Goal status: MET 08/07/23 with walking stick  4.  Pt will be able to demo squat, lift 10lb, carry 10lb and walk to mimic household chores and yard activities with proper body mechanics without pain. Baseline:  Goal status: partially met  5.  Pt will improve glut strength to at least 4+/5 to improve gait mechanics, stairs and transfers. Baseline:  Goal status: partially met   6.  5x STS improved to 11  sec or less to demo improved functional strength and reduce fall risk. Baseline: 13.23 Goal status:  goal met 12/9    7. Pt will tolerate walking to and from setting and engaging in aquatic therapy session without excessive fatigue or increase in pain to demonstrate improved toleration to activity.  Baseline:  Goal Status: met 12/9   8. Pt will tolerate stair climbing using alternating pattern ascending and descending 6 steps with or without use of handrail   Baseline:  Goal Status: met 12/9   9. Pt will improve strength in right hip abd by at least 5lbs (using HD) to reduce trendelenburg gait pattern and improve function  Baseline: marked pattern  Goal Status: not met  PLAN: PHYSICAL THERAPY DISCHARGE SUMMARY  Visits from Start of Care: 38  Current functional level related to goals / functional outcomes: See clinical impressions above   Remaining deficits: As above   Education / Equipment: HEP   Patient agrees to discharge. Patient goals were partially met. Patient is being discharged due to pt going out of the state for several months and partial goals met.  Glade Pesa, PT 01/07/24 7:21 PM Phone: [phone] Fax: [phone]

## 2024-01-09 ENCOUNTER — Ambulatory Visit (INDEPENDENT_AMBULATORY_CARE_PROVIDER_SITE_OTHER): Admitting: Sports Medicine

## 2024-01-09 ENCOUNTER — Encounter: Payer: Self-pay | Admitting: Sports Medicine

## 2024-01-09 VITALS — BP 136/89 | HR 67 | Temp 98.2°F | Wt 155.0 lb

## 2024-01-09 DIAGNOSIS — R350 Frequency of micturition: Secondary | ICD-10-CM | POA: Diagnosis not present

## 2024-01-09 DIAGNOSIS — N390 Urinary tract infection, site not specified: Secondary | ICD-10-CM | POA: Diagnosis not present

## 2024-01-09 LAB — POC URINALSYSI DIPSTICK (AUTOMATED)
Bilirubin, UA: NEGATIVE
Blood, UA: POSITIVE
Glucose, UA: NEGATIVE
Ketones, UA: NEGATIVE
Nitrite, UA: NEGATIVE
Protein, UA: NEGATIVE
Spec Grav, UA: 1.02 (ref 1.010–1.025)
Urobilinogen, UA: 0.2 U/dL
pH, UA: 5.5 (ref 5.0–8.0)

## 2024-01-09 MED ORDER — NITROFURANTOIN MONOHYD MACRO 100 MG PO CAPS: 100.0000 mg | ORAL_CAPSULE | Freq: Two times a day (BID) | ORAL | 0 refills | Status: AC

## 2024-01-09 NOTE — Progress Notes (Signed)
 New Patient Office Visit  Patient ID: Rebecca Long, Female   DOB: August 24, 1950 73 y.o. MRN: [id number] Subjective:     Discussed the use of AI scribe software for clinical note transcription with the patient, who gave verbal consent to proceed.  History of Present Illness  Rebecca Long is a 73 year old female who presents with  urinary frequency  She has been experiencing frequent urination and a noticeable odor in her urine for the past one to two weeks. No dysuria, hematuria, or lower abdominal pain, except for occasional discomfort in the ovary area. No back pain beyond her normal baseline, nausea, vomiting, fever, chills, dizziness, or lightheadedness.  She recalls a previous urinary tract infection approximately two years ago, treated with antibiotics. She also has a history of a Clostridioides difficile infection following antibiotic use.  Her appetite is described as 'really too good,' and she has not experienced any recent changes in her upper abdominal or chest pain. She recently attended her mother's 95th birthday party in [place] , where she was exposed to a large gathering of people. Following the event, she developed a cold, which she shared with her brother and husband. Her cold symptoms have mostly resolved, with only occasional coughing remaining.   Outpatient Encounter Medications as of 01/09/2024  Medication Sig   aspirin  EC 325 MG tablet Take 1 tablet (325 mg total) by mouth daily.   atorvastatin  (LIPITOR) 20 MG tablet Take 1 tablet (20 mg total) by mouth daily.   Calcium  Carbonate-Vit D-Min (CALCIUM  1200 PO) Take by mouth.   Cholecalciferol (VITAMIN D3) 10 MCG (400 UNIT) CAPS SMARTSIG:1 Capsule(s) By Mouth   FLUoxetine  (PROZAC ) 40 MG capsule Take 1 capsule (40 mg total) by mouth daily.   folic acid  (FOLVITE ) 1 MG tablet Take 1 tablet (1 mg total) by mouth daily.   omeprazole  (PRILOSEC) 40 MG capsule Take 1 capsule (40 mg total) by mouth daily.   oxyCODONE  (ROXICODONE )  5 MG immediate release tablet Take 1 tablet (5 mg total) by mouth every 4 (four) hours as needed for severe pain (pain score 7-10) or breakthrough pain.   telmisartan  (MICARDIS ) 80 MG tablet Take 1 tablet (80 mg total) by mouth daily.   valACYclovir  (VALTREX ) 500 MG tablet Take 500 mg by mouth as needed.    No facility-administered encounter medications on file as of 01/09/2024.    Past Medical History:  Diagnosis Date   Anxiety    Arthritis    Cavus foot, acquired 09/09/2012    bilateral cavus feet the one on the left shows that she has some probable neurogenic weakness with abnormal curvature and in inversion contracture Podiatry 06/2015: H&P and x-ray reviewed with patient. Today I went ahead and I did a proximal nerve block I was able to aspirate the second MPJ and got out amount of clear fluid and injected with a quarter cc dexamethasone  Kenalog  and ap   DDD (degenerative disc disease), lumbar    Depression    Endometrial polyp    GERD (gastroesophageal reflux disease)    Hiatal hernia    Hip flexor tendinitis, right 07/02/2016   History of adenomatous polyp of colon    History of esophageal stricture    History of gastric polyp 11/2017   History of PCR DNA positive for HSV2 11/12/2016   History of primary hyperparathyroidism    s/p  left parathryoidectomy 09-17-2008-- resolved   Hyperlipidemia    Hypertension    Insomnia    Lumbar stenosis  Osteopenia    PONV (postoperative nausea and vomiting)    Schatzki's ring    Scoliosis    Tendinopathy of right biceps tendon 07/02/2016   Thickened endometrium    Uterine fibroid    UTI (urinary tract infection) due to Enterococcus 01/05/2020   Wears glasses     Past Surgical History:  Procedure Laterality Date   BREAST REDUCTION SURGERY Bilateral 2000   COLONOSCOPY     CYST EXCISION  1985   head and rt axilla   DILATATION & CURETTAGE/HYSTEROSCOPY WITH MYOSURE N/A 08/13/2018   Procedure: DILATATION & CURETTAGE/HYSTEROSCOPY  WITH MYOSURE;  Surgeon: Lavoie, Marie-Lyne, MD;  Location: [facility];  Service: Gynecology;  Laterality: N/A;  requests 10:15am in [place] Gyn block request 30 minutes   DILATION AND CURETTAGE OF UTERUS N/A 08/13/2018   ECTOPIC PREGNANCY SURGERY  1981   OPEN SURGICAL REPAIR OF GLUTEAL TENDON Right 07/02/2023   Procedure: REPAIR, TENDON, GLUTEUS MEDIUS, OPEN;  Surgeon: Genelle Standing, MD;  Location: [facility];  Service: Orthopedics;  Laterality: Right;  RIGHT GLUTEUS MAXIMUS TENDON TRANSFER WITH COLLAGEN PATCH AUGMENTATION   PARATHYROIDECTOMY Left 09/17/2008   dr jeoffrey dawn  @[facility]    left superior  (adenoma)   REDUCTION MAMMAPLASTY Bilateral 2000   years ago   SHOULDER SURGERY Right 2007   frozen   UPPER GASTROINTESTINAL ENDOSCOPY  last one 12-18-2017   dr wilhelmenia    Family History  Problem Relation Age of Onset   Breast cancer Mother 85   Heart disease Father 63   Hypertension Sister    Breast cancer Maternal Aunt    Breast cancer Cousin 61   Hypertension Brother    Heart disease Brother    Colon cancer Neg Hx    Esophageal cancer Neg Hx    Stomach cancer Neg Hx    Rectal cancer Neg Hx    Colon polyps Neg Hx     Social History   Socioeconomic History   Marital status: Married    Spouse name: Not on file   Number of children: 1   Years of education: Not on file   Highest education level: Not on file  Occupational History   Not on file  Tobacco Use   Smoking status: Former    Current packs/day: 0.00    Types: Cigarettes    Start date: 01/30/1976    Quit date: 01/30/1991    Years since quitting: 32.9   Smokeless tobacco: Never  Vaping Use   Vaping status: Never Used  Substance and Sexual Activity   Alcohol use: Not Currently   Drug use: Never   Sexual activity: Not Currently    Birth control/protection: Post-menopausal    Comment: 1st intercourse- 17, partners- 10, married- 41 yrs   Other Topics Concern   Not on file  Social  History Narrative   Lives with husband. Feels safe at home. 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 Former smoker. Wears seat belt.    Social Drivers of Health   Tobacco Use: Medium Risk (01/07/2024)   Patient History    Smoking Tobacco Use: Former    Smokeless Tobacco Use: Never    Passive Exposure: Not on file  Financial Resource Strain: Low Risk (04/10/2023)   Overall Financial Resource Strain (CARDIA)    Difficulty of Paying Living Expenses: Not hard at all  Food Insecurity: No Food Insecurity (04/10/2023)   Hunger Vital Sign    Worried About Running Out of Food in the  Last Year: Never true    Ran Out of Food in the Last Year: Never true  Transportation Needs: No Transportation Needs (04/10/2023)   PRAPARE - Administrator, Civil Service (Medical): No    Lack of Transportation (Non-Medical): No  Physical Activity: Inactive (04/10/2023)   Exercise Vital Sign    Days of Exercise per Week: 0 days    Minutes of Exercise per Session: 0 min  Stress: No Stress Concern Present (04/10/2023)   [employer] of Occupational Health - Occupational Stress Questionnaire    Feeling of Stress : Not at all  Social Connections: Moderately Integrated (04/10/2023)   Social Connection and Isolation Panel    Frequency of Communication with Friends and Family: More than three times a week    Frequency of Social Gatherings with Friends and Family: More than three times a week    Attends Religious Services: Never    Database Administrator or Organizations: Yes    Attends Banker Meetings: 1 to 4 times per year    Marital Status: Married  Catering Manager Violence: Not At Risk (04/10/2023)   Humiliation, Afraid, Rape, and Kick questionnaire    Fear of Current or Ex-Partner: No    Emotionally Abused: No    Physically Abused: No    Sexually Abused: No  Depression (PHQ2-9): High Risk (04/10/2023)   Depression (PHQ2-9)    PHQ-2 Score: 13  Alcohol Screen: Low  Risk (04/10/2023)   Alcohol Screen    Last Alcohol Screening Score (AUDIT): 0  Housing: Unknown (04/10/2023)   Housing Stability Vital Sign    Unable to Pay for Housing in the Last Year: No    Number of Times Moved in the Last Year: Not on file    Homeless in the Last Year: No  Utilities: Not At Risk (04/10/2023)   AHC Utilities    Threatened with loss of utilities: No  Health Literacy: Adequate Health Literacy (04/10/2023)   B1300 Health Literacy    Frequency of need for help with medical instructions: Never    Review of Systems  Constitutional:  Negative for chills and fever.  HENT:  Negative for congestion and sore throat.   Respiratory:  Negative for cough, sputum production and shortness of breath.   Cardiovascular:  Negative for chest pain, palpitations and leg swelling.  Gastrointestinal:  Negative for abdominal pain, heartburn and nausea.  Genitourinary:  Positive for frequency and urgency. Negative for dysuria and hematuria.  Musculoskeletal:  Negative for back pain, falls and myalgias.  Neurological:  Negative for dizziness, sensory change and focal weakness.     Objective:    BP 136/89   Pulse 67   Temp 98.2 F (36.8 C) (Oral)   Wt 145 lb (65.8 kg)   SpO2 96%   BMI 25.69 kg/m   Physical Exam Constitutional:      Appearance: Normal appearance.  HENT:     Head: Normocephalic and atraumatic.  Cardiovascular:     Rate and Rhythm: Normal rate and regular rhythm.  Pulmonary:     Effort: Pulmonary effort is normal. No respiratory distress.     Breath sounds: Normal breath sounds. No wheezing.  Abdominal:     General: Bowel sounds are normal. There is no distension.     Tenderness: There is no abdominal tenderness. There is no guarding or rebound.     Comments:    Musculoskeletal:        General: No swelling or tenderness.  Neurological:  Mental Status: She is alert. Mental status is at baseline.     Sensory: No sensory deficit.     Motor: No weakness.      Last CBC Lab Results  Component Value Date   WBC 6.1 04/25/2023   HGB 13.9 04/25/2023   HCT 42.5 04/25/2023   MCV 87.9 04/25/2023   MCH 28.7 10/23/2021   RDW 14.6 04/25/2023   PLT 282.0 04/25/2023   Last metabolic panel Lab Results  Component Value Date   GLUCOSE 98 04/25/2023   NA 141 04/25/2023   K 4.4 04/25/2023   CL 105 04/25/2023   CO2 29 04/25/2023   BUN 17 04/25/2023   CREATININE 0.72 04/25/2023   GFR 82.86 04/25/2023   CALCIUM  9.7 04/25/2023   PROT 6.5 04/25/2023   ALBUMIN 4.3 04/25/2023   BILITOT 0.9 04/25/2023   ALKPHOS 55 04/25/2023   AST 16 04/25/2023   ALT 17 04/25/2023   ANIONGAP 11 10/23/2021   Last lipids Lab Results  Component Value Date   CHOL 188 04/25/2023   HDL 61.90 04/25/2023   LDLCALC 104 (H) 04/25/2023   TRIG 113.0 04/25/2023   CHOLHDL 3 04/25/2023   Last hemoglobin A1c Lab Results  Component Value Date   HGBA1C 5.6 04/25/2023   Last thyroid  functions Lab Results  Component Value Date   TSH 1.95 04/25/2023   Last vitamin D  Lab Results  Component Value Date   25OHVITD2 1 04/30/2014   25OHVITD3 44 04/30/2014   VD25OH 46.97 04/25/2023   Last vitamin B12 and Folate Lab Results  Component Value Date   VITAMINB12 331 04/25/2023   FOLATE 5.3 (L) 04/25/2023       Assessment & Plan:   Assessment & Plan Urinary frequency  Orders:   POCT Urinalysis Dipstick (Automated)   Urine Culture  Urinary tract infection without hematuria, site unspecified Pt c/o urinary frequency  Denies fevers chills Urine dipstick positive Will send macrobid  to her pharmacy  Urine for culture Increase oral hydration Orders:   nitrofurantoin , macrocrystal-monohydrate, (MACROBID ) 100 MG capsule; Take 1 capsule (100 mg total) by mouth 2 (two) times daily for 5 days.      Jackalyn Blazing, MD [facility]

## 2024-01-10 LAB — URINE CULTURE
MICRO NUMBER:: 17343930
SPECIMEN QUALITY:: ADEQUATE

## 2024-01-15 ENCOUNTER — Ambulatory Visit (HOSPITAL_BASED_OUTPATIENT_CLINIC_OR_DEPARTMENT_OTHER): Admitting: Orthopaedic Surgery

## 2024-01-15 DIAGNOSIS — S76011A Strain of muscle, fascia and tendon of right hip, initial encounter: Secondary | ICD-10-CM

## 2024-01-15 NOTE — Progress Notes (Signed)
 Post Operative Evaluation    Procedure/Date of Surgery: Right hip gluteus maximus tendon transfer 6/3  Interval History:    Presents today status post above procedure.  At this time she is having some weakness for which she is continuing to use a cane.   PMH/PSH/Family History/Social History/Meds/Allergies:    Past Medical History:  Diagnosis Date   Anxiety    Arthritis    Cavus foot, acquired 09/09/2012    bilateral cavus feet the one on the left shows that she has some probable neurogenic weakness with abnormal curvature and in inversion contracture Podiatry 06/2015: H&P and x-ray reviewed with patient. Today I went ahead and I did a proximal nerve block I was able to aspirate the second MPJ and got out amount of clear fluid and injected with a quarter cc dexamethasone  Kenalog  and ap   DDD (degenerative disc disease), lumbar    Depression    Endometrial polyp    GERD (gastroesophageal reflux disease)    Hiatal hernia    Hip flexor tendinitis, right 07/02/2016   History of adenomatous polyp of colon    History of esophageal stricture    History of gastric polyp 11/2017   History of PCR DNA positive for HSV2 11/12/2016   History of primary hyperparathyroidism    s/p  left parathryoidectomy 09-17-2008-- resolved   Hyperlipidemia    Hypertension    Insomnia    Lumbar stenosis    Osteopenia    PONV (postoperative nausea and vomiting)    Schatzki's ring    Scoliosis    Tendinopathy of right biceps tendon 07/02/2016   Thickened endometrium    Uterine fibroid    UTI (urinary tract infection) due to Enterococcus 01/05/2020   Wears glasses    Past Surgical History:  Procedure Laterality Date   BREAST REDUCTION SURGERY Bilateral 2000   COLONOSCOPY     CYST EXCISION  1985   head and rt axilla   DILATATION & CURETTAGE/HYSTEROSCOPY WITH MYOSURE N/A 08/13/2018   Procedure: DILATATION & CURETTAGE/HYSTEROSCOPY WITH MYOSURE;  Surgeon: Lavoie,  Marie-Lyne, MD;  Location: [facility];  Service: Gynecology;  Laterality: N/A;  requests 10:15am in [place] Gyn block request 30 minutes   DILATION AND CURETTAGE OF UTERUS N/A 08/13/2018   ECTOPIC PREGNANCY SURGERY  1981   OPEN SURGICAL REPAIR OF GLUTEAL TENDON Right 07/02/2023   Procedure: REPAIR, TENDON, GLUTEUS MEDIUS, OPEN;  Surgeon: Genelle Standing, MD;  Location: [facility];  Service: Orthopedics;  Laterality: Right;  RIGHT GLUTEUS MAXIMUS TENDON TRANSFER WITH COLLAGEN PATCH AUGMENTATION   PARATHYROIDECTOMY Left 09/17/2008   dr jeoffrey dawn  @[facility]    left superior  (adenoma)   REDUCTION MAMMAPLASTY Bilateral 2000   years ago   SHOULDER SURGERY Right 2007   frozen   UPPER GASTROINTESTINAL ENDOSCOPY  last one 12-18-2017   dr wilhelmenia   Social History   Socioeconomic History   Marital status: Married    Spouse name: Not on file   Number of children: 1   Years of education: Not on file   Highest education level: Not on file  Occupational History   Not on file  Tobacco Use   Smoking status: Former    Current packs/day: 0.00    Types: Cigarettes    Start date: 01/30/1976    Quit date:  01/30/1991    Years since quitting: 32.9   Smokeless tobacco: Never  Vaping Use   Vaping status: Never Used  Substance and Sexual Activity   Alcohol use: Not Currently   Drug use: Never   Sexual activity: Not Currently    Birth control/protection: Post-menopausal    Comment: 1st intercourse- 36, partners- 10, married- 41 yrs   Other Topics Concern   Not on file  Social History Narrative   Lives with husband. Feels safe at home. 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 Former smoker. Wears seat belt.    Social Drivers of Health   Tobacco Use: Medium Risk (01/09/2024)   Patient History    Smoking Tobacco Use: Former    Smokeless Tobacco Use: Never    Passive Exposure: Not on file  Financial Resource Strain: Low Risk (04/10/2023)   Overall  Financial Resource Strain (CARDIA)    Difficulty of Paying Living Expenses: Not hard at all  Food Insecurity: No Food Insecurity (04/10/2023)   Hunger Vital Sign    Worried About Running Out of Food in the Last Year: Never true    Ran Out of Food in the Last Year: Never true  Transportation Needs: No Transportation Needs (04/10/2023)   PRAPARE - Administrator, Civil Service (Medical): No    Lack of Transportation (Non-Medical): No  Physical Activity: Inactive (04/10/2023)   Exercise Vital Sign    Days of Exercise per Week: 0 days    Minutes of Exercise per Session: 0 min  Stress: No Stress Concern Present (04/10/2023)   [employer] of Occupational Health - Occupational Stress Questionnaire    Feeling of Stress : Not at all  Social Connections: Moderately Integrated (04/10/2023)   Social Connection and Isolation Panel    Frequency of Communication with Friends and Family: More than three times a week    Frequency of Social Gatherings with Friends and Family: More than three times a week    Attends Religious Services: Never    Database Administrator or Organizations: Yes    Attends Banker Meetings: 1 to 4 times per year    Marital Status: Married  Depression (PHQ2-9): Medium Risk (01/09/2024)   Depression (PHQ2-9)    PHQ-2 Score: 7  Alcohol Screen: Low Risk (04/10/2023)   Alcohol Screen    Last Alcohol Screening Score (AUDIT): 0  Housing: Unknown (04/10/2023)   Housing Stability Vital Sign    Unable to Pay for Housing in the Last Year: No    Number of Times Moved in the Last Year: Not on file    Homeless in the Last Year: No  Utilities: Not At Risk (04/10/2023)   AHC Utilities    Threatened with loss of utilities: No  Health Literacy: Adequate Health Literacy (04/10/2023)   B1300 Health Literacy    Frequency of need for help with medical instructions: Never   Family History  Problem Relation Age of Onset   Breast cancer Mother 33   Heart disease  Father 52   Hypertension Sister    Breast cancer Maternal Aunt    Breast cancer Cousin 70   Hypertension Brother    Heart disease Brother    Colon cancer Neg Hx    Esophageal cancer Neg Hx    Stomach cancer Neg Hx    Rectal cancer Neg Hx    Colon polyps Neg Hx    Allergies  Allergen Reactions   Latex Itching and Rash  Polysporin [Bacitracin-Polymyxin B] Rash   Current Outpatient Medications  Medication Sig Dispense Refill   aspirin  EC 325 MG tablet Take 1 tablet (325 mg total) by mouth daily. 14 tablet 0   atorvastatin  (LIPITOR) 20 MG tablet Take 1 tablet (20 mg total) by mouth daily. 90 tablet 3   Calcium  Carbonate-Vit D-Min (CALCIUM  1200 PO) Take by mouth.     Cholecalciferol (VITAMIN D3) 10 MCG (400 UNIT) CAPS SMARTSIG:1 Capsule(s) By Mouth     FLUoxetine  (PROZAC ) 40 MG capsule Take 1 capsule (40 mg total) by mouth daily. 90 capsule 3   folic acid  (FOLVITE ) 1 MG tablet Take 1 tablet (1 mg total) by mouth daily. 90 tablet 3   omeprazole  (PRILOSEC) 40 MG capsule Take 1 capsule (40 mg total) by mouth daily. 90 capsule 3   oxyCODONE  (ROXICODONE ) 5 MG immediate release tablet Take 1 tablet (5 mg total) by mouth every 4 (four) hours as needed for severe pain (pain score 7-10) or breakthrough pain. 15 tablet 0   telmisartan  (MICARDIS ) 80 MG tablet Take 1 tablet (80 mg total) by mouth daily. 90 tablet 1   valACYclovir  (VALTREX ) 500 MG tablet Take 500 mg by mouth as needed.      No current facility-administered medications for this visit.   No results found.  Review of Systems:   A ROS was performed including pertinent positives and negatives as documented in the HPI.   Musculoskeletal Exam:    There were no vitals taken for this visit.  Right hip incision is well-appearing without erythema or drainage.  Distal neurosensory exam is intact.  Internal/external rotation of the right hip without pain.  There is Trendelenburg gait  Imaging:      I personally reviewed and  interpreted the radiographs.   Assessment:   status post right hip gluteus maximus tendon transfer.  At this time she is having a difficult time with restoring her strength.  I did discuss that ultimately if she has plateaued and is not happy with this we could obtain an MRI to see the status of her glute tendons.  I did briefly discuss the possibility of mobilizing a larger flap although at this time she is going to [place]  and would like to take some time off.  She will send me a message 2 weeks prior to return should she want to consider further MRI of this hip  Plan :    - Return to clinic following return from [place]      I personally saw and evaluated the patient, and participated in the management and treatment plan.  Elspeth Parker, MD Attending Physician, Orthopedic Surgery  This document was dictated using Dragon voice recognition software. A reasonable attempt at proof reading has been made to minimize errors.

## 2024-04-15 ENCOUNTER — Ambulatory Visit: Payer: Self-pay
